# Patient Record
Sex: Male | Born: 1980 | Race: White | Hispanic: No | Marital: Married | Smoking: Never smoker
Health system: Southern US, Community
[De-identification: ages and names within clinical notes are randomized; demographics above are authoritative.]

## PROBLEM LIST (undated history)

## (undated) DIAGNOSIS — Z973 Presence of spectacles and contact lenses: Secondary | ICD-10-CM

## (undated) DIAGNOSIS — N200 Calculus of kidney: Secondary | ICD-10-CM

## (undated) DIAGNOSIS — N201 Calculus of ureter: Secondary | ICD-10-CM

## (undated) DIAGNOSIS — K219 Gastro-esophageal reflux disease without esophagitis: Secondary | ICD-10-CM

## (undated) DIAGNOSIS — Z87442 Personal history of urinary calculi: Secondary | ICD-10-CM

## (undated) DIAGNOSIS — E7201 Cystinuria: Secondary | ICD-10-CM

## (undated) HISTORY — PX: PERCUTANEOUS NEPHROSTOLITHOTOMY: SHX2207

## (undated) HISTORY — PX: OTHER SURGICAL HISTORY: SHX169

---

## 2001-06-16 ENCOUNTER — Ambulatory Visit (HOSPITAL_COMMUNITY): Admission: RE | Admit: 2001-06-16 | Discharge: 2001-06-16 | Payer: Self-pay | Admitting: Urology

## 2001-07-14 ENCOUNTER — Encounter: Payer: Self-pay | Admitting: Urology

## 2001-07-14 ENCOUNTER — Ambulatory Visit (HOSPITAL_COMMUNITY): Admission: RE | Admit: 2001-07-14 | Discharge: 2001-07-14 | Payer: Self-pay | Admitting: Urology

## 2001-12-01 ENCOUNTER — Encounter: Payer: Self-pay | Admitting: Urology

## 2001-12-01 ENCOUNTER — Ambulatory Visit (HOSPITAL_COMMUNITY): Admission: RE | Admit: 2001-12-01 | Discharge: 2001-12-01 | Payer: Self-pay | Admitting: Urology

## 2004-08-07 ENCOUNTER — Ambulatory Visit (HOSPITAL_COMMUNITY): Admission: RE | Admit: 2004-08-07 | Discharge: 2004-08-07 | Payer: Self-pay | Admitting: Urology

## 2016-01-25 ENCOUNTER — Ambulatory Visit: Payer: Self-pay | Admitting: Family Medicine

## 2017-06-10 ENCOUNTER — Emergency Department (HOSPITAL_COMMUNITY): Payer: 59

## 2017-06-10 ENCOUNTER — Other Ambulatory Visit: Payer: Self-pay | Admitting: Urology

## 2017-06-10 ENCOUNTER — Other Ambulatory Visit: Payer: Self-pay

## 2017-06-10 ENCOUNTER — Encounter (HOSPITAL_COMMUNITY): Payer: Self-pay | Admitting: Emergency Medicine

## 2017-06-10 ENCOUNTER — Emergency Department (HOSPITAL_COMMUNITY)
Admission: EM | Admit: 2017-06-10 | Discharge: 2017-06-10 | Disposition: A | Discharge status: Home / Self Care | Payer: 59 | Attending: Physician Assistant | Admitting: Physician Assistant

## 2017-06-10 DIAGNOSIS — R109 Unspecified abdominal pain: Secondary | ICD-10-CM | POA: Diagnosis present

## 2017-06-10 DIAGNOSIS — Z79899 Other long term (current) drug therapy: Secondary | ICD-10-CM | POA: Diagnosis not present

## 2017-06-10 DIAGNOSIS — N200 Calculus of kidney: Secondary | ICD-10-CM | POA: Diagnosis not present

## 2017-06-10 DIAGNOSIS — F17200 Nicotine dependence, unspecified, uncomplicated: Secondary | ICD-10-CM | POA: Diagnosis not present

## 2017-06-10 DIAGNOSIS — N23 Unspecified renal colic: Secondary | ICD-10-CM

## 2017-06-10 LAB — URINALYSIS, ROUTINE W REFLEX MICROSCOPIC
BILIRUBIN URINE: NEGATIVE
GLUCOSE, UA: NEGATIVE mg/dL
HGB URINE DIPSTICK: NEGATIVE
KETONES UR: NEGATIVE mg/dL
Leukocytes, UA: NEGATIVE
NITRITE: NEGATIVE
PH: 6 (ref 5.0–8.0)
Protein, ur: NEGATIVE mg/dL
Specific Gravity, Urine: 1.021 (ref 1.005–1.030)

## 2017-06-10 LAB — CBC WITH DIFFERENTIAL/PLATELET
Basophils Absolute: 0 10*3/uL (ref 0.0–0.1)
Basophils Relative: 0 %
Eosinophils Absolute: 0.1 10*3/uL (ref 0.0–0.7)
Eosinophils Relative: 2 %
HCT: 42.7 % (ref 39.0–52.0)
Hemoglobin: 14.9 g/dL (ref 13.0–17.0)
Lymphocytes Relative: 25 %
Lymphs Abs: 1.2 10*3/uL (ref 0.7–4.0)
MCH: 33.2 pg (ref 26.0–34.0)
MCHC: 34.9 g/dL (ref 30.0–36.0)
MCV: 95.1 fL (ref 78.0–100.0)
Monocytes Absolute: 0.5 10*3/uL (ref 0.1–1.0)
Monocytes Relative: 10 %
Neutro Abs: 3.1 10*3/uL (ref 1.7–7.7)
Neutrophils Relative %: 63 %
Platelets: 177 10*3/uL (ref 150–400)
RBC: 4.49 MIL/uL (ref 4.22–5.81)
RDW: 11.6 % (ref 11.5–15.5)
WBC: 4.9 10*3/uL (ref 4.0–10.5)

## 2017-06-10 LAB — BASIC METABOLIC PANEL
Anion gap: 8 (ref 5–15)
BUN: 16 mg/dL (ref 6–20)
CO2: 26 mmol/L (ref 22–32)
Calcium: 9 mg/dL (ref 8.9–10.3)
Chloride: 106 mmol/L (ref 101–111)
Creatinine, Ser: 0.9 mg/dL (ref 0.61–1.24)
GFR calc Af Amer: >60 mL/min (ref >60)
GFR calc non Af Amer: >60 mL/min (ref >60)
Glucose, Bld: 98 mg/dL (ref 65–99)
Potassium: 4.4 mmol/L (ref 3.5–5.1)
Sodium: 140 mmol/L (ref 135–145)

## 2017-06-10 MED ORDER — HYDROMORPHONE HCL 1 MG/ML IJ SOLN
0.5000 mg | Freq: Once | INTRAMUSCULAR | Status: AC
  Administered 2017-06-10: 0.5 mg via INTRAVENOUS
  Filled 2017-06-10: qty 1

## 2017-06-10 MED ORDER — OXYCODONE-ACETAMINOPHEN 5-325 MG PO TABS: 1.0000 | ORAL_TABLET | ORAL | 0 refills | Status: DC | PRN

## 2017-06-10 MED ORDER — KETOROLAC TROMETHAMINE 30 MG/ML IJ SOLN
30.0000 mg | Freq: Once | INTRAMUSCULAR | Status: AC
  Administered 2017-06-10: 30 mg via INTRAVENOUS
  Filled 2017-06-10: qty 1

## 2017-06-10 MED ORDER — ONDANSETRON HCL 4 MG PO TABS: 4.0000 mg | ORAL_TABLET | Freq: Three times a day (TID) | ORAL | 0 refills | Status: DC | PRN

## 2017-06-10 MED ORDER — ONDANSETRON 8 MG PO TBDP
8.0000 mg | ORAL_TABLET | Freq: Once | ORAL | Status: AC
  Administered 2017-06-10: 8 mg via ORAL
  Filled 2017-06-10: qty 1

## 2017-06-10 NOTE — Discharge Instructions (Signed)
You can take Tylenol or Ibuprofen as directed for pain. You can alternate Tylenol and Ibuprofen every 4 hours. If you take Tylenol at 1pm, then you can take Ibuprofen at 5pm. Then you can take Tylenol again at 9pm.   You can take the pain medication for severe ore breakthrough pain.  Urologist.  Call their office and arrange for an appointment.  Return the emergency department for any fevers, worsening pain, persistent vomiting, inability to eat or drink anything or any other worsening concerning symptoms.

## 2017-06-10 NOTE — ED Provider Notes (Signed)
[facility]-EMERGENCY DEPT Provider Note   CSN: [id number] Arrival date & time: 06/10/17  0605     History   Chief Complaint Chief Complaint  Patient presents with  . Flank Pain    HPI Devin Sanders is a 37 y.o. male [facility] who presents for evaluation of left lower quadrant and left flank pain that began last night.  Patient reports that he has a history of bilateral kidney stones and is followed by [facility] for evaluation.  He was seen 5 days ago for evaluation and is scheduled to have intervention in the next 2 weeks.  Patient reports that he was discharged home with hydrocodone which he states he has been taking as needed for pain.  Patient reports that hydrocodone has not been helping his pain.  He states that his pain worsened on the left side last night.  States that he is also feeling nauseous.  No vomiting.  Patient states that he was told he has a larger stone and has seen fragments of it breaking off.  Patient reports some uncomfortableness with urination but denies any hematuria.  Patient denies any fevers, chest pain, difficulty breathing.  The history is provided by the patient.    History reviewed. No pertinent past medical history.  There are no active problems to display for this patient.   Past Surgical History:  Procedure Laterality Date  . KIDNEY STONE SURGERY          Home Medications    Prior to Admission medications   Medication Sig Start Date End Date Taking? Authorizing Provider  potassium citrate (UROCIT-K) 10 MEQ (1080 MG) SR tablet Take 20 mEq by mouth 2 (two) times daily. 07/23/16 07/23/17 Yes [provider]  tiopronin (THIOLA) 100 MG tablet Take 5 tablets by mouth 2 (two) times daily. 07/23/16 07/23/17 Yes [provider]  ondansetron (ZOFRAN) 4 MG tablet Take 1 tablet (4 mg total) by mouth every 8 (eight) hours as needed for nausea or vomiting. 06/10/17   Maxwell Caul, PA-C    oxyCODONE-acetaminophen (PERCOCET/ROXICET) 5-325 MG tablet Take 1-2 tablets by mouth every 4 (four) hours as needed for severe pain. 06/10/17   Maxwell Caul, PA-C    Family History History reviewed. No pertinent family history.  Social History Social History   Tobacco Use  . Smoking status: Current Every Day Smoker  . Smokeless tobacco: Current User  Substance Use Topics  . Alcohol use: Never    Frequency: Never  . Drug use: Never     Allergies   Mangifera indica and Morphine   Review of Systems Review of Systems  Constitutional: Negative for fever.  Respiratory: Negative for shortness of breath.   Cardiovascular: Negative for chest pain.  Gastrointestinal: Positive for abdominal pain and nausea. Negative for vomiting.  Genitourinary: Positive for flank pain. Negative for dysuria and hematuria.  All other systems reviewed and are negative.    Physical Exam Updated Vital Signs BP (!) 125/96   Pulse 67   Temp 98.9 F (37.2 C) (Oral)   Resp 12   SpO2 95%   Physical Exam  Constitutional: He is oriented to person, place, and time. He appears well-developed and well-nourished.  Sitting comfortably on examination table  HENT:  Head: Normocephalic and atraumatic.  Mouth/Throat: Oropharynx is clear and moist and mucous membranes are normal.  Eyes: Pupils are equal, round, and reactive to light. Conjunctivae, EOM and lids are normal.  Neck: Full passive range of motion  without pain.  Cardiovascular: Normal rate, regular rhythm, normal heart sounds and normal pulses. Exam reveals no gallop and no friction rub.  No murmur heard. Pulmonary/Chest: Effort normal and breath sounds normal.  Lungs clear to auscultation bilaterally.  Symmetric chest rise.  No wheezing, rales, rhonchi.  Abdominal: Soft. Normal appearance. There is tenderness in the left lower quadrant. There is CVA tenderness (Left). There is no rigidity and no guarding.  Abdomen soft, nondistended.   Tenderness noted to the left lower quadrant.  Left-sided CVA tenderness.  Musculoskeletal: Normal range of motion.  Neurological: He is alert and oriented to person, place, and time.  Skin: Skin is warm and dry. Capillary refill takes less than 2 seconds.  Psychiatric: He has a normal mood and affect. His speech is normal.  Nursing note and vitals reviewed.    ED Treatments / Results  Labs (all labs ordered are listed, but only abnormal results are displayed) Labs Reviewed  URINALYSIS, ROUTINE W REFLEX MICROSCOPIC - Abnormal; Notable for the following components:      Result Value   APPearance HAZY (*)    All other components within normal limits  BASIC METABOLIC PANEL  CBC WITH DIFFERENTIAL/PLATELET    EKG None  Radiology Ct Renal Stone Study  Result Date: 06/10/2017 CLINICAL DATA:  Left flank region pain. Patient reports passing recent calculus fragments in urine. EXAM: CT ABDOMEN AND PELVIS WITHOUT CONTRAST TECHNIQUE: Multidetector CT imaging of the abdomen and pelvis was performed following the standard protocol without oral or IV contrast. COMPARISON:  April 07, 2017 FINDINGS: Lower chest: There is bibasilar atelectatic change. No consolidation in the lung bases. Hepatobiliary: No focal liver lesions are evident on this noncontrast enhanced study. Gallbladder wall is not appreciably thickened. There is no biliary duct dilatation. Pancreas: There is no pancreatic mass or inflammatory focus. Spleen: No splenic lesions are evident. Adrenals/Urinary Tract: Adrenals bilaterally appear normal. There is no appreciable mass or hydronephrosis on either side. There is mild scarring in the lower pole of the left kidney. On the right, there are several 1-2 mm calculi in the upper pole region. There is a calculus in the lower pole region on the right measuring 7 x 5 mm with an adjacent 4 x 3 mm calculus in the lower pole right kidney. On the left, there is a calculus in the upper pole region  measuring 11 x 4 mm with adjacent 4 x 4 mm calculus. There are several 1-2 mm calculi in the mid left kidney. In the lower pole left kidney, there is a calculus measuring 1.0 x 0.9 cm. There are also occasional tiny calculi elsewhere in the lower pole of the left kidney. There are no evident ureteral calculi on either side. Urinary bladder is midline with wall thickness within normal limits. Stomach/Bowel: There is no appreciable bowel wall or mesenteric thickening. No evident bowel obstruction. No free air or portal venous air. Vascular/Lymphatic: There is no abdominal aortic aneurysm. No vascular lesions are. No adenopathy is appreciable in the abdomen or pelvis. Reproductive: Prostate and seminal vesicles are normal in size and contour. There is no evident pelvic mass. Other: Appendix appears normal. No abscess or ascites is evident in the abdomen or pelvis. There is a small ventral hernia containing only fat. There is fat in each inguinal ring. Musculoskeletal: There are no blastic or lytic bone lesions. There is no intramuscular lesion evident. IMPRESSION: 1. Multiple intrarenal calculi bilaterally. No ureteral calculus or hydronephrosis on either side. 2. No bowel obstruction.  No evident abscess in the abdomen pelvis. Appendix appears normal. 3. Small ventral hernia containing only fat. Fat noted in each inguinal ring. Electronically Signed   By: Bretta Bang III M.D.   On: 06/10/2017 09:52    Procedures Procedures (including critical care time)  Medications Ordered in ED Medications  ketorolac (TORADOL) 30 MG/ML injection 30 mg (30 mg Intravenous Given 06/10/17 0744)  ondansetron (ZOFRAN-ODT) disintegrating tablet 8 mg (8 mg Oral Given 06/10/17 0842)  HYDROmorphone (DILAUDID) injection 0.5 mg (0.5 mg Intravenous Given 06/10/17 0844)  HYDROmorphone (DILAUDID) injection 0.5 mg (0.5 mg Intravenous Given 06/10/17 1101)     Initial Impression / Assessment and Plan / ED Course  I have reviewed the  triage vital signs and the nursing notes.  Pertinent labs & imaging results that were available during my care of the patient were reviewed by me and considered in my medical decision making (see chart for details).     37 y.o. M with PMH/o kidney stones who presents for evaluation of left lower quadrant pain and flank pain.  History of kidney stones and has been in pain but states worse last night.  Seen by urology 5 days ago and given hydrocodone which he states he has been taking has not been improving his pain.  No fevers.  Reports nausea but no vomiting. Patient is afebrile, non-toxic appearing, sitting comfortably on examination table. Vital signs reviewed and stable.  On exam, patient is tender in the left lower quadrant and left CVA.  Plan to check urine.  Will check basic labs to evaluate kidney function.  Analgesics provided in the department.  BMP unremarkable.  UA shows no infectious etiology.  CBC without any significant leukocytosis or anemia.  CT scan shows multiple stones within the kidney themselves.  No evidence of obstructing kidney stone.  No evidence of hydronephrosis.  CT study otherwise unremarkable.  I discussed results with patient.  On reevaluation, he states his pain is now on 8/10.  He has had one round of Toradol and 1 round of Dilaudid.  Patient still slightly tender on the left side.  We will plan an additional small dose of pain medication.  Patient reviewed on PMP.  Has multiple narcotic prescriptions.  Review of records show that most of this is related to his kidney stones.  Most of the prescriptions are from urology.  Question if there is some drug-seeking behavior associated with his symptoms though he does have extensive renal disease.  I discussed with him the expectation of controlling his pain.  Given his extensive kidney stones, will be able to slightly improve his pain but will not be able to come controlled altogether.  Patient is understandable of  this.  Reevaluation.  Patient reports some improvement in pain.  Repeat abdominal symptoms improvement in tenderness.  We will plan to p.o. challenge patient in the department.  Tolerated p.o. without any difficulties.  No vomiting here in the department.  While patient does have a significant amount of narcotic prescriptions, he does have significant stone disease.  We will plan to give just a small amount of Percocet since he feels like the hydrocodone is not working to help with pain.  Instructed him that I can only give him a small dose to help with pain and that until he is seen by urology, most likely be a temporary solution to his pain.  Patient understands.  Encourage urology follow-up in the next 2 to 4 days for further evaluation. Patient had  ample opportunity for questions and discussion. All patient's questions were answered with full understanding. Strict return precautions discussed. Patient expresses understanding and agreement to plan.    Final Clinical Impressions(s) / ED Diagnoses   Final diagnoses:  Renal colic    ED Discharge Orders        Ordered    ondansetron (ZOFRAN) 4 MG tablet  Every 8 hours PRN     06/10/17 1137    oxyCODONE-acetaminophen (PERCOCET/ROXICET) 5-325 MG tablet  Every 4 hours PRN     06/10/17 1137       Maxwell Caul, PA-C 06/10/17 1622    Mackuen, Cindee Salt, MD 06/11/17 [phone]

## 2017-06-10 NOTE — ED Triage Notes (Signed)
Pt reports left flank pain onset x1 week Hx kidney stones and states has been fragments since last friday

## 2017-06-22 ENCOUNTER — Encounter (HOSPITAL_BASED_OUTPATIENT_CLINIC_OR_DEPARTMENT_OTHER): Payer: Self-pay | Admitting: *Deleted

## 2017-06-22 ENCOUNTER — Other Ambulatory Visit: Payer: Self-pay

## 2017-06-22 NOTE — Progress Notes (Addendum)
Spoke w/ pt via phone for pre-op interview.  Npo after mn.  Arrive at 0530.  Needs istat 8.  Will take am meds dos w/ sips of water and if needed takes oxycodone/ zofran.

## 2017-06-26 ENCOUNTER — Ambulatory Visit (HOSPITAL_BASED_OUTPATIENT_CLINIC_OR_DEPARTMENT_OTHER): Payer: 59 | Admitting: Anesthesiology

## 2017-06-26 ENCOUNTER — Encounter (HOSPITAL_BASED_OUTPATIENT_CLINIC_OR_DEPARTMENT_OTHER)
Admission: RE | Disposition: A | Discharge status: Home / Self Care | Payer: Self-pay | Source: Ambulatory Visit | Attending: Urology

## 2017-06-26 ENCOUNTER — Other Ambulatory Visit: Payer: Self-pay

## 2017-06-26 ENCOUNTER — Encounter (HOSPITAL_BASED_OUTPATIENT_CLINIC_OR_DEPARTMENT_OTHER): Payer: Self-pay

## 2017-06-26 ENCOUNTER — Ambulatory Visit (HOSPITAL_BASED_OUTPATIENT_CLINIC_OR_DEPARTMENT_OTHER)
Admission: RE | Admit: 2017-06-26 | Discharge: 2017-06-26 | Disposition: A | Discharge status: Home / Self Care | Payer: 59 | Source: Ambulatory Visit | Attending: Urology | Admitting: Urology

## 2017-06-26 DIAGNOSIS — Z79899 Other long term (current) drug therapy: Secondary | ICD-10-CM | POA: Insufficient documentation

## 2017-06-26 DIAGNOSIS — N200 Calculus of kidney: Secondary | ICD-10-CM | POA: Diagnosis not present

## 2017-06-26 DIAGNOSIS — K219 Gastro-esophageal reflux disease without esophagitis: Secondary | ICD-10-CM | POA: Insufficient documentation

## 2017-06-26 HISTORY — DX: Gastro-esophageal reflux disease without esophagitis: K21.9

## 2017-06-26 HISTORY — PX: CYSTOSCOPY WITH RETROGRADE PYELOGRAM, URETEROSCOPY AND STENT PLACEMENT: SHX5789

## 2017-06-26 HISTORY — PX: HOLMIUM LASER APPLICATION: SHX5852

## 2017-06-26 HISTORY — DX: Personal history of urinary calculi: Z87.442

## 2017-06-26 HISTORY — DX: Calculus of kidney: N20.0

## 2017-06-26 HISTORY — DX: Presence of spectacles and contact lenses: Z97.3

## 2017-06-26 LAB — POCT I-STAT, CHEM 8
BUN: 21 mg/dL — ABNORMAL HIGH (ref 6–20)
Calcium, Ion: 1.32 mmol/L (ref 1.15–1.40)
Chloride: 104 mmol/L (ref 101–111)
Creatinine, Ser: 0.8 mg/dL (ref 0.61–1.24)
Glucose, Bld: 109 mg/dL — ABNORMAL HIGH (ref 65–99)
HEMATOCRIT: 39 % (ref 39.0–52.0)
HEMOGLOBIN: 13.3 g/dL (ref 13.0–17.0)
POTASSIUM: 3.8 mmol/L (ref 3.5–5.1)
Sodium: 141 mmol/L (ref 135–145)
TCO2: 23 mmol/L (ref 22–32)

## 2017-06-26 SURGERY — CYSTOURETEROSCOPY, WITH RETROGRADE PYELOGRAM AND STENT INSERTION
Anesthesia: General | Site: Renal | Laterality: Bilateral

## 2017-06-26 MED ORDER — PROPOFOL 10 MG/ML IV BOLUS
INTRAVENOUS | Status: DC | PRN
  Administered 2017-06-26: 200 mg via INTRAVENOUS

## 2017-06-26 MED ORDER — SODIUM CHLORIDE 0.9 % IR SOLN
Status: DC | PRN
  Administered 2017-06-26: 3000 mL

## 2017-06-26 MED ORDER — SUGAMMADEX SODIUM 200 MG/2ML IV SOLN
INTRAVENOUS | Status: AC
  Filled 2017-06-26: qty 2

## 2017-06-26 MED ORDER — DIPHENHYDRAMINE HCL 50 MG/ML IJ SOLN
INTRAMUSCULAR | Status: AC
  Filled 2017-06-26: qty 1

## 2017-06-26 MED ORDER — KETOROLAC TROMETHAMINE 10 MG PO TABS: 10.0000 mg | ORAL_TABLET | Freq: Three times a day (TID) | ORAL | 0 refills | Status: DC | PRN

## 2017-06-26 MED ORDER — HYDROMORPHONE HCL 1 MG/ML IJ SOLN
INTRAMUSCULAR | Status: AC
  Filled 2017-06-26: qty 1

## 2017-06-26 MED ORDER — ONDANSETRON HCL 4 MG/2ML IJ SOLN
INTRAMUSCULAR | Status: AC
  Filled 2017-06-26: qty 2

## 2017-06-26 MED ORDER — OXYCODONE-ACETAMINOPHEN 5-325 MG PO TABS
ORAL_TABLET | ORAL | Status: AC
  Filled 2017-06-26: qty 1

## 2017-06-26 MED ORDER — MIDAZOLAM HCL 2 MG/2ML IJ SOLN
0.5000 mg | Freq: Once | INTRAMUSCULAR | Status: DC | PRN
  Filled 2017-06-26: qty 2

## 2017-06-26 MED ORDER — SUCCINYLCHOLINE CHLORIDE 200 MG/10ML IV SOSY
PREFILLED_SYRINGE | INTRAVENOUS | Status: DC | PRN
  Administered 2017-06-26: 120 mg via INTRAVENOUS

## 2017-06-26 MED ORDER — ROCURONIUM BROMIDE 10 MG/ML (PF) SYRINGE
PREFILLED_SYRINGE | INTRAVENOUS | Status: DC | PRN
  Administered 2017-06-26: 50 mg via INTRAVENOUS
  Administered 2017-06-26: 20 mg via INTRAVENOUS

## 2017-06-26 MED ORDER — DEXAMETHASONE SODIUM PHOSPHATE 10 MG/ML IJ SOLN
INTRAMUSCULAR | Status: DC | PRN
  Administered 2017-06-26: 10 mg via INTRAVENOUS

## 2017-06-26 MED ORDER — FENTANYL CITRATE (PF) 100 MCG/2ML IJ SOLN
INTRAMUSCULAR | Status: AC
  Filled 2017-06-26: qty 2

## 2017-06-26 MED ORDER — DEXAMETHASONE SODIUM PHOSPHATE 10 MG/ML IJ SOLN
INTRAMUSCULAR | Status: AC
  Filled 2017-06-26: qty 1

## 2017-06-26 MED ORDER — KETOROLAC TROMETHAMINE 30 MG/ML IJ SOLN
INTRAMUSCULAR | Status: DC | PRN
  Administered 2017-06-26: 30 mg via INTRAVENOUS

## 2017-06-26 MED ORDER — PROMETHAZINE HCL 25 MG/ML IJ SOLN
6.2500 mg | INTRAMUSCULAR | Status: DC | PRN
  Filled 2017-06-26: qty 1

## 2017-06-26 MED ORDER — KETOROLAC TROMETHAMINE 30 MG/ML IJ SOLN
INTRAMUSCULAR | Status: AC
  Filled 2017-06-26: qty 1

## 2017-06-26 MED ORDER — MIDAZOLAM HCL 2 MG/2ML IJ SOLN
INTRAMUSCULAR | Status: AC
  Filled 2017-06-26: qty 2

## 2017-06-26 MED ORDER — OXYCODONE-ACETAMINOPHEN 5-325 MG PO TABS
1.0000 | ORAL_TABLET | Freq: Four times a day (QID) | ORAL | Status: DC | PRN
  Administered 2017-06-26: 1 via ORAL
  Filled 2017-06-26: qty 2

## 2017-06-26 MED ORDER — DIPHENHYDRAMINE HCL 50 MG/ML IJ SOLN
12.5000 mg | Freq: Once | INTRAMUSCULAR | Status: AC
  Administered 2017-06-26: 12.5 mg via INTRAVENOUS
  Filled 2017-06-26: qty 0.25

## 2017-06-26 MED ORDER — SUGAMMADEX SODIUM 200 MG/2ML IV SOLN
INTRAVENOUS | Status: DC | PRN
  Administered 2017-06-26: 200 mg via INTRAVENOUS

## 2017-06-26 MED ORDER — GENTAMICIN SULFATE 40 MG/ML IJ SOLN
430.0000 mg | INTRAMUSCULAR | Status: AC
  Administered 2017-06-26: 430 mg via INTRAVENOUS
  Filled 2017-06-26: qty 10.75

## 2017-06-26 MED ORDER — SUCCINYLCHOLINE CHLORIDE 200 MG/10ML IV SOSY
PREFILLED_SYRINGE | INTRAVENOUS | Status: AC
  Filled 2017-06-26: qty 10

## 2017-06-26 MED ORDER — OXYCODONE-ACETAMINOPHEN 5-325 MG PO TABS: 1.0000 | ORAL_TABLET | Freq: Four times a day (QID) | ORAL | 0 refills | Status: DC | PRN

## 2017-06-26 MED ORDER — FENTANYL CITRATE (PF) 100 MCG/2ML IJ SOLN
INTRAMUSCULAR | Status: DC | PRN
  Administered 2017-06-26 (×4): 50 ug via INTRAVENOUS

## 2017-06-26 MED ORDER — ROCURONIUM BROMIDE 100 MG/10ML IV SOLN
INTRAVENOUS | Status: AC
  Filled 2017-06-26: qty 1

## 2017-06-26 MED ORDER — LACTATED RINGERS IV SOLN
INTRAVENOUS | Status: DC
  Administered 2017-06-26 (×2): via INTRAVENOUS
  Filled 2017-06-26: qty 1000

## 2017-06-26 MED ORDER — ONDANSETRON HCL 4 MG/2ML IJ SOLN
INTRAMUSCULAR | Status: DC | PRN
  Administered 2017-06-26: 4 mg via INTRAVENOUS

## 2017-06-26 MED ORDER — CEPHALEXIN 500 MG PO CAPS: 500.0000 mg | ORAL_CAPSULE | Freq: Two times a day (BID) | ORAL | 0 refills | Status: DC

## 2017-06-26 MED ORDER — HYDROMORPHONE HCL 1 MG/ML IJ SOLN
0.2500 mg | INTRAMUSCULAR | Status: DC | PRN
  Administered 2017-06-26 (×2): 0.25 mg via INTRAVENOUS
  Filled 2017-06-26: qty 0.5

## 2017-06-26 MED ORDER — LIDOCAINE 2% (20 MG/ML) 5 ML SYRINGE
INTRAMUSCULAR | Status: DC | PRN
  Administered 2017-06-26: 100 mg via INTRAVENOUS

## 2017-06-26 MED ORDER — PROPOFOL 10 MG/ML IV BOLUS
INTRAVENOUS | Status: AC
  Filled 2017-06-26: qty 40

## 2017-06-26 MED ORDER — MEPERIDINE HCL 25 MG/ML IJ SOLN
6.2500 mg | INTRAMUSCULAR | Status: DC | PRN
Start: 2017-06-26 — End: 2017-06-26
  Filled 2017-06-26: qty 1

## 2017-06-26 MED ORDER — MIDAZOLAM HCL 2 MG/2ML IJ SOLN
INTRAMUSCULAR | Status: DC | PRN
  Administered 2017-06-26: 2 mg via INTRAVENOUS

## 2017-06-26 MED ORDER — IOHEXOL 300 MG/ML  SOLN
INTRAMUSCULAR | Status: DC | PRN
  Administered 2017-06-26: 30 mL

## 2017-06-26 MED ORDER — LIDOCAINE 2% (20 MG/ML) 5 ML SYRINGE
INTRAMUSCULAR | Status: AC
  Filled 2017-06-26: qty 5

## 2017-06-26 SURGICAL SUPPLY — 29 items
BAG DRAIN URO-CYSTO SKYTR STRL (DRAIN) ×2 IMPLANT
BAG DRN UROCATH (DRAIN) ×1
BASKET LASER NITINOL 1.9FR (BASKET) ×1 IMPLANT
BSKT STON RTRVL 120 1.9FR (BASKET) ×1
CATH INTERMIT  6FR 70CM (CATHETERS) ×1 IMPLANT
CLOTH BEACON ORANGE TIMEOUT ST (SAFETY) ×2 IMPLANT
FIBER LASER FLEXIVA 365 (UROLOGICAL SUPPLIES) IMPLANT
FIBER LASER TRAC TIP (UROLOGICAL SUPPLIES) ×1 IMPLANT
GLOVE BIO SURGEON STRL SZ7.5 (GLOVE) ×2 IMPLANT
GLOVE BIOGEL PI IND STRL 7.0 (GLOVE) IMPLANT
GLOVE BIOGEL PI INDICATOR 7.0 (GLOVE) ×1
GLOVE INDICATOR 7.0 STRL GRN (GLOVE) ×1 IMPLANT
GOWN STRL REUS W/TWL LRG LVL3 (GOWN DISPOSABLE) ×3 IMPLANT
GUIDEWIRE ANG ZIPWIRE 038X150 (WIRE) ×2 IMPLANT
GUIDEWIRE STR DUAL SENSOR (WIRE) ×3 IMPLANT
INFUSOR MANOMETER BAG 3000ML (MISCELLANEOUS) ×1 IMPLANT
IV NS 1000ML (IV SOLUTION)
IV NS 1000ML BAXH (IV SOLUTION) ×1 IMPLANT
IV NS IRRIG 3000ML ARTHROMATIC (IV SOLUTION) ×2 IMPLANT
KIT TURNOVER CYSTO (KITS) ×2 IMPLANT
MANIFOLD NEPTUNE II (INSTRUMENTS) ×2 IMPLANT
NS IRRIG 500ML POUR BTL (IV SOLUTION) ×4 IMPLANT
PACK CYSTO (CUSTOM PROCEDURE TRAY) ×2 IMPLANT
SHEATH URET ACCESS 12FR/35CM (UROLOGICAL SUPPLIES) ×1 IMPLANT
STENT POLARIS 5FRX26 (STENTS) ×2 IMPLANT
SYR 10ML LL (SYRINGE) ×2 IMPLANT
TUBE CONNECTING 12X1/4 (SUCTIONS) IMPLANT
TUBE FEEDING 8FR 16IN STR KANG (MISCELLANEOUS) IMPLANT
TUBING UROLOGY SET (TUBING) ×1 IMPLANT

## 2017-06-26 NOTE — Anesthesia Postprocedure Evaluation (Signed)
Anesthesia Post Note  Patient: Devin Sanders  Procedure(s) Performed: CYSTOSCOPY WITH RETROGRADE PYELOGRAM, URETEROSCOPY, STONE BASKETRY  AND STENT PLACEMENT (Bilateral Renal) HOLMIUM LASER APPLICATION (Bilateral Renal)     Patient location during evaluation: PACU Anesthesia Type: General Level of consciousness: awake and alert, oriented and patient cooperative Pain management: pain level controlled Vital Signs Assessment: post-procedure vital signs reviewed and stable Respiratory status: spontaneous breathing, nonlabored ventilation and respiratory function stable Cardiovascular status: blood pressure returned to baseline and stable Postop Assessment: no apparent nausea or vomiting Anesthetic complications: no    Last Vitals:  Vitals:   06/26/17 0945 06/26/17 1110  BP: 119/86 (!) 126/92  Pulse: 75 97  Resp: 14 16  Temp:  36.6 C  SpO2: 93% 98%    Last Pain:  Vitals:   06/26/17 1130  TempSrc:   PainSc: 4                  Bernice Mullin,E. Liylah Najarro

## 2017-06-26 NOTE — Op Note (Signed)
NAME: Devin, Sanders MEDICAL RECORD [id number] ACCOUNT [id number] DATE OF BIRTH:February 05, 1980 FACILITY: [facility] LOCATION: [facility] PHYSICIAN:Devin Georgia Tresa Moore, MD  OPERATIVE REPORT  DATE OF PROCEDURE:  06/26/2017  PREOPERATIVE DIAGNOSIS:  Bilateral renal stones cystinuria.  PROCEDURES PERFORMED:   1.  Bilateral cystoscopy, bilateral pyelograms, interpretation. 2.  Bilateral ureteroscopy with laser lithotripsy. 3.  Insertion of bilateral ureteral stents 5 x 26 Polaris with tether.  ESTIMATED BLOOD LOSS:  Nil.  COMPLICATIONS:  No complications.  DRAINS:  None.  SPECIMENS:  Bilateral renal stones for discarded.  FINDINGS: 1.  Bilateral papillary tip calcifications, total volume approximately 1 cm2 each side. 2.  Partial left mid calix infundibular stenosis.  This was a laser endopyelotomy and stone behind this and this was cleared. 3.  Distal placement bilateral ureteral stents proximally renal pelvis, distally in the bladder.  INDICATIONS:  The patient is a very pleasant and very compliant 37 year old gentleman with a history of severe cystinuria.  He is on maximal medical therapy.  He has had innumerable surgeries in the past related to this including percutaneous stone  surgery.  He is now on a protocol of periodic surveillance and preemptive ureteroscopy for ipsilateral stone volume greater than 1 cm.  He has met this criteria.  He has a vacation planned in several months and wished to be stone free prior and we agreed  that bilateral ureteroscopy was clearly warranted with the goal of stone free.  Informed consent was obtained and placed in medical record.  DESCRIPTION OF PROCEDURE:  The patient being Devin Sanders verified, procedure being bilateral ureteral stent with stimulation was confirmed.  Procedure timeout was performed and intravenous antibiotics administered.  General endotracheal anesthesia  was induced.  The patient was placed into a low lithotomy position.  A  sterile field was created by prepping and draping the patient's penis, perineum and proximal thighs using iodine.  Cystourethroscopy was performed with a 22-French rigid cystoscope  with offset lens.  Inspection of the anterior and posterior urethra were unremarkable.  Inspection of bladder revealed no diverticula, calcifications, lesions.  There was some crystal area noted consistent with known cystinuria.  The left ureteral  orifice was cannulated with a 6-French ureteral catheter and left retrograde pyelogram was obtained.  Left retrograde pyelogram demonstrated a single left ureter with single system left kidney.  No filling defects or narrowing noted.  A 0.038 ZIPwire was advanced to the lower pole, set aside as a safety wire.  Similarly, right retrograde pyelogram was  obtained.  Right retrograde pyelogram demonstrated a single right ureter, single system right kidney.  No filling defects or narrowing noted.  A 0.038 ZIPwire was advanced to lower pole set aside as a safety wire.  An 8-French feeding tube placed in the urinary  bladder with pressure released and a semirigid ureteroscopy was performed of the distal 4/5 of the right ureter alongside a separate sensor working wire and no mucosal abnormalities were found.  Then similar radiographs, were performed of the distal left  ureter in the distal 4/5 of it.  No mucosal abnormalities were found.  The semirigid scope was then exchanged for a 12 x 14, 38 cm ureteral access sheath to the level of the proximal left ureter using continuous fluoroscopic guidance and flexible  digital ureteroscopy performed the proximal left ureter and systematic inspection of the left kidney, including all calices x3.  There were multifocal papillary tip calcifications, mostly in the upper pole.  These were amenable to basket extraction using  an escape  basket and they were removed, set aside for discard.  There was a midpole, very narrowed scarified infundibulum and  laser endopyelotomy was performed of this using settings of 1 joule and 10 Hz with a noncontact of bleeding technique and this  infundibular scarring was opened up and there was quite a dominant calcification within this calix.  This was then ablated using settings of 0.3 joules and 30 Hz using a dusting technique and the stone was completely ablated into fragments less than 130  mm.  Following this maneuver, there was excellent hemostasis, complete resolution of all fragments larger than 130 mm.  We have achieved the goals of the left-sided surgery.  The access sheath was removed under continuous vision and no mucosal abnormalities were found  The  access sheath was then placed over the right sensor working wire to the level of the right proximal ureter using the continuous fluoroscopic guidance.  A flexible digital ureteroscopy was performed of the proximal right ureter.  A systematic inspection  of the right kidney removing all calices x3.  There was a small upper pole papillary tip calcification and 2 free floating lower pole calcifications.  Lower pole calcifications were larger. They were grasped in an escape basket and repositioned into the  upper pole calix and ablated using a dusting technique as per the left side.  Several smaller dominant fragments were generated.  These were removed using escape basket.  Following these maneuver, excellent hemostasis and complete resolution of all stone  fragments larger than 130 mm.  Access sheath was removed under continuous vision.  Given the bilateral nature of the procedure today, it was felt that brief interval stenting would be warranted with tethered stents.  As such, a new 5 x 26 Polaris type  stent was placed over the safety wire on the right side using fluoroscopic guidance.  Good proximal and distal point were noted.  Similarly, a separate 5 x 26 stent was placed over the left-sided safety wire using fluoroscopic guidance.  Good proximal  and distal  point were noted.  Tether was left in place x2, fashioned to the dorsum of the penis.  Procedure terminated.    The patient tolerated the procedure well and no immediate complications.  The patient was taken to [facility] in stable condition.  AN/NUANCE  D:06/26/2017 T:06/26/2017 JOB:[id number]

## 2017-06-26 NOTE — Transfer of Care (Signed)
  Last Vitals:  Vitals Value Taken Time  BP 125/85 06/26/2017  9:00 AM  Temp 36.6 C 06/26/2017  8:58 AM  Pulse 84 06/26/2017  9:05 AM  Resp 13 06/26/2017  9:05 AM  SpO2 96 % 06/26/2017  9:05 AM  Vitals shown include unvalidated device data.  Last Pain:  Vitals:   06/26/17 0858  TempSrc:   PainSc: 7       Patients Stated Pain Goal: 4 (06/26/17 [id number]) Immediate Anesthesia Transfer of Care Note  Patient: Devin Sanders  Procedure(s) Performed: Procedure(s) (LRB): CYSTOSCOPY WITH RETROGRADE PYELOGRAM, URETEROSCOPY, STONE BASKETRY  AND STENT PLACEMENT (Bilateral) HOLMIUM LASER APPLICATION (Bilateral)  Patient Location: PACU  Anesthesia Type: General  Level of Consciousness: awake, alert  and oriented  Airway & Oxygen Therapy: Patient Spontanous Breathing and Patient connected to nasal cannula oxygen  Post-op Assessment: Report given to PACU RN and Post -op Vital signs reviewed and stable  Post vital signs: Reviewed and stable  Complications: No apparent anesthesia complications

## 2017-06-26 NOTE — Anesthesia Procedure Notes (Signed)
Procedure Name: Intubation Date/Time: 06/26/2017 7:38 AM Performed by: Suan Halter, CRNA Pre-anesthesia Checklist: Patient identified, Emergency Drugs available, Suction available and Patient being monitored Patient Re-evaluated:Patient Re-evaluated prior to induction Oxygen Delivery Method: Circle system utilized Preoxygenation: Pre-oxygenation with 100% oxygen Induction Type: IV induction Ventilation: Mask ventilation without difficulty Laryngoscope Size: Mac and 3 Grade View: Grade I Tube type: Oral Tube size: 7.5 mm Number of attempts: 1 Airway Equipment and Method: Stylet and Oral airway Placement Confirmation: ETT inserted through vocal cords under direct vision,  positive ETCO2 and breath sounds checked- equal and bilateral Secured at: 23 cm Tube secured with: Tape Dental Injury: Teeth and Oropharynx as per pre-operative assessment

## 2017-06-26 NOTE — Discharge Instructions (Signed)
1 - You may have urinary urgency (bladder spasms) and bloody urine on / off with stent in place. This is normal. ° °2 - Remove tethered stent on Monday morning at home by pulling on string, then blue-white plastic tubing, and discarding. Office is open Monday if any problems arise.  ° °3 - Call MD or go to ER for fever >102, severe pain / nausea / vomiting not relieved by medications, or acute change in medical status ° °[facility] °[phone] °Post Ureteroscopy With or Without Stent Instructions ° °Definitions: ° °Ureter: The duct that transports urine from the kidney to the bladder. °Stent:   A plastic hollow tube that is placed into the ureter, from the kidney to the                 bladder to prevent the ureter from swelling shut. ° °GENERAL INSTRUCTIONS: ° °Despite the fact that no skin incisions were used, the area around the ureter and bladder is raw and irritated. The stent is a foreign body which will further irritate the bladder wall. This irritation is manifested by increased frequency of urination, both day and night, and by an increase in the urge to urinate. In some, the urge to urinate is present almost always. Sometimes the urge is strong enough that you may not be able to stop yourself from urinating. The only real cure is to remove the stent and then give time for the bladder wall to heal which can't be done until the danger of the ureter swelling shut has passed, which varies. ° °You may see some blood in your urine while the stent is in place and a few days afterwards. Do not be alarmed, even if the urine was clear for a while. Get off your feet and drink lots of fluids until clearing occurs. If you start to pass clots or don't improve, call us. ° °DIET: °You may return to your normal diet immediately. Because of the raw surface of your bladder, alcohol, spicy foods, acid type foods and drinks with caffeine may cause irritation or frequency and should be used in moderation. To  keep your urine flowing freely and to avoid constipation, drink plenty of fluids during the day ( 8-10 glasses ). °Tip: Avoid cranberry juice because it is very acidic. ° °ACTIVITY: °Your physical activity doesn't need to be restricted. However, if you are very active, you may see some blood in your urine. We suggest that you reduce your activity under these circumstances until the bleeding has stopped. ° °BOWELS: °It is important to keep your bowels regular during the postoperative period. Straining with bowel movements can cause bleeding. A bowel movement every other day is reasonable. Use a mild laxative if needed, such as Milk of Magnesia 2-3 tablespoons, or 2 Dulcolax tablets. Call if you continue to have problems. If you have been taking narcotics for pain, before, during or after your surgery, you may be constipated. Take a laxative if necessary. ° ° °MEDICATION: °You should resume your pre-surgery medications unless told not to. In addition you will often be given an antibiotic to prevent infection. These should be taken as prescribed until the bottles are finished unless you are having an unusual reaction to one of the drugs. ° °PROBLEMS YOU SHOULD REPORT TO US: °· Fevers over 100.5 Fahrenheit. °· Heavy bleeding, or clots ( See above notes about blood in urine ). °· Inability to urinate. °· Drug reactions ( hives, rash, nausea, vomiting, diarrhea ). °·   Severe burning or pain with urination that is not improving. ° °FOLLOW-UP: °You will need a follow-up appointment to monitor your progress. Call for this appointment at the number listed above. Usually the first appointment will be about three to fourteen days after your surgery. ° ° °Post Anesthesia Home Care Instructions ° °Activity: °Get plenty of rest for the remainder of the day. A responsible individual must stay with you for 24 hours following the procedure.  °For the next 24 hours, DO NOT: °-Drive a car °-Operate machinery °-Drink alcoholic  beverages °-Take any medication unless instructed by your physician °-Make any legal decisions or sign important papers. ° °Meals: °Start with liquid foods such as gelatin or soup. Progress to regular foods as tolerated. Avoid greasy, spicy, heavy foods. If nausea and/or vomiting occur, drink only clear liquids until the nausea and/or vomiting subsides. Call your physician if vomiting continues. ° °Special Instructions/Symptoms: °Your throat may feel dry or sore from the anesthesia or the breathing tube placed in your throat during surgery. If this causes discomfort, gargle with warm salt water. The discomfort should disappear within 24 hours. ° °If you had a scopolamine patch placed behind your ear for the management of post- operative nausea and/or vomiting: ° °1. The medication in the patch is effective for 72 hours, after which it should be removed.  Wrap patch in a tissue and discard in the trash. Wash hands thoroughly with soap and water. °2. You may remove the patch earlier than 72 hours if you experience unpleasant side effects which may include dry mouth, dizziness or visual disturbances. °3. Avoid touching the patch. Wash your hands with soap and water after contact with the patch. ° °

## 2017-06-26 NOTE — H&P (Signed)
Devin Sanders is an 37 y.o. male.    Chief Complaint: Pre-op BILATERAL Ureteroscopic Stone Manipulaiton  HPI:   1 - Recurrent Nephrolithiasis -  Pre 2019 - PCNL several each side, [facility].  02/2017 - left URS for 1.5cm stone to stone reduction (not stone free) [facility] per OR notes  06/2017 - Lt > Rt scattere non-obstructing renal sotnes, abotu 1cm vol each side.   2 - Cysteinuria - ON thiola 500mg  BID / day PLUS K-Cit 2000MEQ BID meals for cysteinuria. CMP, CBC 02/2017 normal, Cr 0.9.   PMH sig for mild obesity. 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 His PCP is Vernon Preyon Moore MD.    Today "Devin Sanders" is seen to proceed with BILATERAL ureteroscopic stone manipulation with goal of stone free prior to planned vacations next month. Most recent UCX negaive.    Past Medical History:  Diagnosis Date  . GERD (gastroesophageal reflux disease)   . History of kidney stones   . Renal calculi    bilateral   . Wears glasses     Past Surgical History:  Procedure Laterality Date  . PERCUTANEOUS NEPHROSTOLITHOTOMY  2003;  2009;  06[facility]22-[facility]  . URETEROSCOPIC STONE MANIPULATION UNILATERAL  multiple since 1997--  last one 02-19-2017  [facility] by [email or web address]    History reviewed. No pertinent family history. Social History:  reports that he has never smoked. He has never used smokeless tobacco. He reports that he does not drink alcohol or use drugs.  Allergies:  Allergies  Allergen Reactions  . Morphine Nausea And Vomiting  . Other Hives and Swelling    MANGO'S    Medications Prior to Admission  Medication Sig Dispense Refill  . famotidine (PEPCID AC) 10 MG chewable tablet Chew 10 mg by mouth 2 (two) times daily as needed for heartburn.    Marland Kitchen. HYDROCODONE BITARTRATE PO Take 325 mg by mouth.    . ondansetron (ZOFRAN) 4 MG tablet Take 1 tablet (4 mg total) by mouth every 8 (eight) hours as needed for nausea or vomiting. 4 tablet  0  . oxyCODONE-acetaminophen (PERCOCET/ROXICET) 5-325 MG tablet Take 1-2 tablets by mouth every 4 (four) hours as needed for severe pain. 4 tablet 0  . potassium citrate (UROCIT-K) 10 MEQ (1080 MG) SR tablet Take 20 mEq by mouth 2 (two) times daily.    Marland Kitchen. tiopronin (THIOLA) 100 MG tablet Take 5 tablets by mouth 2 (two) times daily.      Results for orders placed or performed during the hospital encounter of 06/26/17 (from the past 48 hour(s))  I-STAT, chem 8     Status: Abnormal   Collection Time: 06/26/17  6:17 AM  Result Value Ref Range   Sodium 141 135 - 145 mmol/L   Potassium 3.8 3.5 - 5.1 mmol/L   Chloride 104 101 - 111 mmol/L   BUN 21 (H) 6 - 20 mg/dL   Creatinine, Ser 1.610.80 0.61 - 1.24 mg/dL   Glucose, Bld 096109 (H) 65 - 99 mg/dL   Calcium, Ion 0.451.32 4.091.15 - 1.40 mmol/L   TCO2 23 22 - 32 mmol/L   Hemoglobin 13.3 13.0 - 17.0 g/dL   HCT 81.139.0 91.439.0 - 78.252.0 %   No results found.  Review of Systems  Constitutional: Negative.  Negative for chills and fever.  HENT: Negative.   Eyes: Negative.   Respiratory: Negative.   Cardiovascular: Negative.   Gastrointestinal: Negative.  Genitourinary: Negative.   Musculoskeletal: Negative.   Skin: Negative.   Neurological: Negative.   Endo/Heme/Allergies: Negative.     Blood pressure (!) 114/98, pulse 75, temperature 97.7 F (36.5 C), temperature source Oral, resp. rate 18, height 5\' 10"  (1.778 m), weight 104.1 kg (229 lb 6.4 oz), SpO2 99 %. Physical Exam  HENT:  Head: Normocephalic.  Eyes: Pupils are equal, round, and reactive to light.  Neck: Normal range of motion.  Cardiovascular: Normal rate.  Respiratory: Effort normal.  GI: Soft.  Genitourinary:  Genitourinary Comments: Prior flank scars.   Musculoskeletal: Normal range of motion.  Neurological: He is alert.  Skin: Skin is warm.  Psychiatric: He has a normal mood and affect.     Assessment/Plan  Proceed as planned with BILATERAL ureteroscopic stone manipulation. Risks,  benefits, alternatives, expected peri-op course discussed preiovulsy and reiterated today.   [name], MD 06/26/2017, 6:54 AM

## 2017-06-26 NOTE — Anesthesia Preprocedure Evaluation (Addendum)
Anesthesia Evaluation  Patient identified by MRN, date of birth, ID band Patient awake    Reviewed: Allergy & Precautions, NPO status , Patient's Chart, lab work & pertinent test results  History of Anesthesia Complications Negative for: history of anesthetic complications  Airway Mallampati: II  TM Distance: >3 FB Neck ROM: Full    Dental  (+) Dental Advisory Given   Pulmonary neg pulmonary ROS,    breath sounds clear to auscultation       Cardiovascular negative cardio ROS   Rhythm:Regular Rate:Normal     Neuro/Psych negative neurological ROS     GI/Hepatic Neg liver ROS, GERD  Medicated and Controlled,Nauseated today, last emesis 4-5d ago with stones   Endo/Other  Morbid obesity  Renal/GU stones     Musculoskeletal   Abdominal (+) + obese,   Peds  Hematology negative hematology ROS (+)   Anesthesia Other Findings   Reproductive/Obstetrics                            Anesthesia Physical Anesthesia Plan  ASA: II  Anesthesia Plan: General   Post-op Pain Management:    Induction:   PONV Risk Score and Plan: 2 and Ondansetron and Dexamethasone  Airway Management Planned: Oral ETT  Additional Equipment:   Intra-op Plan:   Post-operative Plan: Extubation in OR  Informed Consent: I have reviewed the patients History and Physical, chart, labs and discussed the procedure including the risks, benefits and alternatives for the proposed anesthesia with the patient or authorized representative who has indicated his/her understanding and acceptance.   Dental advisory given  Plan Discussed with: CRNA and Surgeon  Anesthesia Plan Comments: (Plan routine monitors, GETA)        Anesthesia Quick Evaluation

## 2017-06-26 NOTE — Brief Op Note (Signed)
06/26/2017  8:47 AM  PATIENT:  Devin Sanders  37 y.o. male  PRE-OPERATIVE DIAGNOSIS:  BILATERAL RENAL STONES  POST-OPERATIVE DIAGNOSIS:  BILATERAL RENAL STONES  PROCEDURE:  Procedure(s): CYSTOSCOPY WITH RETROGRADE PYELOGRAM, URETEROSCOPY, STONE BASKETRY  AND STENT PLACEMENT (Bilateral) HOLMIUM LASER APPLICATION (Bilateral)  SURGEON:  Surgeon(s) and Role:    [name]* Alison Kubicki, MD - Primary  PHYSICIAN ASSISTANT:   ASSISTANTS: none   ANESTHESIA:   general  EBL:  minimal   BLOOD ADMINISTERED:none  DRAINS: none   LOCAL MEDICATIONS USED:  NONE  SPECIMEN:  Source of Specimen:  bilateral renal stone fragments  DISPOSITION OF SPECIMEN:  discard  COUNTS:  YES  TOURNIQUET:  * No tourniquets in log *  DICTATION: .Other Dictation: Dictation Number [id number]  PLAN OF CARE: Discharge to home after PACU  PATIENT DISPOSITION:  PACU - hemodynamically stable.   Delay start of Pharmacological VTE agent (>24hrs) due to surgical blood loss or risk of bleeding: yes

## 2017-06-29 ENCOUNTER — Other Ambulatory Visit: Payer: Self-pay

## 2017-06-29 ENCOUNTER — Emergency Department (HOSPITAL_COMMUNITY)
Admission: EM | Admit: 2017-06-29 | Discharge: 2017-06-29 | Disposition: A | Discharge status: Home / Self Care | Payer: 59 | Attending: Emergency Medicine | Admitting: Emergency Medicine

## 2017-06-29 ENCOUNTER — Encounter (HOSPITAL_COMMUNITY): Payer: Self-pay | Admitting: Emergency Medicine

## 2017-06-29 DIAGNOSIS — R109 Unspecified abdominal pain: Secondary | ICD-10-CM | POA: Diagnosis not present

## 2017-06-29 DIAGNOSIS — G8918 Other acute postprocedural pain: Secondary | ICD-10-CM | POA: Diagnosis present

## 2017-06-29 DIAGNOSIS — Z9889 Other specified postprocedural states: Secondary | ICD-10-CM | POA: Diagnosis not present

## 2017-06-29 DIAGNOSIS — Z79899 Other long term (current) drug therapy: Secondary | ICD-10-CM | POA: Insufficient documentation

## 2017-06-29 LAB — CBC
HEMATOCRIT: 39.9 % (ref 39.0–52.0)
HEMOGLOBIN: 13.8 g/dL (ref 13.0–17.0)
MCH: 32.9 pg (ref 26.0–34.0)
MCHC: 34.6 g/dL (ref 30.0–36.0)
MCV: 95.2 fL (ref 78.0–100.0)
Platelets: 224 10*3/uL (ref 150–400)
RBC: 4.19 MIL/uL — ABNORMAL LOW (ref 4.22–5.81)
RDW: 11.6 % (ref 11.5–15.5)
WBC: 7.3 10*3/uL (ref 4.0–10.5)

## 2017-06-29 LAB — URINALYSIS, ROUTINE W REFLEX MICROSCOPIC
BACTERIA UA: NONE SEEN
Bilirubin Urine: NEGATIVE
Glucose, UA: NEGATIVE mg/dL
KETONES UR: NEGATIVE mg/dL
Leukocytes, UA: NEGATIVE
Nitrite: NEGATIVE
Protein, ur: 100 mg/dL — AB
RBC / HPF: >50 RBC/hpf — ABNORMAL HIGH (ref 0–5)
SPECIFIC GRAVITY, URINE: 1.015 (ref 1.005–1.030)
pH: 6 (ref 5.0–8.0)

## 2017-06-29 LAB — BASIC METABOLIC PANEL
ANION GAP: 8 (ref 5–15)
BUN: 19 mg/dL (ref 6–20)
CHLORIDE: 107 mmol/L (ref 101–111)
CO2: 25 mmol/L (ref 22–32)
Calcium: 8.8 mg/dL — ABNORMAL LOW (ref 8.9–10.3)
Creatinine, Ser: 0.97 mg/dL (ref 0.61–1.24)
GFR calc non Af Amer: >60 mL/min (ref >60)
GLUCOSE: 102 mg/dL — AB (ref 65–99)
POTASSIUM: 3.8 mmol/L (ref 3.5–5.1)
Sodium: 140 mmol/L (ref 135–145)

## 2017-06-29 MED ORDER — ONDANSETRON HCL 4 MG/2ML IJ SOLN
4.0000 mg | Freq: Once | INTRAMUSCULAR | Status: AC
  Administered 2017-06-29: 4 mg via INTRAVENOUS
  Filled 2017-06-29: qty 2

## 2017-06-29 MED ORDER — OXYCODONE-ACETAMINOPHEN 5-325 MG PO TABS: 1.0000 | ORAL_TABLET | Freq: Four times a day (QID) | ORAL | 0 refills | Status: DC | PRN

## 2017-06-29 MED ORDER — HYDROMORPHONE HCL 1 MG/ML IJ SOLN
0.5000 mg | Freq: Once | INTRAMUSCULAR | Status: AC
  Administered 2017-06-29: 0.5 mg via INTRAVENOUS
  Filled 2017-06-29: qty 1

## 2017-06-29 MED ORDER — KETOROLAC TROMETHAMINE 15 MG/ML IJ SOLN
15.0000 mg | Freq: Once | INTRAMUSCULAR | Status: AC
Start: 2017-06-29 — End: 2017-06-29
  Administered 2017-06-29: 15 mg via INTRAVENOUS
  Filled 2017-06-29: qty 1

## 2017-06-29 MED ORDER — ONDANSETRON HCL 4 MG PO TABS: 4.0000 mg | ORAL_TABLET | Freq: Three times a day (TID) | ORAL | 0 refills | Status: DC | PRN

## 2017-06-29 NOTE — ED Provider Notes (Signed)
[facility]-EMERGENCY DEPT Provider Note   CSN: [id number] Arrival date & time: 06/29/17  [id number]     History   Chief Complaint Chief Complaint  Patient presents with  . Post-op Problem    HPI Devin Sanders is a 37 y.o. male.  HPI   Patient is a 37 year old male with a history of renal calculi and GERD presenting for postoperative pain and bleeding after removal of his ureteral stents.  Patient reports that he had bilateral ureteral stents placed on 06-26-2017, he was otherwise recovering well over the weekend, however he took them out per scheduled around 11:30 PM last night, and subsequently developed worsening colicky left flank pain and lower quadrant pain, as well as passage of large clots of blood per the urethra.  Patient reports he is still urinating, however he feels that his stream is lessening.  Patient denies any fever or chills.  Patient does report he is been unable to keep oral analgesia down due to nausea vomiting, and estimates approximately 10 times emesis in the past 12 hours.  Past Medical History:  Diagnosis Date  . GERD (gastroesophageal reflux disease)   . History of kidney stones   . Renal calculi    bilateral   . Wears glasses     There are no active problems to display for this patient.   Past Surgical History:  Procedure Laterality Date  . CYSTOSCOPY WITH RETROGRADE PYELOGRAM, URETEROSCOPY AND STENT PLACEMENT Bilateral 06/26/2017   Procedure: CYSTOSCOPY WITH RETROGRADE PYELOGRAM, URETEROSCOPY, STONE BASKETRY  AND STENT PLACEMENT;  Surgeon: Sebastian AcheManny, Theodore, MD;  [facility] [place];  Service: Urology;  Laterality: Bilateral;  . HOLMIUM LASER APPLICATION Bilateral 06/26/2017   Procedure: HOLMIUM LASER APPLICATION;  Surgeon: Sebastian AcheManny, Theodore, MD;  [facility] [place];  Service: Urology;  Laterality: Bilateral;  . PERCUTANEOUS NEPHROSTOLITHOTOMY  2003;  2009;  06-22-2014  [facility]  . URETEROSCOPIC STONE  MANIPULATION UNILATERAL  multiple since 1997--  last one 02-19-2017  @ [facility] by dr [email or web address]        Home Medications    Prior to Admission medications   Medication Sig Start Date End Date Taking? Authorizing Provider  cephALEXin (KEFLEX) 500 MG capsule Take 1 capsule (500 mg total) by mouth 2 (two) times daily. X 3 days to prevent infection with tethered stent 06/26/17   Sebastian AcheManny, Theodore, MD  famotidine (PEPCID AC) 10 MG chewable tablet Chew 10 mg by mouth 2 (two) times daily as needed for heartburn.    [provider]  ketorolac (TORADOL) 10 MG tablet Take 1 tablet (10 mg total) by mouth every 8 (eight) hours as needed for moderate pain. Or stent discomfort post-operatively. Pt has tolerated prior. 06/26/17   Sebastian AcheManny, Theodore, MD  ondansetron (ZOFRAN) 4 MG tablet Take 1 tablet (4 mg total) by mouth every 8 (eight) hours as needed for nausea or vomiting. 06/10/17   Maxwell CaulLayden, Lindsey A, PA-C  oxyCODONE-acetaminophen (PERCOCET/ROXICET) 5-325 MG tablet Take 1-2 tablets by mouth every 6 (six) hours as needed for severe pain. Post-operatively 06/26/17   Sebastian AcheManny, Theodore, MD  potassium citrate (UROCIT-K) 10 MEQ (1080 MG) SR tablet Take 20 mEq by mouth 2 (two) times daily. 07/23/16 07/23/17  [provider]  tiopronin (THIOLA) 100 MG tablet Take 5 tablets by mouth 2 (two) times daily. 07/23/16 07/23/17  [provider]    Family History History reviewed. No pertinent family history.  Social History Social History   Tobacco Use  . Smoking status:  Never Smoker  . Smokeless tobacco: Never Used  Substance Use Topics  . Alcohol use: Never    Frequency: Never  . Drug use: Never     Allergies   Morphine and Other   Review of Systems Review of Systems  Constitutional: Negative for chills and fever.  Gastrointestinal: Positive for abdominal pain, nausea and vomiting.  Genitourinary: Positive for decreased urine volume, dysuria, flank pain and hematuria. Negative for  frequency and urgency.  All other systems reviewed and are negative.    Physical Exam Updated Vital Signs BP (!) 151/109   Pulse 93   Temp 98 F (36.7 C) (Oral)   Resp 14   Ht 5\' 10"  (1.778 m)   Wt 103.9 kg (229 lb)   SpO2 94%   BMI 32.86 kg/m   Physical Exam  Constitutional: He appears well-developed and well-nourished. No distress.  HENT:  Head: Normocephalic and atraumatic.  Mouth/Throat: Oropharynx is clear and moist.  Eyes: Pupils are equal, round, and reactive to light. Conjunctivae and EOM are normal.  Neck: Normal range of motion. Neck supple.  Cardiovascular: Normal rate, regular rhythm, S1 normal and S2 normal.  No murmur heard. Pulmonary/Chest: Effort normal and breath sounds normal. He has no wheezes. He has no rales.  Abdominal: Soft. He exhibits no distension. There is tenderness. There is no guarding.  +CVA TTP; + LLQ tenderness without guarding or rebound.  Musculoskeletal: Normal range of motion. He exhibits no edema or deformity.  Lymphadenopathy:    He has no cervical adenopathy.  Neurological: He is alert.  Cranial nerves grossly intact. Patient moves extremities symmetrically and with good coordination.  Skin: Skin is warm and dry. No rash noted. No erythema.  Psychiatric: He has a normal mood and affect. His behavior is normal. Judgment and thought content normal.  Nursing note and vitals reviewed.    ED Treatments / Results  Labs (all labs ordered are listed, but only abnormal results are displayed) Labs Reviewed  BASIC METABOLIC PANEL - Abnormal; Notable for the following components:      Result Value   Glucose, Bld 102 (*)    Calcium 8.8 (*)    All other components within normal limits  CBC - Abnormal; Notable for the following components:   RBC 4.19 (*)    All other components within normal limits  URINALYSIS, ROUTINE W REFLEX MICROSCOPIC - Abnormal; Notable for the following components:   Color, Urine RED (*)    APPearance HAZY (*)      Hgb urine dipstick LARGE (*)    Protein, ur 100 (*)    RBC / HPF >50 (*)    All other components within normal limits    EKG None  Radiology No results found.  Procedures Procedures (including critical care time)  Medications Ordered in ED Medications  HYDROmorphone (DILAUDID) injection 0.5 mg (0.5 mg Intravenous Given 06/29/17 0659)  ondansetron (ZOFRAN) injection 4 mg (4 mg Intravenous Given 06/29/17 0659)     Initial Impression / Assessment and Plan / ED Course  I have reviewed the triage vital signs and the nursing notes.  Pertinent labs & imaging results that were available during my care of the patient were reviewed by me and considered in my medical decision making (see chart for details).  Clinical Course as of Jun 29 1008  Mon Jun 29, 2017  0750 Normal renal function. No leukocytosis.    [AM]  [id number] Per discussion with Dr. Vernie Ammons of urology, patient is having expected pain  of removal of stents, and could be passing parts of the stones that were surgically manipulated.  Patient should be treated with typical kidney stone therapy with antiemetics, analgesia, and Flomax.  Did not feel that further imaging would be prudent at this time. Patient to follow-up per previously scheduled appointment with urology.   [AM]  1003 I have reviewed the patient's information in the [place] Controlled Substance Database for the past 12 months and found them to have last Rx on 6/21 for 2 days. Additional 1 day Rx given for change in acute postoperative pain.  Opiates were prescribed for an acute, painful condition. The patient was given information on side effects and encouraged to use other, non-opiate pain medication primary, only using opiate medicine sparingly for severe pain.   [AM]    Clinical Course User Index [AM] Elisha Ponder, PA-C    Patient is nontoxic-appearing, afebrile, and in no acute distress at rest.  Patient with recurrent flank and lower abdominal pain  that he describes as identical to prior nephrolithiasis pain in the setting of removal of stents.  Pending normal renal function and hemoglobin, will consult urology.  Patient with improved pain reassessments.  Patient was informed of the plan of care as suggested by urology.  Patient provided return precautions for any intractable nausea or vomiting, worsening pain, or obstructive urination.  Patient is in understanding and agrees with the plan of care.  Final Clinical Impressions(s) / ED Diagnoses   Final diagnoses:  Left flank pain  History of removal of ureteral stent    ED Discharge Orders        Ordered    ondansetron (ZOFRAN) 4 MG tablet  Every 8 hours PRN     06/29/17 1006    oxyCODONE-acetaminophen (PERCOCET/ROXICET) 5-325 MG tablet  Every 6 hours PRN     06/29/17 1006       Elisha Ponder, PA-C 06/29/17 1011    Little, Ambrose Finland, MD 06/30/17 [phone]

## 2017-06-29 NOTE — ED Triage Notes (Signed)
Patient is complaining of blood clots and pain after pulling stents last night.

## 2017-06-29 NOTE — Discharge Instructions (Signed)
Please read and follow all provided instructions.  Your diagnoses today include:  1. Left flank pain   2. History of removal of ureteral stent     Tests performed today include: Urine test that showed blood in your urine and no infection Blood test that showed normal kidney function Vital signs. See below for your results today.   Medications prescribed:   Take any prescribed medications only as directed.  You have been prescribed Percocet for pain. This is an opioid pain medication. You may take this medication every 4-6 hours as needed for pain. Only take this medication if you need it for breakthrough pain. You may combine this medicine with ibuprofen, a non-steroidal anti-inflammatory drug (NSAID) every 6 hours, so you are getting something for pain relief every 3 hours.  Do not combine this medication with Tylenol, as it may increase the risk of liver problems.  Do not combine this medication with alcohol.  Please be advised to avoid driving or operating heavy machinery while taking this medication, as it may make you drowsy or impair judgment.   Please take your Flomax daily.  Please take Zofran oral disintegrating tablet every 6-8 hours as needed for nausea and vomiting.  Home care instructions:  Follow any educational materials contained in this packet.  BE VERY CAREFUL not to take multiple medicines containing Tylenol (also called acetaminophen). Doing so can lead to an overdose which can damage your liver and cause liver failure and possibly death.   Follow-up instructions: Please follow-up with your urologist or the urologist referral (provided on front page) in the next 1 week for further evaluation of your symptoms.  If you need to return to the Emergency Department, [facility] to [facility] and not [facility]. The [facility]rologists are [facility] at [facility] and can better care for you at this location.  Return instructions:  If you need to return to the  Emergency Department, go to [facility] and [facility]. The [facility]rologists are located at [facility] and can better care for you at this location.  Please return to the Emergency Department if you experience worsening symptoms.  Please return if you develop fever or uncontrolled pain or vomiting. Please return for any  Please return if you have any other emergent concerns.  Additional Information:  Your vital signs today were: BP (!) 157/108    Pulse 86    Temp 98 F (36.7 C) (Oral)    Resp 14    Ht 5\' 10"  (1.778 m)    Wt 103.9 kg (229 lb)    SpO2 95%    BMI 32.86 kg/m  If your blood pressure (BP) was elevated above 135/85 this visit, please have this repeated by your doctor within one month. --------------

## 2017-09-17 ENCOUNTER — Emergency Department (HOSPITAL_COMMUNITY)
Admission: EM | Admit: 2017-09-17 | Discharge: 2017-09-17 | Disposition: A | Discharge status: Home / Self Care | Payer: 59 | Source: Home / Self Care | Attending: Emergency Medicine | Admitting: Emergency Medicine

## 2017-09-17 ENCOUNTER — Emergency Department (HOSPITAL_COMMUNITY): Payer: 59

## 2017-09-17 DIAGNOSIS — N201 Calculus of ureter: Secondary | ICD-10-CM

## 2017-09-17 DIAGNOSIS — Z6833 Body mass index (BMI) 33.0-33.9, adult: Secondary | ICD-10-CM | POA: Diagnosis not present

## 2017-09-17 DIAGNOSIS — N202 Calculus of kidney with calculus of ureter: Secondary | ICD-10-CM | POA: Diagnosis present

## 2017-09-17 LAB — COMPREHENSIVE METABOLIC PANEL
ALBUMIN: 4.6 g/dL (ref 3.5–5.0)
ALK PHOS: 64 U/L (ref 38–126)
ALT: 21 U/L (ref 0–44)
ALT: 22 U/L (ref 0–44)
ANION GAP: 11 (ref 5–15)
AST: 24 U/L (ref 15–41)
AST: 23 U/L (ref 15–41)
Albumin: 4.5 g/dL (ref 3.5–5.0)
Alkaline Phosphatase: 60 U/L (ref 38–126)
Anion gap: 10 (ref 5–15)
BUN: 21 mg/dL — ABNORMAL HIGH (ref 6–20)
BUN: 22 mg/dL — AB (ref 6–20)
CALCIUM: 9.3 mg/dL (ref 8.9–10.3)
CALCIUM: 9.5 mg/dL (ref 8.9–10.3)
CO2: 25 mmol/L (ref 22–32)
CO2: 25 mmol/L (ref 22–32)
CREATININE: 1.57 mg/dL — AB (ref 0.61–1.24)
CREATININE: 1.62 mg/dL — AB (ref 0.61–1.24)
Chloride: 105 mmol/L (ref 98–111)
Chloride: 107 mmol/L (ref 98–111)
GFR calc Af Amer: >60 mL/min (ref >60)
GFR calc non Af Amer: 55 mL/min — ABNORMAL LOW (ref >60)
GFR calc non Af Amer: 53 mL/min — ABNORMAL LOW (ref >60)
GLUCOSE: 126 mg/dL — AB (ref 70–99)
Glucose, Bld: 132 mg/dL — ABNORMAL HIGH (ref 70–99)
Potassium: 4.2 mmol/L (ref 3.5–5.1)
Potassium: 4.3 mmol/L (ref 3.5–5.1)
SODIUM: 141 mmol/L (ref 135–145)
SODIUM: 142 mmol/L (ref 135–145)
Total Bilirubin: 0.6 mg/dL (ref 0.3–1.2)
Total Bilirubin: 0.9 mg/dL (ref 0.3–1.2)
Total Protein: 7.5 g/dL (ref 6.5–8.1)
Total Protein: 7.7 g/dL (ref 6.5–8.1)

## 2017-09-17 LAB — CBC WITH DIFFERENTIAL/PLATELET
BASOS PCT: 0 %
Basophils Absolute: 0 10*3/uL (ref 0.0–0.1)
EOS PCT: 1 %
Eosinophils Absolute: 0.1 10*3/uL (ref 0.0–0.7)
HEMATOCRIT: 41.9 % (ref 39.0–52.0)
Hemoglobin: 14.6 g/dL (ref 13.0–17.0)
Lymphocytes Relative: 8 %
Lymphs Abs: 1 10*3/uL (ref 0.7–4.0)
MCH: 33 pg (ref 26.0–34.0)
MCHC: 34.8 g/dL (ref 30.0–36.0)
MCV: 94.8 fL (ref 78.0–100.0)
MONO ABS: 0.7 10*3/uL (ref 0.1–1.0)
MONOS PCT: 6 %
NEUTROS ABS: 10.8 10*3/uL — AB (ref 1.7–7.7)
Neutrophils Relative %: 85 %
Platelets: 267 10*3/uL (ref 150–400)
RBC: 4.42 MIL/uL (ref 4.22–5.81)
RDW: 11.7 % (ref 11.5–15.5)
WBC: 12.6 10*3/uL — ABNORMAL HIGH (ref 4.0–10.5)

## 2017-09-17 LAB — URINALYSIS, ROUTINE W REFLEX MICROSCOPIC
BACTERIA UA: NONE SEEN
Bilirubin Urine: NEGATIVE
Glucose, UA: NEGATIVE mg/dL
KETONES UR: NEGATIVE mg/dL
Leukocytes, UA: NEGATIVE
NITRITE: NEGATIVE
PH: 5 (ref 5.0–8.0)
Protein, ur: 30 mg/dL — AB
RBC / HPF: >50 RBC/hpf — ABNORMAL HIGH (ref 0–5)
Specific Gravity, Urine: 1.028 (ref 1.005–1.030)

## 2017-09-17 LAB — CBC
HCT: 42.8 % (ref 39.0–52.0)
HEMOGLOBIN: 14.5 g/dL (ref 13.0–17.0)
MCH: 32.2 pg (ref 26.0–34.0)
MCHC: 33.9 g/dL (ref 30.0–36.0)
MCV: 94.9 fL (ref 78.0–100.0)
Platelets: 278 10*3/uL (ref 150–400)
RBC: 4.51 MIL/uL (ref 4.22–5.81)
RDW: 11.7 % (ref 11.5–15.5)
WBC: 12.9 10*3/uL — ABNORMAL HIGH (ref 4.0–10.5)

## 2017-09-17 LAB — LIPASE, BLOOD: Lipase: 38 U/L (ref 11–51)

## 2017-09-17 MED ORDER — KETOROLAC TROMETHAMINE 15 MG/ML IJ SOLN
15.0000 mg | Freq: Once | INTRAMUSCULAR | Status: AC
  Administered 2017-09-17: 15 mg via INTRAVENOUS
  Filled 2017-09-17: qty 1

## 2017-09-17 MED ORDER — KETOROLAC TROMETHAMINE 15 MG/ML IJ SOLN: 30.0000 mg | Freq: Once | INTRAMUSCULAR | Status: DC

## 2017-09-17 MED ORDER — OXYCODONE-ACETAMINOPHEN 5-325 MG PO TABS
2.0000 | ORAL_TABLET | Freq: Once | ORAL | Status: AC
  Administered 2017-09-17: 2 via ORAL
  Filled 2017-09-17: qty 2

## 2017-09-17 MED ORDER — HYDROMORPHONE HCL 1 MG/ML IJ SOLN
INTRAMUSCULAR | Status: AC
  Administered 2017-09-17: 1 mg
  Filled 2017-09-17: qty 1

## 2017-09-17 MED ORDER — ONDANSETRON HCL 4 MG PO TABS: 4.0000 mg | ORAL_TABLET | Freq: Three times a day (TID) | ORAL | 0 refills | Status: DC | PRN

## 2017-09-17 MED ORDER — OXYCODONE-ACETAMINOPHEN 5-325 MG PO TABS: 1.0000 | ORAL_TABLET | Freq: Four times a day (QID) | ORAL | 0 refills | Status: DC | PRN

## 2017-09-17 MED ORDER — ONDANSETRON HCL 4 MG/2ML IJ SOLN
INTRAMUSCULAR | Status: AC
  Administered 2017-09-17: 4 mg
  Filled 2017-09-17: qty 2

## 2017-09-17 MED ORDER — TAMSULOSIN HCL 0.4 MG PO CAPS: 0.4000 mg | ORAL_CAPSULE | Freq: Every day | ORAL | 0 refills | Status: DC

## 2017-09-17 NOTE — ED Provider Notes (Signed)
  Physical Exam  BP (!) 134/102   Pulse 91   Temp 98.2 F (36.8 C) (Oral)   Resp 18   Ht 5\' 10"  (1.778 m)   Wt 99.8 kg   SpO2 97%   BMI 31.57 kg/m   Physical Exam   GEN: Appears in no distress.  Resting comfortably in the bed. Abd: soft with mild TTP of RLQ  ED Course/Procedures      MDM   Patient signed out to me by A Law, PA-C.  Please see previous notes for further history.  In brief, patient presenting for evaluation of acute onset right sided/right lower quadrant abdominal pain.  Patient states this feels similar to previous kidney stones, has a history of frequent kidney stones.  Labs show mild elevation in creatinine, otherwise reassuring.  Urine without infection.  Renal ultrasound shows right hydronephrosis without obvious stone.  A Law discussed with Dr. Retta Dionesahlstedt from urology, who recommended KUB and further pain control.  If pain is improved and KUB shows distal stone, patient can be discharged.  On reassessment, patient reports pain is improved.pain is in lower abd/near penis, pt thinks it is passing/recently passed. Pt appears nontoxic. Dicussed findings with patient.  Patient states he feels like this is his kidney stone passing, will not obtain another CT today.  Will treat for presumed passing/recently passed kidney stone with Percocet, NSAIDs, Zofran, and Flomax.  Discussed importance of hydration.  Patient strict return precautions, including worsening/uncontrolled pain, fever, or inability to urinate.  At this time, patient appears safe for discharge.  Return precautions given.  Patient states he understands agrees plan.     Alveria ApleyCaccavale, Devin Markowicz, PA-C 09/17/17 [id number]

## 2017-09-17 NOTE — ED Provider Notes (Signed)
[facility]-EMERGENCY DEPT Provider Note   CSN: [id number] Arrival date & time: 09/17/17  0046     History   Chief Complaint No chief complaint on file.   HPI Devin Sanders is a 37 y.o. male with history of kidney stones who presents with right lower quadrant pain since yesterday.  Patient reports pain feels exactly like his normal kidney stone pain.  He reports the numbness in his general area, which she normally gets with his kidney stones.  He denies any back pain, fevers.  He has had associated nausea and vomiting.  He took ibuprofen at home, but could not keep it down.  HPI  Past Medical History:  Diagnosis Date  . GERD (gastroesophageal reflux disease)   . History of kidney stones   . Renal calculi    bilateral   . Wears glasses     There are no active problems to display for this patient.   Past Surgical History:  Procedure Laterality Date  . CYSTOSCOPY WITH RETROGRADE PYELOGRAM, URETEROSCOPY AND STENT PLACEMENT Bilateral 06/26/2017   Procedure: CYSTOSCOPY WITH RETROGRADE PYELOGRAM, URETEROSCOPY, STONE BASKETRY  AND STENT PLACEMENT;  Surgeon: [name], MD;  Location: [facility];  Service: Urology;  Laterality: Bilateral;  . HOLMIUM LASER APPLICATION Bilateral 06/26/2017   Procedure: HOLMIUM LASER APPLICATION;  Surgeon: [name], MD;  Location: [facility];  Service: Urology;  Laterality: Bilateral;  . PERCUTANEOUS NEPHROSTOLITHOTOMY  2003;  2009;  06-22-2014  @ [facility]  . URETEROSCOPIC STONE MANIPULATION UNILATERAL  multiple since 1997--  last one 02-19-2017  @ [facility] by dr [email or web address]        Home Medications    Prior to Admission medications   Medication Sig Start Date End Date Taking? Authorizing Provider  Potassium Citrate 15 MEQ (1620 MG) TBCR Take 2 tablets by mouth 2 (two) times daily. 07/21/17  Yes [provider]  tiopronin (THIOLA) 100 MG tablet Take 200 mg by mouth 3 (three)  times daily.  07/23/16  Yes [provider]  cephALEXin (KEFLEX) 500 MG capsule Take 1 capsule (500 mg total) by mouth 2 (two) times daily. X 3 days to prevent infection with tethered stent Patient not taking: Reported on 09/17/2017 06/26/17   [name], MD  ketorolac (TORADOL) 10 MG tablet Take 1 tablet (10 mg total) by mouth every 8 (eight) hours as needed for moderate pain. Or stent discomfort post-operatively. Pt has tolerated prior. Patient not taking: Reported on 09/17/2017 06/26/17   [name], MD  ondansetron (ZOFRAN) 4 MG tablet Take 1 tablet (4 mg total) by mouth every 8 (eight) hours as needed for nausea or vomiting. Patient not taking: Reported on 09/17/2017 06/29/17   Elisha Ponder, PA-C  oxyCODONE-acetaminophen (PERCOCET/ROXICET) 5-325 MG tablet Take 1 tablet by mouth every 6 (six) hours as needed for severe pain. Patient not taking: Reported on 09/17/2017 06/29/17   Elisha Ponder, PA-C    Family History No family history on file.  Social History Social History   Tobacco Use  . Smoking status: Never Smoker  . Smokeless tobacco: Never Used  Substance Use Topics  . Alcohol use: Never    Frequency: Never  . Drug use: Never     Allergies   Morphine and Other   Review of Systems Review of Systems  Constitutional: Negative for chills and fever.  HENT: Negative for facial swelling and sore throat.   Respiratory: Negative for shortness of breath.   Cardiovascular: Negative for  chest pain.  Gastrointestinal: Positive for abdominal pain, nausea and vomiting.  Genitourinary: Positive for dysuria.  Musculoskeletal: Negative for back pain.  Skin: Negative for rash and wound.  Neurological: Negative for headaches.  Psychiatric/Behavioral: The patient is not nervous/anxious.      Physical Exam Updated Vital Signs BP (!) 148/91   Pulse 90   Temp 98.2 F (36.8 C) (Oral)   Resp 18   Ht 5\' 10"  (1.778 m)   Wt 99.8 kg   SpO2 95%   BMI 31.57 kg/m    Physical Exam  Constitutional: He appears well-developed and well-nourished. No distress.  HENT:  Head: Normocephalic and atraumatic.  Mouth/Throat: Oropharynx is clear and moist. No oropharyngeal exudate.  Eyes: Pupils are equal, round, and reactive to light. Conjunctivae are normal. Right eye exhibits no discharge. Left eye exhibits no discharge. No scleral icterus.  Neck: Normal range of motion. Neck supple. No thyromegaly present.  Cardiovascular: Normal rate, regular rhythm, normal heart sounds and intact distal pulses. Exam reveals no gallop and no friction rub.  No murmur heard. Pulmonary/Chest: Effort normal and breath sounds normal. No stridor. No respiratory distress. He has no wheezes. He has no rales.  Abdominal: Soft. Bowel sounds are normal. He exhibits no distension. There is tenderness in the right lower quadrant. There is no rebound, no guarding and no CVA tenderness.  Musculoskeletal: He exhibits no edema.  Lymphadenopathy:    He has no cervical adenopathy.  Neurological: He is alert. Coordination normal.  Skin: Skin is warm and dry. No rash noted. He is not diaphoretic. No pallor.  Psychiatric: He has a normal mood and affect.  Nursing note and vitals reviewed.    ED Treatments / Results  Labs (all labs ordered are listed, but only abnormal results are displayed) Labs Reviewed  CBC WITH DIFFERENTIAL/PLATELET - Abnormal; Notable for the following components:      Result Value   WBC 12.6 (*)    Neutro Abs 10.8 (*)    All other components within normal limits  COMPREHENSIVE METABOLIC PANEL - Abnormal; Notable for the following components:   Glucose, Bld 126 (*)    BUN 21 (*)    Creatinine, Ser 1.57 (*)    GFR calc non Af Amer 55 (*)    All other components within normal limits  URINALYSIS, ROUTINE W REFLEX MICROSCOPIC - Abnormal; Notable for the following components:   Hgb urine dipstick LARGE (*)    Protein, ur 30 (*)    RBC / HPF >50 (*)    All other  components within normal limits  CBC - Abnormal; Notable for the following components:   WBC 12.9 (*)    All other components within normal limits  COMPREHENSIVE METABOLIC PANEL - Abnormal; Notable for the following components:   Glucose, Bld 132 (*)    BUN 22 (*)    Creatinine, Ser 1.62 (*)    GFR calc non Af Amer 53 (*)    All other components within normal limits  LIPASE, BLOOD    EKG None  Radiology No results found.  Procedures Procedures (including critical care time)  Medications Ordered in ED Medications  HYDROmorphone (DILAUDID) 1 MG/ML injection (1 mg  Given 09/17/17 0601)  ondansetron (ZOFRAN) 4 MG/2ML injection (4 mg  Given 09/17/17 0601)  HYDROmorphone (DILAUDID) 1 MG/ML injection (1 mg  Given 09/17/17 0601)  oxyCODONE-acetaminophen (PERCOCET/ROXICET) 5-325 MG per tablet 2 tablet (2 tablets Oral Given 09/17/17 [id number])     Initial Impression / Assessment and  Plan / ED Course  I have reviewed the triage vital signs and the nursing notes.  Pertinent labs & imaging results that were available during my care of the patient were reviewed by me and considered in my medical decision making (see chart for details).     Patient with suspected kidney stone in the right ureter.  There is moderate hydronephrosis on the right on renal ultrasound.  Patient has large hematuria as well as a bump in his creatinine to 1.61.  I spoke with Dr. Retta Diones, urologist, who advised came.  If patient stone is distal and pain can be controlled in the ED, plan to discharge home with analgesia and follow-up outpatient.  Patient's pain initially not fully controlled with Dilaudid.  Will try oral medication, Percocet, at this time. At shift change, patient care transferred to [facility], PA-C for continued evaluation, follow up of KUB, pain control and determination of disposition.  Final Clinical Impressions(s) / ED Diagnoses   Final diagnoses:  Ureterolithiasis    ED Discharge Orders     None       Emi Holes, PA-C 09/17/17 0708    Molpus, Jonny Ruiz, MD 09/17/17 [phone]

## 2017-09-17 NOTE — ED Notes (Signed)
Two doses 0.5  1 dose of 1 mg  4 zofran  1500 ns in

## 2017-09-17 NOTE — ED Notes (Signed)
Pt in xray

## 2017-09-17 NOTE — Discharge Instructions (Signed)
Take ibuprofen 3 times a day with meals.  Do not take other anti-inflammatories at the same time (Advil, Motrin, naproxen, Aleve).  Use percocet as needed for severe or breakthrough pain. Do not drive or operate heavy machinery while taking this medicine.  Use zofran as needed for nausea or vomiting.  Take flomax daily until symptoms have resolved.  Return to the ER if you develop fevers, inability to urinate, severe pain not resolved with medication, or any new or concerning symptoms.

## 2017-09-17 NOTE — ED Notes (Signed)
Discharge instructions reviewed with patient. Patient verbalizes understanding. VSS. Patient's wife to drive patient home. Patient ambulatory at triage

## 2017-09-17 NOTE — ED Notes (Signed)
[place] called wanted the down time order placed in

## 2017-09-18 ENCOUNTER — Ambulatory Visit (HOSPITAL_COMMUNITY): Payer: 59

## 2017-09-18 ENCOUNTER — Encounter (HOSPITAL_COMMUNITY): Payer: Self-pay | Admitting: *Deleted

## 2017-09-18 ENCOUNTER — Other Ambulatory Visit: Payer: Self-pay

## 2017-09-18 ENCOUNTER — Other Ambulatory Visit: Payer: Self-pay | Admitting: Urology

## 2017-09-18 ENCOUNTER — Encounter (HOSPITAL_COMMUNITY)
Admission: AD | Disposition: A | Discharge status: Home / Self Care | Payer: Self-pay | Source: Ambulatory Visit | Attending: Urology

## 2017-09-18 ENCOUNTER — Inpatient Hospital Stay (HOSPITAL_COMMUNITY): Payer: 59 | Admitting: Certified Registered Nurse Anesthetist

## 2017-09-18 ENCOUNTER — Ambulatory Visit (HOSPITAL_COMMUNITY)
Admission: AD | Admit: 2017-09-18 | Discharge: 2017-09-18 | Disposition: A | Discharge status: Home / Self Care | Payer: 59 | Source: Ambulatory Visit | Attending: Urology | Admitting: Urology

## 2017-09-18 DIAGNOSIS — Z6833 Body mass index (BMI) 33.0-33.9, adult: Secondary | ICD-10-CM | POA: Insufficient documentation

## 2017-09-18 DIAGNOSIS — N202 Calculus of kidney with calculus of ureter: Secondary | ICD-10-CM | POA: Insufficient documentation

## 2017-09-18 HISTORY — PX: HOLMIUM LASER APPLICATION: SHX5852

## 2017-09-18 HISTORY — PX: CYSTOSCOPY WITH RETROGRADE PYELOGRAM, URETEROSCOPY AND STENT PLACEMENT: SHX5789

## 2017-09-18 SURGERY — CYSTOURETEROSCOPY, WITH RETROGRADE PYELOGRAM AND STENT INSERTION
Anesthesia: General | Site: Ureter | Laterality: Right

## 2017-09-18 MED ORDER — PROPOFOL 10 MG/ML IV BOLUS
INTRAVENOUS | Status: DC | PRN
  Administered 2017-09-18: 300 mg via INTRAVENOUS

## 2017-09-18 MED ORDER — MIDAZOLAM HCL 2 MG/2ML IJ SOLN
INTRAMUSCULAR | Status: AC
  Filled 2017-09-18: qty 2

## 2017-09-18 MED ORDER — SODIUM CHLORIDE 0.9 % IR SOLN
Status: DC | PRN
  Administered 2017-09-18: 3000 mL

## 2017-09-18 MED ORDER — HYDROMORPHONE HCL 1 MG/ML IJ SOLN
0.2500 mg | INTRAMUSCULAR | Status: DC | PRN
  Administered 2017-09-18 (×2): 0.5 mg via INTRAVENOUS

## 2017-09-18 MED ORDER — FENTANYL CITRATE (PF) 100 MCG/2ML IJ SOLN
50.0000 ug | Freq: Once | INTRAMUSCULAR | Status: AC
  Administered 2017-09-18: 50 ug via INTRAVENOUS
  Filled 2017-09-18: qty 2

## 2017-09-18 MED ORDER — FENTANYL CITRATE (PF) 100 MCG/2ML IJ SOLN
INTRAMUSCULAR | Status: DC | PRN
  Administered 2017-09-18: 50 ug via INTRAVENOUS
  Administered 2017-09-18 (×4): 25 ug via INTRAVENOUS

## 2017-09-18 MED ORDER — MIDAZOLAM HCL 5 MG/5ML IJ SOLN
INTRAMUSCULAR | Status: DC | PRN
  Administered 2017-09-18: 2 mg via INTRAVENOUS

## 2017-09-18 MED ORDER — ONDANSETRON HCL 4 MG/2ML IJ SOLN
INTRAMUSCULAR | Status: DC | PRN
  Administered 2017-09-18: 4 mg via INTRAVENOUS

## 2017-09-18 MED ORDER — PHENAZOPYRIDINE HCL 100 MG PO TABS: 100.0000 mg | ORAL_TABLET | Freq: Three times a day (TID) | ORAL | 0 refills | Status: AC | PRN

## 2017-09-18 MED ORDER — ONDANSETRON HCL 4 MG/2ML IJ SOLN
INTRAMUSCULAR | Status: AC
  Filled 2017-09-18: qty 2

## 2017-09-18 MED ORDER — LIDOCAINE HCL (CARDIAC) PF 50 MG/5ML IV SOSY
PREFILLED_SYRINGE | INTRAVENOUS | Status: DC | PRN
  Administered 2017-09-18: 100 mg via INTRAVENOUS

## 2017-09-18 MED ORDER — PROPOFOL 10 MG/ML IV BOLUS
INTRAVENOUS | Status: AC
  Filled 2017-09-18: qty 40

## 2017-09-18 MED ORDER — GENTAMICIN SULFATE 40 MG/ML IJ SOLN
5.0000 mg/kg | INTRAVENOUS | Status: AC
  Administered 2017-09-18: 500 mg via INTRAVENOUS
  Filled 2017-09-18: qty 12.5

## 2017-09-18 MED ORDER — SODIUM CHLORIDE 0.9 % IR SOLN
Status: DC | PRN
  Administered 2017-09-18: 1000 mL

## 2017-09-18 MED ORDER — DEXAMETHASONE SODIUM PHOSPHATE 10 MG/ML IJ SOLN
INTRAMUSCULAR | Status: AC
  Filled 2017-09-18: qty 1

## 2017-09-18 MED ORDER — DEXAMETHASONE SODIUM PHOSPHATE 10 MG/ML IJ SOLN
INTRAMUSCULAR | Status: DC | PRN
  Administered 2017-09-18: 10 mg via INTRAVENOUS

## 2017-09-18 MED ORDER — BELLADONNA ALKALOIDS-OPIUM 16.2-30 MG RE SUPP
RECTAL | Status: AC
  Filled 2017-09-18: qty 1

## 2017-09-18 MED ORDER — HYDROMORPHONE HCL 1 MG/ML IJ SOLN
INTRAMUSCULAR | Status: AC
  Filled 2017-09-18: qty 1

## 2017-09-18 MED ORDER — MEPERIDINE HCL 50 MG/ML IJ SOLN: 6.2500 mg | INTRAMUSCULAR | Status: DC | PRN

## 2017-09-18 MED ORDER — LIDOCAINE HCL URETHRAL/MUCOSAL 2 % EX GEL
CUTANEOUS | Status: AC
  Filled 2017-09-18: qty 5

## 2017-09-18 MED ORDER — PROMETHAZINE HCL 25 MG/ML IJ SOLN: 6.2500 mg | INTRAMUSCULAR | Status: DC | PRN

## 2017-09-18 MED ORDER — SODIUM CHLORIDE 0.9 % IV SOLN
INTRAVENOUS | Status: DC | PRN
  Administered 2017-09-18: 50 mL

## 2017-09-18 MED ORDER — CIPROFLOXACIN HCL 500 MG PO TABS: 500.0000 mg | ORAL_TABLET | Freq: Once | ORAL | 0 refills | Status: AC

## 2017-09-18 MED ORDER — FENTANYL CITRATE (PF) 250 MCG/5ML IJ SOLN
INTRAMUSCULAR | Status: AC
  Filled 2017-09-18: qty 5

## 2017-09-18 MED ORDER — MIDAZOLAM HCL 2 MG/2ML IJ SOLN: 0.5000 mg | Freq: Once | INTRAMUSCULAR | Status: DC | PRN

## 2017-09-18 MED ORDER — TRAMADOL HCL 50 MG PO TABS: 50.0000 mg | ORAL_TABLET | Freq: Four times a day (QID) | ORAL | 0 refills | Status: DC | PRN

## 2017-09-18 MED ORDER — LACTATED RINGERS IV SOLN
INTRAVENOUS | Status: DC
  Administered 2017-09-18 (×2): via INTRAVENOUS

## 2017-09-18 SURGICAL SUPPLY — 24 items
APL SKNCLS STERI-STRIP NONHPOA (GAUZE/BANDAGES/DRESSINGS) ×1
BAG URO CATCHER STRL LF (MISCELLANEOUS) ×2 IMPLANT
BENZOIN TINCTURE PRP APPL 2/3 (GAUZE/BANDAGES/DRESSINGS) ×1 IMPLANT
CATH URET 5FR 28IN OPEN ENDED (CATHETERS) ×2 IMPLANT
CLOTH BEACON ORANGE TIMEOUT ST (SAFETY) ×2 IMPLANT
COVER SURGICAL LIGHT HANDLE (MISCELLANEOUS) ×2 IMPLANT
DRSG TEGADERM 2-3/8X2-3/4 SM (GAUZE/BANDAGES/DRESSINGS) ×1 IMPLANT
EXTRACTOR STONE 1.7FRX115CM (UROLOGICAL SUPPLIES) ×1 IMPLANT
FIBER LASER FLEXIVA 1000 (UROLOGICAL SUPPLIES) IMPLANT
FIBER LASER FLEXIVA 365 (UROLOGICAL SUPPLIES) ×1 IMPLANT
FIBER LASER FLEXIVA 550 (UROLOGICAL SUPPLIES) IMPLANT
FIBER LASER TRAC TIP (UROLOGICAL SUPPLIES) IMPLANT
GLOVE BIOGEL M STRL SZ7.5 (GLOVE) ×2 IMPLANT
GOWN STRL REUS W/TWL XL LVL3 (GOWN DISPOSABLE) ×2 IMPLANT
GUIDEWIRE ANG ZIPWIRE 038X150 (WIRE) IMPLANT
GUIDEWIRE STR DUAL SENSOR (WIRE) ×2 IMPLANT
MANIFOLD NEPTUNE II (INSTRUMENTS) ×2 IMPLANT
PACK CYSTO (CUSTOM PROCEDURE TRAY) ×2 IMPLANT
SHEATH URETERAL 12FRX28CM (UROLOGICAL SUPPLIES) IMPLANT
SHEATH URETERAL 12FRX35CM (MISCELLANEOUS) IMPLANT
STENT URET 6FRX26 CONTOUR (STENTS) ×1 IMPLANT
TUBING CONNECTING 10 (TUBING) ×2 IMPLANT
TUBING UROLOGY SET (TUBING) ×2 IMPLANT
WIRE COONS/BENSON .038X145CM (WIRE) IMPLANT

## 2017-09-18 NOTE — Anesthesia Procedure Notes (Signed)
Procedure Name: LMA Insertion Date/Time: 09/18/2017 6:17 PM Performed by: Vanessa Durhamochran, Kadyn Chovan Glenn, CRNA Pre-anesthesia Checklist: Patient identified, Emergency Drugs available, Suction available and Patient being monitored Patient Re-evaluated:Patient Re-evaluated prior to induction Oxygen Delivery Method: Circle system utilized Preoxygenation: Pre-oxygenation with 100% oxygen Induction Type: IV induction Ventilation: Mask ventilation without difficulty LMA: LMA with gastric port inserted LMA Size: 4.0 Tube type: Oral Number of attempts: 1 Placement Confirmation: positive ETCO2 Tube secured with: Tape Dental Injury: Teeth and Oropharynx as per pre-operative assessment

## 2017-09-18 NOTE — H&P (Signed)
1 - Recurrent Nephrolithiasis -  Pre 2019 - PCNL several each side, URS x innumerable each side at [facility].  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 CMP, CBC 02/2017 normal, Cr 0.9.   PMH sig for mild obesity. 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 His PCP is [name] MD.   July 2019: " Gilad " is seen in f/u above after elective outpatient ureteroscopy late last month. he pulled tethered stents in interval and is back to baseline.    09/18/17: Seen yesterday in the emergency department for right-sided pain and discomfort described as his typical kidney stone symptoms. An ultrasound procured did show some right-sided hydronephrosis with an inconclusive KUB. Creatinine was 1.57 which is elevated from baseline, he had mild leukocytosis. Urinalysis with quite a bit of hematuria but no pyuria/bacteriuria. Ed notes do indicate pain management was difficult to achieve initially. He was prescribed tamsulosin, Zofran, and Percocet at time of discharge.    Today he presents with continued RLQ pain described as constant. He has been taking Percocet so he states pain is tolerable. Also reports some increased paresthesia along the penis and right-sided scrotum which he states develops typically right before a stone passes. He started tamsulosin last night. Denies fevers, bothersome LUTS. He denies interval stone material passage. Currently tolerating oral intake without nausea.     ALLERGIES: Mango - Swelling Morphine - Vomiting, Nausea    MEDICATIONS: Tamsulosin Hcl 0.4 mg capsule  Oxycodone-Acetaminophen  Potassium Citrate Er 15 meq (1,620 mg) tablet, extended release 2 tablet PO BID  Thiola 100 mg tablet 5 tablet PO BID  Zofran     GU PSH: Cysto Uretero Lithotripsy Cystoscopy Insert  Stent ESWL PCNL Ureteroscopic laser litho, Bilateral - 06/26/2017    NON-GU PSH: None   GU PMH: Renal calculus (Chronic), Bilateral, Bilateral nonobstructing stones. No bilateral ureteral calculus noted. He will continue with medical therapy for stone prevention and f/u as discussed in March w/Dr. Berneice Heinrich and PRN. - 04/07/2017 Renal and ureteral calculus (Stable, Chronic), Bilateral - 04/02/2017      PMH Notes: 1 - Recurrent Nephrolithiasis -  Pre 2019 - PCNL several each side, URS x innumerable each side at [facility].  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   Tobacco Use Assessment Completed: Used Tobacco in last 30 days? Does not use smokeless tobacco. Has never drank.  Does not use drugs. Drinks 1 caffeinated drink per day. Has not had a blood transfusion. 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    REVIEW OF SYSTEMS:    GU Review Male:   Patient denies frequent urination, hard to postpone urination, burning/ pain with urination, get up at night to urinate, leakage of urine, stream starts and stops, trouble starting your stream, have to  strain to urinate , erection problems, and penile pain.  Gastrointestinal (Upper):   Patient denies nausea, vomiting, and indigestion/ heartburn.  Gastrointestinal (Lower):   Patient denies diarrhea and constipation.  Constitutional:   Patient denies fever, night sweats, weight loss, and fatigue.  Skin:   Patient denies skin rash/ lesion and itching.  Eyes:   Patient denies  blurred vision and double vision.  Ears/ Nose/ Throat:   Patient denies sore throat and sinus problems.  Hematologic/Lymphatic:   Patient denies swollen glands and easy bruising.  Cardiovascular:   Patient denies leg swelling and chest pains.  Respiratory:   Patient denies cough and shortness of breath.  Endocrine:   Patient denies excessive thirst.  Musculoskeletal:   Patient denies back pain and joint pain.  Neurological:   Patient denies headaches and dizziness.  Psychologic:   Patient denies depression and anxiety.   Notes: right side abd pain    VITAL SIGNS:      09/18/2017 01:22 PM  BP 125/82 mmHg  Pulse 93 /min  Temperature 98.3 F / 36.8 C   MULTI-SYSTEM PHYSICAL EXAMINATION:    Constitutional: Well-nourished. No physical deformities. Normally developed. Good grooming.  Neck: Neck symmetrical, not swollen. Normal tracheal position.  Respiratory: No labored breathing, no use of accessory muscles.   Cardiovascular: Normal temperature, normal extremity pulses, no swelling, no varicosities.  Skin: No paleness, no jaundice, no cyanosis. No lesion, no ulcer, no rash.  Neurologic / Psychiatric: Oriented to time, oriented to place, oriented to person. No depression, no anxiety, no agitation.  Gastrointestinal: Mildly obese abdomen. No mass, no tenderness, no rigidity. No CVAT, no flank or suprapubic tenderness.  Musculoskeletal: Normal gait and station of head and neck.     PAST DATA REVIEWED:  Source Of History:  Patient  Records Review:   Previous Hospital Records, Previous Patient Records  Urine Test Review:   Urinalysis  X-Ray Review: Renal Ultrasound: Reviewed Films. Reviewed Report.  C.T. Stone Protocol: Reviewed Films. Discussed With Patient.     09/18/17  Urinalysis  Urine Appearance Clear   Urine Color Yellow   Urine Glucose Neg mg/dL  Urine Bilirubin Neg mg/dL  Urine Ketones Neg mg/dL  Urine Specific Gravity 1.020   Urine Blood Trace ery/uL  Urine pH 5.5    Urine Protein Trace mg/dL  Urine Urobilinogen 0.2 mg/dL  Urine Nitrites Neg   Urine Leukocyte Esterase Neg leu/uL  Urine WBC/hpf NS (Not Seen)   Urine RBC/hpf 3 - 10/hpf   Urine Epithelial Cells NS (Not Seen)   Urine Bacteria NS (Not Seen)   Urine Mucous Not Present   Urine Yeast NS (Not Seen)   Urine Trichomonas Not Present   Urine Cystals NS (Not Seen)   Urine Casts NS (Not Seen)   Urine Sperm Not Present    PROCEDURES:         C.T. Urogram - [id number]             Urinalysis w/Scope Dipstick Dipstick Cont'd Micro  Color: Yellow Bilirubin: Neg mg/dL WBC/hpf: NS (Not Seen)  Appearance: Clear Ketones: Neg mg/dL RBC/hpf: 3 - 16/XWR  Specific Gravity: 1.020 Blood: Trace ery/uL Bacteria: NS (Not Seen)  pH: 5.5 Protein: Trace mg/dL Cystals: NS (Not Seen)  Glucose: Neg mg/dL Urobilinogen: 0.2 mg/dL Casts: NS (Not Seen)    Nitrites: Neg Trichomonas: Not Present    Leukocyte Esterase: Neg leu/uL Mucous: Not Present      Epithelial Cells: NS (Not Seen)      Yeast:  NS (Not Seen)      Sperm: Not Present    ASSESSMENT:      ICD-10 Details  1 GU:   Renal and ureteral calculus - N20.2   2   RLQ pain - R10.31    Plan:  Plan to proceed with ureteroscopy on the right ureteral stones.  He has two stones currently and we will attempt to get both.  IF we are unable to get the stones, I'll leave a stent and have him follow-up in a few weeks with Dr. Berneice HeinrichManny to remove the stones.  I discussed the risk with the patient and he's ready to proceed.

## 2017-09-18 NOTE — Transfer of Care (Signed)
Immediate Anesthesia Transfer of Care Note  Patient: Devin Sanders  Procedure(s) Performed: CYSTOSCOPY WITH RETROGRADE PYELOGRAM, URETEROSCOPY AND STENT PLACEMENT (Right Ureter) HOLMIUM LASER APPLICATION (Right Ureter)  Patient Location: PACU  Anesthesia Type:General  Level of Consciousness: drowsy and patient cooperative  Airway & Oxygen Therapy: Patient Spontanous Breathing and Patient connected to face mask  Post-op Assessment: Report given to RN and Post -op Vital signs reviewed and stable  Post vital signs: Reviewed and stable  Last Vitals:  Vitals Value Taken Time  BP 125/89 09/18/2017  7:11 PM  Temp    Pulse 86 09/18/2017  7:13 PM  Resp 17 09/18/2017  7:13 PM  SpO2 99 % 09/18/2017  7:13 PM  Vitals shown include unvalidated device data.  Last Pain:  Vitals:   09/18/17 1800  TempSrc:   PainSc: 9          Complications: No apparent anesthesia complications

## 2017-09-18 NOTE — Anesthesia Preprocedure Evaluation (Signed)
Anesthesia Evaluation  Patient identified by MRN, date of birth, ID band Patient awake    Reviewed: Allergy & Precautions, NPO status , Patient's Chart, lab work & pertinent test results  History of Anesthesia Complications Negative for: history of anesthetic complications  Airway Mallampati: II  TM Distance: >3 FB Neck ROM: Full    Dental  (+) Dental Advisory Given   Pulmonary neg pulmonary ROS,    breath sounds clear to auscultation       Cardiovascular negative cardio ROS   Rhythm:Regular Rate:Normal     Neuro/Psych negative neurological ROS     GI/Hepatic negative GI ROS, Neg liver ROS,   Endo/Other  Morbid obesity  Renal/GU Renal InsufficiencyRenal disease (creat 1.62)stones     Musculoskeletal   Abdominal (+) + obese,   Peds  Hematology negative hematology ROS (+)   Anesthesia Other Findings   Reproductive/Obstetrics                             Anesthesia Physical Anesthesia Plan  ASA: II  Anesthesia Plan: General   Post-op Pain Management:    Induction: Intravenous  PONV Risk Score and Plan: 2 and Ondansetron and Dexamethasone  Airway Management Planned: LMA  Additional Equipment:   Intra-op Plan:   Post-operative Plan:   Informed Consent: I have reviewed the patients History and Physical, chart, labs and discussed the procedure including the risks, benefits and alternatives for the proposed anesthesia with the patient or authorized representative who has indicated his/her understanding and acceptance.   Dental advisory given  Plan Discussed with: CRNA and Surgeon  Anesthesia Plan Comments: (Plan routine monitors, GA- LMA OK)        Anesthesia Quick Evaluation

## 2017-09-18 NOTE — Anesthesia Postprocedure Evaluation (Signed)
Anesthesia Post Note  Patient: Devin Sanders  Procedure(s) Performed: CYSTOSCOPY WITH RETROGRADE PYELOGRAM, URETEROSCOPY AND STENT PLACEMENT (Right Ureter) HOLMIUM LASER APPLICATION (Right Ureter)     Patient location during evaluation: PACU Anesthesia Type: General Level of consciousness: awake and alert, oriented and patient cooperative Pain management: pain level controlled Vital Signs Assessment: post-procedure vital signs reviewed and stable Respiratory status: spontaneous breathing, nonlabored ventilation and respiratory function stable Cardiovascular status: blood pressure returned to baseline and stable Postop Assessment: no apparent nausea or vomiting Anesthetic complications: no    Last Vitals:  Vitals:   09/18/17 1939 09/18/17 1945  BP:  135/88  Pulse: 99 91  Resp: 16 18  Temp:    SpO2: 100% 96%    Last Pain:  Vitals:   09/18/17 1945  TempSrc:   PainSc: 4                  Glyn Gerads,E. Kandee Escalante

## 2017-09-18 NOTE — Op Note (Addendum)
Preoperative Diagnosis: Right nephrolithiasis  Postoperative Diagnosis:  Same  Procedure(s) Performed:   - Cystourethroscopy - Right ureteroscopic stone extraction with laser lithotripsy - Right retrograde pyelogram - Right ureteral stent removal  - Right ureteral stent placement - Intraoperative fluoroscopy with interpretation <1hr.   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 Currently with multiple obstructing Right ureteral stones. Risks & benefits of the procedure discussed with the patient, who wishes to proceed.  Findings:   1. Successful semi-ureteroscopy of distal right ureteral stone. All fragments basket extracted 2. Proximal ureteral stone was translocated to right lower pole 3. Successful right ureteral stent placement with string  Radiologic Interpretation of Retrograde Pyelogram: Initial Right retrograde pyelogram demonstrated proximal and distal ureteral opacities consistent with known stone locations. Post treatment RPG did not show contrast extravation. It did show translocated right lower pole stone.   Description:  The patient was correctly identified in the preop holding area where written informed consent as well potential risk and complication reviewed. The patient agreed. They were brought back to the operative suite where a preinduction timeout was performed. Once correct information was verified, general anesthesia was induced. They were then gently placed into dorsal lithotomy position with SCDs in place for VTE prophylaxis. They were prepped and draped in the usual sterile fashion and given appropriate preoperative  antibiotics. A second timeout was then performed.   We inserted a 39F rigid cystoscope per urethra with copious lubrication and normal saline irrigation running. This demonstrated findings as described above.    We cannulated the right ureteral orifice with the combination of a sensor wire and 5Fr open ended catheter. First RPG was shot but contrast immediately backflowed out of orifice suggestive of obstruction. We transitioned to semi-rigid ureteroscope where UO cannulation revealed an obstructed stone.   Wethen obtained a 200 micron holmium laser fiber and performed laser lithotripsy until all of the stone burden was fragmented into several small fragments which were basket extracted using a wire basket without complication. Stone fragments were sent for chemical analysis. We then re-explored the location of stone treatment which demonstrated no remaining stone burden. We continued semi rigid ureteroscopy to the proximal ureter where we encountered the second stone. The stone was translocated to the right lower pole with a wire basket. Pyeloscopy was performed which did not show other stones in renal pelvis.    We then advanced a 6Fr x 26cm JJ ureteral stent with dangler with the assistance of a stent pusher under direct fluoroscopic & visual guidance without difficultly. Sensor wire removal demonstrated satisfactory stent curl proximally in the renal pelvis and distally in the bladder. The bladder was emptied and all instrumentation was removed. All stone fragments were collected. The stent string was cut to an appropriate length and taped to the penile shaft. B&O suppository administered. The patient was woken up from anesthesia and taken to the recovery unit for routine postoperative care.   Post Op Plan:   1. Remove stent at home on Tuesay. Take ciprofloxacin 1 dose day of removal

## 2017-09-19 ENCOUNTER — Encounter (HOSPITAL_COMMUNITY): Payer: Self-pay | Admitting: Urology

## 2017-09-25 LAB — STONE ANALYSIS
Cystine: 100 %
STONE WEIGHT KSTONE: 31 mg

## 2017-09-28 ENCOUNTER — Encounter (HOSPITAL_COMMUNITY)
Admission: EM | Disposition: A | Discharge status: Home / Self Care | Payer: Self-pay | Source: Home / Self Care | Attending: Emergency Medicine

## 2017-09-28 ENCOUNTER — Observation Stay (HOSPITAL_COMMUNITY): Payer: 59 | Admitting: Anesthesiology

## 2017-09-28 ENCOUNTER — Encounter (HOSPITAL_COMMUNITY): Payer: Self-pay | Admitting: Emergency Medicine

## 2017-09-28 ENCOUNTER — Emergency Department (HOSPITAL_COMMUNITY): Payer: 59

## 2017-09-28 ENCOUNTER — Observation Stay (HOSPITAL_COMMUNITY)
Admission: EM | Admit: 2017-09-28 | Discharge: 2017-09-28 | Disposition: A | Discharge status: Home / Self Care | Payer: 59 | Attending: Urology | Admitting: Urology

## 2017-09-28 ENCOUNTER — Observation Stay (HOSPITAL_COMMUNITY): Payer: 59

## 2017-09-28 ENCOUNTER — Other Ambulatory Visit: Payer: Self-pay

## 2017-09-28 DIAGNOSIS — Z885 Allergy status to narcotic agent status: Secondary | ICD-10-CM | POA: Diagnosis not present

## 2017-09-28 DIAGNOSIS — Z91018 Allergy to other foods: Secondary | ICD-10-CM | POA: Insufficient documentation

## 2017-09-28 DIAGNOSIS — Z79899 Other long term (current) drug therapy: Secondary | ICD-10-CM | POA: Insufficient documentation

## 2017-09-28 DIAGNOSIS — Z87442 Personal history of urinary calculi: Secondary | ICD-10-CM | POA: Insufficient documentation

## 2017-09-28 DIAGNOSIS — Z9889 Other specified postprocedural states: Secondary | ICD-10-CM | POA: Insufficient documentation

## 2017-09-28 DIAGNOSIS — N132 Hydronephrosis with renal and ureteral calculous obstruction: Secondary | ICD-10-CM | POA: Diagnosis not present

## 2017-09-28 DIAGNOSIS — R109 Unspecified abdominal pain: Secondary | ICD-10-CM | POA: Diagnosis present

## 2017-09-28 DIAGNOSIS — E7201 Cystinuria: Secondary | ICD-10-CM | POA: Diagnosis not present

## 2017-09-28 DIAGNOSIS — N1330 Unspecified hydronephrosis: Secondary | ICD-10-CM | POA: Diagnosis present

## 2017-09-28 HISTORY — PX: CYSTOSCOPY/URETEROSCOPY/HOLMIUM LASER/STENT PLACEMENT: SHX6546

## 2017-09-28 LAB — CBC WITH DIFFERENTIAL/PLATELET
Basophils Absolute: 0 10*3/uL (ref 0.0–0.1)
Basophils Relative: 0 %
EOS PCT: 2 %
Eosinophils Absolute: 0.2 10*3/uL (ref 0.0–0.7)
HCT: 41.3 % (ref 39.0–52.0)
Hemoglobin: 14.3 g/dL (ref 13.0–17.0)
LYMPHS ABS: 1.5 10*3/uL (ref 0.7–4.0)
LYMPHS PCT: 15 %
MCH: 32.6 pg (ref 26.0–34.0)
MCHC: 34.6 g/dL (ref 30.0–36.0)
MCV: 94.3 fL (ref 78.0–100.0)
MONO ABS: 1.1 10*3/uL — AB (ref 0.1–1.0)
Monocytes Relative: 11 %
Neutro Abs: 7.3 10*3/uL (ref 1.7–7.7)
Neutrophils Relative %: 72 %
PLATELETS: 339 10*3/uL (ref 150–400)
RBC: 4.38 MIL/uL (ref 4.22–5.81)
RDW: 11.4 % — AB (ref 11.5–15.5)
WBC: 10.1 10*3/uL (ref 4.0–10.5)

## 2017-09-28 LAB — URINALYSIS, ROUTINE W REFLEX MICROSCOPIC
Bacteria, UA: NONE SEEN
Bilirubin Urine: NEGATIVE
GLUCOSE, UA: NEGATIVE mg/dL
Ketones, ur: NEGATIVE mg/dL
Leukocytes, UA: NEGATIVE
Nitrite: NEGATIVE
PROTEIN: NEGATIVE mg/dL
Specific Gravity, Urine: 1.021 (ref 1.005–1.030)
pH: 6 (ref 5.0–8.0)

## 2017-09-28 LAB — BASIC METABOLIC PANEL
Anion gap: 10 (ref 5–15)
BUN: 23 mg/dL — ABNORMAL HIGH (ref 6–20)
CO2: 24 mmol/L (ref 22–32)
CREATININE: 1.71 mg/dL — AB (ref 0.61–1.24)
Calcium: 9.6 mg/dL (ref 8.9–10.3)
Chloride: 106 mmol/L (ref 98–111)
GFR calc Af Amer: 57 mL/min — ABNORMAL LOW (ref >60)
GFR, EST NON AFRICAN AMERICAN: 49 mL/min — AB (ref >60)
GLUCOSE: 107 mg/dL — AB (ref 70–99)
Potassium: 3.8 mmol/L (ref 3.5–5.1)
SODIUM: 140 mmol/L (ref 135–145)

## 2017-09-28 SURGERY — CYSTOSCOPY/URETEROSCOPY/HOLMIUM LASER/STENT PLACEMENT
Anesthesia: General | Laterality: Right

## 2017-09-28 MED ORDER — DIPHENHYDRAMINE HCL 50 MG/ML IJ SOLN
12.5000 mg | Freq: Once | INTRAMUSCULAR | Status: AC
  Administered 2017-09-28: 12.5 mg via INTRAVENOUS

## 2017-09-28 MED ORDER — PHENYLEPHRINE HCL 10 MG/ML IJ SOLN
INTRAMUSCULAR | Status: AC
  Filled 2017-09-28: qty 1

## 2017-09-28 MED ORDER — SUGAMMADEX SODIUM 200 MG/2ML IV SOLN
INTRAVENOUS | Status: AC
  Filled 2017-09-28: qty 2

## 2017-09-28 MED ORDER — CEFAZOLIN SODIUM-DEXTROSE 2-4 GM/100ML-% IV SOLN
2.0000 g | Freq: Three times a day (TID) | INTRAVENOUS | Status: DC
  Administered 2017-09-28: 2 g via INTRAVENOUS
  Filled 2017-09-28 (×2): qty 100

## 2017-09-28 MED ORDER — SUCCINYLCHOLINE CHLORIDE 200 MG/10ML IV SOSY
PREFILLED_SYRINGE | INTRAVENOUS | Status: DC | PRN
  Administered 2017-09-28: 120 mg via INTRAVENOUS

## 2017-09-28 MED ORDER — DEXAMETHASONE SODIUM PHOSPHATE 10 MG/ML IJ SOLN
INTRAMUSCULAR | Status: DC | PRN
  Administered 2017-09-28: 10 mg via INTRAVENOUS

## 2017-09-28 MED ORDER — HYDROMORPHONE HCL 1 MG/ML IJ SOLN
0.2500 mg | INTRAMUSCULAR | Status: DC | PRN
  Administered 2017-09-28 (×3): 0.5 mg via INTRAVENOUS

## 2017-09-28 MED ORDER — HYDROMORPHONE HCL 1 MG/ML IJ SOLN
INTRAMUSCULAR | Status: AC
  Filled 2017-09-28: qty 1

## 2017-09-28 MED ORDER — OXYCODONE-ACETAMINOPHEN 5-325 MG PO TABS
1.0000 | ORAL_TABLET | ORAL | Status: DC | PRN
  Administered 2017-09-28: 1 via ORAL
  Filled 2017-09-28: qty 1

## 2017-09-28 MED ORDER — HYDROMORPHONE HCL 1 MG/ML IJ SOLN
1.0000 mg | Freq: Once | INTRAMUSCULAR | Status: AC
  Administered 2017-09-28: 1 mg via INTRAVENOUS
  Filled 2017-09-28: qty 1

## 2017-09-28 MED ORDER — PROPOFOL 10 MG/ML IV BOLUS
INTRAVENOUS | Status: AC
  Filled 2017-09-28: qty 20

## 2017-09-28 MED ORDER — DEXAMETHASONE SODIUM PHOSPHATE 10 MG/ML IJ SOLN
INTRAMUSCULAR | Status: AC
  Filled 2017-09-28: qty 1

## 2017-09-28 MED ORDER — DIPHENHYDRAMINE HCL 50 MG/ML IJ SOLN
INTRAMUSCULAR | Status: AC
  Filled 2017-09-28: qty 1

## 2017-09-28 MED ORDER — SODIUM CHLORIDE 0.9 % IR SOLN
Status: DC | PRN
  Administered 2017-09-28: 6000 mL

## 2017-09-28 MED ORDER — SUCCINYLCHOLINE CHLORIDE 200 MG/10ML IV SOSY
PREFILLED_SYRINGE | INTRAVENOUS | Status: AC
  Filled 2017-09-28: qty 10

## 2017-09-28 MED ORDER — ONDANSETRON HCL 4 MG/2ML IJ SOLN
INTRAMUSCULAR | Status: AC
  Filled 2017-09-28: qty 2

## 2017-09-28 MED ORDER — MIDAZOLAM HCL 5 MG/5ML IJ SOLN
INTRAMUSCULAR | Status: DC | PRN
  Administered 2017-09-28: 2 mg via INTRAVENOUS

## 2017-09-28 MED ORDER — PROPOFOL 10 MG/ML IV BOLUS
INTRAVENOUS | Status: DC | PRN
  Administered 2017-09-28: 200 mg via INTRAVENOUS

## 2017-09-28 MED ORDER — ONDANSETRON 8 MG PO TBDP
8.0000 mg | ORAL_TABLET | Freq: Once | ORAL | Status: AC
  Administered 2017-09-28: 8 mg via ORAL
  Filled 2017-09-28: qty 1

## 2017-09-28 MED ORDER — OXYBUTYNIN CHLORIDE 5 MG PO TABS: 5.0000 mg | ORAL_TABLET | Freq: Three times a day (TID) | ORAL | 1 refills | Status: DC | PRN

## 2017-09-28 MED ORDER — SODIUM CHLORIDE 0.9 % IV SOLN
INTRAVENOUS | Status: DC
  Administered 2017-09-28: 10:00:00 via INTRAVENOUS

## 2017-09-28 MED ORDER — LACTATED RINGERS IV SOLN
INTRAVENOUS | Status: DC
  Administered 2017-09-28 (×2): via INTRAVENOUS

## 2017-09-28 MED ORDER — FENTANYL CITRATE (PF) 250 MCG/5ML IJ SOLN
INTRAMUSCULAR | Status: DC | PRN
  Administered 2017-09-28 (×4): 50 ug via INTRAVENOUS

## 2017-09-28 MED ORDER — FENTANYL CITRATE (PF) 100 MCG/2ML IJ SOLN
INTRAMUSCULAR | Status: AC
  Filled 2017-09-28: qty 2

## 2017-09-28 MED ORDER — OXYCODONE-ACETAMINOPHEN 5-325 MG PO TABS: 1.0000 | ORAL_TABLET | Freq: Four times a day (QID) | ORAL | 0 refills | Status: DC | PRN

## 2017-09-28 MED ORDER — ONDANSETRON HCL 4 MG/2ML IJ SOLN
INTRAMUSCULAR | Status: DC | PRN
  Administered 2017-09-28: 4 mg via INTRAVENOUS

## 2017-09-28 MED ORDER — HYDROMORPHONE HCL 1 MG/ML IJ SOLN
0.5000 mg | INTRAMUSCULAR | Status: DC | PRN
  Administered 2017-09-28 (×2): 1 mg via INTRAVENOUS
  Filled 2017-09-28 (×2): qty 1

## 2017-09-28 MED ORDER — ROCURONIUM BROMIDE 10 MG/ML (PF) SYRINGE
PREFILLED_SYRINGE | INTRAVENOUS | Status: DC | PRN
  Administered 2017-09-28: 40 mg via INTRAVENOUS

## 2017-09-28 MED ORDER — PROMETHAZINE HCL 25 MG/ML IJ SOLN: 6.2500 mg | INTRAMUSCULAR | Status: DC | PRN

## 2017-09-28 MED ORDER — KETOROLAC TROMETHAMINE 15 MG/ML IJ SOLN
15.0000 mg | Freq: Once | INTRAMUSCULAR | Status: AC
  Administered 2017-09-28: 15 mg via INTRAVENOUS
  Filled 2017-09-28: qty 1

## 2017-09-28 MED ORDER — ONDANSETRON HCL 4 MG PO TABS: 4.0000 mg | ORAL_TABLET | Freq: Three times a day (TID) | ORAL | 0 refills | Status: DC | PRN

## 2017-09-28 MED ORDER — LIDOCAINE 2% (20 MG/ML) 5 ML SYRINGE
INTRAMUSCULAR | Status: AC
  Filled 2017-09-28: qty 5

## 2017-09-28 MED ORDER — IOHEXOL 300 MG/ML  SOLN
INTRAMUSCULAR | Status: DC | PRN
  Administered 2017-09-28: 8 mL

## 2017-09-28 MED ORDER — SUGAMMADEX SODIUM 200 MG/2ML IV SOLN
INTRAVENOUS | Status: DC | PRN
  Administered 2017-09-28: 200 mg via INTRAVENOUS

## 2017-09-28 MED ORDER — MIDAZOLAM HCL 2 MG/2ML IJ SOLN
INTRAMUSCULAR | Status: AC
  Filled 2017-09-28: qty 2

## 2017-09-28 MED ORDER — CEPHALEXIN 500 MG PO CAPS: 500.0000 mg | ORAL_CAPSULE | Freq: Two times a day (BID) | ORAL | 0 refills | Status: DC

## 2017-09-28 MED ORDER — LIDOCAINE 2% (20 MG/ML) 5 ML SYRINGE
INTRAMUSCULAR | Status: DC | PRN
  Administered 2017-09-28: 80 mg via INTRAVENOUS

## 2017-09-28 MED ORDER — ONDANSETRON HCL 4 MG/2ML IJ SOLN: 4.0000 mg | INTRAMUSCULAR | Status: DC | PRN

## 2017-09-28 MED ORDER — ROCURONIUM BROMIDE 10 MG/ML (PF) SYRINGE
PREFILLED_SYRINGE | INTRAVENOUS | Status: AC
  Filled 2017-09-28: qty 10

## 2017-09-28 MED ORDER — CHLORHEXIDINE GLUCONATE CLOTH 2 % EX PADS
6.0000 | MEDICATED_PAD | Freq: Every day | CUTANEOUS | Status: DC
  Administered 2017-09-28: 6 via TOPICAL

## 2017-09-28 SURGICAL SUPPLY — 27 items
BAG URINE DRAINAGE (UROLOGICAL SUPPLIES) IMPLANT
BAG URO CATCHER STRL LF (MISCELLANEOUS) ×2 IMPLANT
BASKET ZERO TIP NITINOL 2.4FR (BASKET) ×1 IMPLANT
BSKT STON RTRVL ZERO TP 2.4FR (BASKET) ×1
CABLE HIGH FREQUENCY MONO STRZ (ELECTRODE) ×1 IMPLANT
CATH FOLEY 2WAY SLVR  5CC 20FR (CATHETERS)
CATH FOLEY 2WAY SLVR 5CC 20FR (CATHETERS) IMPLANT
CATH URET 5FR 28IN OPEN ENDED (CATHETERS) ×1 IMPLANT
CLOTH BEACON ORANGE TIMEOUT ST (SAFETY) ×2 IMPLANT
ELECT COAG BALL END 3FR (ELECTROSURGICAL) ×2
ELECT REM PT RETURN 9FT ADLT (ELECTROSURGICAL) ×2
ELECTRODE COAG BALL END 3FR (ELECTROSURGICAL) IMPLANT
ELECTRODE REM PT RTRN 9FT ADLT (ELECTROSURGICAL) IMPLANT
EXTRACTOR STONE NITINOL NGAGE (UROLOGICAL SUPPLIES) IMPLANT
FIBER LASER FLEXIVA 365 (UROLOGICAL SUPPLIES) IMPLANT
FIBER LASER TRAC TIP (UROLOGICAL SUPPLIES) ×1 IMPLANT
GLOVE BIOGEL M STRL SZ7.5 (GLOVE) ×2 IMPLANT
GOWN STRL REUS W/TWL XL LVL3 (GOWN DISPOSABLE) ×2 IMPLANT
GUIDEWIRE STR DUAL SENSOR (WIRE) ×1 IMPLANT
GUIDEWIRE ZIPWRE .038 STRAIGHT (WIRE) ×3 IMPLANT
MANIFOLD NEPTUNE II (INSTRUMENTS) ×2 IMPLANT
PACK CYSTO (CUSTOM PROCEDURE TRAY) ×2 IMPLANT
SHEATH URETERAL 12FRX35CM (MISCELLANEOUS) IMPLANT
STENT CONTOUR 6FRX26X.038 (STENTS) ×1 IMPLANT
SYRINGE IRR TOOMEY STRL 70CC (SYRINGE) ×1 IMPLANT
TUBING CONNECTING 10 (TUBING) ×2 IMPLANT
TUBING UROLOGY SET (TUBING) ×2 IMPLANT

## 2017-09-28 NOTE — ED Notes (Signed)
ED TO INPATIENT HANDOFF REPORT  Name/Age/Gender Devin Sanders 37 y.o. male  Code Status   Home/SNF/Other Home  Chief Complaint Flank Pain  Level of Care/Admitting Diagnosis ED Disposition    ED Disposition Condition [facility] Area: [place] [[id number]]  Level of Care: Med-Surg [16]  Diagnosis: Hydronephrosis of right kidney [814481]  Admitting Physician: Festus Aloe [phone]  Attending Physician: Alexis Frock [phone]  PT Class (Do Not Modify): Observation [104]  PT Acc Code (Do Not Modify): Observation [10022]       Medical History Past Medical History:  Diagnosis Date  . GERD (gastroesophageal reflux disease)   . History of kidney stones   . Renal calculi    bilateral   . Wears glasses     Allergies Allergies  Allergen Reactions  . Morphine Nausea And Vomiting  . Other Hives and Swelling    MANGO'S    IV Location/Drains/Wounds Patient Lines/Drains/Airways Status   Active Line/Drains/Airways    Name:   Placement date:   Placement time:   Site:   Days:   Peripheral IV 09/17/17 Right Hand   09/17/17    -    Hand   11   Peripheral IV 09/28/17 Left;Posterior Forearm   09/28/17    0100    Forearm   less than 1   Ureteral Drain/Stent Right ureter 5 Fr.   06/26/17    0843    Right ureter   94   Ureteral Drain/Stent Left ureter 5 Fr.   06/26/17    0843    Left ureter   94   Ureteral Drain/Stent Right ureter 6 Fr.   09/18/17    1851    Right ureter   10   Incision (Closed) 06/26/17 Penis   06/26/17    0752     94          Labs/Imaging Results for orders placed or performed during the hospital encounter of 09/28/17 (from the past 48 hour(s))  CBC with Differential     Status: Abnormal   Collection Time: 09/28/17 12:56 AM  Result Value Ref Range   WBC 10.1 4.0 - 10.5 K/uL   RBC 4.38 4.22 - 5.81 MIL/uL   Hemoglobin 14.3 13.0 - 17.0 g/dL   HCT 41.3 39.0 - 52.0 %   MCV 94.3 78.0 - 100.0 fL   MCH 32.6 26.0 - 34.0  pg   MCHC 34.6 30.0 - 36.0 g/dL   RDW 11.4 (L) 11.5 - 15.5 %   Platelets 339 150 - 400 K/uL   Neutrophils Relative % 72 %   Neutro Abs 7.3 1.7 - 7.7 K/uL   Lymphocytes Relative 15 %   Lymphs Abs 1.5 0.7 - 4.0 K/uL   Monocytes Relative 11 %   Monocytes Absolute 1.1 (H) 0.1 - 1.0 K/uL   Eosinophils Relative 2 %   Eosinophils Absolute 0.2 0.0 - 0.7 K/uL   Basophils Relative 0 %   Basophils Absolute 0.0 0.0 - 0.1 K/uL    Comment: Performed at [facility], [place] [address], [place], [place] [address]  Basic metabolic panel     Status: Abnormal   Collection Time: 09/28/17 12:56 AM  Result Value Ref Range   Sodium 140 135 - 145 mmol/L   Potassium 3.8 3.5 - 5.1 mmol/L   Chloride 106 98 - 111 mmol/L   CO2 24 22 - 32 mmol/L   Glucose, Bld 107 (H) 70 - 99 mg/dL  BUN 23 (H) 6 - 20 mg/dL   Creatinine, Ser 1.71 (H) 0.61 - 1.24 mg/dL   Calcium 9.6 8.9 - 10.3 mg/dL   GFR calc non Af Amer 49 (L) >60 mL/min   GFR calc Af Amer 57 (L) >60 mL/min    Comment: (NOTE) The eGFR has been calculated using the CKD EPI equation. This calculation has not been validated in all clinical situations. eGFR's persistently <60 mL/min signify possible Chronic Kidney Disease.    Anion gap 10 5 - 15    Comment: Performed at [facility], [place] [address] [place], [place] [address]  Urinalysis, Routine w reflex microscopic     Status: Abnormal   Collection Time: 09/28/17 12:56 AM  Result Value Ref Range   Color, Urine YELLOW YELLOW   APPearance CLEAR CLEAR   Specific Gravity, Urine 1.021 1.005 - 1.030   pH 6.0 5.0 - 8.0   Glucose, UA NEGATIVE NEGATIVE mg/dL   Hgb urine dipstick MODERATE (A) NEGATIVE   Bilirubin Urine NEGATIVE NEGATIVE   Ketones, ur NEGATIVE NEGATIVE mg/dL   Protein, ur NEGATIVE NEGATIVE mg/dL   Nitrite NEGATIVE NEGATIVE   Leukocytes, UA NEGATIVE NEGATIVE   RBC / HPF 21-50 0 - 5 RBC/hpf   WBC, UA 0-5 0 - 5 WBC/hpf   Bacteria, UA NONE SEEN NONE SEEN    Mucus PRESENT     Comment: Performed at [facility], [place] [address], [place],  [address]   Dg Abd 1 View  Result Date: 09/28/2017 CLINICAL DATA:  37 y/o  M; right-sided flank pain. EXAM: ABDOMEN - 1 VIEW COMPARISON:  09/18/2017 CT abdomen and pelvis. FINDINGS: Normal bowel gas pattern. Multiple left kidney stones as seen on prior CT. No appreciable right kidney stone or calcification along the course of the ureters. Kidneys are partially obscured by bowel gas. Left-sided pelvic phleboliths. Bones are unremarkable. IMPRESSION: No right kidney stone or stone along the course of the ureters identified. Multiple left kidney stones. Normal bowel gas pattern. Electronically Signed   By: Kristine Garbe M.D.   On: 09/28/2017 06:23    Pending Labs Unresulted Labs (From admission, onward)    Start     Ordered   Signed and Held  HIV antibody (Routine Testing)  Once,   R     Signed and Held          Vitals/Pain Today's Vitals   09/28/17 0555 09/28/17 0600 09/28/17 0645 09/28/17 0750  BP:    (!) 149/95  Pulse:  87 85 84  Resp:    16  Temp:      TempSrc:      SpO2:  95% 96% 96%  PainSc: 7        Isolation Precautions No active isolations  Medications Medications  ondansetron (ZOFRAN-ODT) disintegrating tablet 8 mg (8 mg Oral Given 09/28/17 0110)  ketorolac (TORADOL) 15 MG/ML injection 15 mg (15 mg Intravenous Given 09/28/17 0219)  HYDROmorphone (DILAUDID) injection 1 mg (1 mg Intravenous Given 09/28/17 0346)  HYDROmorphone (DILAUDID) injection 1 mg (1 mg Intravenous Given 09/28/17 0523)  HYDROmorphone (DILAUDID) injection 1 mg (1 mg Intravenous Given 09/28/17 0749)    Mobility walks

## 2017-09-28 NOTE — Progress Notes (Signed)
Pt given discharge instructions with understanding. Pt has no questions at this time. Wife called to come get pt.

## 2017-09-28 NOTE — ED Provider Notes (Signed)
[facility]-EMERGENCY DEPT Provider Note  CSN: [id number] Arrival date & time: 09/28/17 0014  Chief Complaint(s) Flank Pain  HPI Devin Sanders is a 37 y.o. male with significant renal stone history who recently had lithotripsy, ureteroscopy stone retrieval, and stenting presents to the emergency department with right flank pain ongoing for several hours and gradually worsening.  Similar to prior renal colic.  Has tried taking Motrin for pain but unsuccessful.  Denies any associated nausea or emesis.  No fevers or chills.  Endorses hematuria.  No dysuria.  No diarrhea.  No other physical complaints.  HPI  Past Medical History Past Medical History:  Diagnosis Date  . GERD (gastroesophageal reflux disease)   . History of kidney stones   . Renal calculi    bilateral   . Wears glasses    There are no active problems to display for this patient.  Home Medication(s) Prior to Admission medications   Medication Sig Start Date End Date Taking? Authorizing Provider  ibuprofen (ADVIL,MOTRIN) 200 MG tablet Take 600 mg by mouth every 6 (six) hours as needed for moderate pain.   Yes [provider]  Potassium Citrate 15 MEQ (1620 MG) TBCR Take 2 tablets by mouth 2 (two) times daily. 07/21/17  Yes [provider]  tamsulosin (FLOMAX) 0.4 MG CAPS capsule Take 1 capsule (0.4 mg total) by mouth daily. Patient taking differently: Take 0.4 mg by mouth daily as needed (urinary).  09/17/17  Yes Caccavale, Sophia, PA-C  tiopronin (THIOLA) 100 MG tablet Take 200 mg by mouth 3 (three) times daily.  07/23/16  Yes [provider]  cephALEXin (KEFLEX) 500 MG capsule Take 1 capsule (500 mg total) by mouth 2 (two) times daily. X 3 days to prevent infection with tethered stent Patient not taking: Reported on 09/17/2017 06/26/17   [name], MD  ketorolac (TORADOL) 10 MG tablet Take 1 tablet (10 mg total) by mouth every 8 (eight) hours as needed for moderate pain.  Or stent discomfort post-operatively. Pt has tolerated prior. Patient not taking: Reported on 09/17/2017 06/26/17   [name], MD  ondansetron (ZOFRAN) 4 MG tablet Take 1 tablet (4 mg total) by mouth every 8 (eight) hours as needed. Patient not taking: Reported on 09/28/2017 09/17/17   Caccavale, Sophia, PA-C  oxyCODONE-acetaminophen (PERCOCET/ROXICET) 5-325 MG tablet Take 1 tablet by mouth every 6 (six) hours as needed for severe pain. Patient not taking: Reported on 09/28/2017 09/17/17   Caccavale, Sophia, PA-C  traMADol (ULTRAM) 50 MG tablet Take 1 tablet (50 mg total) by mouth every 6 (six) hours as needed. Patient not taking: Reported on 09/28/2017 09/18/17 09/18/18  Lenetta Quaker, MD                                                                                                                                    Past Surgical History Past Surgical History:  Procedure Laterality Date  .  CYSTOSCOPY WITH RETROGRADE PYELOGRAM, URETEROSCOPY AND STENT PLACEMENT Bilateral 06/26/2017   Procedure: CYSTOSCOPY WITH RETROGRADE PYELOGRAM, URETEROSCOPY, STONE BASKETRY  AND STENT PLACEMENT;  Surgeon: Sebastian AcheManny, Theodore, MD;  [facility];  Service: Urology;  Laterality: Bilateral;  . CYSTOSCOPY WITH RETROGRADE PYELOGRAM, URETEROSCOPY AND STENT PLACEMENT Right 09/18/2017   Procedure: CYSTOSCOPY WITH RETROGRADE PYELOGRAM, URETEROSCOPY AND STENT PLACEMENT;  Surgeon: Crist FatHerrick, Benjamin W, MD;  Location: [facility];  Service: Urology;  Laterality: Right;  . HOLMIUM LASER APPLICATION Bilateral 06/26/2017   Procedure: HOLMIUM LASER APPLICATION;  Surgeon: Sebastian AcheManny, Theodore, MD;  [facility] [place];  Service: Urology;  Laterality: Bilateral;  . HOLMIUM LASER APPLICATION Right 09/18/2017   Procedure: HOLMIUM LASER APPLICATION;  Surgeon: Crist FatHerrick, Benjamin W, MD;  Location: [facility];  Service: Urology;  Laterality: Right;  . PERCUTANEOUS NEPHROSTOLITHOTOMY  2003;  2009;  06[facility]   . URETEROSCOPIC STONE MANIPULATION UNILATERAL  multiple since 1997--  last one 02-19-2017  [facility] by [email or web address]   Family History No family history on file.  Social History Social History   Tobacco Use  . Smoking status: Never Smoker  . Smokeless tobacco: Never Used  Substance Use Topics  . Alcohol use: Never    Frequency: Never  . Drug use: Never   Allergies Morphine and Other  Review of Systems Review of Systems All other systems are reviewed and are negative for acute change except as noted in the HPI  Physical Exam Vital Signs  I have reviewed the triage vital signs BP (!) 150/93 (BP Location: Left Arm)   Pulse 85   Temp 98.7 F (37.1 C) (Oral)   Resp 17   SpO2 96%   Physical Exam  Constitutional: He is oriented to person, place, and time. He appears well-developed and well-nourished. No distress.  HENT:  Head: Normocephalic and atraumatic.  Right Ear: External ear normal.  Left Ear: External ear normal.  Nose: Nose normal.  Mouth/Throat: Mucous membranes are normal. No trismus in the jaw.  Eyes: Conjunctivae and EOM are normal. No scleral icterus.  Neck: Normal range of motion and phonation normal.  Cardiovascular: Normal rate and regular rhythm.  Pulmonary/Chest: Effort normal. No stridor. No respiratory distress.  Abdominal: He exhibits no distension. There is no tenderness. There is CVA tenderness (Mild right discomfort).  Musculoskeletal: Normal range of motion. He exhibits no edema.  Neurological: He is alert and oriented to person, place, and time.  Skin: He is not diaphoretic.  Psychiatric: He has a normal mood and affect. His behavior is normal.  Vitals reviewed.   ED Results and Treatments Labs (all labs ordered are listed, but only abnormal results are displayed) Labs Reviewed  CBC WITH DIFFERENTIAL/PLATELET - Abnormal; Notable for the following components:      Result Value   RDW 11.4 (*)    Monocytes Absolute 1.1 (*)    All  other components within normal limits  BASIC METABOLIC PANEL - Abnormal; Notable for the following components:   Glucose, Bld 107 (*)    BUN 23 (*)    Creatinine, Ser 1.71 (*)    GFR calc non Af Amer 49 (*)    GFR calc Af Amer 57 (*)    All other components within normal limits  URINALYSIS, ROUTINE W REFLEX MICROSCOPIC - Abnormal; Notable for the following components:   Hgb urine dipstick MODERATE (*)    All other components within normal limits  EKG  EKG Interpretation  Date/Time:    Ventricular Rate:    PR Interval:    QRS Duration:   QT Interval:    QTC Calculation:   R Axis:     Text Interpretation:        Radiology Dg Abd 1 View  Result Date: 09/28/2017 CLINICAL DATA:  37 y/o  M; right-sided flank pain. EXAM: ABDOMEN - 1 VIEW COMPARISON:  09/18/2017 CT abdomen and pelvis. FINDINGS: Normal bowel gas pattern. Multiple left kidney stones as seen on prior CT. No appreciable right kidney stone or calcification along the course of the ureters. Kidneys are partially obscured by bowel gas. Left-sided pelvic phleboliths. Bones are unremarkable. IMPRESSION: No right kidney stone or stone along the course of the ureters identified. Multiple left kidney stones. Normal bowel gas pattern. Electronically Signed   By: Mitzi Hansen M.D.   On: 09/28/2017 06:23   Pertinent labs & imaging results that were available during my care of the patient were reviewed by me and considered in my medical decision making (see chart for details).  Medications Ordered in ED Medications  HYDROmorphone (DILAUDID) injection 1 mg (has no administration in time range)  ondansetron (ZOFRAN-ODT) disintegrating tablet 8 mg (8 mg Oral Given 09/28/17 0110)  ketorolac (TORADOL) 15 MG/ML injection 15 mg (15 mg Intravenous Given 09/28/17 0219)  HYDROmorphone (DILAUDID) injection 1 mg (1 mg  Intravenous Given 09/28/17 0346)  HYDROmorphone (DILAUDID) injection 1 mg (1 mg Intravenous Given 09/28/17 0523)                                                                                                                                    Procedures Procedures EMERGENCY DEPARTMENT US RENAL EXAM  "Study: Limited Retroperitoneal Ultrasound of Kidneys"  INDICATIONS: Flank pain Long and short axis of both kidneys were obtained.   PERFORMED BY: Myself IMAGES ARCHIVED?: Yes LIMITATIONS: Body habitus VIEWS USED: Long axis and Short axis  INTERPRETATION: Right Hydronephrosis mild to moderate    (including critical care time)  Medical Decision Making / ED Course I have reviewed the nursing notes for this encounter and the patient's prior records (if available in EHR or on provided paperwork).    Work-up most suspicious for renal colic.  UA notable for hematuria without evidence of urinary tract infection.  Mildly worsening renal insufficiency on chemistry panel.  No significant electrolyte derangements.  No leukocytosis.  Patient treated with several doses of IV pain medication and unable to achieve pain control.  Case discussed with Dr. Mena Goes from urology who requested a KUB.  He evaluated the patient in the emergency department and will plan to take the patient to ureteroscopy.  Final Clinical Impression(s) / ED Diagnoses Final diagnoses:  Flank pain      This chart was dictated using voice recognition software.  Despite best efforts to proofread,  errors can occur which can change the documentation meaning.   Evianna Chandran,  Amadeo Garnet, MD 09/28/17 [phone]

## 2017-09-28 NOTE — ED Triage Notes (Signed)
Pt arriving POV with right sided flank pain. Has hx of kidney stones. Had surgery for same recently. Stent was removed on Tuesday.

## 2017-09-28 NOTE — Anesthesia Preprocedure Evaluation (Addendum)
Anesthesia Evaluation  Patient identified by MRN, date of birth, ID band Patient awake    Reviewed: Allergy & Precautions, NPO status , Patient's Chart, lab work & pertinent test results  History of Anesthesia Complications Negative for: history of anesthetic complications  Airway Mallampati: II  TM Distance: >3 FB Neck ROM: Full    Dental  (+) Dental Advisory Given   Pulmonary neg pulmonary ROS,    breath sounds clear to auscultation       Cardiovascular negative cardio ROS   Rhythm:Regular Rate:Normal     Neuro/Psych negative neurological ROS  negative psych ROS   GI/Hepatic negative GI ROS, Neg liver ROS,   Endo/Other  Morbid obesity  Renal/GU Renal InsufficiencyRenal disease (creat 1.62)     Musculoskeletal   Abdominal (+) + obese,   Peds  Hematology negative hematology ROS (+)   Anesthesia Other Findings   Reproductive/Obstetrics                             Anesthesia Physical  Anesthesia Plan  ASA: II  Anesthesia Plan: General   Post-op Pain Management:    Induction: Intravenous, Rapid sequence and Cricoid pressure planned  PONV Risk Score and Plan: 3 and Ondansetron, Dexamethasone and Diphenhydramine  Airway Management Planned: Oral ETT  Additional Equipment:   Intra-op Plan:   Post-operative Plan: Extubation in OR  Informed Consent: I have reviewed the patients History and Physical, chart, labs and discussed the procedure including the risks, benefits and alternatives for the proposed anesthesia with the patient or authorized representative who has indicated his/her understanding and acceptance.   Dental advisory given  Plan Discussed with: CRNA and Surgeon  Anesthesia Plan Comments:        Anesthesia Quick Evaluation

## 2017-09-28 NOTE — Op Note (Signed)
Operative Note  Preoperative diagnosis:  1.  8 mm right midureteral stone 2.  History of cystine stones  Postoperative diagnosis: 1.  8 mm right midureteral stone 2.  History of cystine stones  Procedure(s): 1.  Cystoscopy with right ureteroscopy, holmium laser lithotripsy and right JJ stent placement 2.  Right retrograde pyelogram with intraoperative interpretation of fluoroscopic imaging  Surgeon: Rhoderick Moodyhristopher Winter, MD  Assistants:  None  Anesthesia:  General  Complications: Bleeding from a right upper pole papilla required fulguration  EBL: 100 mL  Specimens: 1.  None  Drains/Catheters: 1.  Right 6 French by 26 cm JJ stent without tether  Intraoperative findings:   1. Obstructing and impacted 8 mm right mid ureteral stone 2. Pulsatile bleeding was seen from a right upper pole papilla adjacent to the sensor wire that required fulguration and was hemostatic at the conclusion of the case  Indication:  Devin Sanders is a 37 y.o. male with a history of cystinuria and kidney stones [facility] on 09/28/2017 with right-sided flank pain.  A renal ultrasound that revealed a hydronephrotic right kidney.  He had a CT from June of this year that demonstrated bilateral renal stones.  He has been consented for the above procedures, voices understanding and wishes to proceed.  Description of procedure:  After informed consent was obtained, the patient was brought to the operating room and general LMA anesthesia was administered. The patient was then placed in the dorsolithotomy position and prepped and draped in usual sterile fashion. A timeout was performed. A 23 French rigid cystoscope was then inserted into the urethral meatus and advanced into the bladder under direct vision. A complete bladder survey revealed no intravesical pathology.  A 5 French open-ended catheter was then inserted into the right ureteral orifice and a right  retrograde pyelogram was obtained, with a filling defect in the midportion of the right ureter.  The contrast would not extend into the proximal aspects of the right ureter and there was concern for an obstructing stone at the level of the pelvic brim.  A Glidewire was then advanced through the lumen of the ureteral catheter up to the right renal pelvis, under fluoroscopic guidance.  A semirigid ureteroscope was advanced alongside the wire, but could not be advanced up to the level of the obstructing stone.  A flexible ureteroscope was advanced into the right ureter and was advanced until his obstructing stone was identified.  The stone quickly migrated into the right renal pelvis.  A 200 m holmium laser was then used to dust the stone.  Upon retracting the ureteroscope, there was pulsatile bleeding some seen coming from the right upper pole papilla.  A Bugbee was then used to fulgurate the area until hemostasis was achieved.  The flexible ureteroscope was then removed under direct vision, revealing no large residual stone burden.  A 6 French by 26 cm JJ stent was then placed over the wire and into good position within the right collecting system, confirming placement via fluoroscopy.  The patient's bladder was drained.  He tolerated the procedure well and was transferred to the postanesthesia in stable condition.  Plan: Follow-up with Dr. Berneice HeinrichManny in 1 week for office cystoscopy and stent removal

## 2017-09-28 NOTE — H&P (Signed)
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 He has undergone multiple ureteroscopy and PCNL since age 33 due to cystine stones.  Last procedure was with Dr. Marlou Porch September 18, 2017 on a 9 mm right distal and a right proximal 10 mm stone. The right proximal stone was relocated to the right lower pole and not treated. The patient had a stent with a string which he removed 6 days ago. In the ED he has Toradol and multiple rounds of dilaudid and continued to have uncontrolled pain.  Ultrasound in the emergency department revealed moderate right hydronephrosis and a stone was not seen in the kidney - I reviewed the images and sweep with Dr. Eudelia Bunch (a picture was uploaded to his chart).  KUB equivocal.  He's had no fever, chills or dysuria.     Past Medical History:  Diagnosis Date  . GERD (gastroesophageal reflux disease)   . History of kidney stones   . Renal calculi    bilateral   . Wears glasses    Past Surgical History:  Procedure Laterality Date  . CYSTOSCOPY WITH RETROGRADE PYELOGRAM, URETEROSCOPY AND STENT PLACEMENT Bilateral 06/26/2017   Procedure: CYSTOSCOPY WITH RETROGRADE PYELOGRAM, URETEROSCOPY, STONE BASKETRY  AND STENT PLACEMENT;  Surgeon: [name], MD;  Location: [facility];  Service: Urology;  Laterality: Bilateral;  . CYSTOSCOPY WITH RETROGRADE PYELOGRAM, URETEROSCOPY AND STENT PLACEMENT Right 09/18/2017   Procedure: CYSTOSCOPY WITH RETROGRADE PYELOGRAM, URETEROSCOPY AND STENT PLACEMENT;  Surgeon: Crist Fat, MD;  Location: [facility];  Service: Urology;  Laterality: Right;  . HOLMIUM LASER APPLICATION Bilateral 06/26/2017   Procedure: HOLMIUM LASER APPLICATION;  Surgeon: [name], MD;  Location: [facility];  Service: Urology;  Laterality:  Bilateral;  . HOLMIUM LASER APPLICATION Right 09/18/2017   Procedure: HOLMIUM LASER APPLICATION;  Surgeon: Crist Fat, MD;  Location: [facility];  Service: Urology;  Laterality: Right;  . PERCUTANEOUS NEPHROSTOLITHOTOMY  2003;  2009;  06-22-2014  @ [facility]  . URETEROSCOPIC STONE MANIPULATION UNILATERAL  multiple since 1997--  last one 02-19-2017  @ [facility] by dr [email or web address]-[email or web address]    Home Medications:  Current Facility-Administered Medications  Medication Dose Route Frequency Provider Last Rate Last Dose  . HYDROmorphone (DILAUDID) injection 1 mg  1 mg Intravenous Once Cardama, Amadeo Garnet, MD       Current Outpatient Medications  Medication Sig Dispense Refill Last Dose  . ibuprofen (ADVIL,MOTRIN) 200 MG tablet Take 600 mg by mouth every 6 (six) hours as needed for moderate pain.   09/27/2017 at Unknown time  . Potassium Citrate 15 MEQ (1620 MG) TBCR Take 2 tablets by mouth 2 (two) times daily.  1 09/27/2017 at Unknown time  . tamsulosin (FLOMAX) 0.4 MG CAPS capsule Take 1 capsule (0.4 mg total) by mouth daily. (Patient taking differently: Take 0.4 mg by mouth daily as needed (urinary). ) 30 capsule 0 09/27/2017 at Unknown time  . tiopronin (THIOLA) 100 MG tablet Take 200 mg by mouth 3 (three) times daily.    09/27/2017 at Unknown time  . cephALEXin (KEFLEX) 500 MG capsule Take 1 capsule (500 mg total) by mouth 2 (two) times daily. X 3 days to prevent infection with tethered stent (Patient not taking: Reported on 09/17/2017) 6 capsule 0 Completed Course at Unknown time  . ketorolac (TORADOL) 10 MG tablet Take 1  tablet (10 mg total) by mouth every 8 (eight) hours as needed for moderate pain. Or stent discomfort post-operatively. Pt has tolerated prior. (Patient not taking: Reported on 09/17/2017) 20 tablet 0 Completed Course at Unknown time  . ondansetron (ZOFRAN) 4 MG tablet Take 1 tablet (4 mg total) by mouth every 8 (eight) hours as needed. (Patient not taking: Reported on 09/28/2017) 12 tablet 0  Completed Course  . oxyCODONE-acetaminophen (PERCOCET/ROXICET) 5-325 MG tablet Take 1 tablet by mouth every 6 (six) hours as needed for severe pain. (Patient not taking: Reported on 09/28/2017) 8 tablet 0 Completed Course at Unknown time  . traMADol (ULTRAM) 50 MG tablet Take 1 tablet (50 mg total) by mouth every 6 (six) hours as needed. (Patient not taking: Reported on 09/28/2017) 15 tablet 0 Completed Course at Unknown time   Allergies:  Allergies  Allergen Reactions  . Morphine Nausea And Vomiting  . Other Hives and Swelling    MANGO'S    No family history on file. Social History:  reports that he has never smoked. He has never used smokeless tobacco. He reports that he does not drink alcohol or use drugs.  Review of Systems  All other systems reviewed and are negative.   Physical Exam:  Vital signs in last 24 hours: Temp:  [98.7 F (37.1 C)] 98.7 F (37.1 C) (09/23 0050) Pulse Rate:  [78-88] 85 (09/23 0645) Resp:  [17-18] 17 (09/23 0522) BP: (147-156)/(90-93) 150/93 (09/23 0522) SpO2:  [94 %-100 %] 96 % (09/23 0645) Physical Exam  NAD but looks uncomfortable - rocking around in bed CV - RRR Resp - reg effort and depth Abd - soft, NT Ext - no CCE   Laboratory Data:  Results for orders placed or performed during the hospital encounter of 09/28/17 (from the past 24 hour(s))  CBC with Differential     Status: Abnormal   Collection Time: 09/28/17 12:56 AM  Result Value Ref Range   WBC 10.1 4.0 - 10.5 K/uL   RBC 4.38 4.22 - 5.81 MIL/uL   Hemoglobin 14.3 13.0 - 17.0 g/dL   HCT 40.9 81.1 - 91.4 %   MCV 94.3 78.0 - 100.0 fL   MCH 32.6 26.0 - 34.0 pg   MCHC 34.6 30.0 - 36.0 g/dL   RDW 78.2 (L) 95.6 - 21.3 %   Platelets 339 150 - 400 K/uL   Neutrophils Relative % 72 %   Neutro Abs 7.3 1.7 - 7.7 K/uL   Lymphocytes Relative 15 %   Lymphs Abs 1.5 0.7 - 4.0 K/uL   Monocytes Relative 11 %   Monocytes Absolute 1.1 (H) 0.1 - 1.0 K/uL   Eosinophils Relative 2 %   Eosinophils  Absolute 0.2 0.0 - 0.7 K/uL   Basophils Relative 0 %   Basophils Absolute 0.0 0.0 - 0.1 K/uL  Basic metabolic panel     Status: Abnormal   Collection Time: 09/28/17 12:56 AM  Result Value Ref Range   Sodium 140 135 - 145 mmol/L   Potassium 3.8 3.5 - 5.1 mmol/L   Chloride 106 98 - 111 mmol/L   CO2 24 22 - 32 mmol/L   Glucose, Bld 107 (H) 70 - 99 mg/dL   BUN 23 (H) 6 - 20 mg/dL   Creatinine, Ser 0.86 (H) 0.61 - 1.24 mg/dL   Calcium 9.6 8.9 - 57.8 mg/dL   GFR calc non Af Amer 49 (L) >60 mL/min   GFR calc Af Amer 57 (L) >60 mL/min   Anion gap  10 5 - 15  Urinalysis, Routine w reflex microscopic     Status: Abnormal   Collection Time: 09/28/17 12:56 AM  Result Value Ref Range   Color, Urine YELLOW YELLOW   APPearance CLEAR CLEAR   Specific Gravity, Urine 1.021 1.005 - 1.030   pH 6.0 5.0 - 8.0   Glucose, UA NEGATIVE NEGATIVE mg/dL   Hgb urine dipstick MODERATE (A) NEGATIVE   Bilirubin Urine NEGATIVE NEGATIVE   Ketones, ur NEGATIVE NEGATIVE mg/dL   Protein, ur NEGATIVE NEGATIVE mg/dL   Nitrite NEGATIVE NEGATIVE   Leukocytes, UA NEGATIVE NEGATIVE   RBC / HPF 21-50 0 - 5 RBC/hpf   WBC, UA 0-5 0 - 5 WBC/hpf   Bacteria, UA NONE SEEN NONE SEEN   Mucus PRESENT    Recent Results (from the past 240 hour(s))  Stone analysis     Status: None   Collection Time: 09/18/17  6:57 PM  Result Value Ref Range Status   Color Tan  Final   Size Comment mm Final    Comment: Specimens received as a mixture of whole stones and fragments.   Stone Weight KSTONE 31.0 mg Corrected   Nidus Comment  Corrected    Comment: Nidus composed of Cystine.   Cystine 100 % Corrected   Composition Comment  Corrected    Comment: Percentage (Represents the % composition)   STONE COMMENT Note:  Corrected    Comment: (NOTE) Please do not submit specimens on Q-Tips, in tape, on filters, or in liquids such as blood, urine or formalin.  This may cause unnecessary biohazards, erroneous results and/or delay in  the processing of the specimen.    Photo Comment  Corrected    Comment: Photograph will follow under separate cover.   Comment: Comment  Corrected    Comment: (NOTE) Physician questions regarding Calculi Analysis contact [facility] at: [phone].    PLEASE NOTE: Comment  Corrected    Comment: (NOTE) Calculi report with photograph will follow via computer, mail or courier delivery.    Disclaimer - Kidney Stone Analysis: Comment  Corrected    Comment: (NOTE) This test was developed and its performance characteristics determined by [facility]. It has not been cleared or approved by the Food and Drug Administration. Performed At: [facility] [address] [place] [id number] Jolene SchimkeNagendra Sanjai MD YQ:[id number]:[phone]    Creatinine: Recent Labs    09/28/17 0056  CREATININE 1.71*   I reviewed the ultrasound images with Dr. Eudelia Bunchardama - stone was not seen in the kidney, mild to moderate right hydro. KUB reviewed and compared to last CT.  Impression/Assessment/plan:  Right flank pain, RLQ pain, right hydronephrosis - I discussed with the patient the nature, potential benefits, risks and alternatives to cystoscopy, right RGP, right ureteral stent possible right URS/HLL, including side effects of the proposed treatment, the likelihood of the patient achieving the goals of the procedure, and any potential problems that might occur during the procedure or recuperation. Discussed alternatives such as CT scan but pt has had many CT's. All questions answered. Patient elects to proceed. Discussed he may have passed the stone and may need a staged procedure.    Jerilee FieldMatthew Brannon Decaire 09/28/2017, 7:33 AM

## 2017-09-28 NOTE — Transfer of Care (Signed)
Immediate Anesthesia Transfer of Care Note  Patient: Devin BrothersMichael D Dobos  Procedure(s) Performed: CYSTOSCOPY/RIGHT RETROGRADE URETEROSCOPY/HOLMIUM LASER/ RIGHT STENT PLACEMENT (Right )  Patient Location: PACU  Anesthesia Type:General  Level of Consciousness: awake, drowsy and patient cooperative  Airway & Oxygen Therapy: Patient Spontanous Breathing and Patient connected to face mask oxygen  Post-op Assessment: Report given to RN, Post -op Vital signs reviewed and stable and Patient moving all extremities  Post vital signs: Reviewed and stable  Last Vitals:  Vitals Value Taken Time  BP 156/94 09/28/2017  1:55 PM  Temp 37.3 C 09/28/2017  1:55 PM  Pulse 90 09/28/2017  1:58 PM  Resp 10 09/28/2017  1:58 PM  SpO2 100 % 09/28/2017  1:58 PM  Vitals shown include unvalidated device data.  Last Pain:  Vitals:   09/28/17 1055  TempSrc: Oral  PainSc: 8       Patients Stated Pain Goal: 4 (09/28/17 1055)  Complications: No apparent anesthesia complications

## 2017-09-28 NOTE — Anesthesia Procedure Notes (Signed)
Procedure Name: Intubation Date/Time: 09/28/2017 12:22 PM Performed by: Mitzie Na, CRNA Pre-anesthesia Checklist: Patient identified, Emergency Drugs available, Suction available, Patient being monitored and Timeout performed Patient Re-evaluated:Patient Re-evaluated prior to induction Oxygen Delivery Method: Circle system utilized Preoxygenation: Pre-oxygenation with 100% oxygen Induction Type: Rapid sequence, IV induction and Cricoid Pressure applied Laryngoscope Size: Mac and 4 Grade View: Grade II Tube type: Oral Tube size: 7.5 mm Number of attempts: 1 Airway Equipment and Method: Stylet Placement Confirmation: ETT inserted through vocal cords under direct vision,  positive ETCO2 and breath sounds checked- equal and bilateral Secured at: 21 cm Tube secured with: Tape Dental Injury: Teeth and Oropharynx as per pre-operative assessment

## 2017-09-28 NOTE — Anesthesia Postprocedure Evaluation (Signed)
Anesthesia Post Note  Patient: Devin Sanders  Procedure(s) Performed: CYSTOSCOPY/RIGHT RETROGRADE URETEROSCOPY/HOLMIUM LASER/ RIGHT STENT PLACEMENT (Right )     Patient location during evaluation: PACU Anesthesia Type: General Level of consciousness: sedated Pain management: pain level controlled Vital Signs Assessment: post-procedure vital signs reviewed and stable Respiratory status: spontaneous breathing and respiratory function stable Cardiovascular status: stable Postop Assessment: no apparent nausea or vomiting Anesthetic complications: no    Last Vitals:  Vitals:   09/28/17 1500 09/28/17 1549  BP: (!) 146/99   Pulse: 84 98  Resp: 12 16  Temp: 37.1 C   SpO2: 99% 95%    Last Pain:  Vitals:   09/28/17 1500  TempSrc:   PainSc: Asleep                 Truly Stankiewicz DANIEL

## 2017-09-29 ENCOUNTER — Encounter (HOSPITAL_COMMUNITY): Payer: Self-pay | Admitting: Urology

## 2017-09-29 LAB — HIV ANTIBODY (ROUTINE TESTING W REFLEX): HIV Screen 4th Generation wRfx: NONREACTIVE

## 2017-10-05 ENCOUNTER — Ambulatory Visit (HOSPITAL_COMMUNITY): Payer: 59 | Admitting: Anesthesiology

## 2017-10-05 ENCOUNTER — Ambulatory Visit (HOSPITAL_COMMUNITY): Payer: 59

## 2017-10-05 ENCOUNTER — Other Ambulatory Visit: Payer: Self-pay | Admitting: Urology

## 2017-10-05 ENCOUNTER — Other Ambulatory Visit: Payer: Self-pay

## 2017-10-05 ENCOUNTER — Encounter (HOSPITAL_COMMUNITY): Payer: Self-pay | Admitting: *Deleted

## 2017-10-05 ENCOUNTER — Ambulatory Visit (HOSPITAL_COMMUNITY)
Admission: EM | Admit: 2017-10-05 | Discharge: 2017-10-05 | Disposition: A | Discharge status: Home / Self Care | Payer: 59 | Source: Ambulatory Visit | Attending: Urology | Admitting: Urology

## 2017-10-05 ENCOUNTER — Encounter (HOSPITAL_COMMUNITY)
Admission: EM | Disposition: A | Discharge status: Home / Self Care | Payer: Self-pay | Source: Ambulatory Visit | Attending: Urology

## 2017-10-05 DIAGNOSIS — E7201 Cystinuria: Secondary | ICD-10-CM | POA: Insufficient documentation

## 2017-10-05 DIAGNOSIS — N132 Hydronephrosis with renal and ureteral calculous obstruction: Secondary | ICD-10-CM | POA: Diagnosis present

## 2017-10-05 DIAGNOSIS — N210 Calculus in bladder: Secondary | ICD-10-CM | POA: Insufficient documentation

## 2017-10-05 DIAGNOSIS — N1330 Unspecified hydronephrosis: Secondary | ICD-10-CM | POA: Insufficient documentation

## 2017-10-05 DIAGNOSIS — Z91018 Allergy to other foods: Secondary | ICD-10-CM | POA: Diagnosis not present

## 2017-10-05 DIAGNOSIS — Z79899 Other long term (current) drug therapy: Secondary | ICD-10-CM | POA: Insufficient documentation

## 2017-10-05 DIAGNOSIS — Z79891 Long term (current) use of opiate analgesic: Secondary | ICD-10-CM | POA: Insufficient documentation

## 2017-10-05 DIAGNOSIS — Z885 Allergy status to narcotic agent status: Secondary | ICD-10-CM | POA: Diagnosis not present

## 2017-10-05 DIAGNOSIS — Z6831 Body mass index (BMI) 31.0-31.9, adult: Secondary | ICD-10-CM | POA: Insufficient documentation

## 2017-10-05 DIAGNOSIS — Z87442 Personal history of urinary calculi: Secondary | ICD-10-CM | POA: Diagnosis not present

## 2017-10-05 DIAGNOSIS — Z9889 Other specified postprocedural states: Secondary | ICD-10-CM | POA: Diagnosis not present

## 2017-10-05 HISTORY — PX: CYSTOSCOPY/RETROGRADE/URETEROSCOPY/STONE EXTRACTION WITH BASKET: SHX5317

## 2017-10-05 SURGERY — CYSTOSCOPY, WITH CALCULUS REMOVAL USING BASKET
Anesthesia: General | Site: Ureter | Laterality: Left

## 2017-10-05 MED ORDER — ONDANSETRON HCL 4 MG/2ML IJ SOLN
INTRAMUSCULAR | Status: AC
  Filled 2017-10-05: qty 2

## 2017-10-05 MED ORDER — LACTATED RINGERS IV SOLN
Freq: Once | INTRAVENOUS | Status: AC
  Administered 2017-10-05: 18:00:00 via INTRAVENOUS

## 2017-10-05 MED ORDER — CALCIUM CHLORIDE 10 % IV SOLN
INTRAVENOUS | Status: AC
  Filled 2017-10-05: qty 10

## 2017-10-05 MED ORDER — FENTANYL CITRATE (PF) 100 MCG/2ML IJ SOLN
INTRAMUSCULAR | Status: AC
  Filled 2017-10-05: qty 2

## 2017-10-05 MED ORDER — SUCCINYLCHOLINE CHLORIDE 200 MG/10ML IV SOSY
PREFILLED_SYRINGE | INTRAVENOUS | Status: DC | PRN
  Administered 2017-10-05: 120 mg via INTRAVENOUS

## 2017-10-05 MED ORDER — PROPOFOL 10 MG/ML IV BOLUS
INTRAVENOUS | Status: AC
  Filled 2017-10-05: qty 60

## 2017-10-05 MED ORDER — SODIUM CHLORIDE 0.9% FLUSH: 3.0000 mL | INTRAVENOUS | Status: DC | PRN

## 2017-10-05 MED ORDER — ONDANSETRON HCL 4 MG/2ML IJ SOLN
4.0000 mg | Freq: Once | INTRAMUSCULAR | Status: AC
  Administered 2017-10-05: 4 mg via INTRAVENOUS

## 2017-10-05 MED ORDER — PROPOFOL 10 MG/ML IV BOLUS
INTRAVENOUS | Status: DC | PRN
  Administered 2017-10-05: 200 mg via INTRAVENOUS

## 2017-10-05 MED ORDER — ACETAMINOPHEN 650 MG RE SUPP
650.0000 mg | RECTAL | Status: DC | PRN
  Filled 2017-10-05: qty 1

## 2017-10-05 MED ORDER — PROPOFOL 10 MG/ML IV BOLUS
INTRAVENOUS | Status: AC
  Filled 2017-10-05: qty 20

## 2017-10-05 MED ORDER — FENTANYL CITRATE (PF) 100 MCG/2ML IJ SOLN
INTRAMUSCULAR | Status: DC | PRN
  Administered 2017-10-05 (×2): 50 ug via INTRAVENOUS

## 2017-10-05 MED ORDER — CEFAZOLIN SODIUM-DEXTROSE 2-4 GM/100ML-% IV SOLN
2.0000 g | INTRAVENOUS | Status: AC
  Administered 2017-10-05: 2 g via INTRAVENOUS

## 2017-10-05 MED ORDER — SODIUM CHLORIDE 0.9 % IV SOLN: 250.0000 mL | INTRAVENOUS | Status: DC | PRN

## 2017-10-05 MED ORDER — FENTANYL CITRATE (PF) 100 MCG/2ML IJ SOLN
25.0000 ug | INTRAMUSCULAR | Status: DC | PRN
  Administered 2017-10-05 (×2): 50 ug via INTRAVENOUS

## 2017-10-05 MED ORDER — SUGAMMADEX SODIUM 500 MG/5ML IV SOLN
INTRAVENOUS | Status: DC | PRN
  Administered 2017-10-05: 225 mg via INTRAVENOUS

## 2017-10-05 MED ORDER — FENTANYL CITRATE (PF) 100 MCG/2ML IJ SOLN: 25.0000 ug | INTRAMUSCULAR | Status: DC | PRN

## 2017-10-05 MED ORDER — SODIUM CHLORIDE 0.9 % IR SOLN
Status: DC | PRN
  Administered 2017-10-05: 3000 mL

## 2017-10-05 MED ORDER — FENTANYL CITRATE (PF) 100 MCG/2ML IJ SOLN
INTRAMUSCULAR | Status: AC
  Administered 2017-10-05: 25 ug via INTRAVENOUS
  Filled 2017-10-05: qty 2

## 2017-10-05 MED ORDER — OXYCODONE HCL 5 MG PO TABS: 5.0000 mg | ORAL_TABLET | ORAL | Status: DC | PRN

## 2017-10-05 MED ORDER — FENTANYL CITRATE (PF) 100 MCG/2ML IJ SOLN
25.0000 ug | INTRAMUSCULAR | Status: DC | PRN
  Administered 2017-10-05 (×2): 25 ug via INTRAVENOUS

## 2017-10-05 MED ORDER — SODIUM CHLORIDE 0.9% FLUSH: 3.0000 mL | Freq: Two times a day (BID) | INTRAVENOUS | Status: DC

## 2017-10-05 MED ORDER — LIDOCAINE 2% (20 MG/ML) 5 ML SYRINGE
INTRAMUSCULAR | Status: DC | PRN
  Administered 2017-10-05: 60 mg via INTRAVENOUS

## 2017-10-05 MED ORDER — SUGAMMADEX SODIUM 500 MG/5ML IV SOLN
INTRAVENOUS | Status: AC
  Filled 2017-10-05: qty 5

## 2017-10-05 MED ORDER — CEFAZOLIN SODIUM-DEXTROSE 2-4 GM/100ML-% IV SOLN
INTRAVENOUS | Status: AC
  Filled 2017-10-05: qty 100

## 2017-10-05 MED ORDER — ONDANSETRON HCL 4 MG/2ML IJ SOLN
INTRAMUSCULAR | Status: DC | PRN
  Administered 2017-10-05: 4 mg via INTRAVENOUS

## 2017-10-05 MED ORDER — ONDANSETRON HCL 4 MG/2ML IJ SOLN: 4.0000 mg | Freq: Once | INTRAMUSCULAR | Status: DC | PRN

## 2017-10-05 MED ORDER — ACETAMINOPHEN 325 MG PO TABS: 650.0000 mg | ORAL_TABLET | ORAL | Status: DC | PRN

## 2017-10-05 MED ORDER — MIDAZOLAM HCL 2 MG/2ML IJ SOLN
INTRAMUSCULAR | Status: AC
  Filled 2017-10-05: qty 2

## 2017-10-05 MED ORDER — HYDROMORPHONE HCL 1 MG/ML IJ SOLN
0.2500 mg | INTRAMUSCULAR | Status: DC | PRN
  Administered 2017-10-05: 0.5 mg via INTRAVENOUS

## 2017-10-05 MED ORDER — HYDROMORPHONE HCL 1 MG/ML IJ SOLN
INTRAMUSCULAR | Status: AC
  Filled 2017-10-05: qty 1

## 2017-10-05 MED ORDER — ROCURONIUM BROMIDE 50 MG/5ML IV SOSY
PREFILLED_SYRINGE | INTRAVENOUS | Status: DC | PRN
  Administered 2017-10-05: 40 mg via INTRAVENOUS

## 2017-10-05 MED ORDER — DEXAMETHASONE SODIUM PHOSPHATE 10 MG/ML IJ SOLN
INTRAMUSCULAR | Status: DC | PRN
  Administered 2017-10-05: 10 mg via INTRAVENOUS

## 2017-10-05 MED ORDER — MIDAZOLAM HCL 5 MG/5ML IJ SOLN
INTRAMUSCULAR | Status: DC | PRN
  Administered 2017-10-05: 2 mg via INTRAVENOUS

## 2017-10-05 MED ORDER — LACTATED RINGERS IV SOLN
INTRAVENOUS | Status: DC | PRN
  Administered 2017-10-05 (×2): via INTRAVENOUS

## 2017-10-05 SURGICAL SUPPLY — 22 items
BAG URO CATCHER STRL LF (MISCELLANEOUS) ×3 IMPLANT
BASKET STONE NCOMPASS (UROLOGICAL SUPPLIES) IMPLANT
CATH URET 5FR 28IN OPEN ENDED (CATHETERS) IMPLANT
CATH URET DUAL LUMEN 6-10FR 50 (CATHETERS) ×3 IMPLANT
CLOTH BEACON ORANGE TIMEOUT ST (SAFETY) ×3 IMPLANT
COVER SURGICAL LIGHT HANDLE (MISCELLANEOUS) ×3 IMPLANT
EXTRACTOR STONE NITINOL NGAGE (UROLOGICAL SUPPLIES) ×3 IMPLANT
FIBER LASER FLEXIVA 1000 (UROLOGICAL SUPPLIES) IMPLANT
FIBER LASER FLEXIVA 365 (UROLOGICAL SUPPLIES) IMPLANT
FIBER LASER FLEXIVA 550 (UROLOGICAL SUPPLIES) IMPLANT
FIBER LASER TRAC TIP (UROLOGICAL SUPPLIES) IMPLANT
GLOVE SURG SS PI 8.0 STRL IVOR (GLOVE) IMPLANT
GOWN STRL REUS W/TWL XL LVL3 (GOWN DISPOSABLE) ×3 IMPLANT
GUIDEWIRE STR DUAL SENSOR (WIRE) ×3 IMPLANT
IV NS 1000ML (IV SOLUTION) ×3
IV NS 1000ML BAXH (IV SOLUTION) ×2 IMPLANT
IV NS IRRIG 3000ML ARTHROMATIC (IV SOLUTION) ×3 IMPLANT
MANIFOLD NEPTUNE II (INSTRUMENTS) ×3 IMPLANT
PACK CYSTO (CUSTOM PROCEDURE TRAY) ×3 IMPLANT
SHEATH URETERAL 12FRX35CM (MISCELLANEOUS) ×3 IMPLANT
TUBING CONNECTING 10 (TUBING) ×3 IMPLANT
TUBING UROLOGY SET (TUBING) ×3 IMPLANT

## 2017-10-05 NOTE — Anesthesia Procedure Notes (Signed)
Procedure Name: Intubation Date/Time: 10/05/2017 8:55 PM Performed by: West Pugh, CRNA Pre-anesthesia Checklist: Patient identified, Emergency Drugs available, Suction available, Patient being monitored and Timeout performed Patient Re-evaluated:Patient Re-evaluated prior to induction Oxygen Delivery Method: Circle system utilized Preoxygenation: Pre-oxygenation with 100% oxygen Induction Type: IV induction and Cricoid Pressure applied Ventilation: Mask ventilation without difficulty Laryngoscope Size: Mac and 4 Grade View: Grade II Tube type: Oral Tube size: 7.5 mm Number of attempts: 1 Airway Equipment and Method: Stylet Placement Confirmation: ETT inserted through vocal cords under direct vision,  positive ETCO2,  CO2 detector and breath sounds checked- equal and bilateral Secured at: 22 cm Tube secured with: Tape Dental Injury: Teeth and Oropharynx as per pre-operative assessment

## 2017-10-05 NOTE — Anesthesia Preprocedure Evaluation (Signed)
Anesthesia Evaluation  Patient identified by MRN, date of birth, ID band Patient awake    Reviewed: Allergy & Precautions, NPO status , Patient's Chart, lab work & pertinent test results  History of Anesthesia Complications Negative for: history of anesthetic complications  Airway Mallampati: II  TM Distance: >3 FB Neck ROM: Full    Dental  (+) Dental Advisory Given   Pulmonary neg pulmonary ROS,    breath sounds clear to auscultation       Cardiovascular negative cardio ROS   Rhythm:Regular Rate:Normal     Neuro/Psych negative neurological ROS  negative psych ROS   GI/Hepatic negative GI ROS, Neg liver ROS,   Endo/Other  Morbid obesity  Renal/GU Renal InsufficiencyRenal disease (creat 1.62)     Musculoskeletal   Abdominal (+) + obese,   Peds  Hematology negative hematology ROS (+)   Anesthesia Other Findings   Reproductive/Obstetrics                             Anesthesia Physical  Anesthesia Plan  ASA: II  Anesthesia Plan: General   Post-op Pain Management:    Induction: Intravenous  PONV Risk Score and Plan: 3 and Ondansetron, Dexamethasone and Treatment may vary due to age or medical condition  Airway Management Planned: LMA and Oral ETT  Additional Equipment:   Intra-op Plan:   Post-operative Plan: Extubation in OR  Informed Consent: I have reviewed the patients History and Physical, chart, labs and discussed the procedure including the risks, benefits and alternatives for the proposed anesthesia with the patient or authorized representative who has indicated his/her understanding and acceptance.   Dental advisory given  Plan Discussed with: CRNA and Surgeon  Anesthesia Plan Comments:         Anesthesia Quick Evaluation

## 2017-10-05 NOTE — H&P (Signed)
CC/HPI: At last office visit Devin Sanders was found to have 2 obstructing ureteral calculi on the right. He initially underwent stenting and treatment of the distal most calculi with pushing of the proximal stone into the renal pelvis. He then underwent follow-up ureteroscopy with Dr. Liliane Shi approximately 1 week ago. He presents today one day before scheduled ureteral stent removal here in clinic complaining of 24 hour history of acute left-sided renal colic with nausea and vomiting. He has passed several small stone fragments in the interval including 1 at time of providing a urine sample today here in clinic. Denies fevers but patient has had nausea and vomiting unable to keep any liquids or solid food on the stomach. Voiding at his baseline with only occasional urgency and burning urination. Urinalysis not overly suspicious for infection.     ALLERGIES: Mango - Swelling Morphine - Vomiting, Nausea    MEDICATIONS: Tamsulosin Hcl 0.4 mg capsule  Oxycodone-Acetaminophen  Potassium Citrate Er 15 meq (1,620 mg) tablet, extended release 2 tablet PO BID  Thiola 100 mg tablet 5 tablet PO BID  Zofran     GU PSH: Cysto Uretero Lithotripsy Cystoscopy Insert Stent ESWL PCNL Ureteroscopic laser litho, Right - 09/28/2017, Right - 09/18/2017, Bilateral - 06/26/2017    NON-GU PSH: None   GU PMH: RLQ pain - 09/18/2017 Renal calculus (Chronic), Bilateral, Bilateral nonobstructing stones. No bilateral ureteral calculus noted. He will continue with medical therapy for stone prevention and f/u as discussed in March w/Dr. Berneice Heinrich and PRN. - 04/07/2017 Renal and ureteral calculus (Stable, Chronic), Bilateral - 04/02/2017      PMH Notes: 1 - Recurrent Nephrolithiasis -  Pre 2019 - PCNL several each side, URS x innumerable each side at [facility].  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   Tobacco Use Assessment Completed: Used Tobacco in last 30 days? Does not use smokeless tobacco. Has never drank.  Does not use drugs. Drinks 1 caffeinated drink per day. Has not had a blood transfusion. 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    REVIEW OF SYSTEMS:    GU Review Male:   Patient denies frequent urination, hard to postpone urination, burning/ pain with urination, get up at night to urinate, leakage of urine, stream starts and stops, trouble starting your stream, have to strain to urinate , erection problems, and penile pain.  Gastrointestinal (Upper):   Patient reports nausea and vomiting. Patient denies indigestion/ heartburn.  Gastrointestinal (Lower):   Patient denies diarrhea and constipation.  Constitutional:   Patient reports fatigue. Patient denies fever, night sweats, and weight loss.  Skin:   Patient denies skin rash/ lesion and itching.  Eyes:   Patient denies blurred vision and double vision.  Ears/ Nose/ Throat:   Patient denies sore throat and sinus problems.  Hematologic/Lymphatic:   Patient denies swollen glands and easy bruising.  Cardiovascular:   Patient denies leg swelling and chest pains.  Respiratory:   Patient denies cough and shortness of breath.  Endocrine:   Patient denies excessive thirst.  Musculoskeletal:   Patient reports back pain. Patient denies joint pain.  Neurological:   Patient denies headaches and dizziness.  Psychologic:   Patient denies depression and anxiety.   VITAL SIGNS:      10/05/2017 04:38 PM  BP 135/97 mmHg  Pulse 87 /min  Temperature 98.0 F / 36.6 C   MULTI-SYSTEM PHYSICAL  EXAMINATION:    Constitutional: Well-nourished. No physical deformities. Normally developed. Good grooming.  Neck: Neck symmetrical, not swollen. Normal tracheal position.  Respiratory: No labored breathing, no use of accessory muscles.   Cardiovascular: Normal temperature, normal extremity pulses, no swelling, no varicosities.  Skin: No paleness, no jaundice, no cyanosis. No lesion, no ulcer, no rash.  Neurologic / Psychiatric: Oriented to time, oriented to place, oriented to person. No depression, no anxiety, no agitation.  Gastrointestinal: Mildly obese abdomen. No mass, no tenderness, no rigidity. Left CVAT.  Musculoskeletal: Normal gait and station of head and neck.     PAST DATA REVIEWED:  Source Of History:  Patient  Records Review:   Previous Doctor Records, Previous Hospital Records, Previous Patient Records  Urine Test Review:   Urinalysis  X-Ray Review: KUB: Reviewed Films. Discussed With Patient.     10/05/17  Urinalysis  Urine Appearance Cloudy   Urine Color Yellow   Urine Glucose Neg mg/dL  Urine Bilirubin Neg mg/dL  Urine Ketones Neg mg/dL  Urine Specific Gravity 1.025   Urine Blood 3+ ery/uL  Urine pH 6.0   Urine Protein 3+ mg/dL  Urine Urobilinogen 0.2 mg/dL  Urine Nitrites Neg   Urine Leukocyte Esterase 2+ leu/uL  Urine WBC/hpf 0 - 5/hpf   Urine RBC/hpf >60/hpf   Urine Epithelial Cells NS (Not Seen)   Urine Bacteria Few (10-25/hpf)   Urine Mucous Not Present   Urine Yeast NS (Not Seen)   Urine Trichomonas Not Present   Urine Cystals Cystine   Urine Casts NS (Not Seen)   Urine Sperm Not Present    PROCEDURES:         KUB - 28413  A single view of the abdomen is obtained.               Urinalysis w/Scope Dipstick Dipstick Cont'd Micro  Color: Yellow Bilirubin: Neg mg/dL WBC/hpf: 0 - 5/hpf  Appearance: Cloudy Ketones: Neg mg/dL RBC/hpf: >24/MWN  Specific Gravity: 1.025 Blood: 3+ ery/uL Bacteria: Few (10-25/hpf)  pH: 6.0 Protein: 3+ mg/dL Cystals:  Cystine  Glucose: Neg mg/dL Urobilinogen: 0.2 mg/dL Casts: NS (Not Seen)    Nitrites: Neg Trichomonas: Not Present    Leukocyte Esterase: 2+ leu/uL Mucous: Not Present      Epithelial Cells: NS (Not Seen)      Yeast: NS (Not Seen)      Sperm: Not Present    ASSESSMENT:      ICD-10 Details  1 GU:   Renal and ureteral calculus - N20.2 Bilateral  2   Renal colic - N23 Left   PLAN:           Orders Labs Urine Culture  X-Rays: KUB          Schedule         Document Letter(s):  Created for Patient: Clinical Summary         Notes:   KUB today inconclusive, can't exclude the possibility of a distal ureteral stone. Unfortunately I do not have CT imaging today here in clinic to confirm the presence of an obstruction on the left side. Patient with renal colic and  inability to tolerate oral intake. I discussed his case further with the on-call urologist who plans to take the patient tonight for cystoscopy, left retrograde pyelogram, possible ureteroscopy with basket stone extraction/holmium laser lithotripsy, stent placement, and possible removal of the right ureteral stent. Risks and benefits of the procedure discussed in detail. All questions answered to the best of my ability, the patient elects to proceed. A urine culture was sent. He will need follow-up with urologist within the next 1-2 weeks.        Next Appointment:      Next Appointment: 10/06/2017 10:00 AM    Appointment Type: Postoperative Appointment    Location: [facility] [phone]    Provider: Anne Fu    Reason for Visit: Rt RTG URS LL stent/ stent removal     CC/HPI: At last office visit Devin Sanders was found to have 2 obstructing ureteral calculi on the right. He initially underwent stenting and treatment of the distal most calculi with pushing of the proximal stone into the renal pelvis. He then underwent follow-up ureteroscopy with Dr. Liliane Shi approximately 1 week ago. He presents today one day  before scheduled ureteral stent removal here in clinic complaining of 24 hour history of acute left-sided renal colic with nausea and vomiting. He has passed several small stone fragments in the interval including 1 at time of providing a urine sample today here in clinic. Denies fevers but patient has had nausea and vomiting unable to keep any liquids or solid food on the stomach. Voiding at his baseline with only occasional urgency and burning urination. Urinalysis not overly suspicious for infection.     ALLERGIES: Mango - Swelling Morphine - Vomiting, Nausea    MEDICATIONS: Tamsulosin Hcl 0.4 mg capsule  Oxycodone-Acetaminophen  Potassium Citrate Er 15 meq (1,620 mg) tablet, extended release 2 tablet PO BID  Thiola 100 mg tablet 5 tablet PO BID  Zofran     GU PSH: Cysto Uretero Lithotripsy Cystoscopy Insert Stent ESWL PCNL Ureteroscopic laser litho, Right - 09/28/2017, Right - 09/18/2017, Bilateral - 06/26/2017    NON-GU PSH: None   GU PMH: RLQ pain - 09/18/2017 Renal calculus (Chronic), Bilateral, Bilateral nonobstructing stones. No bilateral ureteral calculus noted. He will continue with medical therapy for stone prevention and f/u as discussed in March w/Dr. Berneice Heinrich and PRN. - 04/07/2017 Renal and ureteral calculus (Stable, Chronic), Bilateral - 04/02/2017      PMH Notes: 1 - Recurrent Nephrolithiasis -  Pre 2019 - PCNL several each side, URS x innumerable each side at [facility].  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   Tobacco Use Assessment Completed: Used Tobacco in last 30 days? Does not use smokeless tobacco. Has never drank.  Does not use drugs. Drinks 1 caffeinated drink per day. Has not had a blood transfusion. 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    REVIEW OF SYSTEMS:    GU Review Male:   Patient denies frequent urination, hard to postpone urination, burning/ pain with urination,  get up at night to urinate, leakage of urine, stream starts and stops, trouble starting your stream, have to strain to urinate , erection problems, and penile pain.  Gastrointestinal (Upper):   Patient reports nausea and vomiting. Patient denies indigestion/ heartburn.  Gastrointestinal (Lower):   Patient denies diarrhea and constipation.  Constitutional:   Patient reports fatigue. Patient denies fever, night sweats, and weight loss.  Skin:   Patient denies skin rash/ lesion and itching.  Eyes:   Patient denies blurred vision and double vision.  Ears/ Nose/ Throat:   Patient denies sore throat and sinus problems.  Hematologic/Lymphatic:   Patient denies swollen glands and easy bruising.  Cardiovascular:   Patient denies leg swelling and chest pains.  Respiratory:   Patient denies cough and shortness of breath.  Endocrine:   Patient denies excessive thirst.  Musculoskeletal:   Patient reports back pain. Patient denies joint pain.  Neurological:   Patient denies headaches and dizziness.  Psychologic:   Patient denies depression and anxiety.   VITAL SIGNS:      10/05/2017 04:38 PM  BP 135/97 mmHg  Pulse 87 /min  Temperature 98.0 F / 36.6 C   MULTI-SYSTEM PHYSICAL EXAMINATION:    Constitutional: Well-nourished. No physical deformities. Normally developed. Good grooming.  Neck: Neck symmetrical, not swollen. Normal tracheal position.  Respiratory: No labored breathing, no use of accessory muscles.   Cardiovascular: Normal temperature, normal extremity pulses, no swelling, no varicosities.  Skin:  No paleness, no jaundice, no cyanosis. No lesion, no ulcer, no rash.  Neurologic / Psychiatric: Oriented to time, oriented to place, oriented to person. No depression, no anxiety, no agitation.  Gastrointestinal: Mildly obese abdomen. No mass, no tenderness, no rigidity. Left CVAT.  Musculoskeletal: Normal gait and station of head and neck.     PAST DATA REVIEWED:  Source Of History:  Patient  Records Review:   Previous Doctor Records, Previous Hospital Records, Previous Patient Records  Urine Test Review:   Urinalysis  X-Ray Review: KUB: Reviewed Films. Discussed With Patient.     10/05/17  Urinalysis  Urine Appearance Cloudy   Urine Color Yellow   Urine Glucose Neg mg/dL  Urine Bilirubin Neg mg/dL  Urine Ketones Neg mg/dL  Urine Specific Gravity 1.025   Urine Blood 3+ ery/uL  Urine pH 6.0   Urine Protein 3+ mg/dL  Urine Urobilinogen 0.2 mg/dL  Urine Nitrites Neg   Urine Leukocyte Esterase 2+ leu/uL  Urine WBC/hpf 0 - 5/hpf   Urine RBC/hpf >60/hpf   Urine Epithelial Cells NS (Not Seen)   Urine Bacteria Few (10-25/hpf)   Urine Mucous Not Present   Urine Yeast NS (Not Seen)   Urine Trichomonas Not Present   Urine Cystals Cystine   Urine Casts NS (Not Seen)   Urine Sperm Not Present    PROCEDURES:         KUB - 16109  A single view of the abdomen is obtained.               Urinalysis w/Scope Dipstick Dipstick Cont'd Micro  Color: Yellow Bilirubin: Neg mg/dL WBC/hpf: 0 - 5/hpf  Appearance: Cloudy Ketones: Neg mg/dL RBC/hpf: >60/AVW  Specific Gravity: 1.025 Blood: 3+ ery/uL Bacteria: Few (10-25/hpf)  pH: 6.0 Protein: 3+ mg/dL Cystals: Cystine  Glucose: Neg mg/dL Urobilinogen: 0.2 mg/dL Casts: NS (Not Seen)    Nitrites: Neg Trichomonas: Not Present    Leukocyte Esterase: 2+ leu/uL Mucous: Not Present      Epithelial Cells: NS (Not Seen)  Yeast: NS (Not Seen)      Sperm: Not Present    ASSESSMENT:      ICD-10 Details  1 GU:   Renal and ureteral calculus -  N20.2 Bilateral  2   Renal colic - N23 Left   PLAN:           Orders Labs Urine Culture  X-Rays: KUB          Schedule         Document Letter(s):  Created for Patient: Clinical Summary         Notes:   KUB today inconclusive, can't exclude the possibility of a distal ureteral stone. Unfortunately I do not have CT imaging today here in clinic to confirm the presence of an obstruction on the left side. Patient with renal colic and inability to tolerate oral intake. I discussed his case further with the on-call urologist who plans to take the patient tonight for cystoscopy, left retrograde pyelogram, possible ureteroscopy with basket stone extraction/holmium laser lithotripsy, stent placement, and possible removal of the right ureteral stent. Risks and benefits of the procedure discussed in detail. All questions answered to the best of my ability, the patient elects to proceed. A urine culture was sent. He will need follow-up with urologist within the next 1-2 weeks.        Next Appointment:      Next Appointment: 10/06/2017 10:00 AM    Appointment Type: Postoperative Appointment    Location: [facility] [phone]    Provider: Anne Fu    Reason for Visit: Rt RTG URS LL stent/ stent removal

## 2017-10-05 NOTE — Discharge Instructions (Signed)

## 2017-10-05 NOTE — Transfer of Care (Signed)
Immediate Anesthesia Transfer of Care Note  Patient: Devin Sanders  Procedure(s) Performed: CYSTOSCOPY/RETROGRADE/STONE REMOVAL FROM BLADDER  (Left Ureter)  Patient Location: PACU  Anesthesia Type:General  Level of Consciousness: awake, alert  and oriented  Airway & Oxygen Therapy: Patient Spontanous Breathing and Patient connected to face mask oxygen  Post-op Assessment: Report given to RN and Post -op Vital signs reviewed and stable  Post vital signs: Reviewed and stable  Last Vitals:  Vitals Value Taken Time  BP    Temp    Pulse    Resp    SpO2      Last Pain:  Vitals:   10/05/17 1942  TempSrc:   PainSc: 9       Patients Stated Pain Goal: 6 (10/05/17 1733)  Complications: No apparent anesthesia complications

## 2017-10-05 NOTE — Anesthesia Postprocedure Evaluation (Signed)
Anesthesia Post Note  Patient: Devin Sanders  Procedure(s) Performed: CYSTOSCOPY/RETROGRADE/STONE REMOVAL FROM BLADDER  (Left Ureter)     Patient location during evaluation: PACU Anesthesia Type: General Level of consciousness: awake and alert Pain management: pain level controlled Vital Signs Assessment: post-procedure vital signs reviewed and stable Respiratory status: spontaneous breathing, nonlabored ventilation, respiratory function stable and patient connected to nasal cannula oxygen Cardiovascular status: blood pressure returned to baseline and stable Postop Assessment: no apparent nausea or vomiting Anesthetic complications: no    Last Vitals:  Vitals:   10/05/17 2205 10/05/17 2230  BP: (!) 153/107 (!) 148/90  Pulse: (!) 106 86  Resp: 18 16  Temp:  36.9 C  SpO2: 92% 96%    Last Pain:  Vitals:   10/05/17 2230  TempSrc:   PainSc: 4                  Kennieth Rad

## 2017-10-05 NOTE — Op Note (Signed)
Procedure: 1.  Cystoscopy with removal of right double-J stent. 2.  Left retrograde pyelogram with interpretation. 3.  Cystoscopy with removal of small bladder stones.  Preop diagnosis: 1.  Left flank pain with probable left ureteral stones. 2.  Retained right ureteral stent.  Postop diagnosis: 1.  Interval passage of left ureteral stones in the bladder. 2.  Retained right ureteral stent.  Surgeon: Dr. Bjorn Pippin.  Anesthesia: General.  Specimen: Stones.  Drains: None.  EBL: None.  Complications: None.  Indications: Devin Sanders is a 37 year old white male with a history of cystinuria with recurrent stones.  He had undergone a right ureteroscopic stone extraction last week and has a stent indwelling.  He presented today with acute onset of left flank pain and on office ultrasound had some degree of hydronephrosis.  After reviewing the options he elected to undergo cystoscopy with left retrograde pyelography and possible ureteroscopy as well as removal of the right double-J stent.  Procedure: He was taken operating room was given 2 g of Ancef.  A general anesthetic was induced.  He was placed in lithotomy position and fitted with PAS hose.  His perineum and genitalia were prepped with Betadine solution he was draped in usual sterile fashion.  Cystoscopy was performed using a 23 [place] scope and 30 degree lens.  Examination revealed a normal urethra.  The external sphincter was intact.  The prostatic urethra short without significant obstruction.  Examination of bladder revealed a smooth wall without tumors.  The stent was located at the  right ureteral orifice.  Stent was grasped with a grasping forceps and pulled the urethral meatus and removed.  The cystoscope was reinserted and the left ureteral orifice was inspected again.  There was E flux from the orifice that was clear.  A 5 French opening catheter was inserted and contrast was instilled.  Examination revealed a normal ureter and  intrarenal collecting system drained promptly.  Further inspection of the bladder revealed 3 small stones in the base the bladder that is likely passed from the left ureter.  The stones were aspirated from the bladder and collected for the patient.  His bladder was drained and the cystoscope was removed.  He was taken down from lithotomy position, his anesthetic was reversed and he was moved to recovery room in stable condition.  There were no complications.

## 2017-10-06 ENCOUNTER — Encounter (HOSPITAL_COMMUNITY): Payer: Self-pay | Admitting: Urology

## 2017-10-06 NOTE — Discharge Summary (Signed)
Date of admission: 09/28/2017  Date of discharge: 09/28/2017  Admission diagnosis: Right renal colic with right hydronephrosis on RUS  Discharge diagnosis: Obstructing right ureteral stone  Secondary diagnoses: Cystinuria  Procedures: Cystoscopy with right ureteroscopy, laser lithotripsy and right JJ stent placement    History and Physical: For full details, please see admission history and physical. Briefly, Devin Sanders is a 37 y.o. year old patient with With a history of cystinuria who presented to the emergency department on 09/27/2017 with worsening right-sided flank pain.  He had a renal ultrasound that revealed right-sided hydronephrosis.  The patient recently had right ureteroscopy performed on an obstructing right distal ureteral stone on 09/18/2017.  He had a proximal ureteral stone at that time that was replaced back into the right renal pelvis and left untreated.  Hospital Course: The patient underwent right ureteroscopy, holmium laser lithotripsy and registered a stent placement.  Following the procedure, the patient was discharged home in stable condition.  He is scheduled follow-up with Dr. Berneice Heinrich for stent removal in one week.   Physical Exam:  General: Alert and oriented CV: RRR, palpable distal pulses Lungs: CTAB, equal chest rise Abdomen: Soft, NTND, no rebound or guarding Ext: NT, No erythema  Laboratory values: No results for input(s): HGB, HCT in the last 72 hours. No results for input(s): CREATININE in the last 72 hours.  Disposition: Home  Discharge instruction: The patient was instructed to be ambulatory but told to refrain from heavy lifting, strenuous activity, or driving.  Discharge medications:  Allergies as of 09/28/2017      Reactions   Morphine Nausea And Vomiting   Other Hives, Swelling   MANGO'S      Medication List    STOP taking these medications   cephALEXin 500 MG capsule Commonly known as:  KEFLEX     TAKE these medications    ibuprofen 200 MG tablet Commonly known as:  ADVIL,MOTRIN Take 600 mg by mouth every 6 (six) hours as needed for moderate pain.   ondansetron 4 MG tablet Commonly known as:  ZOFRAN Take 1 tablet (4 mg total) by mouth every 8 (eight) hours as needed.   oxybutynin 5 MG tablet Commonly known as:  DITROPAN Take 1 tablet (5 mg total) by mouth every 8 (eight) hours as needed for bladder spasms.   oxyCODONE-acetaminophen 5-325 MG tablet Commonly known as:  PERCOCET/ROXICET Take 1 tablet by mouth every 6 (six) hours as needed for severe pain.   Potassium Citrate 15 MEQ (1620 MG) Tbcr Take 2 tablets by mouth 2 (two) times daily.   tamsulosin 0.4 MG Caps capsule Commonly known as:  FLOMAX Take 1 capsule (0.4 mg total) by mouth daily. What changed:    when to take this  reasons to take this   THIOLA 100 MG tablet Generic drug:  tiopronin Take 200 mg by mouth 3 (three) times daily.       Followup:  Follow-up Information    [name], MD In 1 week.   Specialty:  Urology Why:  Stent removal Contact information: [address] [place] [address] [phone]

## 2017-10-09 ENCOUNTER — Other Ambulatory Visit: Payer: Self-pay | Admitting: Urology

## 2017-10-12 ENCOUNTER — Encounter (HOSPITAL_COMMUNITY): Payer: Self-pay | Admitting: *Deleted

## 2017-10-12 ENCOUNTER — Other Ambulatory Visit: Payer: Self-pay

## 2017-10-12 MED ORDER — GENTAMICIN SULFATE 40 MG/ML IJ SOLN
5.0000 mg/kg | INTRAVENOUS | Status: AC
  Administered 2017-10-13: 420 mg via INTRAVENOUS
  Filled 2017-10-12: qty 10.5

## 2017-10-13 ENCOUNTER — Ambulatory Visit (HOSPITAL_COMMUNITY): Payer: 59 | Admitting: Certified Registered Nurse Anesthetist

## 2017-10-13 ENCOUNTER — Other Ambulatory Visit: Payer: Self-pay

## 2017-10-13 ENCOUNTER — Ambulatory Visit (HOSPITAL_COMMUNITY): Payer: 59

## 2017-10-13 ENCOUNTER — Encounter (HOSPITAL_COMMUNITY): Payer: Self-pay | Admitting: *Deleted

## 2017-10-13 ENCOUNTER — Encounter (HOSPITAL_COMMUNITY)
Admission: RE | Disposition: A | Discharge status: Home / Self Care | Payer: Self-pay | Source: Ambulatory Visit | Attending: Urology

## 2017-10-13 ENCOUNTER — Ambulatory Visit (HOSPITAL_COMMUNITY)
Admission: RE | Admit: 2017-10-13 | Discharge: 2017-10-13 | Disposition: A | Discharge status: Home / Self Care | Payer: 59 | Source: Ambulatory Visit | Attending: Urology | Admitting: Urology

## 2017-10-13 DIAGNOSIS — E669 Obesity, unspecified: Secondary | ICD-10-CM | POA: Insufficient documentation

## 2017-10-13 DIAGNOSIS — N202 Calculus of kidney with calculus of ureter: Secondary | ICD-10-CM | POA: Insufficient documentation

## 2017-10-13 DIAGNOSIS — Z79899 Other long term (current) drug therapy: Secondary | ICD-10-CM | POA: Insufficient documentation

## 2017-10-13 DIAGNOSIS — E7201 Cystinuria: Secondary | ICD-10-CM | POA: Insufficient documentation

## 2017-10-13 DIAGNOSIS — Z6831 Body mass index (BMI) 31.0-31.9, adult: Secondary | ICD-10-CM | POA: Insufficient documentation

## 2017-10-13 DIAGNOSIS — Z87442 Personal history of urinary calculi: Secondary | ICD-10-CM | POA: Diagnosis not present

## 2017-10-13 HISTORY — PX: CYSTOSCOPY WITH RETROGRADE PYELOGRAM, URETEROSCOPY AND STENT PLACEMENT: SHX5789

## 2017-10-13 HISTORY — PX: HOLMIUM LASER APPLICATION: SHX5852

## 2017-10-13 LAB — BASIC METABOLIC PANEL
ANION GAP: 12 (ref 5–15)
BUN: 20 mg/dL (ref 6–20)
CALCIUM: 10.1 mg/dL (ref 8.9–10.3)
CHLORIDE: 105 mmol/L (ref 98–111)
CO2: 25 mmol/L (ref 22–32)
Creatinine, Ser: 1.24 mg/dL (ref 0.61–1.24)
GFR calc non Af Amer: >60 mL/min (ref >60)
GLUCOSE: 80 mg/dL (ref 70–99)
POTASSIUM: 4.4 mmol/L (ref 3.5–5.1)
Sodium: 142 mmol/L (ref 135–145)

## 2017-10-13 LAB — CBC
HCT: 39.7 % (ref 39.0–52.0)
HEMOGLOBIN: 13.5 g/dL (ref 13.0–17.0)
MCH: 32 pg (ref 26.0–34.0)
MCHC: 34 g/dL (ref 30.0–36.0)
MCV: 94.1 fL (ref 80.0–100.0)
Platelets: 319 10*3/uL (ref 150–400)
RBC: 4.22 MIL/uL (ref 4.22–5.81)
RDW: 11.2 % — AB (ref 11.5–15.5)
WBC: 6.5 10*3/uL (ref 4.0–10.5)
nRBC: 0 % (ref 0.0–0.2)

## 2017-10-13 SURGERY — CYSTOURETEROSCOPY, WITH RETROGRADE PYELOGRAM AND STENT INSERTION
Anesthesia: General | Laterality: Bilateral

## 2017-10-13 MED ORDER — OXYCODONE-ACETAMINOPHEN 5-325 MG PO TABS: 1.0000 | ORAL_TABLET | Freq: Four times a day (QID) | ORAL | 0 refills | Status: DC | PRN

## 2017-10-13 MED ORDER — FENTANYL CITRATE (PF) 100 MCG/2ML IJ SOLN
INTRAMUSCULAR | Status: AC
  Administered 2017-10-13: 50 ug via INTRAVENOUS
  Filled 2017-10-13: qty 2

## 2017-10-13 MED ORDER — ONDANSETRON HCL 4 MG/2ML IJ SOLN
INTRAMUSCULAR | Status: AC
  Filled 2017-10-13: qty 2

## 2017-10-13 MED ORDER — FENTANYL CITRATE (PF) 100 MCG/2ML IJ SOLN
INTRAMUSCULAR | Status: AC
  Filled 2017-10-13: qty 2

## 2017-10-13 MED ORDER — ONDANSETRON HCL 4 MG/2ML IJ SOLN
INTRAMUSCULAR | Status: DC | PRN
  Administered 2017-10-13: 4 mg via INTRAVENOUS

## 2017-10-13 MED ORDER — CEPHALEXIN 500 MG PO CAPS: 500.0000 mg | ORAL_CAPSULE | Freq: Two times a day (BID) | ORAL | 0 refills | Status: DC

## 2017-10-13 MED ORDER — OXYCODONE HCL 5 MG/5ML PO SOLN
5.0000 mg | Freq: Once | ORAL | Status: AC | PRN
  Filled 2017-10-13: qty 5

## 2017-10-13 MED ORDER — ONDANSETRON HCL 4 MG/2ML IJ SOLN: 4.0000 mg | Freq: Once | INTRAMUSCULAR | Status: DC | PRN

## 2017-10-13 MED ORDER — PROPOFOL 10 MG/ML IV BOLUS
INTRAVENOUS | Status: AC
  Filled 2017-10-13: qty 20

## 2017-10-13 MED ORDER — MIDAZOLAM HCL 2 MG/2ML IJ SOLN
INTRAMUSCULAR | Status: AC
  Filled 2017-10-13: qty 2

## 2017-10-13 MED ORDER — DEXAMETHASONE SODIUM PHOSPHATE 10 MG/ML IJ SOLN
INTRAMUSCULAR | Status: AC
  Filled 2017-10-13: qty 1

## 2017-10-13 MED ORDER — FENTANYL CITRATE (PF) 100 MCG/2ML IJ SOLN
INTRAMUSCULAR | Status: DC | PRN
  Administered 2017-10-13 (×4): 25 ug via INTRAVENOUS

## 2017-10-13 MED ORDER — LIDOCAINE HCL (CARDIAC) PF 100 MG/5ML IV SOSY
PREFILLED_SYRINGE | INTRAVENOUS | Status: AC
  Filled 2017-10-13: qty 5

## 2017-10-13 MED ORDER — FENTANYL CITRATE (PF) 100 MCG/2ML IJ SOLN
25.0000 ug | INTRAMUSCULAR | Status: DC | PRN
  Administered 2017-10-13 (×2): 50 ug via INTRAVENOUS

## 2017-10-13 MED ORDER — 0.9 % SODIUM CHLORIDE (POUR BTL) OPTIME
TOPICAL | Status: DC | PRN
  Administered 2017-10-13: 1000 mL

## 2017-10-13 MED ORDER — OXYCODONE HCL 5 MG PO TABS
5.0000 mg | ORAL_TABLET | Freq: Once | ORAL | Status: AC | PRN
  Administered 2017-10-13: 5 mg via ORAL

## 2017-10-13 MED ORDER — DEXAMETHASONE SODIUM PHOSPHATE 10 MG/ML IJ SOLN
INTRAMUSCULAR | Status: DC | PRN
  Administered 2017-10-13: 10 mg via INTRAVENOUS

## 2017-10-13 MED ORDER — KETOROLAC TROMETHAMINE 10 MG PO TABS: 10.0000 mg | ORAL_TABLET | Freq: Three times a day (TID) | ORAL | 0 refills | Status: DC | PRN

## 2017-10-13 MED ORDER — LACTATED RINGERS IV SOLN
INTRAVENOUS | Status: DC
  Administered 2017-10-13: 15:00:00 via INTRAVENOUS

## 2017-10-13 MED ORDER — SODIUM CHLORIDE 0.9 % IV SOLN
INTRAVENOUS | Status: DC | PRN
  Administered 2017-10-13: 35 mL

## 2017-10-13 MED ORDER — MIDAZOLAM HCL 2 MG/2ML IJ SOLN
INTRAMUSCULAR | Status: DC | PRN
  Administered 2017-10-13: 2 mg via INTRAVENOUS

## 2017-10-13 MED ORDER — PROPOFOL 10 MG/ML IV BOLUS
INTRAVENOUS | Status: DC | PRN
  Administered 2017-10-13: 200 mg via INTRAVENOUS

## 2017-10-13 MED ORDER — SODIUM CHLORIDE 0.9 % IR SOLN
Status: DC | PRN
  Administered 2017-10-13: 6000 mL

## 2017-10-13 MED ORDER — OXYCODONE HCL 5 MG PO TABS
ORAL_TABLET | ORAL | Status: AC
  Administered 2017-10-13: 5 mg via ORAL
  Filled 2017-10-13: qty 1

## 2017-10-13 SURGICAL SUPPLY — 29 items
BAG URO CATCHER STRL LF (MISCELLANEOUS) ×2 IMPLANT
BASKET LASER NITINOL 1.9FR (BASKET) IMPLANT
BASKET STONE NCOMPASS (UROLOGICAL SUPPLIES) ×1 IMPLANT
BSKT STON RTRVL 120 1.9FR (BASKET)
CATH INTERMIT  6FR 70CM (CATHETERS) ×2 IMPLANT
CLOTH BEACON ORANGE TIMEOUT ST (SAFETY) ×2 IMPLANT
COVER SURGICAL LIGHT HANDLE (MISCELLANEOUS) ×2 IMPLANT
EXTRACTOR STONE 1.7FRX115CM (UROLOGICAL SUPPLIES) IMPLANT
FIBER LASER FLEXIVA 1000 (UROLOGICAL SUPPLIES) IMPLANT
FIBER LASER FLEXIVA 365 (UROLOGICAL SUPPLIES) IMPLANT
FIBER LASER FLEXIVA 550 (UROLOGICAL SUPPLIES) IMPLANT
FIBER LASER TRAC TIP (UROLOGICAL SUPPLIES) ×1 IMPLANT
GLOVE BIOGEL M STRL SZ7.5 (GLOVE) ×2 IMPLANT
GLOVE BIOGEL PI IND STRL 7.5 (GLOVE) IMPLANT
GLOVE BIOGEL PI INDICATOR 7.5 (GLOVE) ×3
GOWN STRL REUS W/TWL LRG LVL3 (GOWN DISPOSABLE) ×4 IMPLANT
GUIDEWIRE ANG ZIPWIRE 038X150 (WIRE) ×3 IMPLANT
GUIDEWIRE STR DUAL SENSOR (WIRE) ×3 IMPLANT
IV NS 1000ML (IV SOLUTION) ×2
IV NS 1000ML BAXH (IV SOLUTION) ×1 IMPLANT
MANIFOLD NEPTUNE II (INSTRUMENTS) ×2 IMPLANT
PACK CYSTO (CUSTOM PROCEDURE TRAY) ×2 IMPLANT
SHEATH URETERAL 12FRX28CM (UROLOGICAL SUPPLIES) IMPLANT
SHEATH URETERAL 12FRX35CM (MISCELLANEOUS) ×1 IMPLANT
STENT POLARIS 5FRX24 (STENTS) ×2 IMPLANT
SYR CONTROL 10ML LL (SYRINGE) ×2 IMPLANT
TUBE FEEDING 8FR 16IN STR KANG (MISCELLANEOUS) ×2 IMPLANT
TUBING CONNECTING 10 (TUBING) ×2 IMPLANT
TUBING UROLOGY SET (TUBING) ×1 IMPLANT

## 2017-10-13 NOTE — Transfer of Care (Signed)
Immediate Anesthesia Transfer of Care Note  Patient: Devin Sanders  Procedure(s) Performed: Procedure(s) with comments: CYSTOSCOPY WITH RETROGRADE PYELOGRAM, BILATERAL URETEROSCOPY AND BILATERAL STENT PLACEMENT, LASER (Bilateral) - 90 MINS HOLMIUM LASER APPLICATION (Bilateral)  Patient Location: PACU  Anesthesia Type:General  Level of Consciousness:  sedated, patient cooperative and responds to stimulation  Airway & Oxygen Therapy:Patient Spontanous Breathing and Patient connected to face mask oxgen  Post-op Assessment:  Report given to PACU RN and Post -op Vital signs reviewed and stable  Post vital signs:  Reviewed and stable  Last Vitals:  Vitals:   10/13/17 1445 10/13/17 1815  BP: (!) 141/111 (P) 129/89  Pulse: 85   Resp: 16   Temp: 36.7 C   SpO2: 98%     Complications: No apparent anesthesia complications

## 2017-10-13 NOTE — Progress Notes (Signed)
Contacted Dr Berneice Heinrich in regards to pts request for pain medication. No orders given at this time.

## 2017-10-13 NOTE — Brief Op Note (Signed)
10/13/2017  6:02 PM  PATIENT:  Devin Sanders  37 y.o. male  PRE-OPERATIVE DIAGNOSIS:  BILATERAL RENAL STONES , CYSTEINURIA  POST-OPERATIVE DIAGNOSIS:  BILATERAL RENAL STONES , CYSTEINURIA  PROCEDURE:  Procedure(s) with comments: CYSTOSCOPY WITH RETROGRADE PYELOGRAM, BILATERAL URETEROSCOPY AND BILATERAL STENT PLACEMENT, LASER (Bilateral) - 90 MINS HOLMIUM LASER APPLICATION (Bilateral)  SURGEON:  Surgeon(s) and Role:    [name], MD - Primary  PHYSICIAN ASSISTANT:   ASSISTANTS: none   ANESTHESIA:   general  EBL:  minimal   BLOOD ADMINISTERED:none  DRAINS: none   LOCAL MEDICATIONS USED:  NONE  SPECIMEN:  Source of Specimen:  bilateral renal / left ureteral stone fragments  DISPOSITION OF SPECIMEN:  discard  COUNTS:  YES  TOURNIQUET:  * No tourniquets in log *  DICTATION: .Other Dictation: Dictation Number [id number]  PLAN OF CARE: Discharge to home after PACU  PATIENT DISPOSITION:  PACU - hemodynamically stable.   Delay start of Pharmacological VTE agent (>24hrs) due to surgical blood loss or risk of bleeding: yes

## 2017-10-13 NOTE — Anesthesia Postprocedure Evaluation (Signed)
Anesthesia Post Note  Patient: CALLAWAY HAILES  Procedure(s) Performed: CYSTOSCOPY WITH RETROGRADE PYELOGRAM, BILATERAL URETEROSCOPY AND BILATERAL STENT PLACEMENT, LASER (Bilateral ) HOLMIUM LASER APPLICATION (Bilateral )     Patient location during evaluation: PACU Anesthesia Type: General Level of consciousness: awake and alert Pain management: pain level controlled Vital Signs Assessment: post-procedure vital signs reviewed and stable Respiratory status: spontaneous breathing, nonlabored ventilation, respiratory function stable and patient connected to nasal cannula oxygen Cardiovascular status: blood pressure returned to baseline and stable Postop Assessment: no apparent nausea or vomiting Anesthetic complications: no    Last Vitals:  Vitals:   10/13/17 1445 10/13/17 1815  BP: (!) 141/111 129/89  Pulse: 85   Resp: 16   Temp: 36.7 C 37 C  SpO2: 98%     Last Pain:  Vitals:   10/13/17 1825  TempSrc:   PainSc: (P) 8                  Lucretia Kern

## 2017-10-13 NOTE — Anesthesia Preprocedure Evaluation (Addendum)
Anesthesia Evaluation  Patient identified by MRN, date of birth, ID band Patient awake    Reviewed: Allergy & Precautions, NPO status , Patient's Chart, lab work & pertinent test results  History of Anesthesia Complications Negative for: history of anesthetic complications  Airway Mallampati: II  TM Distance: >3 FB Neck ROM: Full    Dental no notable dental hx.    Pulmonary neg pulmonary ROS,    Pulmonary exam normal        Cardiovascular negative cardio ROS Normal cardiovascular exam     Neuro/Psych negative neurological ROS  negative psych ROS   GI/Hepatic Neg liver ROS, GERD  ,  Endo/Other  negative endocrine ROS  Renal/GU Renal disease (obstructive uropathy 2/2 kidney stones)  negative genitourinary   Musculoskeletal negative musculoskeletal ROS (+)   Abdominal   Peds  Hematology negative hematology ROS (+)   Anesthesia Other Findings   Reproductive/Obstetrics                            Anesthesia Physical Anesthesia Plan  ASA: II  Anesthesia Plan: General   Post-op Pain Management:    Induction: Intravenous  PONV Risk Score and Plan: 3 and Ondansetron, Dexamethasone and Treatment may vary due to age or medical condition  Airway Management Planned: LMA  Additional Equipment: None  Intra-op Plan:   Post-operative Plan: Extubation in OR  Informed Consent: I have reviewed the patients History and Physical, chart, labs and discussed the procedure including the risks, benefits and alternatives for the proposed anesthesia with the patient or authorized representative who has indicated his/her understanding and acceptance.     Plan Discussed with:   Anesthesia Plan Comments:         Anesthesia Quick Evaluation

## 2017-10-13 NOTE — Anesthesia Procedure Notes (Signed)
Procedure Name: LMA Insertion Date/Time: 10/13/2017 4:45 PM Performed by: Paris Lore, CRNA Pre-anesthesia Checklist: Patient identified, Emergency Drugs available, Suction available, Patient being monitored and Timeout performed Patient Re-evaluated:Patient Re-evaluated prior to induction Oxygen Delivery Method: Circle system utilized Preoxygenation: Pre-oxygenation with 100% oxygen Induction Type: IV induction Ventilation: Mask ventilation without difficulty LMA: LMA inserted LMA Size: 4.0 Number of attempts: 1 Placement Confirmation: positive ETCO2 and breath sounds checked- equal and bilateral Tube secured with: Tape

## 2017-10-13 NOTE — Discharge Instructions (Addendum)
1 - You may have urinary urgency (bladder spasms) and bloody urine on / off with stent in place. This is normal.  2 - Removed tethered stents on Thursday morning at home by pulling on string, then blue-white plastic tubing, and discarding. Office is open Thursday if any problems arise.   3 -  Call MD or go to ER for fever >102, severe pain / nausea / vomiting not relieved by medications, or acute change in medical status  General Anesthesia, Adult, Care After These instructions provide you with information about caring for yourself after your procedure. Your health care provider may also give you more specific instructions. Your treatment has been planned according to current medical practices, but problems sometimes occur. Call your health care provider if you have any problems or questions after your procedure. What can I expect after the procedure? After the procedure, it is common to have:  Vomiting.  A sore throat.  Mental slowness.  It is common to feel:  Nauseous.  Cold or shivery.  Sleepy.  Tired.  Sore or achy, even in parts of your body where you did not have surgery.  Follow these instructions at home: For at least 24 hours after the procedure:  Do not: ? Participate in activities where you could fall or become injured. ? Drive. ? Use heavy machinery. ? Drink alcohol. ? Take sleeping pills or medicines that cause drowsiness. ? Make important decisions or sign legal documents. ? Take care of children on your own.  Rest. Eating and drinking  If you vomit, drink water, juice, or soup when you can drink without vomiting.  Drink enough fluid to keep your urine clear or pale yellow.  Make sure you have little or no nausea before eating solid foods.  Follow the diet recommended by your health care provider. General instructions  Have a responsible adult stay with you until you are awake and alert.  Return to your normal activities as told by your health care  provider. Ask your health care provider what activities are safe for you.  Take over-the-counter and prescription medicines only as told by your health care provider.  If you smoke, do not smoke without supervision.  Keep all follow-up visits as told by your health care provider. This is important. Contact a health care provider if:  You continue to have nausea or vomiting at home, and medicines are not helpful.  You cannot drink fluids or start eating again.  You cannot urinate after 8-12 hours.  You develop a skin rash.  You have fever.  You have increasing redness at the site of your procedure. Get help right away if:  You have difficulty breathing.  You have chest pain.  You have unexpected bleeding.  You feel that you are having a life-threatening or urgent problem. This information is not intended to replace advice given to you by your health care provider. Make sure you discuss any questions you have with your health care provider. Document Released: 03/31/2000 Document Revised: 05/28/2015 Document Reviewed: 12/07/2014 Elsevier Interactive Patient Education  [employer].

## 2017-10-13 NOTE — H&P (Signed)
Devin Sanders is an 37 y.o. male.    Chief Complaint: Pre-Op BILATERAL Ureteroscopic Stone Manipulation  HPI:   1 - Recurrent Nephrolithiasis -  Pre 2019 - PCNL several each side, URS x innumerable each side at [facility].  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 CMP, CBC 02/2017 normal, Cr 0.9.   PMH sig for mild obesity. 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 His PCP is [name] MD.   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 Continues to have on / off left flank pain . NO interval fevers. Most recent UA without infectious parameters.    Past Medical History:  Diagnosis Date  . GERD (gastroesophageal reflux disease)   . History of kidney stones   . Renal calculi    bilateral   . Wears glasses     Past Surgical History:  Procedure Laterality Date  . CYSTOSCOPY WITH RETROGRADE PYELOGRAM, URETEROSCOPY AND STENT PLACEMENT Bilateral 06/26/2017   Procedure: CYSTOSCOPY WITH RETROGRADE PYELOGRAM, URETEROSCOPY, STONE BASKETRY  AND STENT PLACEMENT;  Surgeon: [name], MD;  Location: [facility];  Service: Urology;  Laterality: Bilateral;  . CYSTOSCOPY WITH RETROGRADE PYELOGRAM, URETEROSCOPY AND STENT PLACEMENT Right 09/18/2017   Procedure: CYSTOSCOPY WITH RETROGRADE PYELOGRAM, URETEROSCOPY AND STENT PLACEMENT;  Surgeon: Crist Fat, MD;  Location: [facility];  Service: Urology;  Laterality: Right;  . CYSTOSCOPY/RETROGRADE/URETEROSCOPY/STONE EXTRACTION WITH BASKET Left 10/05/2017   Procedure: CYSTOSCOPY/RETROGRADE/STONE REMOVAL FROM BLADDER ;  Surgeon: Bjorn Pippin, MD;  Location:  [facility];  Service: Urology;  Laterality: Left;  . CYSTOSCOPY/URETEROSCOPY/HOLMIUM LASER/STENT PLACEMENT Right 09/28/2017   Procedure: CYSTOSCOPY/RIGHT RETROGRADE URETEROSCOPY/HOLMIUM LASER/ RIGHT STENT PLACEMENT;  Surgeon: Rene Paci, MD;  Location: [facility];  Service: Urology;  Laterality: Right;  . HOLMIUM LASER APPLICATION Bilateral 06/26/2017   Procedure: HOLMIUM LASER APPLICATION;  Surgeon: [name], MD;  Location: [facility];  Service: Urology;  Laterality: Bilateral;  . HOLMIUM LASER APPLICATION Right 09/18/2017   Procedure: HOLMIUM LASER APPLICATION;  Surgeon: Crist Fat, MD;  Location: [facility];  Service: Urology;  Laterality: Right;  . PERCUTANEOUS NEPHROSTOLITHOTOMY  2003;  2009;  06-22-2014  @ [facility]  . URETEROSCOPIC STONE MANIPULATION UNILATERAL  multiple since 1997--  last one 02-19-2017  @ [facility] by dr [email or web address]    History reviewed. No pertinent family history. Social History:  reports that he has never smoked. He has never used smokeless tobacco. He reports that he does not drink alcohol or use drugs.  Allergies:  Allergies  Allergen Reactions  . Morphine Nausea And Vomiting  . Other Hives and Swelling    MANGO'S    No medications prior to admission.    No results found for this or any previous visit (from the past 48 hour(s)). No results found.  Review of Systems  Eyes: Negative.   Respiratory: Negative.   Cardiovascular: Negative.   Gastrointestinal: Negative.   Genitourinary: Positive for flank pain.  Neurological: Negative.   Endo/Heme/Allergies: Negative.   Psychiatric/Behavioral: Negative.     There were no vitals taken for this visit.  Physical Exam  Constitutional: He appears well-developed.  HENT:  Head: Normocephalic.  Eyes: Pupils are equal, round, and reactive to light.  Cardiovascular: Normal rate.  GI: Soft.  Stable mild obesity.   Genitourinary:  Genitourinary Comments: Minimal left CVAT at  presetn.   Musculoskeletal: Normal range of motion.  Neurological: He is alert.  Skin: Skin is warm.  Psychiatric: He has a normal mood and affect.     Assessment/Plan  Proceed as planned with BILATERAL ureteroscopic stone disease. Risks, benefits, alternatives, expected peri-op course dicussed previously and reiterated today.   [name], MD 10/13/2017, 8:08 AM

## 2017-10-14 ENCOUNTER — Encounter (HOSPITAL_COMMUNITY): Payer: Self-pay | Admitting: Urology

## 2017-10-14 NOTE — Op Note (Signed)
NAME: Devin Sanders, TWILLEY MEDICAL RECORD ZO:[id number] ACCOUNT [id number] DATE OF BIRTH:1980/12/18 FACILITY: [facility] LOCATION: [facility] PHYSICIAN:Isaura Schiller, MD  OPERATIVE REPORT  DATE OF PROCEDURE:  10/13/2017  PREOPERATIVE DIAGNOSIS:  Cystinuria, bilateral recurrent renal and left ureteral stone.  PROCEDURE: 1.  Cystoscopy, bilateral pyelograms, interpretation. 2.  Bilateral ureteroscopy with laser lithotripsy. 3.  Insertion of bilateral ureteral stents 5 x 24 Polaris with tether.  ESTIMATED BLOOD LOSS:  Nil.  COMPLICATIONS:  None.  SPECIMENS:  Bilateral renal and left ureteral stone fragments for discard.  FINDINGS: 1.  Approximately 6 mm, volume right renal stone, papillary tip upper mid pole. 2.  Small left UPJ stone estimated 4 mm. 3.  Large left upper pole conglomerate of stone estimated 1.2 cm sq. 4.  Complete resolution of all accessible stone fragments larger than one-third mm following laser lithotripsy and basket extraction bilaterally. 5.  Successful placement of bilateral ureteral stents proximally renal pelvis, distally in the bladder.  INDICATIONS:  The patient is a very pleasant but unfortunate 37 year old gentleman with history of severe cystinuria.  He is very compliant with medical therapy, but unfortunately forms stones still at a very rapid rate.  He has had ureteroscopy  innumerable times throughout his lifetime.  He had a bilateral ureteroscopic cleanout in June of this year and began recurring with clinical stones in late September.  He has had 2 ureteroscopies recently in the acute setting.  Given his recurrence  pattern, it was felt that most appropriate means of management would be bilateral ureteroscopic cleanout in the elective setting and he wished to proceed.  Informed consent was obtained and placed in medical record.  DESCRIPTION OF PROCEDURE:  The patient was identified. The procedure being bilateral ureteroscopic stone ablation was confirmed.   Procedure timeout was performed.  Intravenous access administered, general LMA anesthesia induced.  The patient was placed  into a low lithotomy position, sterile field was created prepped and draped his penis, perineum and proximal thighs using iodine.  Cystourethroscopy was performed with a 22-French rigid cystoscope vessel lens.  Inspection of the anterior and posterior  urethra were unremarkable.  Inspection of bladder revealed no diverticula, calcifications lesions.  There was iridescent stone material in the urine consistent with known cystinuria.  The right ureteral orifice was cannulated with a 6-French end-hole  catheter and right retrograde pyelogram was obtained.  Right retrograde pyelogram demonstrated a single right ureter single system right kidney.  No filling defects or narrowing noted.  A 0.038 ZIPwire was advanced to the upper pole and set aside as a safety wire.  Similarly, left retrograde pyelogram was  obtained.  Left ventriculogram demonstrated a single left ureter single system left kidney.  There also appeared to be mobile filling defect in the proximal ureter consistent with likely ureteral stone.  There was mild hydroureteronephrosis above this point.  A  0.038 ZIPwire was advanced to lower pole and set aside as a safety wire.  An 8-French feeding tube was placed in the urinary bladder for pressure release, and semirigid ureteroscopy was performed alongside a separate sensor working wire of the distal  fourth distal right ureter.  No mucosal abnormalities were found.  Similarly semirigid ureteroscopy was performed of the distal fistula left ureter over a sensor working wire.  No mucosal abnormalities were found.  A 12/14, 38 cm ureteral access sheath  was then carefully placed over the sensor working wire on the right side to the level of the proximal ureter using continuous fluoroscopic guidance and flexible digital  ureteroscopy performed the proximal ureter and systematic  inspection of the right  kidney, including all calices x3.  There was a dominant upper mid pole papillary tip calcification and estimated approximately 6 mm and this appeared to be too large for simple basketing.  As such, the holmium laser energy applied 70 setting of 0.2  joules and 30 Hz and approximately 80% of the volume was ablated in a dusting type fashion.  The remaining 20% volume was amenable to basketing with an NCompass type basket.  This completely cleared the right kidney of all accessible stone fragments  larger than one-third millimeter.  The access sheath was removed under continuous vision, no mucosal abnormalities were found.  Similarly, the access sheath was then placed over the left sensor working wire to the proximal left ureter using continuous  fluoroscopic guidance.  Proximal ureteroscopy was performed of the left ureter.  On systematic inspection of the left kidney, there was indeed a proximal ureteral stone.  It was retrograde positioned into the renal pelvis.  There was multifocal left  upper pole renal recurrence, fairly large estimated to be approximately 1.2 cm or so.  This was much too large for simple basketing.  As the goal today was to achieve stone free status, holmium laser energy applied to the stones using settings of 0.2  joules and 30 Hz and approximately 70-80% of stone volume was ablated using a dusting type fashion.  The remaining 20% degenerated into small fragments and a NCompass basket was then used to retrieve all accessible stone fragments, which were then set  aside for discard.  Following this, excellent hemostasis.  No evidence of renal perforation.  There was complete resolution of all accessible stone fragments larger than one-third millimeter bilaterally.  The left ureteral access sheath was then removed  under continuous vision, no mucosal abnormalities were found.  Given the bilateral nature of the procedure today, especially using being bilateral  access sheath, it was felt that brief interval stenting with tethered stent would be warranted.  As such, a  new 5 x 24 Polaris-type stent was placed bilaterally over the remaining safety wire using fluoroscopic guidance.  Good proximal and distal points were noted.  Tethers were left in place and fashioned to the dorsum of the penis and the procedure was  terminated.  The patient tolerated the procedure well.  No immediate peri-procedural complications.  The patient was taken to the postanesthesia care in stable condition.  Plan for discharge home tonight.  TN/NUANCE  D:10/13/2017 T:10/14/2017 JOB:[id number]

## 2018-01-11 ENCOUNTER — Other Ambulatory Visit: Payer: Self-pay | Admitting: Urology

## 2018-01-12 ENCOUNTER — Encounter (HOSPITAL_BASED_OUTPATIENT_CLINIC_OR_DEPARTMENT_OTHER): Payer: Self-pay | Admitting: *Deleted

## 2018-01-12 ENCOUNTER — Other Ambulatory Visit: Payer: Self-pay

## 2018-01-12 NOTE — Progress Notes (Signed)
Spoke with Xzander Npo after midnight food, clear liquids from midnight until 800am then npo. 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

## 2018-01-27 ENCOUNTER — Encounter (HOSPITAL_BASED_OUTPATIENT_CLINIC_OR_DEPARTMENT_OTHER)
Admission: RE | Disposition: A | Discharge status: Home / Self Care | Payer: Self-pay | Source: Home / Self Care | Attending: Urology

## 2018-01-27 ENCOUNTER — Ambulatory Visit (HOSPITAL_BASED_OUTPATIENT_CLINIC_OR_DEPARTMENT_OTHER)
Admission: RE | Admit: 2018-01-27 | Discharge: 2018-01-27 | Disposition: A | Discharge status: Home / Self Care | Payer: 59 | Attending: Urology | Admitting: Urology

## 2018-01-27 ENCOUNTER — Encounter (HOSPITAL_BASED_OUTPATIENT_CLINIC_OR_DEPARTMENT_OTHER): Payer: Self-pay | Admitting: *Deleted

## 2018-01-27 ENCOUNTER — Ambulatory Visit (HOSPITAL_BASED_OUTPATIENT_CLINIC_OR_DEPARTMENT_OTHER): Payer: 59 | Admitting: Anesthesiology

## 2018-01-27 DIAGNOSIS — E7201 Cystinuria: Secondary | ICD-10-CM | POA: Insufficient documentation

## 2018-01-27 DIAGNOSIS — K219 Gastro-esophageal reflux disease without esophagitis: Secondary | ICD-10-CM | POA: Insufficient documentation

## 2018-01-27 DIAGNOSIS — Z87442 Personal history of urinary calculi: Secondary | ICD-10-CM | POA: Insufficient documentation

## 2018-01-27 DIAGNOSIS — Z79899 Other long term (current) drug therapy: Secondary | ICD-10-CM | POA: Insufficient documentation

## 2018-01-27 DIAGNOSIS — N200 Calculus of kidney: Secondary | ICD-10-CM | POA: Insufficient documentation

## 2018-01-27 HISTORY — PX: CYSTOSCOPY/URETEROSCOPY/HOLMIUM LASER/STENT PLACEMENT: SHX6546

## 2018-01-27 HISTORY — PX: HOLMIUM LASER APPLICATION: SHX5852

## 2018-01-27 LAB — POCT I-STAT 4, (NA,K, GLUC, HGB,HCT)
Glucose, Bld: 105 mg/dL — ABNORMAL HIGH (ref 70–99)
HCT: 41 % (ref 39.0–52.0)
Hemoglobin: 13.9 g/dL (ref 13.0–17.0)
Potassium: 3.8 mmol/L (ref 3.5–5.1)
Sodium: 141 mmol/L (ref 135–145)

## 2018-01-27 LAB — POCT I-STAT CREATININE: Creatinine, Ser: 0.9 mg/dL (ref 0.61–1.24)

## 2018-01-27 SURGERY — CYSTOSCOPY/URETEROSCOPY/HOLMIUM LASER/STENT PLACEMENT
Anesthesia: General | Site: Renal | Laterality: Bilateral

## 2018-01-27 MED ORDER — CEFAZOLIN SODIUM-DEXTROSE 2-4 GM/100ML-% IV SOLN
INTRAVENOUS | Status: AC
  Filled 2018-01-27: qty 100

## 2018-01-27 MED ORDER — PROPOFOL 10 MG/ML IV BOLUS
INTRAVENOUS | Status: AC
  Filled 2018-01-27: qty 20

## 2018-01-27 MED ORDER — OXYCODONE-ACETAMINOPHEN 5-325 MG PO TABS
ORAL_TABLET | ORAL | Status: AC
  Filled 2018-01-27: qty 1

## 2018-01-27 MED ORDER — GENTAMICIN SULFATE 40 MG/ML IJ SOLN
5.0000 mg/kg | INTRAVENOUS | Status: AC
  Administered 2018-01-27: 430 mg via INTRAVENOUS
  Filled 2018-01-27 (×2): qty 10.75

## 2018-01-27 MED ORDER — ONDANSETRON HCL 4 MG/2ML IJ SOLN
INTRAMUSCULAR | Status: DC | PRN
  Administered 2018-01-27: 4 mg via INTRAVENOUS

## 2018-01-27 MED ORDER — OXYCODONE-ACETAMINOPHEN 5-325 MG PO TABS
1.0000 | ORAL_TABLET | Freq: Once | ORAL | Status: AC
  Administered 2018-01-27: 1 via ORAL
  Filled 2018-01-27: qty 1

## 2018-01-27 MED ORDER — OXYCODONE-ACETAMINOPHEN 5-325 MG PO TABS: 1.0000 | ORAL_TABLET | Freq: Four times a day (QID) | ORAL | 0 refills | Status: DC | PRN

## 2018-01-27 MED ORDER — PROMETHAZINE HCL 25 MG/ML IJ SOLN
6.2500 mg | INTRAMUSCULAR | Status: DC | PRN
  Filled 2018-01-27: qty 1

## 2018-01-27 MED ORDER — DEXAMETHASONE SODIUM PHOSPHATE 10 MG/ML IJ SOLN
INTRAMUSCULAR | Status: DC | PRN
  Administered 2018-01-27: 5 mg via INTRAVENOUS

## 2018-01-27 MED ORDER — MIDAZOLAM HCL 2 MG/2ML IJ SOLN
INTRAMUSCULAR | Status: DC | PRN
  Administered 2018-01-27: 2 mg via INTRAVENOUS

## 2018-01-27 MED ORDER — GENTAMICIN SULFATE 40 MG/ML IJ SOLN
5.0000 mg/kg | INTRAVENOUS | Status: DC
  Filled 2018-01-27: qty 12.5

## 2018-01-27 MED ORDER — LACTATED RINGERS IV SOLN
INTRAVENOUS | Status: DC
  Administered 2018-01-27: 13:00:00 via INTRAVENOUS
  Filled 2018-01-27: qty 1000

## 2018-01-27 MED ORDER — FENTANYL CITRATE (PF) 100 MCG/2ML IJ SOLN
INTRAMUSCULAR | Status: DC | PRN
  Administered 2018-01-27 (×2): 25 ug via INTRAVENOUS
  Administered 2018-01-27: 50 ug via INTRAVENOUS

## 2018-01-27 MED ORDER — PROPOFOL 10 MG/ML IV BOLUS
INTRAVENOUS | Status: DC | PRN
  Administered 2018-01-27: 200 mg via INTRAVENOUS

## 2018-01-27 MED ORDER — DEXAMETHASONE SODIUM PHOSPHATE 10 MG/ML IJ SOLN
INTRAMUSCULAR | Status: AC
  Filled 2018-01-27: qty 1

## 2018-01-27 MED ORDER — FENTANYL CITRATE (PF) 100 MCG/2ML IJ SOLN
INTRAMUSCULAR | Status: AC
  Filled 2018-01-27: qty 2

## 2018-01-27 MED ORDER — CEPHALEXIN 500 MG PO CAPS: 500.0000 mg | ORAL_CAPSULE | Freq: Two times a day (BID) | ORAL | 0 refills | Status: DC

## 2018-01-27 MED ORDER — FENTANYL CITRATE (PF) 100 MCG/2ML IJ SOLN
25.0000 ug | INTRAMUSCULAR | Status: DC | PRN
  Administered 2018-01-27 (×2): 50 ug via INTRAVENOUS
  Filled 2018-01-27: qty 1

## 2018-01-27 MED ORDER — LIDOCAINE 2% (20 MG/ML) 5 ML SYRINGE
INTRAMUSCULAR | Status: DC | PRN
  Administered 2018-01-27: 60 mg via INTRAVENOUS

## 2018-01-27 MED ORDER — MIDAZOLAM HCL 2 MG/2ML IJ SOLN
INTRAMUSCULAR | Status: AC
  Filled 2018-01-27: qty 2

## 2018-01-27 MED ORDER — IOHEXOL 300 MG/ML  SOLN
INTRAMUSCULAR | Status: DC | PRN
  Administered 2018-01-27: 30 mL

## 2018-01-27 MED ORDER — KETOROLAC TROMETHAMINE 30 MG/ML IJ SOLN
30.0000 mg | Freq: Once | INTRAMUSCULAR | Status: DC | PRN
  Filled 2018-01-27: qty 1

## 2018-01-27 MED ORDER — CEFAZOLIN SODIUM-DEXTROSE 2-3 GM-%(50ML) IV SOLR
INTRAVENOUS | Status: DC | PRN
  Administered 2018-01-27: 2 g via INTRAVENOUS

## 2018-01-27 MED ORDER — KETOROLAC TROMETHAMINE 30 MG/ML IJ SOLN
INTRAMUSCULAR | Status: DC | PRN
  Administered 2018-01-27: 30 mg via INTRAVENOUS

## 2018-01-27 MED ORDER — SODIUM CHLORIDE 0.9 % IR SOLN
Status: DC | PRN
  Administered 2018-01-27: 3000 mL

## 2018-01-27 SURGICAL SUPPLY — 31 items
BAG DRAIN URO-CYSTO SKYTR STRL (DRAIN) ×2 IMPLANT
BAG DRN UROCATH (DRAIN) ×1
BASKET LASER NITINOL 1.9FR (BASKET) ×1 IMPLANT
BSKT STON RTRVL 120 1.9FR (BASKET) ×1
CATH INTERMIT  6FR 70CM (CATHETERS) ×1 IMPLANT
CLOTH BEACON ORANGE TIMEOUT ST (SAFETY) ×2 IMPLANT
DRSG TEGADERM 4X4.75 (GAUZE/BANDAGES/DRESSINGS) ×1 IMPLANT
FIBER LASER FLEXIVA 365 (UROLOGICAL SUPPLIES) IMPLANT
FIBER LASER TRAC TIP (UROLOGICAL SUPPLIES) ×1 IMPLANT
GLOVE BIO SURGEON STRL SZ7 (GLOVE) ×1 IMPLANT
GLOVE BIO SURGEON STRL SZ7.5 (GLOVE) ×2 IMPLANT
GLOVE BIOGEL PI IND STRL 7.0 (GLOVE) IMPLANT
GLOVE BIOGEL PI INDICATOR 7.0 (GLOVE) ×2
GOWN STRL REUS W/TWL LRG LVL3 (GOWN DISPOSABLE) ×2 IMPLANT
GOWN STRL REUS W/TWL XL LVL3 (GOWN DISPOSABLE) ×1 IMPLANT
GUIDEWIRE ANG ZIPWIRE 038X150 (WIRE) ×3 IMPLANT
GUIDEWIRE STR DUAL SENSOR (WIRE) ×3 IMPLANT
INFUSOR MANOMETER BAG 3000ML (MISCELLANEOUS) ×1 IMPLANT
IV NS 1000ML (IV SOLUTION)
IV NS 1000ML BAXH (IV SOLUTION) ×1 IMPLANT
IV NS IRRIG 3000ML ARTHROMATIC (IV SOLUTION) ×2 IMPLANT
KIT TURNOVER CYSTO (KITS) ×2 IMPLANT
MANIFOLD NEPTUNE II (INSTRUMENTS) ×2 IMPLANT
NS IRRIG 500ML POUR BTL (IV SOLUTION) ×3 IMPLANT
PACK CYSTO (CUSTOM PROCEDURE TRAY) ×2 IMPLANT
SHEATH URET ACCESS 12FR/35CM (UROLOGICAL SUPPLIES) ×1 IMPLANT
STENT POLARIS 5FRX26 (STENTS) ×2 IMPLANT
SYR 10ML LL (SYRINGE) ×2 IMPLANT
TUBE CONNECTING 12X1/4 (SUCTIONS) IMPLANT
TUBE FEEDING 8FR 16IN STR KANG (MISCELLANEOUS) ×1 IMPLANT
TUBING UROLOGY SET (TUBING) ×1 IMPLANT

## 2018-01-27 NOTE — Op Note (Signed)
NAME: Devin Sanders, Devin Sanders MEDICAL RECORD IF:[id number] ACCOUNT [id number] DATE OF BIRTH:1980-02-12 FACILITY: [facility] LOCATION: [facility] PHYSICIAN:Tremain Rucinski Berneice Heinrich, MD  OPERATIVE REPORT  DATE OF PROCEDURE:  01/27/2018  PREOPERATIVE DIAGNOSIS:  Cystinuria with recurrent urolithiasis high risk.   POSTOPERATIVE DIAGNOSIS:  Cystinuria with recurrent urolithiasis high risk.  PROCEDURE:   1.  Cystoscopy, bilateral pyelograms, interpretation. 2.  Bilateral ureteroscopy with laser lithotripsy. 3.  Insertion of bilateral ureteral stents 5 x 26 Polaris with tether.  ESTIMATED BLOOD LOSS:  Nil.  COMPLICATIONS:  None.  SPECIMENS:  Bilateral renal stone fragments given to patient.   FINDINGS:  1.  Left greater than right intrarenal stones, nonobstructing, left volume of 8 mm2, right volume 6 mm2. 2.  Complete resolution of all accessible stone fragments larger than one-third mm following laser lithotripsy and basket extraction. 3.  Successful placement of bilateral ureteral stents proximal in the renal pelvis, distal end in urinary bladder.  INDICATIONS:  The patient is a very pleasant but unfortunate 38 year old gentleman with history of cystinuria.  His disease is quite severe.  He is on maximal medical therapy.  He continues to have recurrent urolithiasis often going between crisis to  crisis every several months with an obstructing stone despite maximal medical therapy.  We are trying a new protocol with the patient with preemptive bilateral ureteroscopy approximately every 4 months in an effort to keep him stone free and out of acute  obstruction and overall lessen his need for repeat axial imaging and radiation exposure.  He presents for his second such surgery today.  His first was 4 months ago.  Informed consent was then placed in medical record.  DESCRIPTION OF PROCEDURE:  The patient was identified.  Procedure being bilateral ureteroscopic stimulation was confirmed.  Procedure timeout was  performed.  Intravenous antibiotics administered.  General anesthesia induced.  The patient was placed into  a low lithotomy position, sterile field was created by prepping and draping penis, perineum and proximal thighs using iodine.  Cystourethroscopy was performed using 22-French rigid cystoscope with offset lens.  Inspection of anterior and posterior  urethra was unremarkable.  Inspection of bladder revealed no diverticula or papillary lesions.  There was some small iridescent crystal area consistent with his known cystine disease.  The left ureteral orifice was cannulated with a [place] renal catheter  and left retrograde pyelogram was obtained.  Left retrograde pyelogram demonstrated a single left ureter single system left kidney.  No obvious filling defects or narrowing noted.  A 0.03 ZIPwire was advanced to lower pole and set aside as a safety wire.  Similarly, a right retrograde pyelogram was  obtained.  Right retrograde pyelogram demonstrated a single right ureter with single system right kidney.  No filling defects or narrowing noted.  A separate 0.038 ZIPwire was advanced to lower pole and set aside as a safety wire.  An 8-French feeding tube was  placed in the urinary bladder for pressure release.  Semirigid ureteroscopy was performed of the distal 4/5 of the right ureter alongside a separate sensor working wire.  No mucosal abnormalities were found.  Similarly semirigid ureteroscopy was  performed of the distal 4/5 of the left ureter alongside a separate sensor working wire.  No mucosal abnormalities were found.  Next, a semirigid scope was exchanged for a 12/14, 35 cm ureteral access sheath to the level of the proximal ureter on the  left side.  Using continuous fluoroscopic guidance and flexible digital ureteroscopy performed the left proximal ureter and systematic inspection of the  left kidney, including all calices x3.  There was a single dominant calcification papillary tip   approximately 8 mm in diameter in the upper mid calix.  This was much too large for simple basketing.  Holmium laser energy applied at a 70 setting of 0.2 joules and 20 Hz, and approximately 60% of stone volume was completely ablated using a dusting  technique.  The remaining stone volume was fragmented.  Next, an escape basket was used to remove fragments.  These were set aside to be given to the patient.  Following this, there was complete resolution of all accessible stone fragments larger than  one-third millimeter.  Excellent hemostasis.  No evidence of renal perforation.  The access sheath was removed under continuous vision, no mucosal abnormalities were found.  Next, the access sheath was placed over the right sensor wire to the level of  the proximal ureter using continuous fluoroscopic guidance and flexible digital ureteroscopy performed of the right proximal ureter and kidney, including all calices x3.  There were 2 dominant areas of calcification, one upper mid pole and another lower  mid pole.  Total stone volume approximately 6 mm2.  The lower aspect was not amenable to simple basketing.  Holmium laser energy was then applied at a 70 setting of 0.2 joules and 20 Hz, fragmented into pieces approximately 1-2 mm that then were amenable  to simple basketing.  An escape basket was used to remove all accessible stone fragments larger than one-third millimeter.  Following this, excellent hemostasis.  No evidence of renal perforation.  Complete resolution of all stone fragments on the right  side.  Access sheath was removed under continuous vision, no mucosal abnormalities were found.  Given the bilateral nature of the procedure, it was felt that brief interval stenting would be warranted with tethered stents and a new 5 x 26 Polaris-type  stent was placed over each remaining safety wire using fluoroscopic guidance.  Good proximal and distal plane were noted.  Tether was left in place and fashioned to  the dorsum of the penis.  The procedure terminated.  The patient tolerated the procedure  well.  No immediate perioperative complications.  The patient was taken to postanesthesia care in stable condition with plan for discharge home.  TN/NUANCE  D:01/27/2018 T:01/27/2018 JOB:[id number]

## 2018-01-27 NOTE — H&P (Signed)
Devin Sanders is an 38 y.o. male.    Chief Complaint: Pre-OP BILATERAL Ureteroscopic Stone Manipulation  HPI:    1 - Recurrent Nephrolithiasis -  Pre 2019 - PCNL several each side, [facility].  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 CMP, CBC 02/2017 normal, Cr 0.9.   PMH sig for mild obesity. 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 His PCP is Devin Preyon Moore MD.   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  NO interval fevers. Most recent UA without infectious parameters.    Past Medical History:  Diagnosis Date  . GERD (gastroesophageal reflux disease)   . History of kidney stones   . Renal calculi    bilateral   . Wears glasses     Past Surgical History:  Procedure Laterality Date  . CYSTOSCOPY WITH RETROGRADE PYELOGRAM, URETEROSCOPY AND STENT PLACEMENT Bilateral 06/26/2017   Procedure: CYSTOSCOPY WITH RETROGRADE PYELOGRAM, URETEROSCOPY, STONE BASKETRY  AND STENT PLACEMENT;  Surgeon: Devin Sanders, Devin Attwood, MD;  [facility] [place];  Service: Urology;  Laterality: Bilateral;  . CYSTOSCOPY WITH RETROGRADE PYELOGRAM, URETEROSCOPY AND STENT PLACEMENT Right 09/18/2017   Procedure: CYSTOSCOPY WITH RETROGRADE PYELOGRAM, URETEROSCOPY AND STENT PLACEMENT;  Surgeon: Devin Sanders, Benjamin W, MD;  Location: [facility];  Service: Urology;  Laterality: Right;  . CYSTOSCOPY WITH RETROGRADE PYELOGRAM, URETEROSCOPY AND STENT PLACEMENT Bilateral 10/13/2017   Procedure: CYSTOSCOPY WITH RETROGRADE PYELOGRAM, BILATERAL URETEROSCOPY AND BILATERAL  STENT PLACEMENT, LASER;  Surgeon: Devin Sanders, Navina Wohlers, MD;  Location: [facility];  Service: Urology;  Laterality: Bilateral;  90 MINS  . CYSTOSCOPY/RETROGRADE/URETEROSCOPY/STONE EXTRACTION WITH BASKET Left 10/05/2017   Procedure: CYSTOSCOPY/RETROGRADE/STONE REMOVAL FROM BLADDER ;  Surgeon: Bjorn PippinWrenn, John, MD;  Location: [facility];  Service: Urology;  Laterality: Left;  . CYSTOSCOPY/URETEROSCOPY/HOLMIUM LASER/STENT PLACEMENT Right 09/28/2017   Procedure: CYSTOSCOPY/RIGHT RETROGRADE URETEROSCOPY/HOLMIUM LASER/ RIGHT STENT PLACEMENT;  Surgeon: Devin Sanders, Christopher Aaron, MD;  Location: [facility];  Service: Urology;  Laterality: Right;  . HOLMIUM LASER APPLICATION Bilateral 06/26/2017   Procedure: HOLMIUM LASER APPLICATION;  Surgeon: Devin Sanders, Devin Swingle, MD;  [facility] [place];  Service: Urology;  Laterality: Bilateral;  . HOLMIUM LASER APPLICATION Right 09/18/2017   Procedure: HOLMIUM LASER APPLICATION;  Surgeon: Devin Sanders, Benjamin W, MD;  Location: [facility];  Service: Urology;  Laterality: Right;  . HOLMIUM LASER APPLICATION Bilateral 10/13/2017   Procedure: HOLMIUM LASER APPLICATION;  Surgeon: Devin Sanders, Devin Nobbe, MD;  Location: [facility];  Service: Urology;  Laterality: Bilateral;  . PERCUTANEOUS NEPHROSTOLITHOTOMY  2003;  2009;  [facility]  . URETEROSCOPIC STONE MANIPULATION UNILATERAL  multiple since 1997--  last one 02-19-2017  [facility] by dr Devin Sanders    History reviewed. No pertinent family history. Social History:  reports that he has never smoked. He has never used smokeless tobacco. He reports that he does not drink alcohol or use drugs.  Allergies:  Allergies  Allergen Reactions  . Morphine Nausea And Vomiting  . Other Hives and Swelling    MANGO'S    Medications Prior to Admission  Medication Sig Dispense Refill  . Potassium Citrate 15 MEQ (1620 MG) TBCR Take 2 tablets by mouth 2 (two) times daily.  1  . tiopronin (THIOLA) 100 MG tablet Take 200 mg by mouth 3 (three) times daily.      . cephALEXin (KEFLEX) 500 MG capsule Take 1 capsule (500 mg total) by mouth 2 (two) times daily. X 2 days to prevent infection with tethered stents 4 capsule 0    No results found for this or any previous visit (from the past 48 hour(s)). No results found.  Review of Systems  Constitutional: Negative.  Negative for chills and fever.  HENT: Negative.   Eyes: Negative.   Respiratory: Negative.   Cardiovascular: Negative.   Gastrointestinal: Negative.   Genitourinary: Negative.   Musculoskeletal: Negative.   Skin: Negative.   Neurological: Negative.   Endo/Heme/Allergies: Negative.   Psychiatric/Behavioral: Negative.     Blood pressure (!) 157/92, pulse 93, temperature 97.9 F (36.6 C), temperature source Oral, resp. rate 18, height 5\' 11"  (1.803 m), weight 102.4 kg, SpO2 98 %. Physical Exam  HENT:  Head: Normocephalic.  Eyes: Pupils are equal, round, and reactive to light.  Neck: Normal range of motion.  Cardiovascular: Normal rate.  Respiratory: Effort normal.  GI: Soft.  Genitourinary:    Genitourinary Comments: No CVAT at present   Musculoskeletal: Normal range of motion.  Neurological: He is alert.  Skin: Skin is warm.  Psychiatric: He has a normal mood and affect.     Assessment/Plan  Proceed as planned with BILATERAL ureteroscopic stone disease. Risks, benefits, alternatives, expected peri-op course dicussed previously and reiterated today.   Devin Ache, MD 01/27/2018, 12:39 PM

## 2018-01-27 NOTE — Discharge Instructions (Signed)
1 - You may have urinary urgency (bladder spasms) and bloody urine on / off with stent in place. This is normal.  2 - Remove tethered stents on Friday morning at home by pulling on string, then blue-white plastic tubing and discarding. Office is open Friday if any problems occur.   3- Call MD or go to ER for fever >102, severe pain / nausea / vomiting not relieved by medications, or acute change in medical status  Aleve,Motrin after 9pm   Post Anesthesia Home Care Instructions  Activity: Get plenty of rest for the remainder of the day. A responsible individual must stay with you for 24 hours following the procedure.  For the next 24 hours, DO NOT: -Drive a car -Advertising copywriterperate machinery -Drink alcoholic beverages -Take any medication unless instructed by your physician -Make any legal decisions or sign important papers.  Meals: Start with liquid foods such as gelatin or soup. Progress to regular foods as tolerated. Avoid greasy, spicy, heavy foods. If nausea and/or vomiting occur, drink only clear liquids until the nausea and/or vomiting subsides. Call your physician if vomiting continues.  Special Instructions/Symptoms: Your throat may feel dry or sore from the anesthesia or the breathing tube placed in your throat during surgery. If this causes discomfort, gargle with warm salt water. The discomfort should disappear within 24 hours.  If you had a scopolamine patch placed behind your ear for the management of post- operative nausea and/or vomiting:  1. The medication in the patch is effective for 72 hours, after which it should be removed.  Wrap patch in a tissue and discard in the trash. Wash hands thoroughly with soap and water. 2. You may remove the patch earlier than 72 hours if you experience unpleasant side effects which may include dry mouth, dizziness or visual disturbances. 3. Avoid touching the patch. Wash your hands with soap and water after contact with the patch.

## 2018-01-27 NOTE — Brief Op Note (Signed)
01/27/2018  3:00 PM  PATIENT:  [name]  38 y.o. male  PRE-OPERATIVE DIAGNOSIS:  NEPHROLITHIASIS, CYSTINURIA  POST-OPERATIVE DIAGNOSIS:  NEPHROLITHIASIS, CYSTINURIA  PROCEDURE:  Procedure(s): CYSTOSCOPY/URETEROSCOPY/HOLMIUM LASER/STENT PLACEMENT (Bilateral) HOLMIUM LASER APPLICATION (Bilateral)  SURGEON:  Surgeon(s) and Role:    [name], MD - Primary  PHYSICIAN ASSISTANT:   ASSISTANTS: none   ANESTHESIA:   general  EBL:  minimal   BLOOD ADMINISTERED:none  DRAINS: none   LOCAL MEDICATIONS USED:  NONE  SPECIMEN:  Source of Specimen:  bilateral renal stone fragments  DISPOSITION OF SPECIMEN:  given to patient  COUNTS:  YES  TOURNIQUET:  * No tourniquets in log *  DICTATION: .Other Dictation: Dictation Number  [id number]  PLAN OF CARE: Discharge to home after PACU  PATIENT DISPOSITION:  PACU - hemodynamically stable.   Delay start of Pharmacological VTE agent (>24hrs) due to surgical blood loss or risk of bleeding: yes

## 2018-01-27 NOTE — Transfer of Care (Signed)
Immediate Anesthesia Transfer of Care Note  Patient: Devin Sanders  Procedure(s) Performed: CYSTOSCOPY/URETEROSCOPY/HOLMIUM LASER/STENT PLACEMENT (Bilateral Renal) HOLMIUM LASER APPLICATION (Bilateral Renal)  Patient Location: PACU  Anesthesia Type:General  Level of Consciousness: awake, alert , oriented and patient cooperative  Airway & Oxygen Therapy: Patient Spontanous Breathing and Patient connected to nasal cannula oxygen  Post-op Assessment: Report given to RN and Post -op Vital signs reviewed and stable  Post vital signs: Reviewed and stable  Last Vitals:  Vitals Value Taken Time  BP 146/105 01/27/2018  3:10 PM  Temp    Pulse 91 01/27/2018  3:11 PM  Resp 19 01/27/2018  3:11 PM  SpO2 99 % 01/27/2018  3:11 PM  Vitals shown include unvalidated device data.  Last Pain:  Vitals:   01/27/18 1208  TempSrc: Oral         Complications: No apparent anesthesia complications

## 2018-01-27 NOTE — Anesthesia Procedure Notes (Signed)
Procedure Name: LMA Insertion Date/Time: 01/27/2018 2:11 PM Performed by: Tyrone Nine, CRNA Pre-anesthesia Checklist: Patient identified, Emergency Drugs available, Suction available and Patient being monitored Patient Re-evaluated:Patient Re-evaluated prior to induction Oxygen Delivery Method: Circle system utilized Preoxygenation: Pre-oxygenation with 100% oxygen Induction Type: IV induction Ventilation: Mask ventilation without difficulty LMA: LMA flexible inserted LMA Size: 5.0 Number of attempts: 1 Placement Confirmation: breath sounds checked- equal and bilateral,  CO2 detector and positive ETCO2 Tube secured with: Tape Dental Injury: Teeth and Oropharynx as per pre-operative assessment

## 2018-01-27 NOTE — Anesthesia Postprocedure Evaluation (Signed)
Anesthesia Post Note  Patient: Devin Sanders  Procedure(s) Performed: CYSTOSCOPY/URETEROSCOPY/HOLMIUM LASER/STENT PLACEMENT (Bilateral Renal) HOLMIUM LASER APPLICATION (Bilateral Renal)     Patient location during evaluation: PACU Anesthesia Type: General Level of consciousness: sedated Pain management: pain level controlled Vital Signs Assessment: post-procedure vital signs reviewed and stable Respiratory status: spontaneous breathing and respiratory function stable Cardiovascular status: stable Postop Assessment: no apparent nausea or vomiting Anesthetic complications: no    Last Vitals:  Vitals:   01/27/18 1600 01/27/18 1700  BP: (!) 126/95 (!) 138/97  Pulse: 80 86  Resp: 10 14  Temp:  36.4 C  SpO2: 95% 96%    Last Pain:  Vitals:   01/27/18 1645  TempSrc:   PainSc: 4                  Bhargav Barbaro DANIEL

## 2018-01-27 NOTE — Anesthesia Preprocedure Evaluation (Signed)
Anesthesia Evaluation  Patient identified by MRN, date of birth, ID band Patient awake    Reviewed: Allergy & Precautions, NPO status , Patient's Chart, lab work & pertinent test results  Airway Mallampati: II  TM Distance: >3 FB Neck ROM: Full    Dental no notable dental hx.    Pulmonary neg pulmonary ROS,    Pulmonary exam normal breath sounds clear to auscultation       Cardiovascular negative cardio ROS Normal cardiovascular exam Rhythm:Regular Rate:Normal     Neuro/Psych negative neurological ROS  negative psych ROS   GI/Hepatic Neg liver ROS, GERD  ,  Endo/Other  negative endocrine ROS  Renal/GU negative Renal ROS  negative genitourinary   Musculoskeletal negative musculoskeletal ROS (+)   Abdominal   Peds negative pediatric ROS (+)  Hematology negative hematology ROS (+)   Anesthesia Other Findings   Reproductive/Obstetrics negative OB ROS                             Anesthesia Physical Anesthesia Plan  ASA: II  Anesthesia Plan: General   Post-op Pain Management:    Induction: Intravenous  PONV Risk Score and Plan: 2 and Ondansetron, Dexamethasone and Treatment may vary due to age or medical condition  Airway Management Planned: LMA  Additional Equipment:   Intra-op Plan:   Post-operative Plan: Extubation in OR  Informed Consent: I have reviewed the patients History and Physical, chart, labs and discussed the procedure including the risks, benefits and alternatives for the proposed anesthesia with the patient or authorized representative who has indicated his/her understanding and acceptance.     Dental advisory given  Plan Discussed with: CRNA and Surgeon  Anesthesia Plan Comments:         Anesthesia Quick Evaluation  

## 2018-01-28 ENCOUNTER — Encounter (HOSPITAL_BASED_OUTPATIENT_CLINIC_OR_DEPARTMENT_OTHER): Payer: Self-pay | Admitting: Urology

## 2018-01-29 ENCOUNTER — Encounter (HOSPITAL_COMMUNITY): Payer: Self-pay | Admitting: Emergency Medicine

## 2018-01-29 ENCOUNTER — Other Ambulatory Visit: Payer: Self-pay

## 2018-01-29 ENCOUNTER — Emergency Department (HOSPITAL_COMMUNITY)
Admission: EM | Admit: 2018-01-29 | Discharge: 2018-01-29 | Disposition: A | Discharge status: Home / Self Care | Payer: 59 | Attending: Emergency Medicine | Admitting: Emergency Medicine

## 2018-01-29 DIAGNOSIS — Z79899 Other long term (current) drug therapy: Secondary | ICD-10-CM | POA: Insufficient documentation

## 2018-01-29 DIAGNOSIS — R109 Unspecified abdominal pain: Secondary | ICD-10-CM

## 2018-01-29 DIAGNOSIS — R079 Chest pain, unspecified: Secondary | ICD-10-CM | POA: Diagnosis present

## 2018-01-29 DIAGNOSIS — R1030 Lower abdominal pain, unspecified: Secondary | ICD-10-CM | POA: Diagnosis not present

## 2018-01-29 DIAGNOSIS — R310 Gross hematuria: Secondary | ICD-10-CM | POA: Insufficient documentation

## 2018-01-29 LAB — CBC
HCT: 44 % (ref 39.0–52.0)
Hemoglobin: 14.5 g/dL (ref 13.0–17.0)
MCH: 32.2 pg (ref 26.0–34.0)
MCHC: 33 g/dL (ref 30.0–36.0)
MCV: 97.8 fL (ref 80.0–100.0)
Platelets: 302 10*3/uL (ref 150–400)
RBC: 4.5 MIL/uL (ref 4.22–5.81)
RDW: 11.7 % (ref 11.5–15.5)
WBC: 12.8 10*3/uL — ABNORMAL HIGH (ref 4.0–10.5)
nRBC: 0 % (ref 0.0–0.2)

## 2018-01-29 LAB — BASIC METABOLIC PANEL
Anion gap: 9 (ref 5–15)
BUN: 23 mg/dL — AB (ref 6–20)
CO2: 27 mmol/L (ref 22–32)
Calcium: 9.4 mg/dL (ref 8.9–10.3)
Chloride: 102 mmol/L (ref 98–111)
Creatinine, Ser: 1.21 mg/dL (ref 0.61–1.24)
GFR calc Af Amer: >60 mL/min (ref >60)
GFR calc non Af Amer: >60 mL/min (ref >60)
Glucose, Bld: 135 mg/dL — ABNORMAL HIGH (ref 70–99)
Potassium: 3.8 mmol/L (ref 3.5–5.1)
Sodium: 138 mmol/L (ref 135–145)

## 2018-01-29 LAB — URINALYSIS, ROUTINE W REFLEX MICROSCOPIC
Bilirubin Urine: NEGATIVE
Glucose, UA: 50 mg/dL — AB
Ketones, ur: NEGATIVE mg/dL
Leukocytes, UA: NEGATIVE
Nitrite: NEGATIVE
Protein, ur: >=300 mg/dL — AB
RBC / HPF: >50 RBC/hpf — ABNORMAL HIGH (ref 0–5)
Specific Gravity, Urine: 1.019 (ref 1.005–1.030)
pH: 6 (ref 5.0–8.0)

## 2018-01-29 MED ORDER — ONDANSETRON HCL 4 MG/2ML IJ SOLN
4.0000 mg | Freq: Once | INTRAMUSCULAR | Status: AC
  Administered 2018-01-29: 4 mg via INTRAVENOUS
  Filled 2018-01-29: qty 2

## 2018-01-29 MED ORDER — KETOROLAC TROMETHAMINE 15 MG/ML IJ SOLN
15.0000 mg | Freq: Once | INTRAMUSCULAR | Status: AC
  Administered 2018-01-29: 15 mg via INTRAVENOUS
  Filled 2018-01-29: qty 1

## 2018-01-29 MED ORDER — HYDROMORPHONE HCL 1 MG/ML IJ SOLN
0.5000 mg | Freq: Once | INTRAMUSCULAR | Status: AC
  Administered 2018-01-29: 0.5 mg via INTRAVENOUS
  Filled 2018-01-29: qty 1

## 2018-01-29 NOTE — ED Notes (Signed)
EDPA Provider at bedside. Devin Sanders

## 2018-01-29 NOTE — Discharge Instructions (Signed)
Please read and follow all provided instructions.  Your diagnoses today include:  1. Flank pain   2. Gross hematuria     Tests performed today include:  Blood counts and electrolytes  Urine test - blood in urine  Vital signs. See below for your results today.   Medications prescribed:   None  Take any prescribed medications only as directed.  Home care instructions:  Follow any educational materials contained in this packet.  BE VERY CAREFUL not to take multiple medicines containing Tylenol (also called acetaminophen). Doing so can lead to an overdose which can damage your liver and cause liver failure and possibly death.   Follow-up instructions: Please call your urologist and let them know how you are doing.  Follow-up per their recommendations.  Return instructions:   Please return to the Emergency Department if you experience worsening symptoms.   Please return if you have any other emergent concerns.  Additional Information:  Your vital signs today were: BP (!) 143/101 (BP Location: Left Arm)    Pulse 89    Temp 98 F (36.7 C) (Oral)    Resp 16    Ht 5\' 11"  (1.803 m)    Wt 102.4 kg    SpO2 94%    BMI 31.49 kg/m  If your blood pressure (BP) was elevated above 135/85 this visit, please have this repeated by your doctor within one month. --------------

## 2018-01-29 NOTE — ED Triage Notes (Signed)
Pt is from home report sudden onset of abd pain followed by Nausea and vomiting. Pt reports recent recent cystoscopy for stone removed Wednesday past. N/V begun after pt removed stents from penis as instructed by provider.

## 2018-01-29 NOTE — ED Notes (Signed)
Pt aware that urine sample is needed.  

## 2018-01-29 NOTE — ED Notes (Signed)
Pt. Had 62ml in the bladder while being scan. Nurse aware of pt.

## 2018-01-29 NOTE — ED Provider Notes (Signed)
[facility]-EMERGENCY DEPT Provider Note   CSN: [id number] Arrival date & time: 01/29/18  0539     History   Chief Complaint Chief Complaint  Patient presents with  . Abdominal Pain  . Nausea    HPI YOSUF FOLWELL is a 38 y.o. male.  Patient with history of cystinuria, frequent kidney stones, GERD, no abdominal surgeries --presents to the emergency department today with acute onset of left-sided chest pain with associated nausea and vomiting.  He states he is also having trouble urinating.  Patient states that the pain is the same as when he has kidney stones.  Pain began abruptly after removing ureteral stent this morning.  Patient was instructed to remove the stents.  These were placed after a cystoscopy, lithotripsy and retrieval performed on 1/22 by Dr. Berneice Heinrich.  This is done preemptively to prevent ureteral stones because he gets them so frequently.  Patient denies any fevers, chest pain, shortness of breath.  No treatments prior to arrival. The onset of this condition was acute. The course is constant. Aggravating factors: none. Alleviating factors: none.       Past Medical History:  Diagnosis Date  . GERD (gastroesophageal reflux disease)   . History of kidney stones   . Renal calculi    bilateral   . Wears glasses     Patient Active Problem List   Diagnosis Date Noted  . Hydronephrosis of right kidney 09/28/2017    Past Surgical History:  Procedure Laterality Date  . CYSTOSCOPY WITH RETROGRADE PYELOGRAM, URETEROSCOPY AND STENT PLACEMENT Bilateral 06/26/2017   Procedure: CYSTOSCOPY WITH RETROGRADE PYELOGRAM, URETEROSCOPY, STONE BASKETRY  AND STENT PLACEMENT;  Surgeon: [name], MD;  Location: [facility];  Service: Urology;  Laterality: Bilateral;  . CYSTOSCOPY WITH RETROGRADE PYELOGRAM, URETEROSCOPY AND STENT PLACEMENT Right 09/18/2017   Procedure: CYSTOSCOPY WITH RETROGRADE PYELOGRAM, URETEROSCOPY AND STENT PLACEMENT;   Surgeon: Crist Fat, MD;  Location: [facility];  Service: Urology;  Laterality: Right;  . CYSTOSCOPY WITH RETROGRADE PYELOGRAM, URETEROSCOPY AND STENT PLACEMENT Bilateral 10/13/2017   Procedure: CYSTOSCOPY WITH RETROGRADE PYELOGRAM, BILATERAL URETEROSCOPY AND BILATERAL STENT PLACEMENT, LASER;  Surgeon: [name], MD;  Location: [facility];  Service: Urology;  Laterality: Bilateral;  90 MINS  . CYSTOSCOPY/RETROGRADE/URETEROSCOPY/STONE EXTRACTION WITH BASKET Left 10/05/2017   Procedure: CYSTOSCOPY/RETROGRADE/STONE REMOVAL FROM BLADDER ;  Surgeon: Bjorn Pippin, MD;  Location: [facility];  Service: Urology;  Laterality: Left;  . CYSTOSCOPY/URETEROSCOPY/HOLMIUM LASER/STENT PLACEMENT Right 09/28/2017   Procedure: CYSTOSCOPY/RIGHT RETROGRADE URETEROSCOPY/HOLMIUM LASER/ RIGHT STENT PLACEMENT;  Surgeon: Rene Paci, MD;  Location: [facility];  Service: Urology;  Laterality: Right;  . CYSTOSCOPY/URETEROSCOPY/HOLMIUM LASER/STENT PLACEMENT Bilateral 01/27/2018   Procedure: CYSTOSCOPY/URETEROSCOPY/HOLMIUM LASER/STENT PLACEMENT;  Surgeon: [name], MD;  Location: [facility];  Service: Urology;  Laterality: Bilateral;  . HOLMIUM LASER APPLICATION Bilateral 06/26/2017   Procedure: HOLMIUM LASER APPLICATION;  Surgeon: [name], MD;  Location: [facility];  Service: Urology;  Laterality: Bilateral;  . HOLMIUM LASER APPLICATION Right 09/18/2017   Procedure: HOLMIUM LASER APPLICATION;  Surgeon: Crist Fat, MD;  Location: [facility];  Service: Urology;  Laterality: Right;  . HOLMIUM LASER APPLICATION Bilateral 10/13/2017   Procedure: HOLMIUM LASER APPLICATION;  Surgeon: [name], MD;  Location: [facility];  Service: Urology;  Laterality: Bilateral;  . HOLMIUM LASER APPLICATION Bilateral 01/27/2018   Procedure: HOLMIUM LASER APPLICATION;  Surgeon: [name], MD;  Location: [facility];  Service: Urology;  Laterality: Bilateral;  . PERCUTANEOUS  NEPHROSTOLITHOTOMY  2003;  2009;  [facility]  . URETEROSCOPIC STONE MANIPULATION UNILATERAL  multiple since 1997--  last one 02-19-2017  [facility] [email or web address]        Home Medications    Prior to Admission medications   Medication Sig Start Date End Date Taking? Authorizing Provider  cephALEXin (KEFLEX) 500 MG capsule Take 1 capsule (500 mg total) by mouth 2 (two) times daily. X 2 days to prevent infection with tethered stents 01/27/18   Sebastian AcheManny, Theodore, MD  oxyCODONE-acetaminophen (PERCOCET) 5-325 MG tablet Take 1-2 tablets by mouth every 6 (six) hours as needed for severe pain. Post-operatively 01/27/18 01/27/19  Sebastian AcheManny, Theodore, MD  Potassium Citrate 15 MEQ (1620 MG) TBCR Take 2 tablets by mouth 2 (two) times daily. 07/21/17   [provider]  tiopronin (THIOLA) 100 MG tablet Take 200 mg by mouth 3 (three) times daily.  07/23/16   [provider]    Family History History reviewed. No pertinent family history.  Social History Social History   Tobacco Use  . Smoking status: Never Smoker  . Smokeless tobacco: Never Used  Substance Use Topics  . Alcohol use: Never    Frequency: Never  . Drug use: Never     Allergies   Morphine and Other   Review of Systems Review of Systems  Constitutional: Negative for fever.  HENT: Negative for rhinorrhea and sore throat.   Eyes: Negative for redness.  Respiratory: Negative for cough and shortness of breath.   Cardiovascular: Negative for chest pain.  Gastrointestinal: Positive for abdominal pain, nausea and vomiting. Negative for diarrhea.  Genitourinary: Positive for difficulty urinating. Negative for dysuria.  Musculoskeletal: Negative for myalgias.  Skin: Negative for rash.  Neurological: Negative for headaches.     Physical Exam Updated Vital Signs BP (!) 159/101 (BP Location: Left Arm)   Pulse 79   Temp 97.8 F (36.6 C) (Oral)   Resp 18   SpO2 98%   Physical Exam Vitals signs and  nursing note reviewed.  Constitutional:      Appearance: He is well-developed.  HENT:     Head: Normocephalic and atraumatic.  Eyes:     General:        Right eye: No discharge.        Left eye: No discharge.     Conjunctiva/sclera: Conjunctivae normal.  Neck:     Musculoskeletal: Normal range of motion and neck supple.  Cardiovascular:     Rate and Rhythm: Normal rate and regular rhythm.     Heart sounds: Normal heart sounds.  Pulmonary:     Effort: Pulmonary effort is normal.     Breath sounds: Normal breath sounds.  Abdominal:     Palpations: Abdomen is soft.     Tenderness: There is abdominal tenderness in the left lower quadrant. There is no guarding or rebound.  Skin:    General: Skin is warm and dry.  Neurological:     Mental Status: He is alert.      ED Treatments / Results  Labs (all labs ordered are listed, but only abnormal results are displayed) Labs Reviewed  CBC - Abnormal; Notable for the following components:      Result Value   WBC 12.8 (*)    All other components within normal limits  BASIC METABOLIC PANEL - Abnormal; Notable for the following components:   Glucose, Bld 135 (*)    BUN 23 (*)    All other components within normal  limits  URINALYSIS, ROUTINE W REFLEX MICROSCOPIC    EKG None  Radiology No results found.  Procedures Procedures (including critical care time)  Medications Ordered in ED Medications  HYDROmorphone (DILAUDID) injection 0.5 mg (has no administration in time range)  ondansetron (ZOFRAN) injection 4 mg (has no administration in time range)  ketorolac (TORADOL) 15 MG/ML injection 15 mg (has no administration in time range)     Initial Impression / Assessment and Plan / ED Course  I have reviewed the triage vital signs and the nursing notes.  Pertinent labs & imaging results that were available during my care of the patient were reviewed by me and considered in my medical decision making (see chart for details).      Patient seen and examined.  Reviewed recent urology records.  Work-up initiated. Medications ordered.   Vital signs reviewed and are as follows: BP (!) 159/101 (BP Location: Left Arm)   Pulse 79   Temp 97.8 F (36.6 C) (Oral)   Resp 18   SpO2 98%   7:59 AM Pt rechecked, appears comfortable. States pain is improved, not gone. Awaiting UA. Labs overall reassuring.   9:47 AM Patient rechecked. Urine is at bedside, appears dark. Additional pain medicine ordered.   10:49 AM patient reevaluated.  Updated on urine results.  Patient is feeling better and is ready for discharged home at this time.  I encouraged the patient to call his urologist office and let them know how he is doing.  Follow-up further recommendations.  Encouraged return with any worsening or changing symptoms.  Patient verbalizes understanding agrees with plan.  Final Clinical Impressions(s) / ED Diagnoses   Final diagnoses:  Flank pain  Gross hematuria   Patient with flank pain and vomiting after removal of urinary stent.  Lab work is reassuring.  Large amount of blood in urine.  Doubt infection.  Suspect pain was due to ureteral irritation after stent removal.  Cannot rule out stone entirely.  Patient has appropriate follow-up.  He appears well and is requesting discharge.  ED Discharge Orders    None       Renne Crigler, PA-C 01/29/18 1051    Virgina Norfolk, DO 01/29/18 1436

## 2018-02-06 DIAGNOSIS — U071 COVID-19: Secondary | ICD-10-CM

## 2018-02-06 HISTORY — DX: COVID-19: U07.1

## 2018-03-23 ENCOUNTER — Other Ambulatory Visit: Payer: Self-pay | Admitting: Urology

## 2018-03-23 ENCOUNTER — Other Ambulatory Visit: Payer: Self-pay

## 2018-03-23 ENCOUNTER — Encounter (HOSPITAL_COMMUNITY): Payer: Self-pay | Admitting: *Deleted

## 2018-03-24 ENCOUNTER — Ambulatory Visit (HOSPITAL_COMMUNITY): Payer: 59

## 2018-03-24 ENCOUNTER — Other Ambulatory Visit: Payer: Self-pay

## 2018-03-24 ENCOUNTER — Encounter (HOSPITAL_COMMUNITY)
Admission: RE | Disposition: A | Discharge status: Home / Self Care | Payer: Self-pay | Source: Home / Self Care | Attending: Urology

## 2018-03-24 ENCOUNTER — Ambulatory Visit (HOSPITAL_COMMUNITY)
Admission: RE | Admit: 2018-03-24 | Discharge: 2018-03-24 | Disposition: A | Discharge status: Home / Self Care | Payer: 59 | Attending: Urology | Admitting: Urology

## 2018-03-24 ENCOUNTER — Ambulatory Visit (HOSPITAL_COMMUNITY): Payer: 59 | Admitting: Certified Registered Nurse Anesthetist

## 2018-03-24 ENCOUNTER — Encounter (HOSPITAL_COMMUNITY): Payer: Self-pay | Admitting: Emergency Medicine

## 2018-03-24 DIAGNOSIS — Z79899 Other long term (current) drug therapy: Secondary | ICD-10-CM | POA: Insufficient documentation

## 2018-03-24 DIAGNOSIS — E669 Obesity, unspecified: Secondary | ICD-10-CM | POA: Diagnosis not present

## 2018-03-24 DIAGNOSIS — Z6831 Body mass index (BMI) 31.0-31.9, adult: Secondary | ICD-10-CM | POA: Insufficient documentation

## 2018-03-24 DIAGNOSIS — E7201 Cystinuria: Secondary | ICD-10-CM | POA: Insufficient documentation

## 2018-03-24 DIAGNOSIS — N200 Calculus of kidney: Secondary | ICD-10-CM | POA: Diagnosis not present

## 2018-03-24 DIAGNOSIS — Z87442 Personal history of urinary calculi: Secondary | ICD-10-CM | POA: Insufficient documentation

## 2018-03-24 DIAGNOSIS — N201 Calculus of ureter: Secondary | ICD-10-CM | POA: Diagnosis present

## 2018-03-24 HISTORY — PX: CYSTOSCOPY WITH RETROGRADE PYELOGRAM, URETEROSCOPY AND STENT PLACEMENT: SHX5789

## 2018-03-24 HISTORY — PX: HOLMIUM LASER APPLICATION: SHX5852

## 2018-03-24 LAB — BASIC METABOLIC PANEL
ANION GAP: 9 (ref 5–15)
BUN: 17 mg/dL (ref 6–20)
CO2: 22 mmol/L (ref 22–32)
Calcium: 9.3 mg/dL (ref 8.9–10.3)
Chloride: 107 mmol/L (ref 98–111)
Creatinine, Ser: 0.81 mg/dL (ref 0.61–1.24)
GFR calc Af Amer: >60 mL/min (ref >60)
GFR calc non Af Amer: >60 mL/min (ref >60)
Glucose, Bld: 86 mg/dL (ref 70–99)
Potassium: 3.9 mmol/L (ref 3.5–5.1)
Sodium: 138 mmol/L (ref 135–145)

## 2018-03-24 LAB — CBC
HCT: 41.3 % (ref 39.0–52.0)
Hemoglobin: 14 g/dL (ref 13.0–17.0)
MCH: 33.8 pg (ref 26.0–34.0)
MCHC: 33.9 g/dL (ref 30.0–36.0)
MCV: 99.8 fL (ref 80.0–100.0)
NRBC: 0 % (ref 0.0–0.2)
Platelets: 51 10*3/uL — ABNORMAL LOW (ref 150–400)
RBC: 4.14 MIL/uL — ABNORMAL LOW (ref 4.22–5.81)
RDW: 11.7 % (ref 11.5–15.5)
WBC: 4.6 10*3/uL (ref 4.0–10.5)

## 2018-03-24 SURGERY — CYSTOURETEROSCOPY, WITH RETROGRADE PYELOGRAM AND STENT INSERTION
Anesthesia: General | Laterality: Bilateral

## 2018-03-24 MED ORDER — FENTANYL CITRATE (PF) 100 MCG/2ML IJ SOLN
INTRAMUSCULAR | Status: DC | PRN
  Administered 2018-03-24 (×2): 50 ug via INTRAVENOUS

## 2018-03-24 MED ORDER — LACTATED RINGERS IV SOLN
INTRAVENOUS | Status: DC
  Administered 2018-03-24: 15:00:00 via INTRAVENOUS

## 2018-03-24 MED ORDER — KETOROLAC TROMETHAMINE 30 MG/ML IJ SOLN
INTRAMUSCULAR | Status: AC
  Administered 2018-03-24: 30 mg via INTRAVENOUS
  Filled 2018-03-24: qty 1

## 2018-03-24 MED ORDER — MIDAZOLAM HCL 5 MG/5ML IJ SOLN
INTRAMUSCULAR | Status: DC | PRN
  Administered 2018-03-24 (×2): 1 mg via INTRAVENOUS

## 2018-03-24 MED ORDER — LIDOCAINE HCL (CARDIAC) PF 100 MG/5ML IV SOSY
PREFILLED_SYRINGE | INTRAVENOUS | Status: DC | PRN
  Administered 2018-03-24: 100 mg via INTRAVENOUS

## 2018-03-24 MED ORDER — IOHEXOL 300 MG/ML  SOLN
INTRAMUSCULAR | Status: DC | PRN
  Administered 2018-03-24: 27 mL via URETHRAL

## 2018-03-24 MED ORDER — MIDAZOLAM HCL 2 MG/2ML IJ SOLN
INTRAMUSCULAR | Status: AC
  Filled 2018-03-24: qty 2

## 2018-03-24 MED ORDER — PROPOFOL 10 MG/ML IV BOLUS
INTRAVENOUS | Status: DC | PRN
  Administered 2018-03-24: 200 mg via INTRAVENOUS

## 2018-03-24 MED ORDER — PROPOFOL 10 MG/ML IV BOLUS
INTRAVENOUS | Status: AC
  Filled 2018-03-24: qty 20

## 2018-03-24 MED ORDER — PROMETHAZINE HCL 25 MG/ML IJ SOLN: 6.2500 mg | INTRAMUSCULAR | Status: DC | PRN

## 2018-03-24 MED ORDER — LACTATED RINGERS IV SOLN
INTRAVENOUS | Status: DC | PRN
  Administered 2018-03-24: 16:00:00 via INTRAVENOUS

## 2018-03-24 MED ORDER — GENTAMICIN SULFATE 40 MG/ML IJ SOLN
500.0000 mg | INTRAVENOUS | Status: AC
  Administered 2018-03-24: 500 mg via INTRAVENOUS
  Filled 2018-03-24: qty 12.5

## 2018-03-24 MED ORDER — OXYCODONE HCL 5 MG/5ML PO SOLN: 5.0000 mg | Freq: Once | ORAL | Status: DC | PRN

## 2018-03-24 MED ORDER — ONDANSETRON HCL 4 MG/2ML IJ SOLN
INTRAMUSCULAR | Status: DC | PRN
  Administered 2018-03-24: 4 mg via INTRAVENOUS

## 2018-03-24 MED ORDER — FENTANYL CITRATE (PF) 100 MCG/2ML IJ SOLN
INTRAMUSCULAR | Status: AC
  Filled 2018-03-24: qty 2

## 2018-03-24 MED ORDER — HYDROMORPHONE HCL 1 MG/ML IJ SOLN
INTRAMUSCULAR | Status: AC
  Filled 2018-03-24: qty 1

## 2018-03-24 MED ORDER — HYDROMORPHONE HCL 1 MG/ML IJ SOLN
0.2500 mg | INTRAMUSCULAR | Status: DC | PRN
  Administered 2018-03-24 (×3): 0.5 mg via INTRAVENOUS

## 2018-03-24 MED ORDER — DEXAMETHASONE SODIUM PHOSPHATE 10 MG/ML IJ SOLN
INTRAMUSCULAR | Status: DC | PRN
  Administered 2018-03-24: 10 mg via INTRAVENOUS

## 2018-03-24 MED ORDER — SODIUM CHLORIDE 0.9 % IR SOLN
Status: DC | PRN
  Administered 2018-03-24: 3000 mL via INTRAVESICAL

## 2018-03-24 MED ORDER — DIPHENHYDRAMINE HCL 50 MG/ML IJ SOLN
12.5000 mg | Freq: Once | INTRAMUSCULAR | Status: AC
  Administered 2018-03-24: 12.5 mg via INTRAVENOUS

## 2018-03-24 MED ORDER — DIPHENHYDRAMINE HCL 50 MG/ML IJ SOLN
INTRAMUSCULAR | Status: AC
  Filled 2018-03-24: qty 1

## 2018-03-24 MED ORDER — KETOROLAC TROMETHAMINE 30 MG/ML IJ SOLN
30.0000 mg | Freq: Once | INTRAMUSCULAR | Status: AC
  Administered 2018-03-24: 30 mg via INTRAVENOUS

## 2018-03-24 MED ORDER — HYDROMORPHONE HCL 1 MG/ML IJ SOLN
INTRAMUSCULAR | Status: AC
  Administered 2018-03-24: 0.5 mg via INTRAVENOUS
  Filled 2018-03-24: qty 1

## 2018-03-24 MED ORDER — OXYCODONE HCL 5 MG PO TABS: 5.0000 mg | ORAL_TABLET | Freq: Once | ORAL | Status: DC | PRN

## 2018-03-24 MED ORDER — CEPHALEXIN 500 MG PO CAPS: 500.0000 mg | ORAL_CAPSULE | Freq: Two times a day (BID) | ORAL | 0 refills | Status: DC

## 2018-03-24 MED ORDER — OXYCODONE-ACETAMINOPHEN 5-325 MG PO TABS: 1.0000 | ORAL_TABLET | Freq: Four times a day (QID) | ORAL | 0 refills | Status: DC | PRN

## 2018-03-24 SURGICAL SUPPLY — 27 items
BAG URO CATCHER STRL LF (MISCELLANEOUS) ×2 IMPLANT
BASKET LASER NITINOL 1.9FR (BASKET) ×1 IMPLANT
BSKT STON RTRVL 120 1.9FR (BASKET) ×1
CATH INTERMIT  6FR 70CM (CATHETERS) ×2 IMPLANT
CLOTH BEACON ORANGE TIMEOUT ST (SAFETY) ×2 IMPLANT
COVER SURGICAL LIGHT HANDLE (MISCELLANEOUS) ×1 IMPLANT
COVER WAND RF STERILE (DRAPES) IMPLANT
EXTRACTOR STONE 1.7FRX115CM (UROLOGICAL SUPPLIES) IMPLANT
FIBER LASER FLEXIVA 1000 (UROLOGICAL SUPPLIES) IMPLANT
FIBER LASER FLEXIVA 365 (UROLOGICAL SUPPLIES) IMPLANT
FIBER LASER FLEXIVA 550 (UROLOGICAL SUPPLIES) IMPLANT
FIBER LASER TRAC TIP (UROLOGICAL SUPPLIES) ×1 IMPLANT
GLOVE BIOGEL M STRL SZ7.5 (GLOVE) ×6 IMPLANT
GOWN STRL REUS W/TWL LRG LVL3 (GOWN DISPOSABLE) ×5 IMPLANT
GUIDEWIRE ANG ZIPWIRE 038X150 (WIRE) ×3 IMPLANT
GUIDEWIRE STR DUAL SENSOR (WIRE) ×3 IMPLANT
IV NS 1000ML (IV SOLUTION)
IV NS 1000ML BAXH (IV SOLUTION) ×1 IMPLANT
KIT TURNOVER KIT A (KITS) ×1 IMPLANT
MANIFOLD NEPTUNE II (INSTRUMENTS) ×2 IMPLANT
PACK CYSTO (CUSTOM PROCEDURE TRAY) ×2 IMPLANT
SHEATH URETERAL 12FRX28CM (UROLOGICAL SUPPLIES) IMPLANT
SHEATH URETERAL 12FRX35CM (MISCELLANEOUS) ×1 IMPLANT
STENT POLARIS 5FRX26 (STENTS) ×2 IMPLANT
SYR CONTROL 10ML LL (SYRINGE) ×2 IMPLANT
TUBE FEEDING 8FR 16IN STR KANG (MISCELLANEOUS) ×2 IMPLANT
TUBING CONNECTING 10 (TUBING) ×2 IMPLANT

## 2018-03-24 NOTE — Anesthesia Postprocedure Evaluation (Signed)
Anesthesia Post Note  Patient: Devin Sanders  Procedure(s) Performed: CYSTOSCOPY WITH RETROGRADE PYELOGRAM, URETEROSCOPY AND STENT PLACEMENT (Bilateral ) HOLMIUM LASER APPLICATION (Bilateral )     Patient location during evaluation: PACU Anesthesia Type: General Level of consciousness: awake and alert Pain management: pain level controlled Vital Signs Assessment: post-procedure vital signs reviewed and stable Respiratory status: spontaneous breathing, nonlabored ventilation and respiratory function stable Cardiovascular status: blood pressure returned to baseline and stable Postop Assessment: no apparent nausea or vomiting Anesthetic complications: no    Last Vitals:  Vitals:   03/24/18 1652 03/24/18 1715  BP: (!) 136/102 (!) 129/95  Pulse: 80 79  Resp: 12 12  Temp:    SpO2: 100% 100%    Last Pain:  Vitals:   03/24/18 1715  TempSrc:   PainSc: 6                  Lowella Curb

## 2018-03-24 NOTE — Transfer of Care (Signed)
Immediate Anesthesia Transfer of Care Note  Patient: Devin Sanders  Procedure(s) Performed: CYSTOSCOPY WITH RETROGRADE PYELOGRAM, URETEROSCOPY AND STENT PLACEMENT (Bilateral ) HOLMIUM LASER APPLICATION (Bilateral )  Patient Location: PACU  Anesthesia Type:General  Level of Consciousness: awake, oriented, drowsy and patient cooperative  Airway & Oxygen Therapy: Patient Spontanous Breathing and Patient connected to face mask oxygen  Post-op Assessment: Report given to RN, Post -op Vital signs reviewed and stable and Patient moving all extremities  Post vital signs: Reviewed and stable  Last Vitals:  Vitals Value Taken Time  BP 136/102 03/24/2018  4:52 PM  Temp    Pulse 73 03/24/2018  4:55 PM  Resp 14 03/24/2018  4:55 PM  SpO2 100 % 03/24/2018  4:55 PM  Vitals shown include unvalidated device data.  Last Pain:  Vitals:   03/24/18 1421  TempSrc:   PainSc: 8       Patients Stated Pain Goal: 5 (03/24/18 1421)  Complications: No apparent anesthesia complications

## 2018-03-24 NOTE — Brief Op Note (Signed)
03/24/2018  4:40 PM  PATIENT:  [name]  38 y.o. male  PRE-OPERATIVE DIAGNOSIS:  LEFT URETERAL AND BILATERAL RENAL STONES  POST-OPERATIVE DIAGNOSIS:  bilateral ureteral stones  PROCEDURE:  Procedure(s) with comments: CYSTOSCOPY WITH RETROGRADE PYELOGRAM, URETEROSCOPY AND STENT PLACEMENT (Bilateral) - 1 HR HOLMIUM LASER APPLICATION (Bilateral)  SURGEON:  Surgeon(s) and Role:    [name], MD - Primary  PHYSICIAN ASSISTANT:   ASSISTANTS: none   ANESTHESIA:   general  EBL:  minimal   BLOOD ADMINISTERED:none  DRAINS: none   LOCAL MEDICATIONS USED:  NONE  SPECIMEN:  Source of Specimen:  bilateral renal stone fragments  DISPOSITION OF SPECIMEN:  given to patient  COUNTS:  YES  TOURNIQUET:  * No tourniquets in log *  DICTATION: .Other Dictation: Dictation Number  [id number]  PLAN OF CARE: Discharge to home after PACU  PATIENT DISPOSITION:  PACU - hemodynamically stable.   Delay start of Pharmacological VTE agent (>24hrs) due to surgical blood loss or risk of bleeding: yes

## 2018-03-24 NOTE — Progress Notes (Signed)
Dr Berneice Heinrich at bedside. Reports pt does NOT need to void prior to discharge.

## 2018-03-24 NOTE — Anesthesia Preprocedure Evaluation (Signed)
Anesthesia Evaluation  Patient identified by MRN, date of birth, ID band Patient awake    Reviewed: Allergy & Precautions, NPO status , Patient's Chart, lab work & pertinent test results  Airway Mallampati: II  TM Distance: >3 FB Neck ROM: Full    Dental no notable dental hx.    Pulmonary neg pulmonary ROS,    Pulmonary exam normal breath sounds clear to auscultation       Cardiovascular negative cardio ROS Normal cardiovascular exam Rhythm:Regular Rate:Normal     Neuro/Psych negative neurological ROS  negative psych ROS   GI/Hepatic Neg liver ROS, GERD  ,  Endo/Other  negative endocrine ROS  Renal/GU negative Renal ROS  negative genitourinary   Musculoskeletal negative musculoskeletal ROS (+)   Abdominal   Peds negative pediatric ROS (+)  Hematology negative hematology ROS (+)   Anesthesia Other Findings   Reproductive/Obstetrics negative OB ROS                             Anesthesia Physical  Anesthesia Plan  ASA: II  Anesthesia Plan: General   Post-op Pain Management:    Induction: Intravenous  PONV Risk Score and Plan: 2 and Ondansetron, Treatment may vary due to age or medical condition and Midazolam  Airway Management Planned: LMA  Additional Equipment:   Intra-op Plan:   Post-operative Plan: Extubation in OR  Informed Consent: I have reviewed the patients History and Physical, chart, labs and discussed the procedure including the risks, benefits and alternatives for the proposed anesthesia with the patient or authorized representative who has indicated his/her understanding and acceptance.     Dental advisory given  Plan Discussed with: CRNA and Surgeon  Anesthesia Plan Comments:         Anesthesia Quick Evaluation  

## 2018-03-24 NOTE — Discharge Instructions (Signed)
1 - You may have urinary urgency (bladder spasms) and bloody urine on / off with stent in place. This is normal.  2 - Remove tethered stents on Friday morning at home.   3 - Call MD or go to ER for fever >102, severe pain / nausea / vomiting not relieved by medications, or acute change in medical status   General Anesthesia, Adult, Care After This sheet gives you information about how to care for yourself after your procedure. Your health care provider may also give you more specific instructions. If you have problems or questions, contact your health care provider. What can I expect after the procedure? After the procedure, the following side effects are common:  Pain or discomfort at the IV site.  Nausea.  Vomiting.  Sore throat.  Trouble concentrating.  Feeling cold or chills.  Weak or tired.  Sleepiness and fatigue.  Soreness and body aches. These side effects can affect parts of the body that were not involved in surgery. Follow these instructions at home:  For at least 24 hours after the procedure:  Have a responsible adult stay with you. It is important to have someone help care for you until you are awake and alert.  Rest as needed.  Do not: ? Participate in activities in which you could fall or become injured. ? Drive. ? Use heavy machinery. ? Drink alcohol. ? Take sleeping pills or medicines that cause drowsiness. ? Make important decisions or sign legal documents. ? Take care of children on your own. Eating and drinking  Follow any instructions from your health care provider about eating or drinking restrictions.  When you feel hungry, start by eating small amounts of foods that are soft and easy to digest (bland), such as toast. Gradually return to your regular diet.  Drink enough fluid to keep your urine pale yellow.  If you vomit, rehydrate by drinking water, juice, or clear broth. General instructions  If you have sleep apnea, surgery and certain  medicines can increase your risk for breathing problems. Follow instructions from your health care provider about wearing your sleep device: ? Anytime you are sleeping, including during daytime naps. ? While taking prescription pain medicines, sleeping medicines, or medicines that make you drowsy.  Return to your normal activities as told by your health care provider. Ask your health care provider what activities are safe for you.  Take over-the-counter and prescription medicines only as told by your health care provider.  If you smoke, do not smoke without supervision.  Keep all follow-up visits as told by your health care provider. This is important. Contact a health care provider if:  You have nausea or vomiting that does not get better with medicine.  You cannot eat or drink without vomiting.  You have pain that does not get better with medicine.  You are unable to pass urine.  You develop a skin rash.  You have a fever.  You have redness around your IV site that gets worse. Get help right away if:  You have difficulty breathing.  You have chest pain.  You have blood in your urine or stool, or you vomit blood. Summary  After the procedure, it is common to have a sore throat or nausea. It is also common to feel tired.  Have a responsible adult stay with you for the first 24 hours after general anesthesia. It is important to have someone help care for you until you are awake and alert.  When you  feel hungry, start by eating small amounts of foods that are soft and easy to digest (bland), such as toast. Gradually return to your regular diet.  Drink enough fluid to keep your urine pale yellow.  Return to your normal activities as told by your health care provider. Ask your health care provider what activities are safe for you. This information is not intended to replace advice given to you by your health care provider. Make sure you discuss any questions you have with your  health care provider. Document Released: 03/31/2000 Document Revised: 08/08/2016 Document Reviewed: 08/08/2016 Elsevier Interactive Patient Education  2019 [employer].

## 2018-03-24 NOTE — Anesthesia Procedure Notes (Signed)
Procedure Name: LMA Insertion Date/Time: 03/24/2018 3:57 PM Performed by: Lowella Curb, MD Pre-anesthesia Checklist: Patient identified, Emergency Drugs available, Suction available, Patient being monitored and Timeout performed Patient Re-evaluated:Patient Re-evaluated prior to induction Oxygen Delivery Method: Circle system utilized Preoxygenation: Pre-oxygenation with 100% oxygen Induction Type: IV induction LMA: LMA with gastric port inserted LMA Size: 4.0 Number of attempts: 1 Placement Confirmation: positive ETCO2 and breath sounds checked- equal and bilateral Tube secured with: Tape Dental Injury: Teeth and Oropharynx as per pre-operative assessment

## 2018-03-25 ENCOUNTER — Encounter (HOSPITAL_COMMUNITY): Payer: Self-pay | Admitting: Urology

## 2018-03-25 NOTE — Op Note (Signed)
NAME: ZISHA, MONTJOY MEDICAL RECORD [id number] ACCOUNT [id number] DATE OF BIRTH:11/21/1980 FACILITY: [facility] LOCATION: [facility] PH[facility]ICIAN:Amera Banos Berneice Heinrich, MD  OPERATIVE REPORT  DATE OF PROCEDURE:  03/24/2018  PREOPERATIVE DIAGNOSES:   1.  Left ureteral stone.   2.  History of cystinuria.  POSTOPERATIVE DIAGNOSIS: 1.   Right greater than left intrarenal stones.   2.  Cystinuria.  PROCEDURE: 1.  Cystoscopy, bilateral pyelograms, interpretation. 2.  Bilateral ureteroscopy with laser lithotripsy. 3.  Insertion of bilateral ureteral stents,  5 x 26 Polaris with tether.  ESTIMATED BLOOD LOSS:  Nil.  SPECIMENS:  Right greater than left, small volume intrarenal stones  given to patient.  FINDINGS: 1.  No evidence of ureteral stones bilaterally. 2.  Some mucosal edema at the left ureteral orifice in crystal area consistent with likely interval stone passage. 3.  Right greater than left intrarenal stones,  right approximately 9 mm2.  Left approximately 2 mm2. 4.  Complete resolution of all accessible stone fragments larger than one-third millimeter following laser lithotripsy and basket extraction. 5.  Successful placement of bilateral ureteral stents proximal in the renal pelvis, distal end urinary bladder.  INDICATIONS:  The patient is a very pleasant 38 year old man with history of severe cystinuria.  He has had innumerable episodes of renal colic in the past.  He is now on a protocol of preemptive bilateral ureteroscopy, approximately every 4 months in an  effort to avoid excessive axial imaging and emergency room visit and has done quite well with this.  He did, however, develop left ureteral colic.  Over the past several days, it has been worsening.  His symptoms have been very reliable with correlation  of stones.  Options were discussed including imaging versus medical therapy versus proceeding with bilateral ureteroscopy, and proceed with the latter with goal of stone free.   Informed consent was then placed in medical record.  DESCRIPTION OF PROCEDURE:  The patient being Devin Sanders verified, procedure being bilateral ureteroscopic stimulation was confirmed.  Procedure timeout was performed.  Antibiotics administered.  General LMA anesthesia induced.  The patient was  placed into a low lithotomy position, sterile field was created prepped and draped his penis, perineum and proximal thighs using iodine.  Cystourethroscopy was performed using a 22-French rigid cystoscope with offset lens.  Inspection of anterior and  posterior urethra were unremarkable.  Inspection of bladder revealed no diverticula, calcifications or papillary lesions.  The left ureteral orifice had a small amount of mucosal edema with it as well as some crystal area in the area of the left ureteral  orifice consistent with possible intraluminal stone versus recent stone passage.  The left ureteral orifice was then cannulated with a 6-French end-hole catheter and left retrograde pyelogram was obtained.  Left ventriculogram reveals a single left ureter and single system left kidney.  No obvious filling defects, narrowing or hydronephrosis noted.  A 0.038 ZIPwire was advanced into the lower pole and set aside as a safety wire.  Similarly, right retrograde  pyelogram was obtained.  Right retrograde pyelogram demonstrated a single right ureter, single system right kidney.  No filling defects or narrowing noted.  A separate 0.038 ZIPwire was advanced to lower pole and set aside as a safety wire.  An 8-French feeding tube was placed  in the urinary bladder with pressure released.  A semi-rigid ureteroscopy was performed of the distal orifice of the right ureter alongside a separate sensor working wire.  No mucosal abnormalities were found.  Similarly, a semirigid ureteroscopy was  performed of the distal orifice of the left ureter alongside a separate sensor working wire.  No mucosal abnormalities were found.   Exquisite care was taken with visualization of the distal left ureter, where I was somewhat suspicious that there would be  an intraluminal stone; however, there was not whatsoever.  This was felt to likely represent interval stone passage As the goal today was to verify bilateral stone free status, the semirigid scope was exchanged for a 12/14, 35 cm ureteral access sheath  to the level of the proximal ureter.  Using continuous fluoroscopic guidance, a flexible digital ureteroscopy performed the proximal left ureter and systematic inspection of the kidney and all calices x3.  There were several small papillary tip  calcifications but minimal intraluminal stone.  Total stone volume only approximately 2 mm.  Papillary tip calcifications were ablated using holmium laser energy setting of 0.3 joules and 30 Hz.  The access sheath was then removed under continuous  vision.  No other abnormalities were found.  The access sheath was then placed over the right sensor working wire to the level of the proximal ureter using continuous fluoroscopic guidance.  A flexible digital ureteroscopy was performed of the right  ureter and kidney including all calices x3.  On the right side, there were multifocal intrarenal stones, a dominant upper pole stone and an upper mid pole stone.  Total stone volume approximately 9 mm.  The upper midstone was repositioned to an escape  basket to the level of the extreme upper pole to allow for less angulation and holmium laser energy applied 70 setting of 0.3 joules and 30 Hz.  These were fragmented into pieces approximately 1-2 mm, which were then sequentially grasped with the escape  basket removed and set aside to be given to patient.  Finally, there was excellent hemostasis.  No evidence of renal perforation.  There was complete resolution of all accessible stone fragments larger than one-third mm.  The access sheath was removed under continuous vision, no mucosal abnormalities were   found.  Given bilateral stent usage, it was felt that brief interval stenting with tethered stent would be warranted.  As such, a new 5 x 26 Polaris-type stent was placed over the remaining safety wire bilaterally using cystoscopic and fluoroscopic  guidance.  Good proximal and distal plane were noted, and the procedure terminated.    The patient tolerated the procedure well and there no immediate complications.  The patient was taken to Post Anesthesia Care Unit in stable condition.  AN/NUANCE  D:03/24/2018 T:03/25/2018 JOB:[id number]

## 2018-04-05 NOTE — H&P (Signed)
Devin Sanders is an 38 y.o. male.    Chief Complaint: Pre-op BILATERAL Ureteroscopic Stone Manipulation  HPI:     1 - Recurrent Nephrolithiasis -  Pre 2019 - PCNL several each side, URS x innumerable each side at [facility].  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 CMP, CBC 02/2017 normal, Cr 0.9.   PMH sig for mild obesity. 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 His PCP is [name] MD.   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  NO interval fevers. Most recent UA without infectious parameters.    Past Medical History:  Diagnosis Date  . GERD (gastroesophageal reflux disease)   . History of kidney stones   . Renal calculi    bilateral   . Wears glasses     Past Surgical History:  Procedure Laterality Date  . CYSTOSCOPY WITH RETROGRADE PYELOGRAM, URETEROSCOPY AND STENT PLACEMENT Bilateral 06/26/2017   Procedure: CYSTOSCOPY WITH RETROGRADE PYELOGRAM, URETEROSCOPY, STONE BASKETRY  AND STENT PLACEMENT;  Surgeon: [name], MD;  Location: [facility];  Service: Urology;  Laterality: Bilateral;  . CYSTOSCOPY WITH RETROGRADE PYELOGRAM, URETEROSCOPY AND STENT PLACEMENT Right 09/18/2017   Procedure: CYSTOSCOPY WITH RETROGRADE PYELOGRAM, URETEROSCOPY AND STENT PLACEMENT;  Surgeon: Crist Fat, MD;  Location[facility];  Service: Urology;  Laterality: Right;  . CYSTOSCOPY WITH RETROGRADE PYELOGRAM, URETEROSCOPY AND STENT PLACEMENT Bilateral 10/13/2017   Procedure: CYSTOSCOPY WITH RETROGRADE PYELOGRAM, BILATERAL URETEROSCOPY AND BILATERAL  STENT PLACEMENT, LASER;  Surgeon: [name], MD;  Location: [facility];  Service: Urology;  Laterality: Bilateral;  90 MINS  . CYSTOSCOPY WITH RETROGRADE PYELOGRAM, URETEROSCOPY AND STENT PLACEMENT Bilateral 03/24/2018   Procedure: CYSTOSCOPY WITH RETROGRADE PYELOGRAM, URETEROSCOPY AND STENT PLACEMENT;  Surgeon: [name], MD;  Location[facility];  Service: Urology;  Laterality: Bilateral;  1 HR  . CYSTOSCOPY/RETROGRADE/URETEROSCOPY/STONE EXTRACTION WITH BASKET Left 10/05/2017   Procedure: CYSTOSCOPY/RETROGRADE/STONE REMOVAL FROM BLADDER ;  Surgeon: Bjorn Pippin, MD;  Location: [facility];  Service: Urology;  Laterality: Left;  . CYSTOSCOPY/URETEROSCOPY/HOLMIUM LASER/STENT PLACEMENT Right 09/28/2017   Procedure: CYSTOSCOPY/RIGHT RETROGRADE URETEROSCOPY/HOLMIUM LASER/ RIGHT STENT PLACEMENT;  Surgeon: Rene Paci, MD;  Location[facility];  Service: Urology;  Laterality: Right;  . CYSTOSCOPY/URETEROSCOPY/HOLMIUM LASER/STENT PLACEMENT Bilateral 01/27/2018   Procedure: CYSTOSCOPY/URETEROSCOPY/HOLMIUM LASER/STENT PLACEMENT;  Surgeon: [name], MD;  Location: [facility];  Service: Urology;  Laterality: Bilateral;  . HOLMIUM LASER APPLICATION Bilateral 06/26/2017   Procedure: HOLMIUM LASER APPLICATION;  Surgeon: [name], MD;  Location: [facility];  Service: Urology;  Laterality: Bilateral;  . HOLMIUM LASER APPLICATION Right 09/18/2017   Procedure: HOLMIUM LASER APPLICATION;  Surgeon: Crist Fat, MD;  [facility];  Service: Urology;  Laterality: Right;  . HOLMIUM LASER APPLICATION Bilateral 10/13/2017   Procedure: HOLMIUM LASER APPLICATION;  Surgeon: [name], MD;  [facility];  Service: Urology;  Laterality: Bilateral;  . HOLMIUM LASER APPLICATION Bilateral 01/27/2018   Procedure: HOLMIUM LASER APPLICATION;  Surgeon: [name], MD;  Location: [facility];  Service: Urology;  Laterality: Bilateral;  . HOLMIUM  LASER APPLICATION Bilateral 03/24/2018   Procedure: HOLMIUM LASER APPLICATION;  Surgeon: [name], MD;  Location: [facility];  Service: Urology;  Laterality: Bilateral;  . PERCUTANEOUS NEPHROSTOLITHOTOMY  2003;  2009;  06-22-2014  @ [facility]  . URETEROSCOPIC STONE MANIPULATION UNILATERAL  multiple since 1997--  last one 02-19-2017  @ [facility] by dr gutierrez-azceves    History reviewed. No pertinent family history. Social History:  reports that he has never smoked. He has never used smokeless tobacco. He reports that he does not drink alcohol or use drugs.  Allergies:  Allergies  Allergen Reactions  . Morphine Nausea And Vomiting  . Other Hives and Swelling    MANGO'S    No medications prior to admission.    No results found for this or any previous visit (from the past 48 hour(s)). No results found.  Review of Systems  Constitutional: Negative for chills and fever.  Genitourinary: Positive for flank pain.  All other systems reviewed and are negative.   Blood pressure (!) 135/92, pulse 68, temperature 97.9 F (36.6 C), resp. rate 16, height 5\' 10"  (1.778 m), weight 100.4 kg, SpO2 99 %. Physical Exam  Constitutional: He appears well-developed.  HENT:  Head: Normocephalic.  Cardiovascular: Normal rate.  Respiratory: Effort normal.  GI: Soft.  Genitourinary:    Penis normal.   Musculoskeletal: Normal range of motion.  Neurological: He is alert.  Skin: Skin is warm.  Psychiatric: He has a normal mood and affect.     Assessment/Plan  Pre-op BILATERAL Ureteroscopic Stone Manipulation - proceed as planned with BILATERAL ureteroscopic stone manipulation with goal of stone free. Risks, benefits, expected peri-op course discsussed previously and reiterated today.

## 2018-05-26 ENCOUNTER — Emergency Department (HOSPITAL_COMMUNITY)
Admission: EM | Admit: 2018-05-26 | Discharge: 2018-05-26 | Disposition: A | Discharge status: Home / Self Care | Payer: 59 | Attending: Emergency Medicine | Admitting: Emergency Medicine

## 2018-05-26 ENCOUNTER — Encounter (HOSPITAL_COMMUNITY): Payer: Self-pay | Admitting: Emergency Medicine

## 2018-05-26 ENCOUNTER — Emergency Department (HOSPITAL_COMMUNITY): Payer: 59

## 2018-05-26 ENCOUNTER — Other Ambulatory Visit: Payer: Self-pay

## 2018-05-26 DIAGNOSIS — R1030 Lower abdominal pain, unspecified: Secondary | ICD-10-CM | POA: Diagnosis present

## 2018-05-26 DIAGNOSIS — Z79899 Other long term (current) drug therapy: Secondary | ICD-10-CM | POA: Diagnosis not present

## 2018-05-26 DIAGNOSIS — N200 Calculus of kidney: Secondary | ICD-10-CM | POA: Insufficient documentation

## 2018-05-26 DIAGNOSIS — R109 Unspecified abdominal pain: Secondary | ICD-10-CM

## 2018-05-26 DIAGNOSIS — N201 Calculus of ureter: Secondary | ICD-10-CM

## 2018-05-26 LAB — URINALYSIS, ROUTINE W REFLEX MICROSCOPIC
Bacteria, UA: NONE SEEN
Bilirubin Urine: NEGATIVE
Glucose, UA: NEGATIVE mg/dL
Hgb urine dipstick: NEGATIVE
Ketones, ur: NEGATIVE mg/dL
Leukocytes,Ua: NEGATIVE
Nitrite: NEGATIVE
Protein, ur: NEGATIVE mg/dL
Specific Gravity, Urine: 1.024 (ref 1.005–1.030)
pH: 6 (ref 5.0–8.0)

## 2018-05-26 LAB — BASIC METABOLIC PANEL
Anion gap: 11 (ref 5–15)
BUN: 20 mg/dL (ref 6–20)
CO2: 23 mmol/L (ref 22–32)
Calcium: 9.3 mg/dL (ref 8.9–10.3)
Chloride: 106 mmol/L (ref 98–111)
Creatinine, Ser: 0.95 mg/dL (ref 0.61–1.24)
GFR calc Af Amer: >60 mL/min (ref >60)
GFR calc non Af Amer: >60 mL/min (ref >60)
Glucose, Bld: 100 mg/dL — ABNORMAL HIGH (ref 70–99)
Potassium: 3.3 mmol/L — ABNORMAL LOW (ref 3.5–5.1)
Sodium: 140 mmol/L (ref 135–145)

## 2018-05-26 LAB — CBC
HCT: 43.2 % (ref 39.0–52.0)
Hemoglobin: 14.4 g/dL (ref 13.0–17.0)
MCH: 32.7 pg (ref 26.0–34.0)
MCHC: 33.3 g/dL (ref 30.0–36.0)
MCV: 98 fL (ref 80.0–100.0)
Platelets: 266 10*3/uL (ref 150–400)
RBC: 4.41 MIL/uL (ref 4.22–5.81)
RDW: 11.2 % — ABNORMAL LOW (ref 11.5–15.5)
WBC: 5.7 10*3/uL (ref 4.0–10.5)
nRBC: 0 % (ref 0.0–0.2)

## 2018-05-26 MED ORDER — HYDROCODONE-ACETAMINOPHEN 5-325 MG PO TABS: 1.0000 | ORAL_TABLET | Freq: Four times a day (QID) | ORAL | 0 refills | Status: AC | PRN

## 2018-05-26 MED ORDER — HYDROMORPHONE HCL 1 MG/ML IJ SOLN
1.0000 mg | Freq: Once | INTRAMUSCULAR | Status: AC
  Administered 2018-05-26: 1 mg via INTRAVENOUS
  Filled 2018-05-26: qty 1

## 2018-05-26 MED ORDER — DIPHENHYDRAMINE HCL 50 MG/ML IJ SOLN
12.5000 mg | Freq: Once | INTRAMUSCULAR | Status: AC
  Administered 2018-05-26: 12.5 mg via INTRAVENOUS
  Filled 2018-05-26: qty 1

## 2018-05-26 MED ORDER — PROMETHAZINE HCL 25 MG/ML IJ SOLN
12.5000 mg | Freq: Once | INTRAMUSCULAR | Status: AC
  Administered 2018-05-26: 12.5 mg via INTRAVENOUS
  Filled 2018-05-26: qty 1

## 2018-05-26 MED ORDER — ONDANSETRON HCL 4 MG PO TABS: 4.0000 mg | ORAL_TABLET | Freq: Four times a day (QID) | ORAL | 0 refills | Status: DC

## 2018-05-26 MED ORDER — KETOROLAC TROMETHAMINE 30 MG/ML IJ SOLN
15.0000 mg | Freq: Once | INTRAMUSCULAR | Status: AC
  Administered 2018-05-26: 15 mg via INTRAVENOUS
  Filled 2018-05-26: qty 1

## 2018-05-26 MED ORDER — ONDANSETRON HCL 4 MG/2ML IJ SOLN
4.0000 mg | Freq: Once | INTRAMUSCULAR | Status: AC
  Administered 2018-05-26: 4 mg via INTRAVENOUS
  Filled 2018-05-26: qty 2

## 2018-05-26 MED ORDER — MORPHINE SULFATE (PF) 4 MG/ML IV SOLN
4.0000 mg | Freq: Once | INTRAVENOUS | Status: DC
  Filled 2018-05-26: qty 1

## 2018-05-26 NOTE — Discharge Instructions (Signed)
Your evaluated today for flank pain.  This is possibly from a recently passed stone.  He does not have any evidence of obstructing stones on your CT scan today.  Your lab work and urine was negative.  I would suggest following up with Dr. Berneice Heinrich as scheduled.  Return to the ED for any new or worsening symptoms.

## 2018-05-26 NOTE — ED Notes (Addendum)
Pt requesting pain medication.  

## 2018-05-26 NOTE — ED Triage Notes (Addendum)
Pt arriving POV with bilat flank pain. Pt has hx of kidney stones and has already passed one a few hours prior to arrival. Pt reports having numerous urological interventions in the past. Pt ambulatory. Nausea and vomiting. Reports he is unable to keep food/water down.

## 2018-05-26 NOTE — ED Notes (Signed)
Urine and culture sent to lab  

## 2018-05-26 NOTE — ED Provider Notes (Signed)
[facility]-EMERGENCY DEPT Provider Note   CSN: [id number] Arrival date & time: 05/26/18  0347    History   Chief Complaint Chief Complaint  Patient presents with  . Flank Pain    Bilateral    HPI Devin Sanders is a 38 y.o. male.     Patient with history of kidney stones presents with bilateral flank pain and LLQ abdominal pain, nausea, vomiting. No fever. He denies hematuria. He reports symptoms started around 5:00 pm yesterday (05/25/18) and since then he has passed a single large stone. He continues to have pain and nausea. He sees Dr. Berneice Heinrich and reports requiring regular care for recurrent stones, last procedure for stents was March of this year.   The history is provided by the patient. No language interpreter was used.  Flank Pain     Past Medical History:  Diagnosis Date  . GERD (gastroesophageal reflux disease)   . History of kidney stones   . Renal calculi    bilateral   . Wears glasses     Patient Active Problem List   Diagnosis Date Noted  . Hydronephrosis of right kidney 09/28/2017    Past Surgical History:  Procedure Laterality Date  . CYSTOSCOPY WITH RETROGRADE PYELOGRAM, URETEROSCOPY AND STENT PLACEMENT Bilateral 06/26/2017   Procedure: CYSTOSCOPY WITH RETROGRADE PYELOGRAM, URETEROSCOPY, STONE BASKETRY  AND STENT PLACEMENT;  Surgeon: [name], MD;  [facility];  Service: Urology;  Laterality: Bilateral;  . CYSTOSCOPY WITH RETROGRADE PYELOGRAM, URETEROSCOPY AND STENT PLACEMENT Right 09/18/2017   Procedure: CYSTOSCOPY WITH RETROGRADE PYELOGRAM, URETEROSCOPY AND STENT PLACEMENT;  Surgeon: Crist Fat, MD;  [facility];  Service: Urology;  Laterality: Right;  . CYSTOSCOPY WITH RETROGRADE PYELOGRAM, URETEROSCOPY AND STENT PLACEMENT Bilateral 10/13/2017   Procedure: CYSTOSCOPY WITH RETROGRADE PYELOGRAM, BILATERAL URETEROSCOPY AND BILATERAL STENT PLACEMENT, LASER;  Surgeon: [name], MD;   [facility];  Service: Urology;  Laterality: Bilateral;  90 MINS  . CYSTOSCOPY WITH RETROGRADE PYELOGRAM, URETEROSCOPY AND STENT PLACEMENT Bilateral 03/24/2018   Procedure: CYSTOSCOPY WITH RETROGRADE PYELOGRAM, URETEROSCOPY AND STENT PLACEMENT;  Surgeon: [name], MD;  Location[facility];  Service: Urology;  Laterality: Bilateral;  1 HR  . CYSTOSCOPY/RETROGRADE/URETEROSCOPY/STONE EXTRACTION WITH BASKET Left 10/05/2017   Procedure: CYSTOSCOPY/RETROGRADE/STONE REMOVAL FROM BLADDER ;  Surgeon: Bjorn Pippin, MD;  Location[facility];  Service: Urology;  Laterality: Left;  . CYSTOSCOPY/URETEROSCOPY/HOLMIUM LASER/STENT PLACEMENT Right 09/28/2017   Procedure: CYSTOSCOPY/RIGHT RETROGRADE URETEROSCOPY/HOLMIUM LASER/ RIGHT STENT PLACEMENT;  Surgeon: Rene Paci, MD;  Location: [facility];  Service: Urology;  Laterality: Right;  . CYSTOSCOPY/URETEROSCOPY/HOLMIUM LASER/STENT PLACEMENT Bilateral 01/27/2018   Procedure: CYSTOSCOPY/URETEROSCOPY/HOLMIUM LASER/STENT PLACEMENT;  Surgeon: [name], MD;  Location[facility];  Service: Urology;  Laterality: Bilateral;  . HOLMIUM LASER APPLICATION Bilateral 06/26/2017   Procedure: HOLMIUM LASER APPLICATION;  Surgeon: [name], MD;  Location: [facility];  Service: Urology;  Laterality: Bilateral;  . HOLMIUM LASER APPLICATION Right 09/18/2017   Procedure: HOLMIUM LASER APPLICATION;  Surgeon: Crist Fat, MD;  [facility];  Service: Urology;  Laterality: Right;  . HOLMIUM LASER APPLICATION Bilateral 10/13/2017   Procedure: HOLMIUM LASER APPLICATION;  Surgeon: [name], MD;  Location[facility];  Service: Urology;  Laterality: Bilateral;  . HOLMIUM LASER APPLICATION Bilateral 01/27/2018   Procedure: HOLMIUM LASER APPLICATION;  Surgeon: [name], MD;  Location: [facility];  Service: Urology;  Laterality: Bilateral;  . HOLMIUM LASER APPLICATION Bilateral 03/24/2018   Procedure:  HOLMIUM  LASER APPLICATION;  Surgeon: Sebastian AcheManny, Theodore, MD;  Location: [facility];  Service: Urology;  Laterality: Bilateral;  . PERCUTANEOUS NEPHROSTOLITHOTOMY  2003;  2009;  06[facility]22-2014  [email or web address]  . URETEROSCOPIC STONE MANIPULATION UNILATERAL  multiple since 1997--  last one 02-19-2017  [facility] [email or web address]-azceves        Home Medications    Prior to Admission medications   Medication Sig Start Date End Date Taking? Authorizing Provider  cephALEXin (KEFLEX) 500 MG capsule Take 1 capsule (500 mg total) by mouth 2 (two) times daily. X 2 days to prevent infection with tethered stents 03/24/18   Sebastian AcheManny, Theodore, MD  oxyCODONE-acetaminophen (PERCOCET) 5-325 MG tablet Take 1-2 tablets by mouth every 6 (six) hours as needed for severe pain. Post-operatively 03/24/18 03/24/19  Sebastian AcheManny, Theodore, MD  Potassium Citrate 15 MEQ (1620 MG) TBCR Take 2 tablets by mouth 2 (two) times daily. 07/21/17   [provider]  tamsulosin (FLOMAX) 0.4 MG CAPS capsule Take 0.4 mg by mouth daily as needed (kidney stones).    [provider]  tiopronin (THIOLA) 100 MG tablet Take 200 mg by mouth 3 (three) times daily.  07/23/16   [provider]    Family History History reviewed. No pertinent family history.  Social History Social History   Tobacco Use  . Smoking status: Never Smoker  . Smokeless tobacco: Never Used  Substance Use Topics  . Alcohol use: Never    Frequency: Never  . Drug use: Never     Allergies   Morphine and Other   Review of Systems Review of Systems  Constitutional: Negative for chills and fever.  Gastrointestinal: Positive for nausea and vomiting.  Genitourinary: Positive for flank pain. Negative for hematuria.  Neurological: Negative.      Physical Exam Updated Vital Signs BP (!) 149/100 (BP Location: Left Arm)   Pulse 88   Temp 97.9 F (36.6 C) (Oral)   Resp 16   Ht 5\' 10"  (1.778 m)   Wt 97.5 kg   SpO2 98%   BMI 30.85 kg/m   Physical Exam  Constitutional:      Appearance: He is well-developed.  Neck:     Musculoskeletal: Normal range of motion.  Pulmonary:     Effort: Pulmonary effort is normal.  Abdominal:     General: There is no distension.     Tenderness: There is no abdominal tenderness. There is no guarding.  Genitourinary:    Comments: Bilateral flank tenderness.  Musculoskeletal: Normal range of motion.  Skin:    General: Skin is warm and dry.  Neurological:     Mental Status: He is alert and oriented to person, place, and time.      ED Treatments / Results  Labs (all labs ordered are listed, but only abnormal results are displayed) Labs Reviewed  BASIC METABOLIC PANEL - Abnormal; Notable for the following components:      Result Value   Potassium 3.3 (*)    Glucose, Bld 100 (*)    All other components within normal limits  CBC - Abnormal; Notable for the following components:   RDW 11.2 (*)    All other components within normal limits  URINALYSIS, ROUTINE W REFLEX MICROSCOPIC   Results for orders placed or performed during the hospital encounter of 05/26/18  Urinalysis, Routine w reflex microscopic- may I&O cath if menses  Result Value Ref Range   Color, Urine YELLOW YELLOW   APPearance HAZY (A) CLEAR   Specific Gravity, Urine 1.024  1.005 - 1.030   pH 6.0 5.0 - 8.0   Glucose, UA NEGATIVE NEGATIVE mg/dL   Hgb urine dipstick NEGATIVE NEGATIVE   Bilirubin Urine NEGATIVE NEGATIVE   Ketones, ur NEGATIVE NEGATIVE mg/dL   Protein, ur NEGATIVE NEGATIVE mg/dL   Nitrite NEGATIVE NEGATIVE   Leukocytes,Ua NEGATIVE NEGATIVE   RBC / HPF 0-5 0 - 5 RBC/hpf   WBC, UA 0-5 0 - 5 WBC/hpf   Bacteria, UA NONE SEEN NONE SEEN   Squamous Epithelial / LPF 0-5 0 - 5   Mucus PRESENT    Amorphous Crystal PRESENT   Basic metabolic panel  Result Value Ref Range   Sodium 140 135 - 145 mmol/L   Potassium 3.3 (L) 3.5 - 5.1 mmol/L   Chloride 106 98 - 111 mmol/L   CO2 23 22 - 32 mmol/L   Glucose, Bld 100 (H) 70 - 99  mg/dL   BUN 20 6 - 20 mg/dL   Creatinine, Ser 8.87 0.61 - 1.24 mg/dL   Calcium 9.3 8.9 - 57.9 mg/dL   GFR calc non Af Amer >60 >60 mL/min   GFR calc Af Amer >60 >60 mL/min   Anion gap 11 5 - 15  CBC  Result Value Ref Range   WBC 5.7 4.0 - 10.5 K/uL   RBC 4.41 4.22 - 5.81 MIL/uL   Hemoglobin 14.4 13.0 - 17.0 g/dL   HCT 72.8 20.6 - 01.5 %   MCV 98.0 80.0 - 100.0 fL   MCH 32.7 26.0 - 34.0 pg   MCHC 33.3 30.0 - 36.0 g/dL   RDW 61.5 (L) 37.9 - 43.2 %   Platelets 266 150 - 400 K/uL   nRBC 0.0 0.0 - 0.2 %    EKG None  Radiology No results found.  Procedures Procedures (including critical care time)  Medications Ordered in ED Medications  HYDROmorphone (DILAUDID) injection 1 mg (1 mg Intravenous Given 05/26/18 0413)  ondansetron (ZOFRAN) injection 4 mg (4 mg Intravenous Given 05/26/18 0413)     Initial Impression / Assessment and Plan / ED Course  I have reviewed the triage vital signs and the nursing notes.  Pertinent labs & imaging results that were available during my care of the patient were reviewed by me and considered in my medical decision making (see chart for details).        Patient with a history of kidney stones, followed by Dr. Berneice Heinrich, presents with symptoms flank pain, nausea, vomiting, having produced a large urinary stone this evening, with continued pain. No fever.   Chart reviewed. His history of kidney stone is extensive. He has received Dilaudid and Zofran and is getting some degree of relief. Imaging is not felt needed with well documented h/o stones, and symptoms c/w ureterolithiasis. This was discussed with Dr. Read Drivers who agrees.  6:00 - Pain has become severe again, nausea returned without vomiting. VSS. Labs show normal renal function, UA without blood or infection. Additional pain medication ordered - Phenergan, Toradol, Dilaudid.   7:20 - He states his pain is "30%" improved. Nausea is better. Patient care signed out to [facility], PA-C, for  re-evaluation.  Plan: if pain is not more controlled, consider discussing the patient's condition and care plan with urology, possibly arranging close follow up outpatient. ? Need for admission for pain control?  Final Clinical Impressions(s) / ED Diagnoses   Final diagnoses:  None   1. Ureterolithiasis  ED Discharge Orders    None       Lafawn Lenoir,  Melvenia Beam, PA-C 05/26/18 0736    Molpus, Jonny Ruiz, MD 05/26/18 1316

## 2018-05-26 NOTE — ED Notes (Signed)
Patient states he is allergic to morphine.

## 2018-05-26 NOTE — ED Provider Notes (Signed)
[place]. See note for full HPI.  In summation, "Patient with history of kidney stones presents with bilateral flank pain and LLQ abdominal pain, nausea, vomiting. No fever. He denies hematuria. He reports symptoms started around 5:00 pm yesterday (05/25/18) and since then he has passed a single large stone. He continues to have pain and nausea. He sees Dr. Berneice HeinrichManny and reports requiring regular care for recurrent stones, last procedure for stents was March of this year."   Previous provider did not feel repeat imaging necessary at this time as pain is similar to previous stone disease. Urination without difficulty.  Lab reassuring. Denies diarrhea, patient, melena, hematochezia, hematemesis, focal left lower quadrant or right lower quadrant abdominal pain, testicular pain, penile discharge or penile lesions, midline abdominal pain that radiates into his back.  Nausea and pain with "30% improvement" on last recheck. Plan for reassessment. If pain not controlled can consult with Urology for disposition.   Physical Exam  BP 131/82   Pulse (!) 102   Temp 97.9 F (36.6 C) (Oral)   Resp 18   Ht 5\' 10"  (1.778 m)   Wt 97.5 kg   SpO2 97%   BMI 30.85 kg/m   Physical Exam Vitals signs and nursing note reviewed.  Constitutional:      General: He is not in acute distress.    Appearance: He is well-developed. He is not ill-appearing, toxic-appearing or diaphoretic.     Comments: Texting in bed on initial evaluation.  No acute distress noted.  HENT:     Head: Atraumatic.  Eyes:     Pupils: Pupils are equal, round, and reactive to light.  Neck:     Musculoskeletal: Normal range of motion and neck supple.  Cardiovascular:     Rate and Rhythm: Normal rate and regular rhythm.  Pulmonary:     Effort: Pulmonary effort is normal. No respiratory distress.  Abdominal:     General: Bowel sounds are normal. There is no distension.     Palpations: Abdomen is soft.     Tenderness: There is no abdominal tenderness.     Hernia: No hernia is present.     Comments: Soft, Nontender without rebound or guarding.  Negative CVA tap bilaterally.  No focal right lower quadrant left lower quadrant tenderness palpation. No obvious abd hernia.  Genitourinary:    Comments: Patient refuses GU exam.  Denies testicular or suprapubic pain. Musculoskeletal: Normal range of motion.     Comments: Moves all 4 extremities without difficulty.  Skin:    General: Skin is warm and dry.     Capillary Refill: Capillary refill takes less than 2 seconds.     Comments: No rashes or lesions.  Brisk capillary refill.  No jaundice.  Neurological:     Mental Status: He is alert.    ED Course/Procedures    Procedures Labs Reviewed  URINALYSIS, ROUTINE W REFLEX MICROSCOPIC - Abnormal; Notable for the following components:      Result Value   APPearance HAZY (*)    All other components within normal limits  BASIC METABOLIC PANEL - Abnormal; Notable for the following components:   Potassium 3.3 (*)    Glucose, Bld 100 (*)    All other components within normal limits  CBC - Abnormal; Notable for the following components:   RDW 11.2 (*)    All other components within normal limits   MDM  38 year old who appears otherwise well presents for evaluation  of flank pain. Hx of recurrent stone disease. Followed by Dr. Unknown Foley outpatient. Last seen in March with stent placement. Labs reassuring without admiralties. Urine without blood. Sx similar to previous stone. Low suspicion for dissection, diverticulitis, perforated viscus.   Plan for reassessment.   REEVAL-- Pain increasing to 9/10. Will consult with Urology for intractable pain.  0569Marland Kitchen Consulted with Urology Dr. Laverle Patter who request CT stone and call back to discussed results.  1045: Patient reassessed after additional dose of pain medicine.  Pain well controlled at this time.  CT scan does not show any evidence of ureteral stones or  obstruction.  On reevaluation abdomen soft, nontender without rebound or guarding.  He has a nonsurgical abdomen.  No focal right lower quadrant or left lower quadrant pain to suggest appendicitis or diverticulitis.  I have low suspicion for this.  His CT scan does not show any evidence of bowel inflammation or stranding.  He has no evidence of bowel perforation on his CT scan.  He is tolerating p.o. intake in ED without difficulty.  Unsure if patient's pain is residual pain from a recently passed stone.  He did recently pass additional stone in the ED.  He has no urinary symptoms.  Patient refused GU exam.  This risk versus benefit.  Patient voiced understanding risk versus benefit and continues to decline exam.  I have low suspicion for testicular torsion as cause of his pain.  He does have fat-containing hernias on CT scan.  I have discussed CT with Urology, Dr. Laverle Patter.  Patient to follow-up in office with Dr. Walden Field given his recurrent stones.  DC patient home with pain medicine.  Discussed return precautions with patient.  Patient is nontoxic, nonseptic appearing, in no apparent distress.  Patient's pain and other symptoms adequately managed in emergency department.  Fluid bolus given.  Labs, imaging and vitals reviewed.  Patient does not meet the SIRS or Sepsis criteria.  On repeat exam patient does not have a surgical abdomin and there are no peritoneal signs.  No indication of appendicitis, bowel obstruction, bowel perforation, cholecystitis, diverticulitis.    Patient is hemodynamically stable and in no acute distress.  Patient able to ambulate in department prior to ED.  Evaluation does not show acute pathology that would require ongoing or additional emergent interventions while in the emergency department or further inpatient treatment.  I have discussed the diagnosis with the patient and answered all questions.  Pain is been managed while in the emergency department and patient has no further  complaints prior to discharge.  Patient is comfortable with plan discussed in room and is stable for discharge at this time.  I have discussed strict return precautions for returning to the emergency department.  Patient was encouraged to follow-up with PCP/specialist refer to at discharge.  I have discussed patient with my attending physician, Dr. Rodena Medin who agrees with above treatment, plan and disposition.       Linwood Dibbles, PA-C 05/26/18 1058    Wynetta Fines, MD 05/26/18 1451

## 2018-06-06 ENCOUNTER — Other Ambulatory Visit: Payer: Self-pay

## 2018-06-06 ENCOUNTER — Encounter (HOSPITAL_COMMUNITY): Payer: Self-pay

## 2018-06-06 ENCOUNTER — Emergency Department (HOSPITAL_COMMUNITY)
Admission: EM | Admit: 2018-06-06 | Discharge: 2018-06-06 | Disposition: A | Discharge status: Home / Self Care | Payer: 59 | Attending: Emergency Medicine | Admitting: Emergency Medicine

## 2018-06-06 ENCOUNTER — Emergency Department (HOSPITAL_COMMUNITY): Payer: 59

## 2018-06-06 DIAGNOSIS — R109 Unspecified abdominal pain: Secondary | ICD-10-CM | POA: Diagnosis present

## 2018-06-06 DIAGNOSIS — Z79899 Other long term (current) drug therapy: Secondary | ICD-10-CM | POA: Insufficient documentation

## 2018-06-06 DIAGNOSIS — N23 Unspecified renal colic: Secondary | ICD-10-CM | POA: Diagnosis not present

## 2018-06-06 LAB — BASIC METABOLIC PANEL
Anion gap: 9 (ref 5–15)
BUN: 19 mg/dL (ref 6–20)
CO2: 23 mmol/L (ref 22–32)
Calcium: 9.4 mg/dL (ref 8.9–10.3)
Chloride: 108 mmol/L (ref 98–111)
Creatinine, Ser: 1.05 mg/dL (ref 0.61–1.24)
GFR calc Af Amer: >60 mL/min (ref >60)
GFR calc non Af Amer: >60 mL/min (ref >60)
Glucose, Bld: 93 mg/dL (ref 70–99)
Potassium: 4.3 mmol/L (ref 3.5–5.1)
Sodium: 140 mmol/L (ref 135–145)

## 2018-06-06 LAB — URINALYSIS, ROUTINE W REFLEX MICROSCOPIC
Bilirubin Urine: NEGATIVE
Glucose, UA: NEGATIVE mg/dL
Hgb urine dipstick: NEGATIVE
Ketones, ur: 5 mg/dL — AB
Leukocytes,Ua: NEGATIVE
Nitrite: NEGATIVE
Protein, ur: NEGATIVE mg/dL
Specific Gravity, Urine: 1.029 (ref 1.005–1.030)
pH: 6 (ref 5.0–8.0)

## 2018-06-06 LAB — CBC WITH DIFFERENTIAL/PLATELET
Abs Immature Granulocytes: 0.01 10*3/uL (ref 0.00–0.07)
Basophils Absolute: 0 10*3/uL (ref 0.0–0.1)
Basophils Relative: 0 %
Eosinophils Absolute: 0.1 10*3/uL (ref 0.0–0.5)
Eosinophils Relative: 1 %
HCT: 42.7 % (ref 39.0–52.0)
Hemoglobin: 14.8 g/dL (ref 13.0–17.0)
Immature Granulocytes: 0 %
Lymphocytes Relative: 23 %
Lymphs Abs: 1.6 10*3/uL (ref 0.7–4.0)
MCH: 33.7 pg (ref 26.0–34.0)
MCHC: 34.7 g/dL (ref 30.0–36.0)
MCV: 97.3 fL (ref 80.0–100.0)
Monocytes Absolute: 0.5 10*3/uL (ref 0.1–1.0)
Monocytes Relative: 7 %
Neutro Abs: 4.7 10*3/uL (ref 1.7–7.7)
Neutrophils Relative %: 69 %
Platelets: 256 10*3/uL (ref 150–400)
RBC: 4.39 MIL/uL (ref 4.22–5.81)
RDW: 11 % — ABNORMAL LOW (ref 11.5–15.5)
WBC: 6.9 10*3/uL (ref 4.0–10.5)
nRBC: 0 % (ref 0.0–0.2)

## 2018-06-06 MED ORDER — SODIUM CHLORIDE 0.9 % IV SOLN
1.5000 mg/kg | Freq: Once | INTRAVENOUS | Status: AC
  Administered 2018-06-06: 146 mg via INTRAVENOUS
  Filled 2018-06-06: qty 7.3

## 2018-06-06 MED ORDER — ONDANSETRON HCL 4 MG/2ML IJ SOLN
4.0000 mg | Freq: Once | INTRAMUSCULAR | Status: AC
  Administered 2018-06-06: 4 mg via INTRAVENOUS
  Filled 2018-06-06: qty 2

## 2018-06-06 MED ORDER — HYDROMORPHONE HCL 1 MG/ML IJ SOLN
1.0000 mg | Freq: Once | INTRAMUSCULAR | Status: AC
  Administered 2018-06-06: 1 mg via INTRAVENOUS
  Filled 2018-06-06: qty 1

## 2018-06-06 MED ORDER — SODIUM CHLORIDE 0.9 % IV BOLUS
1000.0000 mL | Freq: Once | INTRAVENOUS | Status: AC
  Administered 2018-06-06: 1000 mL via INTRAVENOUS

## 2018-06-06 MED ORDER — KETOROLAC TROMETHAMINE 15 MG/ML IJ SOLN
15.0000 mg | Freq: Once | INTRAMUSCULAR | Status: AC
  Administered 2018-06-06: 15 mg via INTRAVENOUS
  Filled 2018-06-06: qty 1

## 2018-06-06 NOTE — ED Triage Notes (Signed)
Patient reports that he has a right kidney stone and is c/o right lower abdominal pain and nausea since last night. Patient denies any issues with urinating.

## 2018-06-06 NOTE — ED Provider Notes (Signed)
[facility]-EMERGENCY DEPT Provider Note   CSN: [id number] Arrival date & time: 06/06/18  1709    History   Chief Complaint Chief Complaint  Patient presents with  . Abdominal Pain    HPI Devin Sanders is a 38 y.o. male who presents with R flank pain. PMH significant for recurrent kidney stones (cystine stones). His urologist is Dr[facility]. He has required multiple interventions in the past - last time was in March. He reports R flank pain with N/V that started yesterday. The pain is in the RLQ. It radiates to the groin and flank. Feels like prior kidney stones. He denies fever. No dysuria or hematuria. Pain is severe so he came to the ED.     HPI  Past Medical History:  Diagnosis Date  . GERD (gastroesophageal reflux disease)   . History of kidney stones   . Renal calculi    bilateral   . Wears glasses     Patient Active Problem List   Diagnosis Date Noted  . Hydronephrosis of right kidney 09/28/2017    Past Surgical History:  Procedure Laterality Date  . CYSTOSCOPY WITH RETROGRADE PYELOGRAM, URETEROSCOPY AND STENT PLACEMENT Bilateral 06/26/2017   Procedure: CYSTOSCOPY WITH RETROGRADE PYELOGRAM, URETEROSCOPY, STONE BASKETRY  AND STENT PLACEMENT;  Surgeon: Sebastian AcheManny, Theodore, MD;  [facility] [place];  Service: Urology;  Laterality: Bilateral;  . CYSTOSCOPY WITH RETROGRADE PYELOGRAM, URETEROSCOPY AND STENT PLACEMENT Right 09/18/2017   Procedure: CYSTOSCOPY WITH RETROGRADE PYELOGRAM, URETEROSCOPY AND STENT PLACEMENT;  Surgeon: Crist FatHerrick, Benjamin W, MD;  Location: [facility];  Service: Urology;  Laterality: Right;  . CYSTOSCOPY WITH RETROGRADE PYELOGRAM, URETEROSCOPY AND STENT PLACEMENT Bilateral 10/13/2017   Procedure: CYSTOSCOPY WITH RETROGRADE PYELOGRAM, BILATERAL URETEROSCOPY AND BILATERAL STENT PLACEMENT, LASER;  Surgeon: Sebastian AcheManny, Theodore, MD;  Location: [facility];  Service: Urology;  Laterality: Bilateral;  90 MINS  . CYSTOSCOPY  WITH RETROGRADE PYELOGRAM, URETEROSCOPY AND STENT PLACEMENT Bilateral 03/24/2018   Procedure: CYSTOSCOPY WITH RETROGRADE PYELOGRAM, URETEROSCOPY AND STENT PLACEMENT;  Surgeon: Sebastian AcheManny, Theodore, MD;  Location: [facility];  Service: Urology;  Laterality: Bilateral;  1 HR  . CYSTOSCOPY/RETROGRADE/URETEROSCOPY/STONE EXTRACTION WITH BASKET Left 10/05/2017   Procedure: CYSTOSCOPY/RETROGRADE/STONE REMOVAL FROM BLADDER ;  Surgeon: Bjorn PippinWrenn, John, MD;  Location: [facility];  Service: Urology;  Laterality: Left;  . CYSTOSCOPY/URETEROSCOPY/HOLMIUM LASER/STENT PLACEMENT Right 09/28/2017   Procedure: CYSTOSCOPY/RIGHT RETROGRADE URETEROSCOPY/HOLMIUM LASER/ RIGHT STENT PLACEMENT;  Surgeon: Rene PaciWinter, Christopher Aaron, MD;  Location: [facility];  Service: Urology;  Laterality: Right;  . CYSTOSCOPY/URETEROSCOPY/HOLMIUM LASER/STENT PLACEMENT Bilateral 01/27/2018   Procedure: CYSTOSCOPY/URETEROSCOPY/HOLMIUM LASER/STENT PLACEMENT;  Surgeon: Sebastian AcheManny, Theodore, MD;  [facility] [place];  Service: Urology;  Laterality: Bilateral;  . HOLMIUM LASER APPLICATION Bilateral 06/26/2017   Procedure: HOLMIUM LASER APPLICATION;  Surgeon: Sebastian AcheManny, Theodore, MD;  [facility] [place];  Service: Urology;  Laterality: Bilateral;  . HOLMIUM LASER APPLICATION Right 09/18/2017   Procedure: HOLMIUM LASER APPLICATION;  Surgeon: Crist FatHerrick, Benjamin W, MD;  Location: [facility];  Service: Urology;  Laterality: Right;  . HOLMIUM LASER APPLICATION Bilateral 10/13/2017   Procedure: HOLMIUM LASER APPLICATION;  Surgeon: Sebastian AcheManny, Theodore, MD;  Location: [facility];  Service: Urology;  Laterality: Bilateral;  . HOLMIUM LASER APPLICATION Bilateral 01/27/2018   Procedure: HOLMIUM LASER APPLICATION;  Surgeon: Sebastian AcheManny, Theodore, MD;  [facility] [place];  Service: Urology;  Laterality: Bilateral;  . HOLMIUM LASER APPLICATION Bilateral 03/24/2018   Procedure: HOLMIUM LASER APPLICATION;  Surgeon: Sebastian AcheManny, Theodore, MD;  Location: [facility];  Service:  Urology;  Laterality: Bilateral;  . PERCUTANEOUS NEPHROSTOLITHOTOMY  2003;  2009;  06-22-2014  @ [facility]  . URETEROSCOPIC STONE MANIPULATION UNILATERAL  multiple since 1997--  last one 02-19-2017  @ [facility] by dr [email or web address]        Home Medications    Prior to Admission medications   Medication Sig Start Date End Date Taking? Authorizing Provider  ondansetron (ZOFRAN) 4 MG tablet Take 1 tablet (4 mg total) by mouth every 6 (six) hours. Patient taking differently: Take 4 mg by mouth every 6 (six) hours as needed for nausea or vomiting.  05/26/18  Yes Henderly, Britni A, PA-C  potassium chloride SA (K-DUR) 20 MEQ tablet Take 20 mEq by mouth 2 (two) times daily.   Yes [provider]  tiopronin (THIOLA) 100 MG tablet Take 200 mg by mouth 3 (three) times daily.  07/23/16  Yes [provider]    Family History History reviewed. No pertinent family history.  Social History Social History   Tobacco Use  . Smoking status: Never Smoker  . Smokeless tobacco: Never Used  Substance Use Topics  . Alcohol use: Never    Frequency: Never  . Drug use: Never     Allergies   Morphine and Other   Review of Systems Review of Systems  Constitutional: Negative for fever.  Gastrointestinal: Positive for abdominal pain, nausea and vomiting.  Genitourinary: Positive for flank pain.  All other systems reviewed and are negative.    Physical Exam Updated Vital Signs BP (!) 145/105 (BP Location: Right Arm)   Pulse 100   Temp 98.3 F (36.8 C) (Oral)   Resp 16   Ht 5\' 10"  (1.778 m)   Wt 97.5 kg   SpO2 97%   BMI 30.85 kg/m   Physical Exam Vitals signs and nursing note reviewed.  Constitutional:      General: He is not in acute distress.    Appearance: He is well-developed. He is not ill-appearing.  HENT:     Head: Normocephalic and atraumatic.  Eyes:     General: No scleral icterus.       Right eye: No discharge.        Left eye: No discharge.      Conjunctiva/sclera: Conjunctivae normal.     Pupils: Pupils are equal, round, and reactive to light.  Neck:     Musculoskeletal: Normal range of motion.  Cardiovascular:     Rate and Rhythm: Normal rate and regular rhythm.  Pulmonary:     Effort: Pulmonary effort is normal. No respiratory distress.     Breath sounds: Normal breath sounds.  Abdominal:     General: There is no distension.     Palpations: Abdomen is soft.     Tenderness: There is abdominal tenderness (RUQ and RLQ tenderness). There is right CVA tenderness.  Skin:    General: Skin is warm and dry.  Neurological:     Mental Status: He is alert and oriented to person, place, and time.  Psychiatric:        Behavior: Behavior normal.      ED Treatments / Results  Labs (all labs ordered are listed, but only abnormal results are displayed) Labs Reviewed  URINALYSIS, ROUTINE W REFLEX MICROSCOPIC - Abnormal; Notable for the following components:      Result Value   Ketones, ur 5 (*)    All other components within normal limits  CBC WITH DIFFERENTIAL/PLATELET - Abnormal; Notable for the following components:   RDW 11.0 (*)  All other components within normal limits  BASIC METABOLIC PANEL    EKG None  Radiology US Renal  Result Date: 06/06/2018 CLINICAL DATA:  Pain.  Known renal stones. EXAM: RENAL / URINARY TRACT ULTRASOUND COMPLETE COMPARISON:  CT scan May 26, 2018 FINDINGS: Right Kidney: Renal measurements: 13.4 by 6.2 x 5.2 cm = volume: 194.2 mL. Contains a 10 mm stone in the upper pole and a 4.6 mm stone in the lower pole. No hydronephrosis. Left Kidney: Renal measurements: 13.0 x 5.7 x 6.0 cm = volume: 231.1 mL. Contains a 4.8 mm stone. No hydronephrosis. Bladder: The bladder was decompressed and not evaluated. IMPRESSION: Bilateral nonobstructive renal stones.  No hydronephrosis. Electronically Signed   By: Gerome Sam III M.D   On: 06/06/2018 19:29    Procedures Procedures (including critical care time)   Medications Ordered in ED Medications  lidocaine (XYLOCAINE) 146 mg in sodium chloride 0.9 % 100 mL IVPB (has no administration in time range)  HYDROmorphone (DILAUDID) injection 1 mg (1 mg Intravenous Given 06/06/18 1831)  ondansetron (ZOFRAN) injection 4 mg (4 mg Intravenous Given 06/06/18 1831)  ketorolac (TORADOL) 15 MG/ML injection 15 mg (15 mg Intravenous Given 06/06/18 1831)  sodium chloride 0.9 % bolus 1,000 mL (1,000 mLs Intravenous New Bag/Given 06/06/18 1830)  HYDROmorphone (DILAUDID) injection 1 mg (1 mg Intravenous Given 06/06/18 1939)     Initial Impression / Assessment and Plan / ED Course  I have reviewed the triage vital signs and the nursing notes.  Pertinent labs & imaging results that were available during my care of the patient were reviewed by me and considered in my medical decision making (see chart for details).  38 year old male presents with R flank and RLQ pain that started this morning with N/V. He has an extensive hx of kidney stones and some times requires surgery for this. He feels the stone is near the bladder. Vitals are normal. He is tender over the R flank and RLQ. PT feels that it is exactly the same as prior kidney stones and although does have an appendix I do not feel he needs another CT, especially since he had one just 10 days ago. Will obtain labs, UA, Renal US.  7:21 PM Informed by nursing pt passed a large stone. Pt showed me a sample where there was a small white stone. He is not feeling much better. Will give additional dose of meds and reassessed.  CBC is normal. BMP is normal. UA does not show infection. Renal US does not show obstructing stones or hydro. He is still having pain that is ~7/10. Will try IV lidocaine.  10:03 PM Pt is feeling better. Will d/c home. He is comfortable with plan.      Final Clinical Impressions(s) / ED Diagnoses   Final diagnoses:  Renal colic on right side    ED Discharge Orders    None       Bethel Born, PA-C 06/06/18 2203    Charlynne Pander, MD 06/06/18 [phone]

## 2018-06-06 NOTE — ED Notes (Signed)
"  I peed and passed a big one [place] stone]." Provider made aware.

## 2018-06-25 ENCOUNTER — Other Ambulatory Visit: Payer: Self-pay | Admitting: Urology

## 2018-07-03 ENCOUNTER — Other Ambulatory Visit (HOSPITAL_COMMUNITY)
Admission: RE | Admit: 2018-07-03 | Discharge: 2018-07-03 | Disposition: A | Discharge status: Home / Self Care | Payer: 59 | Source: Ambulatory Visit | Attending: Urology | Admitting: Urology

## 2018-07-03 DIAGNOSIS — Z1159 Encounter for screening for other viral diseases: Secondary | ICD-10-CM | POA: Diagnosis not present

## 2018-07-03 LAB — SARS CORONAVIRUS 2 (TAT 6-24 HRS): SARS Coronavirus 2: NEGATIVE

## 2018-07-05 NOTE — Patient Instructions (Addendum)
. ONCE YOUR COVID TEST IS COMPLETED, PLEASE BEGIN THE QUARANTINE INSTRUCTIONS AS OUTLINED IN YOUR HANDOUT.                Devin Sanders    Your procedure is scheduled on: 07-07-2018   [facility]  Entrance Report to admitting at 1:45 PM      Call this number if you have problems the morning of surgery [phone]     Remember: Do not eat food  :After Midnight.  CLEAR LIQUIDS FROM MIDNIGHT UNTIL 9:45 AM . NOTHING BY MOUTH AFTER 9:45 AM.    BRUSH YOUR TEETH MORNING OF SURGERY AND RINSE YOUR MOUTH OUT, NO CHEWING GUM CANDY OR MINTS.     Take these medicines the morning of surgery with A SIP OF WATER:                                You may not have any metal on your body including piercings              Do not wear jewelry, , lotions, powders or , deodorant                        Men may shave face and neck.   Do not bring valuables to the hospital. [facility] IS NOT             RESPONSIBLE   FOR VALUABLES.  Contacts, dentures or bridgework may not be worn into surgery.      Patients discharged the day of surgery will not be allowed to drive home.  IF YOU ARE HAVING SURGERY AND GOING HOME THE SAME DAY, YOU MUST HAVE AN ADULT TO DRIVE YOU HOME AND BE WITH YOU FOR 24 HOURS.  YOU MAY GO HOME BY TAXI OR UBER OR ORTHERWISE, BUT AN ADULT MUST ACCOMPANY YOU HOME AND STAY WITH YOU FOR 24 HOURS.  Name and phone number of your driver:  Special Instructions: N/A              Please read over the following fact sheets you were given: _____________________________________________________________________             [facility] - Preparing for Surgery  Before surgery, you can play an important role.   Because skin is not sterile, your skin needs to be as free of germs as possible .  You can reduce the number of germs on your skin by washing with CHG (chlorahexidine gluconate) soap before surgery.   CHG is an antiseptic cleaner which kills germs and bonds  with the skin to continue killing germs even after washing. Please DO NOT use if you have an allergy to CHG or antibacterial soaps.   If your skin becomes reddened/irritated stop using the CHG and inform your nurse when you arrive at Short Stay.   You may shave your face/neck. Please follow these instructions carefully:  1.  Shower with CHG Soap the night before surgery and the  morning of Surgery.  2.  If you choose to wash your hair, wash your hair first as usual with your  normal  shampoo.  3.  After you shampoo, rinse your hair and body thoroughly to remove the  shampoo.  4.  Use CHG as you would any other liquid soap.  You can apply chg directly  to the skin and wash                       Gently with a scrungie or clean washcloth.  5.  Apply the CHG Soap to your body ONLY FROM THE NECK DOWN.   Do not use on face/ open                           Wound or open sores. Avoid contact with eyes, ears mouth and genitals (private parts).                       Wash face,  Genitals (private parts) with your normal soap.             6.  Wash thoroughly, paying special attention to the area where your surgery  will be performed.  7.  Thoroughly rinse your body with warm water from the neck down.  8.  DO NOT shower/wash with your normal soap after using and rinsing off  the CHG Soap.             9.  Pat yourself dry with a clean towel.            10.  Wear clean pajamas.            11.  Place clean sheets on your bed the night of your first shower and do not  sleep with pets.  Day of Surgery : Do not apply any lotions/deodorants the morning of surgery.  Please wear clean clothes to the hospital/surgery center.   FAILURE TO FOLLOW THESE INSTRUCTIONS MAY RESULT IN THE CANCELLATION OF YOUR SURGERY PATIENT SIGNATURE_________________________________  NURSE  SIGNATURE__________________________________  ________________________________________________________________________

## 2018-07-06 ENCOUNTER — Encounter (HOSPITAL_COMMUNITY): Payer: Self-pay

## 2018-07-06 ENCOUNTER — Other Ambulatory Visit: Payer: Self-pay

## 2018-07-06 ENCOUNTER — Encounter (HOSPITAL_COMMUNITY)
Admission: RE | Admit: 2018-07-06 | Discharge: 2018-07-06 | Disposition: A | Discharge status: Home / Self Care | Payer: 59 | Source: Ambulatory Visit | Attending: Urology | Admitting: Urology

## 2018-07-06 DIAGNOSIS — Z888 Allergy status to other drugs, medicaments and biological substances status: Secondary | ICD-10-CM | POA: Diagnosis not present

## 2018-07-06 DIAGNOSIS — N200 Calculus of kidney: Secondary | ICD-10-CM | POA: Diagnosis not present

## 2018-07-06 DIAGNOSIS — Z6830 Body mass index (BMI) 30.0-30.9, adult: Secondary | ICD-10-CM | POA: Diagnosis not present

## 2018-07-06 DIAGNOSIS — Z87442 Personal history of urinary calculi: Secondary | ICD-10-CM | POA: Diagnosis not present

## 2018-07-06 DIAGNOSIS — E669 Obesity, unspecified: Secondary | ICD-10-CM | POA: Diagnosis not present

## 2018-07-06 DIAGNOSIS — Z885 Allergy status to narcotic agent status: Secondary | ICD-10-CM | POA: Diagnosis not present

## 2018-07-06 DIAGNOSIS — E7201 Cystinuria: Secondary | ICD-10-CM | POA: Diagnosis not present

## 2018-07-06 DIAGNOSIS — K219 Gastro-esophageal reflux disease without esophagitis: Secondary | ICD-10-CM | POA: Diagnosis not present

## 2018-07-06 LAB — CBC
HCT: 42.8 % (ref 39.0–52.0)
Hemoglobin: 14.8 g/dL (ref 13.0–17.0)
MCH: 33.3 pg (ref 26.0–34.0)
MCHC: 34.6 g/dL (ref 30.0–36.0)
MCV: 96.4 fL (ref 80.0–100.0)
Platelets: 264 10*3/uL (ref 150–400)
RBC: 4.44 MIL/uL (ref 4.22–5.81)
RDW: 11.2 % — ABNORMAL LOW (ref 11.5–15.5)
WBC: 5.2 10*3/uL (ref 4.0–10.5)
nRBC: 0 % (ref 0.0–0.2)

## 2018-07-06 LAB — BASIC METABOLIC PANEL
Anion gap: 9 (ref 5–15)
BUN: 25 mg/dL — ABNORMAL HIGH (ref 6–20)
CO2: 25 mmol/L (ref 22–32)
Calcium: 9.4 mg/dL (ref 8.9–10.3)
Chloride: 104 mmol/L (ref 98–111)
Creatinine, Ser: 0.86 mg/dL (ref 0.61–1.24)
GFR calc Af Amer: >60 mL/min (ref >60)
GFR calc non Af Amer: >60 mL/min (ref >60)
Glucose, Bld: 98 mg/dL (ref 70–99)
Potassium: 3.9 mmol/L (ref 3.5–5.1)
Sodium: 138 mmol/L (ref 135–145)

## 2018-07-06 MED ORDER — GENTAMICIN SULFATE 40 MG/ML IJ SOLN
5.0000 mg/kg | INTRAVENOUS | Status: AC
  Administered 2018-07-07: 410 mg via INTRAVENOUS
  Filled 2018-07-06: qty 10.25

## 2018-07-07 ENCOUNTER — Ambulatory Visit (HOSPITAL_COMMUNITY): Payer: 59 | Admitting: Physician Assistant

## 2018-07-07 ENCOUNTER — Ambulatory Visit (HOSPITAL_COMMUNITY): Payer: 59 | Admitting: Registered Nurse

## 2018-07-07 ENCOUNTER — Ambulatory Visit (HOSPITAL_COMMUNITY)
Admission: RE | Admit: 2018-07-07 | Discharge: 2018-07-07 | Disposition: A | Discharge status: Home / Self Care | Payer: 59 | Attending: Urology | Admitting: Urology

## 2018-07-07 ENCOUNTER — Encounter (HOSPITAL_COMMUNITY): Payer: Self-pay | Admitting: Emergency Medicine

## 2018-07-07 ENCOUNTER — Encounter (HOSPITAL_COMMUNITY)
Admission: RE | Disposition: A | Discharge status: Home / Self Care | Payer: Self-pay | Source: Home / Self Care | Attending: Urology

## 2018-07-07 ENCOUNTER — Other Ambulatory Visit: Payer: Self-pay

## 2018-07-07 ENCOUNTER — Ambulatory Visit (HOSPITAL_COMMUNITY): Payer: 59

## 2018-07-07 DIAGNOSIS — Z87442 Personal history of urinary calculi: Secondary | ICD-10-CM | POA: Insufficient documentation

## 2018-07-07 DIAGNOSIS — Z6830 Body mass index (BMI) 30.0-30.9, adult: Secondary | ICD-10-CM | POA: Insufficient documentation

## 2018-07-07 DIAGNOSIS — E669 Obesity, unspecified: Secondary | ICD-10-CM | POA: Insufficient documentation

## 2018-07-07 DIAGNOSIS — Z885 Allergy status to narcotic agent status: Secondary | ICD-10-CM | POA: Insufficient documentation

## 2018-07-07 DIAGNOSIS — K219 Gastro-esophageal reflux disease without esophagitis: Secondary | ICD-10-CM | POA: Insufficient documentation

## 2018-07-07 DIAGNOSIS — Z888 Allergy status to other drugs, medicaments and biological substances status: Secondary | ICD-10-CM | POA: Insufficient documentation

## 2018-07-07 DIAGNOSIS — E7201 Cystinuria: Secondary | ICD-10-CM | POA: Diagnosis not present

## 2018-07-07 DIAGNOSIS — N200 Calculus of kidney: Secondary | ICD-10-CM | POA: Insufficient documentation

## 2018-07-07 HISTORY — PX: CYSTOSCOPY WITH RETROGRADE PYELOGRAM, URETEROSCOPY AND STENT PLACEMENT: SHX5789

## 2018-07-07 HISTORY — PX: HOLMIUM LASER APPLICATION: SHX5852

## 2018-07-07 SURGERY — CYSTOURETEROSCOPY, WITH RETROGRADE PYELOGRAM AND STENT INSERTION
Anesthesia: General | Laterality: Bilateral

## 2018-07-07 MED ORDER — PROPOFOL 10 MG/ML IV BOLUS
INTRAVENOUS | Status: AC
  Filled 2018-07-07: qty 20

## 2018-07-07 MED ORDER — DEXAMETHASONE SODIUM PHOSPHATE 10 MG/ML IJ SOLN
INTRAMUSCULAR | Status: DC | PRN
  Administered 2018-07-07: 10 mg via INTRAVENOUS

## 2018-07-07 MED ORDER — HYDROMORPHONE HCL 2 MG PO TABS: 2.0000 mg | ORAL_TABLET | ORAL | 0 refills | Status: DC | PRN

## 2018-07-07 MED ORDER — FENTANYL CITRATE (PF) 100 MCG/2ML IJ SOLN
25.0000 ug | INTRAMUSCULAR | Status: DC | PRN
  Administered 2018-07-07 (×2): 50 ug via INTRAVENOUS

## 2018-07-07 MED ORDER — LIDOCAINE 2% (20 MG/ML) 5 ML SYRINGE
INTRAMUSCULAR | Status: AC
  Filled 2018-07-07: qty 5

## 2018-07-07 MED ORDER — LIDOCAINE 2% (20 MG/ML) 5 ML SYRINGE
INTRAMUSCULAR | Status: DC | PRN
  Administered 2018-07-07: 100 mg via INTRAVENOUS

## 2018-07-07 MED ORDER — PROPOFOL 10 MG/ML IV BOLUS
INTRAVENOUS | Status: DC | PRN
  Administered 2018-07-07: 200 mg via INTRAVENOUS

## 2018-07-07 MED ORDER — ONDANSETRON HCL 4 MG/2ML IJ SOLN
INTRAMUSCULAR | Status: AC
  Filled 2018-07-07: qty 2

## 2018-07-07 MED ORDER — SODIUM CHLORIDE 0.9 % IR SOLN
Status: DC | PRN
  Administered 2018-07-07: 3000 mL

## 2018-07-07 MED ORDER — MIDAZOLAM HCL 2 MG/2ML IJ SOLN
INTRAMUSCULAR | Status: AC
  Filled 2018-07-07: qty 2

## 2018-07-07 MED ORDER — DIPHENHYDRAMINE HCL 50 MG/ML IJ SOLN
12.5000 mg | Freq: Once | INTRAMUSCULAR | Status: AC
  Administered 2018-07-07: 17:00:00 12.5 mg via INTRAVENOUS

## 2018-07-07 MED ORDER — IOHEXOL 300 MG/ML  SOLN
INTRAMUSCULAR | Status: DC | PRN
  Administered 2018-07-07: 37 mL via URETHRAL

## 2018-07-07 MED ORDER — METOCLOPRAMIDE HCL 5 MG/ML IJ SOLN: 10.0000 mg | Freq: Once | INTRAMUSCULAR | Status: DC | PRN

## 2018-07-07 MED ORDER — LACTATED RINGERS IV SOLN: INTRAVENOUS | Status: DC

## 2018-07-07 MED ORDER — ONDANSETRON HCL 4 MG/2ML IJ SOLN
INTRAMUSCULAR | Status: DC | PRN
  Administered 2018-07-07: 4 mg via INTRAVENOUS

## 2018-07-07 MED ORDER — HYDROMORPHONE HCL 2 MG PO TABS
2.0000 mg | ORAL_TABLET | Freq: Once | ORAL | Status: AC
  Administered 2018-07-07: 2 mg via ORAL

## 2018-07-07 MED ORDER — 0.9 % SODIUM CHLORIDE (POUR BTL) OPTIME
TOPICAL | Status: DC | PRN
  Administered 2018-07-07: 1000 mL

## 2018-07-07 MED ORDER — BELLADONNA ALKALOIDS-OPIUM 16.2-30 MG RE SUPP
RECTAL | Status: AC
  Filled 2018-07-07: qty 1

## 2018-07-07 MED ORDER — CEPHALEXIN 500 MG PO CAPS: 500.0000 mg | ORAL_CAPSULE | Freq: Two times a day (BID) | ORAL | 0 refills | Status: DC

## 2018-07-07 MED ORDER — FENTANYL CITRATE (PF) 100 MCG/2ML IJ SOLN
INTRAMUSCULAR | Status: AC
  Filled 2018-07-07: qty 2

## 2018-07-07 MED ORDER — HYDROMORPHONE HCL 2 MG PO TABS
ORAL_TABLET | ORAL | Status: AC
  Filled 2018-07-07: qty 1

## 2018-07-07 MED ORDER — MIDAZOLAM HCL 5 MG/5ML IJ SOLN
INTRAMUSCULAR | Status: DC | PRN
  Administered 2018-07-07: 2 mg via INTRAVENOUS

## 2018-07-07 MED ORDER — MEPERIDINE HCL 50 MG/ML IJ SOLN: 6.2500 mg | INTRAMUSCULAR | Status: DC | PRN

## 2018-07-07 MED ORDER — LIDOCAINE HCL URETHRAL/MUCOSAL 2 % EX GEL
CUTANEOUS | Status: AC
  Filled 2018-07-07: qty 5

## 2018-07-07 MED ORDER — DEXAMETHASONE SODIUM PHOSPHATE 10 MG/ML IJ SOLN
INTRAMUSCULAR | Status: AC
  Filled 2018-07-07: qty 1

## 2018-07-07 MED ORDER — FENTANYL CITRATE (PF) 100 MCG/2ML IJ SOLN
INTRAMUSCULAR | Status: DC | PRN
  Administered 2018-07-07: 25 ug via INTRAVENOUS
  Administered 2018-07-07: 50 ug via INTRAVENOUS
  Administered 2018-07-07: 25 ug via INTRAVENOUS

## 2018-07-07 MED ORDER — DIPHENHYDRAMINE HCL 50 MG/ML IJ SOLN
INTRAMUSCULAR | Status: AC
  Filled 2018-07-07: qty 1

## 2018-07-07 MED ORDER — LACTATED RINGERS IV SOLN
INTRAVENOUS | Status: DC
  Administered 2018-07-07: 14:00:00 via INTRAVENOUS

## 2018-07-07 SURGICAL SUPPLY — 25 items
BAG URO CATCHER STRL LF (MISCELLANEOUS) ×2 IMPLANT
BASKET LASER NITINOL 1.9FR (BASKET) ×1 IMPLANT
BASKET STONE NCOMPASS (UROLOGICAL SUPPLIES) ×1 IMPLANT
BSKT STON RTRVL 120 1.9FR (BASKET) ×1
CATH INTERMIT  6FR 70CM (CATHETERS) ×2 IMPLANT
CLOTH BEACON ORANGE TIMEOUT ST (SAFETY) ×2 IMPLANT
COVER SURGICAL LIGHT HANDLE (MISCELLANEOUS) ×2 IMPLANT
COVER WAND RF STERILE (DRAPES) IMPLANT
EXTRACTOR STONE 1.7FRX115CM (UROLOGICAL SUPPLIES) IMPLANT
FIBER LASER FLEXIVA 1000 (UROLOGICAL SUPPLIES) IMPLANT
FIBER LASER FLEXIVA 365 (UROLOGICAL SUPPLIES) IMPLANT
FIBER LASER FLEXIVA 550 (UROLOGICAL SUPPLIES) IMPLANT
FIBER LASER TRAC TIP (UROLOGICAL SUPPLIES) ×1 IMPLANT
GLOVE BIOGEL M STRL SZ7.5 (GLOVE) ×2 IMPLANT
GOWN STRL REUS W/TWL LRG LVL3 (GOWN DISPOSABLE) ×2 IMPLANT
GUIDEWIRE ANG ZIPWIRE 038X150 (WIRE) ×3 IMPLANT
GUIDEWIRE STR DUAL SENSOR (WIRE) ×3 IMPLANT
KIT TURNOVER KIT A (KITS) IMPLANT
MANIFOLD NEPTUNE II (INSTRUMENTS) ×2 IMPLANT
PACK CYSTO (CUSTOM PROCEDURE TRAY) ×2 IMPLANT
SHEATH URETERAL 12FRX28CM (UROLOGICAL SUPPLIES) IMPLANT
SHEATH URETERAL 12FRX35CM (MISCELLANEOUS) ×1 IMPLANT
STENT POLARIS 5FRX24 (STENTS) ×2 IMPLANT
TUBE FEEDING 8FR 16IN STR KANG (MISCELLANEOUS) ×2 IMPLANT
TUBING CONNECTING 10 (TUBING) ×2 IMPLANT

## 2018-07-07 NOTE — Anesthesia Preprocedure Evaluation (Signed)
Anesthesia Evaluation  Patient identified by MRN, date of birth, ID band Patient awake    Reviewed: Allergy & Precautions, NPO status , Patient's Chart, lab work & pertinent test results  Airway Mallampati: II  TM Distance: >3 FB Neck ROM: Full    Dental no notable dental hx.    Pulmonary neg pulmonary ROS,    Pulmonary exam normal breath sounds clear to auscultation       Cardiovascular negative cardio ROS Normal cardiovascular exam Rhythm:Regular Rate:Normal     Neuro/Psych negative neurological ROS  negative psych ROS   GI/Hepatic Neg liver ROS, GERD  ,  Endo/Other  negative endocrine ROS  Renal/GU negative Renal ROS  negative genitourinary   Musculoskeletal negative musculoskeletal ROS (+)   Abdominal   Peds negative pediatric ROS (+)  Hematology negative hematology ROS (+)   Anesthesia Other Findings   Reproductive/Obstetrics negative OB ROS                             Anesthesia Physical  Anesthesia Plan  ASA: II  Anesthesia Plan: General   Post-op Pain Management:    Induction: Intravenous  PONV Risk Score and Plan: 2 and Ondansetron, Treatment may vary due to age or medical condition and Midazolam  Airway Management Planned: LMA  Additional Equipment:   Intra-op Plan:   Post-operative Plan: Extubation in OR  Informed Consent: I have reviewed the patients History and Physical, chart, labs and discussed the procedure including the risks, benefits and alternatives for the proposed anesthesia with the patient or authorized representative who has indicated his/her understanding and acceptance.     Dental advisory given  Plan Discussed with: CRNA and Surgeon  Anesthesia Plan Comments:         Anesthesia Quick Evaluation

## 2018-07-07 NOTE — H&P (Signed)
Devin BrothersMichael D Sanders is an 38 y.o. male.    Chief Complaint: Pre-OP BILATERAL Ureteroscopic Stone Manipulation  HPI:   1 - Recurrent Nephrolithiasis -  Pre 2019 - PCNL several each side, [facility].  02/2017 - left URS for 1.5cm stone to stone reduction (not stone free) [facility] per [facility] notes  06/2017 - bilateral URS to stone free (Josefa Syracuse)  09/2017 - Rt ureteroscopy / Lt medical passage Berneice Heinrich(Whitaker Holderman / Annabell HowellsWrenn); 10/2017 - bilateral URS to stone free  01/2018 - bilateral URS to stone free; 03/2018 bilateral ureteroscopy to stone free  2 - Cysteinuria - ON thiola 500mg  BID / day PLUS K-Cit 2000MEQ BID meals for cysteinuria.   PMH sig for mild obesity. 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 His PCP is Vernon Preyon Moore MD.   Today " Devin NeedleMichael " is seen to proceed with BILATERAL ureteroscopic stone manipulation. He is having more colic symptoms and this is very reliable indicator of ureteral stones in him. UCX negative 06/2018. Ct <1.    Past Medical History:  Diagnosis Date  . GERD (gastroesophageal reflux disease)   . History of kidney stones   . 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 Wears glasses     Past Surgical History:  Procedure Laterality Date  . CYSTOSCOPY WITH RETROGRADE PYELOGRAM, URETEROSCOPY AND STENT PLACEMENT Bilateral 06/26/2017   Procedure: CYSTOSCOPY WITH RETROGRADE PYELOGRAM, URETEROSCOPY, STONE BASKETRY  AND STENT PLACEMENT;  Surgeon: Sebastian AcheManny, Tationna Fullard, MD;  [facility] [place];  Service: Urology;  Laterality: Bilateral;  . CYSTOSCOPY WITH RETROGRADE PYELOGRAM, URETEROSCOPY AND STENT PLACEMENT Right 09/18/2017   Procedure: CYSTOSCOPY WITH RETROGRADE PYELOGRAM, URETEROSCOPY AND STENT PLACEMENT;  Surgeon: Crist FatHerrick, Benjamin W, MD;  Location: [facility];  Service: Urology;  Laterality: Right;  . CYSTOSCOPY WITH RETROGRADE PYELOGRAM, URETEROSCOPY AND STENT PLACEMENT Bilateral 10/13/2017   Procedure:  CYSTOSCOPY WITH RETROGRADE PYELOGRAM, BILATERAL URETEROSCOPY AND BILATERAL STENT PLACEMENT, LASER;  Surgeon: Sebastian AcheManny, Creedence Kunesh, MD;  Location: [facility];  Service: Urology;  Laterality: Bilateral;  90 MINS  . CYSTOSCOPY WITH RETROGRADE PYELOGRAM, URETEROSCOPY AND STENT PLACEMENT Bilateral 03/24/2018   Procedure: CYSTOSCOPY WITH RETROGRADE PYELOGRAM, URETEROSCOPY AND STENT PLACEMENT;  Surgeon: Sebastian AcheManny, Herby Amick, MD;  Location: [facility];  Service: Urology;  Laterality: Bilateral;  1 HR  . CYSTOSCOPY/RETROGRADE/URETEROSCOPY/STONE EXTRACTION WITH BASKET Left 10/05/2017   Procedure: CYSTOSCOPY/RETROGRADE/STONE REMOVAL FROM BLADDER ;  Surgeon: Bjorn PippinWrenn, John, MD;  Location: [facility];  Service: Urology;  Laterality: Left;  . CYSTOSCOPY/URETEROSCOPY/HOLMIUM LASER/STENT PLACEMENT Right 09/28/2017   Procedure: CYSTOSCOPY/RIGHT RETROGRADE URETEROSCOPY/HOLMIUM LASER/ RIGHT STENT PLACEMENT;  Surgeon: Rene PaciWinter, Christopher Aaron, MD;  Location: [facility];  Service: Urology;  Laterality: Right;  . CYSTOSCOPY/URETEROSCOPY/HOLMIUM LASER/STENT PLACEMENT Bilateral 01/27/2018   Procedure: CYSTOSCOPY/URETEROSCOPY/HOLMIUM LASER/STENT PLACEMENT;  Surgeon: Sebastian AcheManny, Alonzo Owczarzak, MD;  [facility] [place];  Service: Urology;  Laterality: Bilateral;  . HOLMIUM LASER APPLICATION Bilateral 06/26/2017   Procedure: HOLMIUM LASER APPLICATION;  Surgeon: Sebastian AcheManny, Ransom Nickson, MD;  [facility] [place];  Service: Urology;  Laterality: Bilateral;  . HOLMIUM LASER APPLICATION Right 09/18/2017   Procedure: HOLMIUM LASER APPLICATION;  Surgeon: Crist FatHerrick, Benjamin W, MD;  Location[facility];  Service: Urology;  Laterality: Right;  . HOLMIUM LASER APPLICATION Bilateral 10/13/2017   Procedure: HOLMIUM LASER APPLICATION;  Surgeon: Sebastian AcheManny, Brice Kossman, MD;  Location[facility];  Service: Urology;  Laterality: Bilateral;  . HOLMIUM LASER APPLICATION Bilateral 01/27/2018   Procedure: HOLMIUM LASER APPLICATION;  Surgeon: Berneice HeinrichManny,  Hubbard Robinson, MD;  Location: [facility];  Service: Urology;  Laterality: Bilateral;  . HOLMIUM LASER APPLICATION Bilateral 9/32/6712   Procedure: HOLMIUM LASER APPLICATION;  Surgeon: Alexis Frock, MD;  Location: [facility];  Service: Urology;  Laterality: Bilateral;  . PERCUTANEOUS NEPHROSTOLITHOTOMY  2003;  2009;  06-22-2014  @ [facility]  . URETEROSCOPIC STONE MANIPULATION UNILATERAL  multiple since 1997--  last one 02-19-2017  @ [facility] by dr [email or web address]    No family history on file. Social History:  reports that he has never smoked. He has never used smokeless tobacco. He reports that he does not drink alcohol or use drugs.  Allergies:  Allergies  Allergen Reactions  . Morphine Nausea And Vomiting  . Other Hives and Swelling    Mangos    No medications prior to admission.    Results for orders placed or performed during the hospital encounter of 07/06/18 (from the past 48 hour(s))  Basic metabolic panel     Status: Abnormal   Collection Time: 07/06/18  8:35 AM  Result Value Ref Range   Sodium 138 135 - 145 mmol/L   Potassium 3.9 3.5 - 5.1 mmol/L   Chloride 104 98 - 111 mmol/L   CO2 25 22 - 32 mmol/L   Glucose, Bld 98 70 - 99 mg/dL   BUN 25 (H) 6 - 20 mg/dL   Creatinine, Ser 0.86 0.61 - 1.24 mg/dL   Calcium 9.4 8.9 - 10.3 mg/dL   GFR calc non Af Amer >60 >60 mL/min   GFR calc Af Amer >60 >60 mL/min   Anion gap 9 5 - 15    Comment: Performed at [facility], [place] [address], [place], [place] [address]  CBC     Status: Abnormal   Collection Time: 07/06/18  8:35 AM  Result Value Ref Range   WBC 5.2 4.0 - 10.5 K/uL   RBC 4.44 4.22 - 5.81 MIL/uL   Hemoglobin 14.8 13.0 - 17.0 g/dL   HCT 42.8 39.0 - 52.0 %   MCV 96.4 80.0 - 100.0 fL   MCH 33.3 26.0 - 34.0 pg   MCHC 34.6 30.0 - 36.0 g/dL   RDW 11.2 (L) 11.5 - 15.5 %   Platelets 264 150 - 400 K/uL   nRBC 0.0 0.0 - 0.2 %    Comment: Performed at [facility], [place] [address] [place],  [address]   No  results found.  Review of Systems  Constitutional: Negative for chills and fever.  Genitourinary: Positive for flank pain.  All other systems reviewed and are negative.   There were no vitals taken for this visit. Physical Exam  Constitutional: He appears well-developed.  HENT:  Head: Normocephalic.  Eyes: Pupils are equal, round, and reactive to light.  Neck: Normal range of motion.  Cardiovascular: Normal rate.  Respiratory: Effort normal.  GI: Soft.  Stable truncal obesity.   Genitourinary:    Genitourinary Comments: L>R CVAT that is mild at present.    Neurological: He is alert.  Skin: Skin is warm.  Psychiatric: He has a normal mood and affect.     Assessment/Plan  Proceed as planned with BILATERAL Ureteroscopic stone manipulation with goal of stone free. Risks, benefits, alternatives, expected peri-op course discussed previously and reiterated today.   Alexis Frock, MD 07/07/2018, 6:49 AM

## 2018-07-07 NOTE — Discharge Instructions (Signed)
1 - You may have urinary urgency (bladder spasms) and bloody urine on / off with stent in place. This is normal.  2 - Removed tethered stents on Friday morning at home by pulling on strings, then blue-white plastic tubing, and discarding. There are TWO stents.   3 - Call MD or go to ER for fever >102, severe pain / nausea / vomiting not relieved by medications, or acute change in medical status

## 2018-07-07 NOTE — Anesthesia Procedure Notes (Signed)
Procedure Name: LMA Insertion Date/Time: 07/07/2018 3:28 PM Performed by: Talbot Grumbling, CRNA Pre-anesthesia Checklist: Patient identified, Emergency Drugs available, Suction available and Patient being monitored Patient Re-evaluated:Patient Re-evaluated prior to induction Oxygen Delivery Method: Circle system utilized Preoxygenation: Pre-oxygenation with 100% oxygen Induction Type: IV induction Ventilation: Mask ventilation without difficulty LMA: LMA inserted LMA Size: 4.0 Number of attempts: 1 Placement Confirmation: positive ETCO2 and breath sounds checked- equal and bilateral Dental Injury: Teeth and Oropharynx as per pre-operative assessment

## 2018-07-07 NOTE — Anesthesia Postprocedure Evaluation (Signed)
Anesthesia Post Note  Patient: Devin Sanders  Procedure(s) Performed: CYSTOSCOPY WITH RETROGRADE PYELOGRAM, URETEROSCOPY AND STENT PLACEMENT (Bilateral ) HOLMIUM LASER APPLICATION (Bilateral )     Anesthesia Type: General    Last Vitals:  Vitals:   07/07/18 1645 07/07/18 1700  BP: 120/87 120/88  Pulse: 74 74  Resp: 15 15  Temp: 36.7 C (!) 35.9 C  SpO2: 100% 100%    Last Pain:  Vitals:   07/07/18 1700  TempSrc:   PainSc: 4                  Lidia Collum

## 2018-07-07 NOTE — Transfer of Care (Signed)
Immediate Anesthesia Transfer of Care Note  Patient: Devin Sanders  Procedure(s) Performed: CYSTOSCOPY WITH RETROGRADE PYELOGRAM, URETEROSCOPY AND STENT PLACEMENT (Bilateral ) HOLMIUM LASER APPLICATION (Bilateral )  Patient Location: PACU  Anesthesia Type:General  Level of Consciousness: sedated  Airway & Oxygen Therapy: Patient Spontanous Breathing and Patient connected to face mask oxygen  Post-op Assessment: Report given to RN and Post -op Vital signs reviewed and stable  Post vital signs: Reviewed and stable  Last Vitals:  Vitals Value Taken Time  BP    Temp    Pulse    Resp    SpO2      Last Pain:  Vitals:   07/07/18 1411  TempSrc:   PainSc: 7       Patients Stated Pain Goal: 4 (03/26/20 4825)  Complications: No apparent anesthesia complications

## 2018-07-07 NOTE — Brief Op Note (Signed)
07/07/2018  4:17 PM  PATIENT:  Devin Sanders  38 y.o. male  PRE-OPERATIVE DIAGNOSIS:  RENAL CALCULI, CYSTEINURIA  POST-OPERATIVE DIAGNOSIS:  RENAL CALCULI, CYSTEINURIA  PROCEDURE:  Procedure(s) with comments: CYSTOSCOPY WITH RETROGRADE PYELOGRAM, URETEROSCOPY AND STENT PLACEMENT (Bilateral) - 75 MINUTES HOLMIUM LASER APPLICATION (Bilateral)  SURGEON:  Surgeon(s) and Role:    Alexis Frock, MD - Primary  PHYSICIAN ASSISTANT:   ASSISTANTS: none   ANESTHESIA:   general  EBL:  0 mL   BLOOD ADMINISTERED:none  DRAINS: none   LOCAL MEDICATIONS USED:  NONE  SPECIMEN:  Source of Specimen:  L>R renal stone fragments  DISPOSITION OF SPECIMEN:  Given to patient  COUNTS:  YES  TOURNIQUET:  * No tourniquets in log *  DICTATION: .Other Dictation: Dictation Number [id number]  PLAN OF CARE: Discharge to home after PACU  PATIENT DISPOSITION:  PACU - hemodynamically stable.   Delay start of Pharmacological VTE agent (>24hrs) due to surgical blood loss or risk of bleeding: yes

## 2018-07-08 ENCOUNTER — Encounter (HOSPITAL_COMMUNITY): Payer: Self-pay | Admitting: Urology

## 2018-07-08 DIAGNOSIS — R1012 Left upper quadrant pain: Secondary | ICD-10-CM | POA: Diagnosis not present

## 2018-07-08 DIAGNOSIS — Z79899 Other long term (current) drug therapy: Secondary | ICD-10-CM | POA: Insufficient documentation

## 2018-07-08 DIAGNOSIS — R112 Nausea with vomiting, unspecified: Secondary | ICD-10-CM | POA: Insufficient documentation

## 2018-07-08 DIAGNOSIS — N1339 Other hydronephrosis: Secondary | ICD-10-CM | POA: Diagnosis not present

## 2018-07-08 DIAGNOSIS — R1011 Right upper quadrant pain: Secondary | ICD-10-CM | POA: Diagnosis present

## 2018-07-08 NOTE — Op Note (Signed)
NAME: Devin Sanders, BOSTIC MEDICAL RECORD ZO:[id number] ACCOUNT [id number] DATE OF BIRTH:Jun 01, 1980 FACILITY: [facility] LOCATION: [facility] PHYSICIAN:Harmoni Lucus, MD  OPERATIVE REPORT  DATE OF PROCEDURE:  07/07/2018  PREOPERATIVE DIAGNOSIS:  Cystinuria with recurrent stones, left flank pain.  PROCEDURE: 1.  Cystoscopy with bilateral retrograde pyelograms, interpretation. 2.  Bilateral ureteroscopy, laser lithotripsy. 3.  Insertion of bilateral ureteral stents 5 x 24 Polaris with tether.  ESTIMATED BLOOD LOSS:  Nil.  COMPLICATIONS:  None.  SPECIMENS:  Left greater than right renal stone fragments given to patient.  FINDINGS:   1.  Approximately 1.3 cm volume left renal stone with likely ball-valving intermittent obstruction. 2.  Small volume right papillary tip calcifications. 3.  Complete resolution of all accessible stone fragments larger than one-third mm following laser lithotripsy and basket extraction. 4.  Successful placement of bilateral ureteral stents proximal in the renal pelvis, distal end in urinary bladder.  INDICATIONS:  The patient is a very pleasant but unfortunate 38 year old man with history of cystinuria.  He is very medically compliant, however, despite this, he forms symptomatic urinary calculi quite frequently.  He has been on a protocol of  approximately q.3-4 month bilateral ureteroscopy to prevent progression to major surgery.  He has done well with this.  He was approximately 3 months post his most recent ureteroscopy, and he has had progressive left flank pain sometimes requiring  narcotic pain medications.  Options were discussed including recommended path of bilateral ureteroscopic stone ablation and he wished to proceed.  Informed consent was then placed in medical record.  DESCRIPTION OF PROCEDURE:  The patient was identified.  The procedure being bilateral ureteroscopic stimulation was confirmed.  Procedure timeout was performed.  Antibiotics  administered.  General anesthesia induced.  The patient was placed into a low  lithotomy position, sterile field was created prepped and draped the patient's penis, perineum and proximal thighs using iodine.  Cystourethroscopy was performed with a 22-French rigid cystoscope with offset lens.  Both anterior and posterior urethra  were unremarkable.  Inspection of bladder revealed a small crystal area consistent with his known cystinuria.  The left ureter was cannulated with a sequential catheter and left retrograde pyelogram was obtained.  Left retrograde pyelogram shows a single left ureter singleton left kidney.  There was a filling defect in the renal pelvis.  This was mobile consistent with the suspected stone.  There was minimal hydronephrosis.  A 0.038 ZIPwire was then delivered to  the pole and set aside as a safety wire.  Next, right pyelogram was obtained.  Right retrograde pyelogram demonstrated a single right ureter single system right kidney.  No filling defects or narrowing noted.  A separate ZIPwire was advanced to lower pole and set aside as a safety wire.  An 8-French feeding tube was placed in the  urinary bladder for pressure release.  Semirigid ureteroscopy was performed of the distal 4/5 right ureter alongside a separate sensor working wire and no mucosal abnormalities were found.  Similarly, a semirigid ureteroscopy was performed of the distal  orifice left ureter alongside a sensor working wire and mucosal abnormalities were found.  The semirigid scope was exchanged for a 12/14 medium length ureteral access sheath to the level of the proximal ureter using continuous fluoroscopic guidance and  flexible digital ureteroscopy performed the proximal left ureter and left kidney.  Inspection of the left kidney revealed a mobile likely intermittent ball-valving stone approximately 1.3 cm much too large for simple basketing.  It was retrograde  positioned into the  upper pole infundibulum and  holmium laser energy applied 70 setting of 0.2 joules and 20 Hz, and fragmented into smaller pieces approximately 1-2 mm, which were then amenable to basketing with an escape basket navigated inside and set  aside to be given to the patient.  There were residual small fragments inherently generated that were basketed using an NCompass type basket.  Following this, there was complete resolution of all accessible stone fragments larger than one-third mm.   Excellent hemostasis.  There was renal perforation.  The access sheath was removed under continuous vision, no mucosal abnormalities were found.  Next, the access sheath was placed up the right ureter along the separate sensor working wire to the level  of the proximal ureter using continuous fluoroscopic guidance and flexible digital ureteroscopy performed in the proximal ureter and systematic inspection of the right kidney, including all calices x3.  There were small volume intrarenal stones or  papillary tip calcifications.  Papillary tip calcifications were ablated using holmium laser energy at a setting of 1.5 joules and 20 Hz, and all accessible stone fragments larger than one-third mm were grasped with the NCompass basket and removed and  set aside to be given to the patient.  The access sheath was removed under continuous vision, no mucosal abnormalities were found.  Given the bilateral nature of the procedure, it was felt that brief interval stenting with tethered stents would be  warranted.  As such, a new 5 x 24 Polaris-type stent was placed remaining safety wire using fluoroscopic guidance.  Good proximal and distal plane were noted.  Tethers were left in place and fashioned to the dorsum of the penis.  Procedure terminated.   The patient tolerated the procedure well.  No immediate perioperative complications.  The patient was taken to postanesthesia care in stable condition.  Plan is for discharge home.  TN/NUANCE  D:07/07/2018 T:07/08/2018  JOB:[id number]

## 2018-07-09 ENCOUNTER — Emergency Department (HOSPITAL_COMMUNITY): Payer: 59

## 2018-07-09 ENCOUNTER — Other Ambulatory Visit: Payer: Self-pay

## 2018-07-09 ENCOUNTER — Emergency Department (HOSPITAL_COMMUNITY)
Admission: EM | Admit: 2018-07-09 | Discharge: 2018-07-09 | Disposition: A | Discharge status: Home / Self Care | Payer: 59 | Attending: Emergency Medicine | Admitting: Emergency Medicine

## 2018-07-09 ENCOUNTER — Encounter (HOSPITAL_COMMUNITY): Payer: Self-pay | Admitting: Emergency Medicine

## 2018-07-09 DIAGNOSIS — R112 Nausea with vomiting, unspecified: Secondary | ICD-10-CM

## 2018-07-09 DIAGNOSIS — N1339 Other hydronephrosis: Secondary | ICD-10-CM

## 2018-07-09 DIAGNOSIS — R109 Unspecified abdominal pain: Secondary | ICD-10-CM

## 2018-07-09 LAB — CBC WITH DIFFERENTIAL/PLATELET
Abs Immature Granulocytes: 0.05 10*3/uL (ref 0.00–0.07)
Basophils Absolute: 0 10*3/uL (ref 0.0–0.1)
Basophils Relative: 0 %
Eosinophils Absolute: 0 10*3/uL (ref 0.0–0.5)
Eosinophils Relative: 0 %
HCT: 41.4 % (ref 39.0–52.0)
Hemoglobin: 14 g/dL (ref 13.0–17.0)
Immature Granulocytes: 0 %
Lymphocytes Relative: 12 %
Lymphs Abs: 1.5 10*3/uL (ref 0.7–4.0)
MCH: 32.9 pg (ref 26.0–34.0)
MCHC: 33.8 g/dL (ref 30.0–36.0)
MCV: 97.2 fL (ref 80.0–100.0)
Monocytes Absolute: 0.8 10*3/uL (ref 0.1–1.0)
Monocytes Relative: 7 %
Neutro Abs: 10 10*3/uL — ABNORMAL HIGH (ref 1.7–7.7)
Neutrophils Relative %: 81 %
Platelets: 256 10*3/uL (ref 150–400)
RBC: 4.26 MIL/uL (ref 4.22–5.81)
RDW: 11.2 % — ABNORMAL LOW (ref 11.5–15.5)
WBC: 12.4 10*3/uL — ABNORMAL HIGH (ref 4.0–10.5)
nRBC: 0 % (ref 0.0–0.2)

## 2018-07-09 LAB — BASIC METABOLIC PANEL
Anion gap: 9 (ref 5–15)
BUN: 24 mg/dL — ABNORMAL HIGH (ref 6–20)
CO2: 25 mmol/L (ref 22–32)
Calcium: 9.1 mg/dL (ref 8.9–10.3)
Chloride: 104 mmol/L (ref 98–111)
Creatinine, Ser: 1.25 mg/dL — ABNORMAL HIGH (ref 0.61–1.24)
GFR calc Af Amer: >60 mL/min (ref >60)
GFR calc non Af Amer: >60 mL/min (ref >60)
Glucose, Bld: 128 mg/dL — ABNORMAL HIGH (ref 70–99)
Potassium: 4.1 mmol/L (ref 3.5–5.1)
Sodium: 138 mmol/L (ref 135–145)

## 2018-07-09 MED ORDER — HYDROMORPHONE HCL 2 MG PO TABS: 2.0000 mg | ORAL_TABLET | ORAL | 0 refills | Status: DC | PRN

## 2018-07-09 MED ORDER — ONDANSETRON HCL 4 MG/2ML IJ SOLN
4.0000 mg | Freq: Once | INTRAMUSCULAR | Status: AC
  Administered 2018-07-09: 4 mg via INTRAVENOUS
  Filled 2018-07-09: qty 2

## 2018-07-09 MED ORDER — HYDROMORPHONE HCL 1 MG/ML IJ SOLN
1.0000 mg | Freq: Once | INTRAMUSCULAR | Status: AC
  Administered 2018-07-09: 1 mg via INTRAVENOUS
  Filled 2018-07-09: qty 1

## 2018-07-09 MED ORDER — SODIUM CHLORIDE 0.9 % IV BOLUS
1000.0000 mL | Freq: Once | INTRAVENOUS | Status: AC
  Administered 2018-07-09: 1000 mL via INTRAVENOUS

## 2018-07-09 MED ORDER — ONDANSETRON 8 MG PO TBDP: 8.0000 mg | ORAL_TABLET | Freq: Three times a day (TID) | ORAL | 0 refills | Status: DC | PRN

## 2018-07-09 NOTE — ED Triage Notes (Signed)
Pt reports having urinary stents placed yesterday and now is unable to urinate. Pt vomiting at time of triage.

## 2018-07-09 NOTE — ED Provider Notes (Signed)
[facility]-EMERGENCY DEPT Provider Note   CSN: [id number] Arrival date & time: 07/08/18  2353    History   Chief Complaint Chief Complaint  Patient presents with   Urinary Retention    HPI Devin Sanders is a 38 y.o. male.   The history is provided by the patient.  He has history of cystinuria with frequent urinary calculi and comes in with bilateral flank pain and vomiting.  Yesterday, he had cystoscopy with removal of ureteral and renal calculi and placement of bilateral stents.  This evening, he felt like he was unable to urinate and removed his stents.  Following that, he was able to urinate.  However, bilateral flank pain which had been present prior to his removing the stents became much more severe.  Pain is worse on the left.  There is associated nausea and vomiting.  He rates pain a 10/10.  Nothing makes it better, nothing makes it worse.  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   Wears glasses     Patient Active Problem List   Diagnosis Date Noted   Hydronephrosis of right kidney 09/28/2017    Past Surgical History:  Procedure Laterality Date   CYSTOSCOPY WITH RETROGRADE PYELOGRAM, URETEROSCOPY AND STENT PLACEMENT Bilateral 06/26/2017   Procedure: CYSTOSCOPY WITH RETROGRADE PYELOGRAM, URETEROSCOPY, STONE BASKETRY  AND STENT PLACEMENT;  Surgeon: Sebastian AcheManny, Theodore, MD;  [facility] [place];  Service: Urology;  Laterality: Bilateral;   CYSTOSCOPY WITH RETROGRADE PYELOGRAM, URETEROSCOPY AND STENT PLACEMENT Right 09/18/2017   Procedure: CYSTOSCOPY WITH RETROGRADE PYELOGRAM, URETEROSCOPY AND STENT PLACEMENT;  Surgeon: Crist FatHerrick, Benjamin W, MD;  Location: [facility];  Service: Urology;  Laterality: Right;   CYSTOSCOPY WITH RETROGRADE PYELOGRAM, URETEROSCOPY AND STENT PLACEMENT Bilateral 10/13/2017   Procedure: CYSTOSCOPY WITH RETROGRADE PYELOGRAM, BILATERAL URETEROSCOPY AND BILATERAL STENT PLACEMENT, LASER;  Surgeon: Sebastian AcheManny, Theodore, MD;  Location: [facility];  Service: Urology;  Laterality: Bilateral;  90 MINS   CYSTOSCOPY WITH RETROGRADE PYELOGRAM, URETEROSCOPY AND STENT PLACEMENT Bilateral 03/24/2018   Procedure: CYSTOSCOPY WITH RETROGRADE PYELOGRAM, URETEROSCOPY AND STENT PLACEMENT;  Surgeon: Sebastian AcheManny, Theodore, MD;  Location: [facility];  Service: Urology;  Laterality: Bilateral;  1 HR   CYSTOSCOPY WITH RETROGRADE PYELOGRAM, URETEROSCOPY AND STENT PLACEMENT Bilateral 07/07/2018   Procedure: CYSTOSCOPY WITH RETROGRADE PYELOGRAM, URETEROSCOPY AND STENT PLACEMENT;  Surgeon: Sebastian AcheManny, Theodore, MD;  Location: [facility];  Service: Urology;  Laterality: Bilateral;  75 MINUTES   CYSTOSCOPY/RETROGRADE/URETEROSCOPY/STONE EXTRACTION WITH BASKET Left 10/05/2017   Procedure: CYSTOSCOPY/RETROGRADE/STONE REMOVAL FROM BLADDER ;  Surgeon: Bjorn PippinWrenn, John, MD;  Location: [facility];  Service: Urology;  Laterality: Left;   CYSTOSCOPY/URETEROSCOPY/HOLMIUM LASER/STENT PLACEMENT Right 09/28/2017   Procedure: CYSTOSCOPY/RIGHT RETROGRADE URETEROSCOPY/HOLMIUM LASER/ RIGHT STENT PLACEMENT;  Surgeon: Rene PaciWinter, Christopher Aaron, MD;  Location: [facility];  Service: Urology;  Laterality: Right;   CYSTOSCOPY/URETEROSCOPY/HOLMIUM LASER/STENT PLACEMENT Bilateral 01/27/2018   Procedure: CYSTOSCOPY/URETEROSCOPY/HOLMIUM LASER/STENT PLACEMENT;  Surgeon: Sebastian AcheManny, Theodore, MD;  [facility] [place];  Service: Urology;  Laterality: Bilateral;   HOLMIUM LASER APPLICATION Bilateral 06/26/2017   Procedure: HOLMIUM LASER APPLICATION;  Surgeon: Sebastian AcheManny, Theodore, MD;  [facility] [place];  Service: Urology;  Laterality: Bilateral;   HOLMIUM LASER APPLICATION Right 09/18/2017   Procedure: HOLMIUM LASER APPLICATION;  Surgeon: Crist FatHerrick, Benjamin W, MD;  Location: [facility];  Service: Urology;  Laterality: Right;   HOLMIUM LASER APPLICATION  Bilateral 10/13/2017   Procedure: HOLMIUM LASER APPLICATION;  Surgeon: Alexis Frock, MD;  Location: [facility];  Service: Urology;  Laterality: Bilateral;   HOLMIUM LASER APPLICATION Bilateral 5/64/3329   Procedure: HOLMIUM LASER APPLICATION;  Surgeon: Alexis Frock, MD;  Location: [facility];  Service: Urology;  Laterality: Bilateral;   HOLMIUM LASER APPLICATION Bilateral 05/24/8414   Procedure: HOLMIUM LASER APPLICATION;  Surgeon: Alexis Frock, MD;  Location: [facility];  Service: Urology;  Laterality: Bilateral;   HOLMIUM LASER APPLICATION Bilateral 6/0/6301   Procedure: HOLMIUM LASER APPLICATION;  Surgeon: Alexis Frock, MD;  Location: [facility];  Service: Urology;  Laterality: Bilateral;   PERCUTANEOUS NEPHROSTOLITHOTOMY  2003;  2009;  06-22-2014  @ [facility]   URETEROSCOPIC STONE MANIPULATION UNILATERAL  multiple since 1997--  last one 02-19-2017  @ [facility] by dr [email or web address]        Home Medications    Prior to Admission medications   Medication Sig Start Date End Date Taking? Authorizing Provider  cephALEXin (KEFLEX) 500 MG capsule Take 1 capsule (500 mg total) by mouth 2 (two) times daily. X 3 days to prevent infection with stents in place. 07/07/18   Alexis Frock, MD  HYDROmorphone (DILAUDID) 2 MG tablet Take 1 tablet (2 mg total) by mouth every 4 (four) hours as needed for severe pain. 07/07/18   Alexis Frock, MD  ondansetron (ZOFRAN) 4 MG tablet Take 1 tablet (4 mg total) by mouth every 6 (six) hours. Patient taking differently: Take 4 mg by mouth every 6 (six) hours as needed for nausea or vomiting.  05/26/18   Henderly, Britni A, PA-C  potassium chloride SA (K-DUR) 20 MEQ tablet Take 20 mEq by mouth 2 (two) times daily.    [provider]  tiopronin (THIOLA) 100 MG tablet Take 200 mg by mouth 3 (three) times daily.  07/23/16   [provider]    Family History History reviewed. No pertinent family history.  Social History Social History    Tobacco Use   Smoking status: Never Smoker   Smokeless tobacco: Never Used  Substance Use Topics   Alcohol use: Never    Frequency: Never   Drug use: Never     Allergies   Morphine and Other   Review of Systems Review of Systems  All other systems reviewed and are negative.    Physical Exam Updated Vital Signs BP (!) 153/105 (BP Location: Right Arm)    Pulse 78    Temp 98 F (36.7 C) (Oral)    Resp 17    Ht 5\' 10"  (1.778 m)    Wt 97.5 kg    SpO2 97%    BMI 30.84 kg/m   Physical Exam Vitals signs and nursing note reviewed.    38 year old male, in obvious pain and actively vomiting, but in no acute distress. Vital signs are significant for elevated blood pressure. Oxygen saturation is 97%, which is normal. Head is normocephalic and atraumatic. PERRLA, EOMI. Oropharynx is clear. Neck is nontender and supple without adenopathy or JVD. Back is nontender in the midline.  There is moderate bilateral CVA tenderness. Lungs are clear without rales, wheezes, or rhonchi. Chest is nontender. Heart has regular rate and rhythm without murmur. Abdomen is soft, flat, with mild to moderate left lower quadrant tenderness.  There is no rebound or guarding.  There are no masses or hepatosplenomegaly and peristalsis is hypoactive. Extremities have no cyanosis or edema, full range of motion is present. Skin is warm and dry without  rash. Neurologic: Mental status is normal, cranial nerves are intact, there are no motor or sensory deficits.  ED Treatments / Results  Labs (all labs ordered are listed, but only abnormal results are displayed) Labs Reviewed  CBC WITH DIFFERENTIAL/PLATELET - Abnormal; Notable for the following components:      Result Value   WBC 12.4 (*)    RDW 11.2 (*)    Neutro Abs 10.0 (*)    All other components within normal limits  BASIC METABOLIC PANEL - Abnormal; Notable for the following components:   Glucose, Bld 128 (*)    BUN 24 (*)    Creatinine, Ser  1.25 (*)    All other components within normal limits  URINALYSIS, ROUTINE W REFLEX MICROSCOPIC   Radiology Dg C-arm 1-60 Min-no Report  Result Date: 07/07/2018 Fluoroscopy was utilized by the requesting physician.  No radiographic interpretation.   Ct Renal Stone Study  Result Date: 07/09/2018 CLINICAL DATA:  Flank pain EXAM: CT ABDOMEN AND PELVIS WITHOUT CONTRAST TECHNIQUE: Multidetector CT imaging of the abdomen and pelvis was performed following the standard protocol without IV contrast. COMPARISON:  CT 05/26/2018, fluoroscopic images 7 01/2018 FINDINGS: Lower chest: Lung bases demonstrate mild atelectasis in the lingula and left base. No pleural effusion. The heart size is normal. Hepatobiliary: No focal liver abnormality is seen. No gallstones, gallbladder wall thickening, or biliary dilatation. Pancreas: Unremarkable. No pancreatic ductal dilatation or surrounding inflammatory changes. Spleen: Normal in size without focal abnormality. Adrenals/Urinary Tract: Adrenal glands are normal. Faint 4 mm stone lower pole right kidney. Multiple punctate stones in the upper pole of left kidney. 3 mm stone mid pole left kidney and lower pole left kidney. Moderate bilateral hydronephrosis and hydroureter with Peri ureteral soft tissue stranding. Probable punctate stone fragment within the proximal to mid left ureter. Probable scarring in the mid to lower left kidney. The previously noted ureteral stents are not identified. The bladder is unremarkable. Stomach/Bowel: Stomach is within normal limits. Appendix appears normal. No evidence of bowel wall thickening, distention, or inflammatory changes. Vascular/Lymphatic: No significant vascular findings are present. No enlarged abdominal or pelvic lymph nodes. Reproductive: Negative for mass lesion Other: Negative for free air or free fluid. Small fat within the inguinal canal Musculoskeletal: No acute or significant osseous findings. IMPRESSION: 1. Moderate bilateral  hydronephrosis and bilateral hydroureter. The previously noted ureteral stents on fluoroscopic images from 07/07/2018 are not identified. Probable punctate stone fragment within the proximal to mid left ureter. No additional stones visible along the course of the ureters. 2. Left greater than right intrarenal calculi. Electronically Signed   By: Jasmine PangKim  Fujinaga M.D.   On: 07/09/2018 02:04    Procedures Procedures   Medications Ordered in ED Medications  sodium chloride 0.9 % bolus 1,000 mL (1,000 mLs Intravenous New Bag/Given 07/09/18 0054)  HYDROmorphone (DILAUDID) injection 1 mg (1 mg Intravenous Given 07/09/18 0054)  ondansetron (ZOFRAN) injection 4 mg (4 mg Intravenous Given 07/09/18 0054)  HYDROmorphone (DILAUDID) injection 1 mg (1 mg Intravenous Given 07/09/18 0153)     Initial Impression / Assessment and Plan / ED Course  I have reviewed the triage vital signs and the nursing notes.  Pertinent labs & imaging results that were available during my care of the patient were reviewed by me and considered in my medical decision making (see chart for details).  Ureteral colic following removal of ureteral stents.  Old records are reviewed confirming cystoscopy and bilateral stent placement done yesterday.  He will be given IV  fluids, ondansetron, hydromorphone and will be sent for renal stone protocol CT scan.  CT scan shows bilateral hydronephrosis without any obvious obstruction.  Labs are unremarkable.  He got good relief of pain and nausea with above-noted medication, but pain did recur requiring a second dose of hydromorphone.  Case was discussed with Dr. Ronne BinningMcKenzie, on-call for urology, who feels that if pain can be controlled he can go home.  He is discharged with prescriptions for ondansetron ODT and a small number of hydromorphone tablets.  Follow-up with his urologist, return precautions discussed.  Final Clinical Impressions(s) / ED Diagnoses   Final diagnoses:  Other hydronephrosis    Bilateral flank pain  Non-intractable vomiting with nausea, unspecified vomiting type    ED Discharge Orders         Ordered    ondansetron (ZOFRAN-ODT) 8 MG disintegrating tablet  Every 8 hours PRN     07/09/18 0318    HYDROmorphone (DILAUDID) 2 MG tablet  Every 4 hours PRN     07/09/18 0318           Dione BoozeGlick, Katonya Blecher, MD 07/09/18 0321

## 2018-07-09 NOTE — Discharge Instructions (Addendum)
Take the hydromorphone as needed for pain, ondansetron for nausea.  Return if pain is not being adequately controlled at home, or if you start running a fever.  Keep your follow-up appointment with Dr. Tresa Moore.

## 2018-07-09 NOTE — ED Notes (Signed)
Pt unable to give urine sample at this time 

## 2018-09-23 ENCOUNTER — Emergency Department (HOSPITAL_COMMUNITY): Payer: 59

## 2018-09-23 ENCOUNTER — Encounter (HOSPITAL_COMMUNITY): Payer: Self-pay | Admitting: Emergency Medicine

## 2018-09-23 ENCOUNTER — Emergency Department (HOSPITAL_COMMUNITY)
Admission: EM | Admit: 2018-09-23 | Discharge: 2018-09-23 | Disposition: A | Discharge status: Home / Self Care | Payer: 59 | Attending: Emergency Medicine | Admitting: Emergency Medicine

## 2018-09-23 ENCOUNTER — Other Ambulatory Visit: Payer: Self-pay

## 2018-09-23 DIAGNOSIS — N200 Calculus of kidney: Secondary | ICD-10-CM | POA: Diagnosis not present

## 2018-09-23 DIAGNOSIS — R109 Unspecified abdominal pain: Secondary | ICD-10-CM | POA: Diagnosis present

## 2018-09-23 LAB — URINALYSIS, ROUTINE W REFLEX MICROSCOPIC
Bilirubin Urine: NEGATIVE
Glucose, UA: NEGATIVE mg/dL
Hgb urine dipstick: NEGATIVE
Ketones, ur: NEGATIVE mg/dL
Leukocytes,Ua: NEGATIVE
Nitrite: NEGATIVE
Protein, ur: NEGATIVE mg/dL
Specific Gravity, Urine: 1.012 (ref 1.005–1.030)
pH: 7 (ref 5.0–8.0)

## 2018-09-23 MED ORDER — HYDROCODONE-ACETAMINOPHEN 5-325 MG PO TABS: 1.0000 | ORAL_TABLET | Freq: Four times a day (QID) | ORAL | 0 refills | Status: DC | PRN

## 2018-09-23 MED ORDER — HYDROCODONE-ACETAMINOPHEN 5-325 MG PO TABS
1.0000 | ORAL_TABLET | Freq: Once | ORAL | Status: AC
  Administered 2018-09-23: 1 via ORAL
  Filled 2018-09-23: qty 1

## 2018-09-23 MED ORDER — KETOROLAC TROMETHAMINE 15 MG/ML IJ SOLN
15.0000 mg | Freq: Once | INTRAMUSCULAR | Status: AC
  Administered 2018-09-23: 15 mg via INTRAMUSCULAR
  Filled 2018-09-23: qty 1

## 2018-09-23 MED ORDER — ONDANSETRON 4 MG PO TBDP
4.0000 mg | ORAL_TABLET | Freq: Once | ORAL | Status: AC
  Administered 2018-09-23: 4 mg via ORAL
  Filled 2018-09-23: qty 1

## 2018-09-23 MED ORDER — ONDANSETRON 4 MG PO TBDP: 4.0000 mg | ORAL_TABLET | Freq: Three times a day (TID) | ORAL | 0 refills | Status: DC | PRN

## 2018-09-23 NOTE — Discharge Instructions (Signed)
Follow-up with Dr. Tresa Moore for further evaluation. Use Tylenol and ibuprofen as needed for mild to moderate pain. Use Norco as needed for pain.  Have caution, this may make you tired or groggy.  Do not drive or operate heavy machinery while taking the medicine. Zofran as needed for nausea or vomiting. Make sure you are staying well-hydrated water. Return to the emergency room if you develop high fevers, persistent vomiting, severe worsening pain, inability urinate, any new, worsening, concerning symptoms.

## 2018-09-23 NOTE — ED Notes (Signed)
Pt provided a urine cup for a urine sample.

## 2018-09-23 NOTE — ED Triage Notes (Signed)
Pt reports been passing pieces of kidney stones for 2 days. Having RLQ pains. Today had nausea and vomiting.

## 2018-09-23 NOTE — ED Notes (Addendum)
Pt given urinal and a warm blanket

## 2018-09-24 NOTE — ED Provider Notes (Signed)
[facility] DEPT Provider Note   CSN: [id number] Arrival date & time: 09/23/18  1725     History   Chief Complaint Chief Complaint  Patient presents with   Abdominal Pain   Nephrolithiasis   Emesis    HPI Devin POCOCK is a 38 y.o. male presenting for evaluation of nausea, vomiting, abdominal pain.  Patient states yesterday he started to develop right-sided flank pain and lower abdominal pain.  Today he has had persistent nausea and vomiting.  This feels consistent with when he has had kidney stones in the past.  Patient states every few months he has a kidney stone, most recently needed bilateral stents placed.  He sees Dr. Tammi Klippel with [facility].  He tried to take ibuprofen this morning, however immediately threw up.  He denies fever, chills, chest pain, left-sided pain, dysuria, hematuria, or abnormal bowel movements.  He has no other medical problems.     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   Wears glasses     Patient Active Problem List   Diagnosis Date Noted   Hydronephrosis of right kidney 09/28/2017    Past Surgical History:  Procedure Laterality Date   CYSTOSCOPY WITH RETROGRADE PYELOGRAM, URETEROSCOPY AND STENT PLACEMENT Bilateral 06/26/2017   Procedure: CYSTOSCOPY WITH RETROGRADE PYELOGRAM, URETEROSCOPY, STONE BASKETRY  AND STENT PLACEMENT;  Surgeon: Alexis Frock, MD;  Location: [facility];  Service: Urology;  Laterality: Bilateral;   CYSTOSCOPY WITH RETROGRADE PYELOGRAM, URETEROSCOPY AND STENT PLACEMENT Right 09/18/2017   Procedure: CYSTOSCOPY WITH RETROGRADE PYELOGRAM, URETEROSCOPY AND STENT PLACEMENT;  Surgeon: Ardis Hughs, MD;  Location: [facility];  Service: Urology;  Laterality: Right;   CYSTOSCOPY WITH RETROGRADE PYELOGRAM, URETEROSCOPY AND  STENT PLACEMENT Bilateral 10/13/2017   Procedure: CYSTOSCOPY WITH RETROGRADE PYELOGRAM, BILATERAL URETEROSCOPY AND BILATERAL STENT PLACEMENT, LASER;  Surgeon: Alexis Frock, MD;  Location: [facility];  Service: Urology;  Laterality: Bilateral;  26 MINS   CYSTOSCOPY WITH RETROGRADE PYELOGRAM, URETEROSCOPY AND STENT PLACEMENT Bilateral 03/24/2018   Procedure: CYSTOSCOPY WITH RETROGRADE PYELOGRAM, URETEROSCOPY AND STENT PLACEMENT;  Surgeon: Alexis Frock, MD;  Location: [facility];  Service: Urology;  Laterality: Bilateral;  1 HR   CYSTOSCOPY WITH RETROGRADE PYELOGRAM, URETEROSCOPY AND STENT PLACEMENT Bilateral 07/07/2018   Procedure: CYSTOSCOPY WITH RETROGRADE PYELOGRAM, URETEROSCOPY AND STENT PLACEMENT;  Surgeon: Alexis Frock, MD;  Location: [facility];  Service: Urology;  Laterality: Bilateral;  75 MINUTES   CYSTOSCOPY/RETROGRADE/URETEROSCOPY/STONE EXTRACTION WITH BASKET Left 10/05/2017   Procedure: CYSTOSCOPY/RETROGRADE/STONE REMOVAL FROM BLADDER ;  Surgeon: Irine Seal, MD;  Location: [facility];  Service: Urology;  Laterality: Left;   CYSTOSCOPY/URETEROSCOPY/HOLMIUM LASER/STENT PLACEMENT Right 09/28/2017   Procedure: CYSTOSCOPY/RIGHT RETROGRADE URETEROSCOPY/HOLMIUM LASER/ RIGHT STENT PLACEMENT;  Surgeon: Ceasar Mons, MD;  Location: [facility];  Service: Urology;  Laterality: Right;   CYSTOSCOPY/URETEROSCOPY/HOLMIUM LASER/STENT PLACEMENT Bilateral 01/27/2018   Procedure: CYSTOSCOPY/URETEROSCOPY/HOLMIUM LASER/STENT PLACEMENT;  Surgeon: Alexis Frock, MD;  Location: [facility];  Service: Urology;  Laterality: Bilateral;   HOLMIUM LASER APPLICATION Bilateral 2/84/1324   Procedure: HOLMIUM LASER APPLICATION;  Surgeon: Alexis Frock, MD;  Location: [facility];  Service: Urology;  Laterality: Bilateral;   HOLMIUM LASER APPLICATION Right 04/07/270   Procedure: HOLMIUM LASER APPLICATION;  Surgeon: Ardis Hughs, MD;  Location: [facility];  Service: Urology;   Laterality: Right;   HOLMIUM  LASER APPLICATION Bilateral 10/13/2017   Procedure: HOLMIUM LASER APPLICATION;  Surgeon: Sebastian AcheManny, Theodore, MD;  Location: [facility];  Service: Urology;  Laterality: Bilateral;   HOLMIUM LASER APPLICATION Bilateral 01/27/2018   Procedure: HOLMIUM LASER APPLICATION;  Surgeon: Sebastian AcheManny, Theodore, MD;  [facility];  Service: Urology;  Laterality: Bilateral;   HOLMIUM LASER APPLICATION Bilateral 03/24/2018   Procedure: HOLMIUM LASER APPLICATION;  Surgeon: Sebastian AcheManny, Theodore, MD;  Location: [facility];  Service: Urology;  Laterality: Bilateral;   HOLMIUM LASER APPLICATION Bilateral 07/07/2018   Procedure: HOLMIUM LASER APPLICATION;  Surgeon: Sebastian AcheManny, Theodore, MD;  Location[facility];  Service: Urology;  Laterality: Bilateral;   PERCUTANEOUS NEPHROSTOLITHOTOMY  2003;  2009;  06[facility]22-2014  [email or web address]   URETEROSCOPIC STONE MANIPULATION UNILATERAL  multiple since 1997--  [facility] one [facility]-19-2017  [facility] dr gutierrez-azceves        Home Medications    Prior to Admission medications   Medication Sig Start Date End Date Taking? Authorizing Provider  ibuprofen (ADVIL) 200 MG tablet Take 400 mg by mouth every 6 (six) hours as needed for moderate pain.   Yes [provider]  Potassium Citrate 15 MEQ (1620 MG) TBCR Take 2 tablets by mouth 2 (two) times daily. 07/14/18  Yes [provider]  cephALEXin (KEFLEX) 500 MG capsule Take 1 capsule (500 mg total) by mouth 2 (two) times daily. X 3 days to prevent infection with stents in place. Patient not taking: Reported on 09/23/2018 07/07/18   Sebastian AcheManny, Theodore, MD  HYDROcodone-acetaminophen (NORCO/VICODIN) 5-325 MG tablet Take 1 tablet by mouth every 6 (six) hours as needed for severe pain. 09/23/18   Ciria Bernardini, PA-C  HYDROmorphone (DILAUDID) 2 MG tablet Take 1 tablet (2 mg total) by mouth every 4 (four) hours as needed for severe pain. Patient not taking: Reported on 09/23/2018 07/09/18   Dione BoozeGlick, David, MD    ondansetron (ZOFRAN ODT) 4 MG disintegrating tablet Take 1 tablet (4 mg total) by mouth every 8 (eight) hours as needed for nausea or vomiting. 09/23/18   Leshay Desaulniers, PA-C    Family History No family history on file.  Social History Social History   Tobacco Use   Smoking status: Never Smoker   Smokeless tobacco: Never Used  Substance Use Topics   Alcohol use: Never    Frequency: Never   Drug use: Never     Allergies   Morphine and Other   Review of Systems Review of Systems  Gastrointestinal: Positive for abdominal pain, nausea and vomiting.  Genitourinary: Positive for flank pain.  All other systems reviewed and are negative.    Physical Exam Updated Vital Signs BP (!) 141/89    Pulse 73    Temp 98.3 F (36.8 C) (Oral)    Resp 17    SpO2 96%   Physical Exam Vitals signs and nursing note reviewed.  Constitutional:      General: He is not in acute distress.    Appearance: He is well-developed.     Comments: Resting comfortably in bed in no acute distress  HENT:     Head: Normocephalic and atraumatic.  Eyes:     Conjunctiva/sclera: Conjunctivae normal.     Pupils: Pupils are equal, round, and reactive to light.  Neck:     Musculoskeletal: Normal range of motion and neck supple.  Cardiovascular:     Rate and Rhythm: Normal rate and regular rhythm.     Pulses: Normal pulses.  Pulmonary:     Effort: Pulmonary  effort is normal. No respiratory distress.     Breath sounds: Normal breath sounds. No wheezing.  Abdominal:     General: Bowel sounds are normal. There is no distension.     Palpations: Abdomen is soft.     Tenderness: There is abdominal tenderness in the right lower quadrant. There is right CVA tenderness.     Comments: Tenderness palpation of right lower quadrant, right flank, right CVA.  No tenderness palpation of the left side.  Abdomen is soft no rigidity, guarding, distention.  Negative rebound.  Musculoskeletal: Normal range of motion.   Skin:    General: Skin is warm and dry.     Capillary Refill: Capillary refill takes less than 2 seconds.  Neurological:     Mental Status: He is alert and oriented to person, place, and time.      ED Treatments / Results  Labs (all labs ordered are listed, but only abnormal results are displayed) Labs Reviewed  URINALYSIS, ROUTINE W REFLEX MICROSCOPIC - Abnormal; Notable for the following components:      Result Value   Color, Urine STRAW (*)    All other components within normal limits    EKG None  Radiology Ct Renal Stone Study  Result Date: 09/23/2018 CLINICAL DATA:  Flank pain, recurrent stone disease suspected, recent passage of kidney stones, right lower quadrant pain EXAM: CT ABDOMEN AND PELVIS WITHOUT CONTRAST TECHNIQUE: Multidetector CT imaging of the abdomen and pelvis was performed following the standard protocol without IV contrast. COMPARISON:  CT 07/09/2018 FINDINGS: Lower chest: Lung bases are clear. Normal heart size. No pericardial effusion. Hepatobiliary: No focal liver abnormality is seen. No gallstones, gallbladder wall thickening, or biliary dilatation. Pancreas: Fatty replacement of the pancreas. No pancreatic ductal dilatation or surrounding inflammatory changes. Spleen: Normal in size without focal abnormality. Adrenals/Urinary Tract: Normal adrenal glands. Mild bilateral symmetric perinephric stranding, a nonspecific finding. There is bilateral nonobstructing calculi in both kidneys, left more numerous than right. Mild periureteral stranding is present. The urinary bladder is largely decompressed but with some apparent wall thickening and faint perivesicular haze. No bladder or visible urethral calculi. Stomach/Bowel: Distal esophagus, stomach and duodenal sweep are unremarkable. No bowel wall thickening or dilatation. No evidence of obstruction. A normal appendix is visualized. No colonic dilatation or wall thickening. Vascular/Lymphatic: No significant vascular  findings are present. Greater than expected number of normal appearing lymph nodes in the mid mesentery, nonspecific. No enlarged abdominal or pelvic lymph nodes. Reproductive: The prostate and seminal vesicles are unremarkable. Other: No abdominopelvic free fluid or free gas. No bowel containing hernias. Bilateral fat containing inguinal hernias. Small fat containing umbilical hernia. Musculoskeletal: No acute osseous abnormality or suspicious osseous lesion. IMPRESSION: 1. Bilateral nonobstructing nephrolithiasis, left more numerous than right. No obstructive urolithiasis. 2. Mild bilateral symmetric perinephric stranding and periureteral stranding, can be seen with ascending urinary tract infection as well as recently passed calculi. Correlate with clinical symptoms and results of urinalysis to better differentiate. 3. Greater than expected number of normal appearing lymph nodes in the mid mesentery, nonspecific, but can be seen with early or developing mesenteritis Electronically Signed   By: Kreg ShropshirePrice  DeHay M.D.   On: 09/23/2018 22:32    Procedures Procedures (including critical care time)  Medications Ordered in ED Medications  ketorolac (TORADOL) 15 MG/ML injection 15 mg (15 mg Intramuscular Given 09/23/18 2202)  ondansetron (ZOFRAN-ODT) disintegrating tablet 4 mg (4 mg Oral Given 09/23/18 2202)  HYDROcodone-acetaminophen (NORCO/VICODIN) 5-325 MG per tablet 1 tablet (1  tablet Oral Given 09/23/18 2202)     Initial Impression / Assessment and Plan / ED Course  I have reviewed the triage vital signs and the nursing notes.  Pertinent labs & imaging results that were available during my care of the patient were reviewed by me and considered in my medical decision making (see chart for details).        Patient presenting for evaluation of nausea, vomiting, dental pain.  Physical examination, he appears nontoxic.  History of frequent kidney stones, most recently needed bilateral stents due to  obstructing stones.  Patient likely with kidney stones, he is worried about obstruction.  As he is urinating, low suspicion obstruction, will obtain CT for further visualization of the stone. UA to r/o infection.   CT consistent with recently passed stone.  Urine without infection.  As patient is without urinary symptoms and the infection is urine, doubt ascending UTI.  On reassessment, patient reports no further nausea or vomiting.  Pain is improving, but not fully resolved.  Encourage follow-up with urology for further evaluation.  At this time, patient appears safe for discharge.  Return precautions given.  Patient states he understands and agrees to plan.  Final Clinical Impressions(s) / ED Diagnoses   Final diagnoses:  Kidney stone    ED Discharge Orders         Ordered    ondansetron (ZOFRAN ODT) 4 MG disintegrating tablet  Every 8 hours PRN     09/23/18 2258    HYDROcodone-acetaminophen (NORCO/VICODIN) 5-325 MG tablet  Every 6 hours PRN     09/23/18 2258           Cimone Fahey, PA-C 09/24/18 0146    Arby Barrette, MD 09/27/18 1535

## 2019-01-09 ENCOUNTER — Emergency Department (HOSPITAL_COMMUNITY): Payer: 59

## 2019-01-09 ENCOUNTER — Other Ambulatory Visit: Payer: Self-pay

## 2019-01-09 ENCOUNTER — Emergency Department (HOSPITAL_COMMUNITY)
Admission: EM | Admit: 2019-01-09 | Discharge: 2019-01-09 | Disposition: A | Discharge status: Home / Self Care | Payer: 59 | Attending: Emergency Medicine | Admitting: Emergency Medicine

## 2019-01-09 ENCOUNTER — Encounter (HOSPITAL_COMMUNITY): Payer: Self-pay | Admitting: Emergency Medicine

## 2019-01-09 DIAGNOSIS — R82998 Other abnormal findings in urine: Secondary | ICD-10-CM | POA: Diagnosis not present

## 2019-01-09 DIAGNOSIS — R112 Nausea with vomiting, unspecified: Secondary | ICD-10-CM | POA: Insufficient documentation

## 2019-01-09 DIAGNOSIS — R109 Unspecified abdominal pain: Secondary | ICD-10-CM | POA: Diagnosis present

## 2019-01-09 LAB — CBC
HCT: 44.3 % (ref 39.0–52.0)
Hemoglobin: 15.1 g/dL (ref 13.0–17.0)
MCH: 33.3 pg (ref 26.0–34.0)
MCHC: 34.1 g/dL (ref 30.0–36.0)
MCV: 97.6 fL (ref 80.0–100.0)
Platelets: 268 10*3/uL (ref 150–400)
RBC: 4.54 MIL/uL (ref 4.22–5.81)
RDW: 11.3 % — ABNORMAL LOW (ref 11.5–15.5)
WBC: 6.1 10*3/uL (ref 4.0–10.5)
nRBC: 0 % (ref 0.0–0.2)

## 2019-01-09 LAB — URINALYSIS, ROUTINE W REFLEX MICROSCOPIC
Bilirubin Urine: NEGATIVE
Glucose, UA: NEGATIVE mg/dL
Hgb urine dipstick: NEGATIVE
Ketones, ur: NEGATIVE mg/dL
Leukocytes,Ua: NEGATIVE
Nitrite: NEGATIVE
Protein, ur: NEGATIVE mg/dL
Specific Gravity, Urine: 1.029 (ref 1.005–1.030)
pH: 7 (ref 5.0–8.0)

## 2019-01-09 LAB — BASIC METABOLIC PANEL
Anion gap: 10 (ref 5–15)
BUN: 18 mg/dL (ref 6–20)
CO2: 25 mmol/L (ref 22–32)
Calcium: 9.3 mg/dL (ref 8.9–10.3)
Chloride: 105 mmol/L (ref 98–111)
Creatinine, Ser: 0.85 mg/dL (ref 0.61–1.24)
GFR calc Af Amer: >60 mL/min (ref >60)
GFR calc non Af Amer: >60 mL/min (ref >60)
Glucose, Bld: 102 mg/dL — ABNORMAL HIGH (ref 70–99)
Potassium: 4 mmol/L (ref 3.5–5.1)
Sodium: 140 mmol/L (ref 135–145)

## 2019-01-09 MED ORDER — SODIUM CHLORIDE 0.9 % IV BOLUS
1000.0000 mL | Freq: Once | INTRAVENOUS | Status: AC
  Administered 2019-01-09: 1000 mL via INTRAVENOUS

## 2019-01-09 MED ORDER — ONDANSETRON HCL 4 MG/2ML IJ SOLN
4.0000 mg | Freq: Once | INTRAMUSCULAR | Status: AC
  Administered 2019-01-09: 4 mg via INTRAVENOUS
  Filled 2019-01-09: qty 2

## 2019-01-09 MED ORDER — ONDANSETRON 8 MG PO TBDP: 8.0000 mg | ORAL_TABLET | Freq: Three times a day (TID) | ORAL | 0 refills | Status: DC | PRN

## 2019-01-09 MED ORDER — KETOROLAC TROMETHAMINE 30 MG/ML IJ SOLN
30.0000 mg | Freq: Once | INTRAMUSCULAR | Status: AC
  Administered 2019-01-09: 30 mg via INTRAVENOUS
  Filled 2019-01-09: qty 1

## 2019-01-09 NOTE — ED Triage Notes (Signed)
Per pt, states he has been passing fragments of  kidney stones since Friday-states increased left flank pain, dysuria-is a patient a [facility]

## 2019-01-09 NOTE — ED Notes (Signed)
Patient transported to CT 

## 2019-01-09 NOTE — ED Notes (Signed)
ED Provider at bedside. 

## 2019-01-09 NOTE — Discharge Instructions (Addendum)
Please use zofran for nausea and vomiting. OTC ibuprofen and tylenol for pain Call your urologist for follow up

## 2019-01-09 NOTE — ED Provider Notes (Signed)
[facility]-EMERGENCY DEPT Provider Note   CSN: [id number] Arrival date & time: 01/09/19  1246     History Chief Complaint  Patient presents with  . Flank Pain    Devin Sanders is a 39 y.o. male.  HPI 39 year old male history of kidney stones presents today complaining of left flank pain consistent with kidney stone pain for 2 days.  He states the pain has waxed and waned.  Is currently 7-8 out of 10.  He has had nausea and has vomited 4 times today.  He states his urine is darker than normal.  He denies any fever, chills, cough, or Covid exposure.  Patient taking OTC medications without relief.    Past Medical History:  Diagnosis Date  . GERD (gastroesophageal reflux disease)   . History of kidney stones   . 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 Wears glasses     Patient Active Problem List   Diagnosis Date Noted  . Hydronephrosis of right kidney 09/28/2017    Past Surgical History:  Procedure Laterality Date  . CYSTOSCOPY WITH RETROGRADE PYELOGRAM, URETEROSCOPY AND STENT PLACEMENT Bilateral 06/26/2017   Procedure: CYSTOSCOPY WITH RETROGRADE PYELOGRAM, URETEROSCOPY, STONE BASKETRY  AND STENT PLACEMENT;  Surgeon: [name], MD;  Location: [facility];  Service: Urology;  Laterality: Bilateral;  . CYSTOSCOPY WITH RETROGRADE PYELOGRAM, URETEROSCOPY AND STENT PLACEMENT Right 09/18/2017   Procedure: CYSTOSCOPY WITH RETROGRADE PYELOGRAM, URETEROSCOPY AND STENT PLACEMENT;  Surgeon: Crist Fat, MD;  Location: [facility];  Service: Urology;  Laterality: Right;  . CYSTOSCOPY WITH RETROGRADE PYELOGRAM, URETEROSCOPY AND STENT PLACEMENT Bilateral 10/13/2017   Procedure: CYSTOSCOPY WITH RETROGRADE PYELOGRAM, BILATERAL URETEROSCOPY AND BILATERAL STENT PLACEMENT, LASER;  Surgeon: [name], MD;  Location: [facility];  Service: Urology;  Laterality: Bilateral;  90 MINS  . CYSTOSCOPY WITH RETROGRADE PYELOGRAM,  URETEROSCOPY AND STENT PLACEMENT Bilateral 03/24/2018   Procedure: CYSTOSCOPY WITH RETROGRADE PYELOGRAM, URETEROSCOPY AND STENT PLACEMENT;  Surgeon: [name], MD;  Location: [facility];  Service: Urology;  Laterality: Bilateral;  1 HR  . CYSTOSCOPY WITH RETROGRADE PYELOGRAM, URETEROSCOPY AND STENT PLACEMENT Bilateral 07/07/2018   Procedure: CYSTOSCOPY WITH RETROGRADE PYELOGRAM, URETEROSCOPY AND STENT PLACEMENT;  Surgeon: [name], MD;  Location: [facility];  Service: Urology;  Laterality: Bilateral;  75 MINUTES  . CYSTOSCOPY/RETROGRADE/URETEROSCOPY/STONE EXTRACTION WITH BASKET Left 10/05/2017   Procedure: CYSTOSCOPY/RETROGRADE/STONE REMOVAL FROM BLADDER ;  Surgeon: Bjorn Pippin, MD;  Location: [facility];  Service: Urology;  Laterality: Left;  . CYSTOSCOPY/URETEROSCOPY/HOLMIUM LASER/STENT PLACEMENT Right 09/28/2017   Procedure: CYSTOSCOPY/RIGHT RETROGRADE URETEROSCOPY/HOLMIUM LASER/ RIGHT STENT PLACEMENT;  Surgeon: Rene Paci, MD;  [facility];  Service: Urology;  Laterality: Right;  . CYSTOSCOPY/URETEROSCOPY/HOLMIUM LASER/STENT PLACEMENT Bilateral 01/27/2018   Procedure: CYSTOSCOPY/URETEROSCOPY/HOLMIUM LASER/STENT PLACEMENT;  Surgeon: [name], MD;  Location[facility];  Service: Urology;  Laterality: Bilateral;  . HOLMIUM LASER APPLICATION Bilateral 06/26/2017   Procedure: HOLMIUM LASER APPLICATION;  Surgeon: [name], MD;  Location: [facility];  Service: Urology;  Laterality: Bilateral;  . HOLMIUM LASER APPLICATION Right 09/18/2017   Procedure: HOLMIUM LASER APPLICATION;  Surgeon: Crist Fat, MD;  Location[facility];  Service: Urology;  Laterality: Right;  . HOLMIUM LASER APPLICATION Bilateral 10/13/2017   Procedure: HOLMIUM LASER APPLICATION;  Surgeon: [name], MD;  Location: [facility];  Service: Urology;  Laterality: Bilateral;  . HOLMIUM LASER APPLICATION Bilateral 01/27/2018   Procedure: HOLMIUM LASER APPLICATION;  Surgeon:  [name], MD;  Location: [facility];  Service: Urology;  Laterality: Bilateral;  . HOLMIUM LASER APPLICATION Bilateral 03/24/2018   Procedure: HOLMIUM LASER APPLICATION;  Surgeon: [name], MD;  Location: [facility];  Service: Urology;  Laterality: Bilateral;  . HOLMIUM LASER APPLICATION Bilateral 07/07/2018   Procedure: HOLMIUM LASER APPLICATION;  Surgeon: [name], MD;  Location[facility];  Service: Urology;  Laterality: Bilateral;  . PERCUTANEOUS NEPHROSTOLITHOTOMY  2003;  2009;  06-22-2014  @ [facility]  . URETEROSCOPIC STONE MANIPULATION UNILATERAL  multiple since 1997--  last one 02-19-2017  @ [facility] by dr gutierrez-azceves       No family history on file.  Social History   Tobacco Use  . Smoking status: Never Smoker  . Smokeless tobacco: Never Used  Substance Use Topics  . Alcohol use: Never  . Drug use: Never    Home Medications Prior to Admission medications   Medication Sig Start Date End Date Taking? Authorizing Provider  cephALEXin (KEFLEX) 500 MG capsule Take 1 capsule (500 mg total) by mouth 2 (two) times daily. X 3 days to prevent infection with stents in place. Patient not taking: Reported on 09/23/2018 07/07/18   [name], MD  HYDROcodone-acetaminophen (NORCO/VICODIN) 5-325 MG tablet Take 1 tablet by mouth every 6 (six) hours as needed for severe pain. 09/23/18   Caccavale, Sophia, PA-C  HYDROmorphone (DILAUDID) 2 MG tablet Take 1 tablet (2 mg total) by mouth every 4 (four) hours as needed for severe pain. Patient not taking: Reported on 09/23/2018 07/09/18   Dione Booze, MD  ibuprofen (ADVIL) 200 MG tablet Take 400 mg by mouth every 6 (six) hours as needed for moderate pain.    [provider]  ondansetron (ZOFRAN ODT) 4 MG disintegrating tablet Take 1 tablet (4 mg total) by mouth every 8 (eight) hours as needed for nausea or vomiting. 09/23/18   Caccavale, Sophia, PA-C  Potassium Citrate 15 MEQ (1620 MG) TBCR Take 2 tablets by mouth 2  (two) times daily. 07/14/18   [provider]    Allergies    Morphine and Other  Review of Systems   Review of Systems  All other systems reviewed and are negative.   Physical Exam Updated Vital Signs BP (!) 138/101 (BP Location: Right Arm)   Pulse 87   Temp 98.9 F (37.2 C) (Oral)   Resp 16   Ht 1.778 m (5\' 10" )   Wt 95.3 kg   SpO2 97%   BMI 30.13 kg/m   Physical Exam Vitals and nursing note reviewed.  Constitutional:      Appearance: He is well-developed.  HENT:     Head: Normocephalic and atraumatic.     Right Ear: External ear normal.     Left Ear: External ear normal.     Nose: Nose normal.  Eyes:     Conjunctiva/sclera: Conjunctivae normal.     Pupils: Pupils are equal, round, and reactive to light.  Cardiovascular:     Rate and Rhythm: Normal rate and regular rhythm.     Heart sounds: Normal heart sounds.  Pulmonary:     Effort: Pulmonary effort is normal. No respiratory distress.     Breath sounds: Normal breath sounds. No wheezing.  Chest:     Chest wall: No tenderness.  Abdominal:     General: Bowel sounds are normal. There is no distension.     Palpations: Abdomen is soft. There is no mass.     Tenderness: There is no abdominal tenderness. There is no  guarding.  Musculoskeletal:        General: Normal range of motion.     Cervical back: Normal range of motion and neck supple.  Skin:    General: Skin is warm and dry.  Neurological:     Mental Status: He is alert and oriented to person, place, and time.     Motor: No abnormal muscle tone.     Coordination: Coordination normal.     Deep Tendon Reflexes: Reflexes are normal and symmetric.  Psychiatric:        Behavior: Behavior normal.        Thought Content: Thought content normal.        Judgment: Judgment normal.     ED Results / Procedures / Treatments   Labs (all labs ordered are listed, but only abnormal results are displayed) Labs Reviewed  URINALYSIS, ROUTINE W REFLEX  MICROSCOPIC - Abnormal; Notable for the following components:      Result Value   APPearance CLOUDY (*)    All other components within normal limits  BASIC METABOLIC PANEL  CBC    EKG None  Radiology CT Renal Stone Study  Result Date: 01/09/2019 CLINICAL DATA:  Per pt, states he has been passing fragments of kidney stones since Friday-states increased left flank pain, dysuria. Gross hematuria. EXAM: CT ABDOMEN AND PELVIS WITHOUT CONTRAST TECHNIQUE: Multidetector CT imaging of the abdomen and pelvis was performed following the standard protocol without IV contrast. COMPARISON:  CT abdomen pelvis 09/23/2018 FINDINGS: Lower chest: No acute abnormality. Somewhat limited evaluation of the abdominal parenchyma given lack of IV contrast. Hepatobiliary: No focal liver abnormality is seen. No gallstones, gallbladder wall thickening, or biliary dilatation. Pancreas: Unremarkable. Spleen: Normal in size without focal abnormality. Adrenals/Urinary Tract: Adrenal glands are unremarkable. There is a punctate calculus in the right kidney no hydronephrosis. There are multiple small calculi in the left kidney and a larger calculus in the inferior pole measuring 1.7 cm. No hydronephrosis. There is a too small to characterize small hypodense lesion in the posterior right kidney, similar to prior. Bladder is unremarkable. Stomach/Bowel: Stomach is within normal limits. Appendix appears normal. No evidence of bowel wall thickening, distention, or inflammatory changes. Vascular/Lymphatic: Vascular patency cannot be assessed. Multiple tiny lymph nodes in the upper central and left abdomen are similar to the prior study. Reproductive: Prostate is unremarkable. Other: Bilateral fat containing inguinal hernias. No abdominopelvic ascites. Musculoskeletal: No acute or significant osseous findings. IMPRESSION: 1. Bilateral nonobstructive nephrolithiasis. There is a large calculus in the inferior pole the left kidney measuring 1.7  cm. 2. Bilateral fat containing inguinal hernias. Electronically Signed   By: Audie Pinto M.D.   On: 01/09/2019 14:59    Procedures Procedures (including critical care time)  Medications Ordered in ED Medications  sodium chloride 0.9 % bolus 1,000 mL (has no administration in time range)  ketorolac (TORADOL) 30 MG/ML injection 30 mg (has no administration in time range)    ED Course  I have reviewed the triage vital signs and the nursing notes.  Pertinent labs & imaging results that were available during my care of the patient were reviewed by me and considered in my medical decision making (see chart for details).    MDM Rules/Calculators/A&P                      No evidence of ureteral stone on ct Patient with bilateral calculus/nonobstructing Plan f/u with urologist  Final Clinical Impression(s) / ED Diagnoses Final diagnoses:  None    Rx / DC Orders ED Discharge Orders    None       Margarita Grizzle, MD 01/09/19 [phone]

## 2019-01-11 ENCOUNTER — Other Ambulatory Visit (HOSPITAL_COMMUNITY)
Admission: RE | Admit: 2019-01-11 | Discharge: 2019-01-11 | Disposition: A | Discharge status: Home / Self Care | Payer: 59 | Source: Ambulatory Visit | Attending: Urology | Admitting: Urology

## 2019-01-11 ENCOUNTER — Other Ambulatory Visit: Payer: Self-pay | Admitting: Urology

## 2019-01-11 ENCOUNTER — Other Ambulatory Visit: Payer: Self-pay

## 2019-01-11 ENCOUNTER — Encounter (HOSPITAL_COMMUNITY): Payer: Self-pay | Admitting: Urology

## 2019-01-11 DIAGNOSIS — Z20822 Contact with and (suspected) exposure to covid-19: Secondary | ICD-10-CM | POA: Insufficient documentation

## 2019-01-11 DIAGNOSIS — Z01812 Encounter for preprocedural laboratory examination: Secondary | ICD-10-CM | POA: Diagnosis present

## 2019-01-11 LAB — SARS CORONAVIRUS 2 (TAT 6-24 HRS): SARS Coronavirus 2: NEGATIVE

## 2019-01-11 NOTE — Patient Instructions (Signed)
DUE TO COVID-19 ONLY ONE VISITOR IS ALLOWED TO COME WITH YOU AND STAY IN THE WAITING ROOM ONLY DURING PRE OP AND PROCEDURE. THE ONE VISITOR MAY VISIT WITH YOU IN YOUR PRIVATE ROOM DURING VISITING HOURS ONLY!!   COVID SWAB TESTING COMPLETED ON: Tuesday, Jan. 5, 2021 (Must self quarantine after testing. Follow instructions on handout.)             Your procedure is scheduled on: Tomorrow, Wednesday, Jan. 6, 2021   Report to [facility] Main  Entrance (Free valet parking is available at the main entrance)    Report to admitting at 3:15 PM (Bring picture ID and medical insurance card)   Call this number if you have problems the morning of surgery [phone]   Do not eat food:After Midnight.   May have liquids until 11:15 AM day of surgery   CLEAR LIQUID DIET  Foods Allowed                                                                     Foods Excluded  Water, Black Coffee and tea, regular and decaf                             liquids that you cannot  Plain Jell-O in any flavor  (No red)                                           see through such as: Fruit ices (not with fruit pulp)                                     milk, soups, orange juice  Iced Popsicles (No red)                                    All solid food Carbonated beverages, regular and diet                                    Apple juices Sports drinks like Gatorade (No red) Lightly seasoned clear broth or consume(fat free) Sugar, honey syrup  Sample Menu Breakfast                                Lunch                                     Supper Cranberry juice                    Beef broth                            Chicken broth Jell-O  Grape juice                           Apple juice Coffee or tea                        Jell-O                                      Popsicle                                                Coffee or tea                        Coffee or  tea   Brush your teeth the morning of surgery.   Do NOT smoke after Midnight   Take these medicines the morning of surgery with A SIP OF WATER: NONE                               You may not have any metal on your body including  jewelry, and body piercings             Do not wear lotions, powders, perfumes/cologne, or deodorant                         Men may shave face and neck.   Do not bring valuables to the hospital. [place] IS NOT             RESPONSIBLE   FOR VALUABLES.   Contacts, dentures or bridgework may not be worn into surgery.    Patients discharged the day of surgery will not be allowed to drive home.   Name and phone number of your driver: Lynden Ang (mom) [phone]   Special Instructions: Bring a copy of your healthcare power of attorney and living will documents         the day of surgery if you haven't scanned them in before.

## 2019-01-12 ENCOUNTER — Ambulatory Visit (HOSPITAL_COMMUNITY)
Admission: RE | Admit: 2019-01-12 | Discharge: 2019-01-12 | Disposition: A | Discharge status: Home / Self Care | Payer: 59 | Attending: Urology | Admitting: Urology

## 2019-01-12 ENCOUNTER — Ambulatory Visit (HOSPITAL_COMMUNITY): Payer: 59 | Admitting: Certified Registered Nurse Anesthetist

## 2019-01-12 ENCOUNTER — Encounter (HOSPITAL_COMMUNITY): Payer: Self-pay | Admitting: Urology

## 2019-01-12 ENCOUNTER — Ambulatory Visit (HOSPITAL_COMMUNITY): Payer: 59

## 2019-01-12 ENCOUNTER — Other Ambulatory Visit: Payer: Self-pay

## 2019-01-12 ENCOUNTER — Encounter (HOSPITAL_COMMUNITY)
Admission: RE | Disposition: A | Discharge status: Home / Self Care | Payer: Self-pay | Source: Home / Self Care | Attending: Urology

## 2019-01-12 DIAGNOSIS — Z91018 Allergy to other foods: Secondary | ICD-10-CM | POA: Diagnosis not present

## 2019-01-12 DIAGNOSIS — K219 Gastro-esophageal reflux disease without esophagitis: Secondary | ICD-10-CM | POA: Insufficient documentation

## 2019-01-12 DIAGNOSIS — N132 Hydronephrosis with renal and ureteral calculous obstruction: Secondary | ICD-10-CM | POA: Insufficient documentation

## 2019-01-12 DIAGNOSIS — N202 Calculus of kidney with calculus of ureter: Secondary | ICD-10-CM | POA: Diagnosis present

## 2019-01-12 DIAGNOSIS — E7201 Cystinuria: Secondary | ICD-10-CM | POA: Insufficient documentation

## 2019-01-12 DIAGNOSIS — Z6831 Body mass index (BMI) 31.0-31.9, adult: Secondary | ICD-10-CM | POA: Insufficient documentation

## 2019-01-12 DIAGNOSIS — Z87442 Personal history of urinary calculi: Secondary | ICD-10-CM | POA: Insufficient documentation

## 2019-01-12 DIAGNOSIS — E669 Obesity, unspecified: Secondary | ICD-10-CM | POA: Diagnosis not present

## 2019-01-12 DIAGNOSIS — Z885 Allergy status to narcotic agent status: Secondary | ICD-10-CM | POA: Diagnosis not present

## 2019-01-12 HISTORY — PX: CYSTOSCOPY WITH RETROGRADE PYELOGRAM, URETEROSCOPY AND STENT PLACEMENT: SHX5789

## 2019-01-12 HISTORY — PX: HOLMIUM LASER APPLICATION: SHX5852

## 2019-01-12 SURGERY — CYSTOURETEROSCOPY, WITH RETROGRADE PYELOGRAM AND STENT INSERTION
Anesthesia: General | Site: Ureter | Laterality: Left

## 2019-01-12 MED ORDER — MIDAZOLAM HCL 2 MG/2ML IJ SOLN
INTRAMUSCULAR | Status: DC | PRN
  Administered 2019-01-12 (×2): 1 mg via INTRAVENOUS

## 2019-01-12 MED ORDER — IOHEXOL 300 MG/ML  SOLN
INTRAMUSCULAR | Status: DC | PRN
  Administered 2019-01-12: 50 mL via URETHRAL

## 2019-01-12 MED ORDER — DIPHENHYDRAMINE HCL 50 MG/ML IJ SOLN: 12.5000 mg | Freq: Once | INTRAMUSCULAR | Status: AC | PRN

## 2019-01-12 MED ORDER — OXYCODONE HCL 5 MG PO TABS: 5.0000 mg | ORAL_TABLET | Freq: Once | ORAL | Status: AC | PRN

## 2019-01-12 MED ORDER — FENTANYL CITRATE (PF) 100 MCG/2ML IJ SOLN
INTRAMUSCULAR | Status: AC
  Filled 2019-01-12: qty 2

## 2019-01-12 MED ORDER — PROPOFOL 10 MG/ML IV BOLUS
INTRAVENOUS | Status: AC
  Filled 2019-01-12: qty 20

## 2019-01-12 MED ORDER — FENTANYL CITRATE (PF) 100 MCG/2ML IJ SOLN
25.0000 ug | INTRAMUSCULAR | Status: DC | PRN
  Administered 2019-01-12: 50 ug via INTRAVENOUS

## 2019-01-12 MED ORDER — SODIUM CHLORIDE 0.9 % IR SOLN
Status: DC | PRN
  Administered 2019-01-12: 3000 mL

## 2019-01-12 MED ORDER — SODIUM CHLORIDE 0.9 % IV SOLN
1.0000 g | Freq: Once | INTRAVENOUS | Status: AC
  Administered 2019-01-12: 1 g via INTRAVENOUS
  Filled 2019-01-12: qty 1

## 2019-01-12 MED ORDER — FENTANYL CITRATE (PF) 100 MCG/2ML IJ SOLN
INTRAMUSCULAR | Status: AC
  Administered 2019-01-12: 20:00:00 50 ug via INTRAVENOUS
  Filled 2019-01-12: qty 2

## 2019-01-12 MED ORDER — LIDOCAINE 2% (20 MG/ML) 5 ML SYRINGE
INTRAMUSCULAR | Status: DC | PRN
  Administered 2019-01-12: 80 mg via INTRAVENOUS

## 2019-01-12 MED ORDER — ONDANSETRON HCL 4 MG/2ML IJ SOLN
INTRAMUSCULAR | Status: DC | PRN
  Administered 2019-01-12: 4 mg via INTRAVENOUS

## 2019-01-12 MED ORDER — DEXAMETHASONE SODIUM PHOSPHATE 10 MG/ML IJ SOLN
INTRAMUSCULAR | Status: DC | PRN
  Administered 2019-01-12: 10 mg via INTRAVENOUS

## 2019-01-12 MED ORDER — FENTANYL CITRATE (PF) 100 MCG/2ML IJ SOLN
50.0000 ug | INTRAMUSCULAR | Status: AC | PRN
  Administered 2019-01-12: 50 ug via INTRAVENOUS

## 2019-01-12 MED ORDER — PROMETHAZINE HCL 25 MG/ML IJ SOLN: 6.2500 mg | INTRAMUSCULAR | Status: DC | PRN

## 2019-01-12 MED ORDER — LACTATED RINGERS IV SOLN: INTRAVENOUS | Status: DC

## 2019-01-12 MED ORDER — PROPOFOL 10 MG/ML IV BOLUS
INTRAVENOUS | Status: DC | PRN
  Administered 2019-01-12: 200 mg via INTRAVENOUS

## 2019-01-12 MED ORDER — DIPHENHYDRAMINE HCL 50 MG/ML IJ SOLN
INTRAMUSCULAR | Status: AC
  Administered 2019-01-12: 12.5 mg via INTRAVENOUS
  Filled 2019-01-12: qty 1

## 2019-01-12 MED ORDER — MIDAZOLAM HCL 2 MG/2ML IJ SOLN
INTRAMUSCULAR | Status: AC
  Filled 2019-01-12: qty 2

## 2019-01-12 MED ORDER — OXYCODONE HCL 5 MG PO TABS
ORAL_TABLET | ORAL | Status: AC
  Administered 2019-01-12: 5 mg via ORAL
  Filled 2019-01-12: qty 1

## 2019-01-12 MED ORDER — FENTANYL CITRATE (PF) 100 MCG/2ML IJ SOLN
INTRAMUSCULAR | Status: DC | PRN
  Administered 2019-01-12 (×2): 50 ug via INTRAVENOUS

## 2019-01-12 MED ORDER — FENTANYL CITRATE (PF) 100 MCG/2ML IJ SOLN
INTRAMUSCULAR | Status: AC
  Administered 2019-01-12: 50 ug via INTRAVENOUS
  Filled 2019-01-12: qty 2

## 2019-01-12 SURGICAL SUPPLY — 27 items
BAG URO CATCHER STRL LF (MISCELLANEOUS) ×3 IMPLANT
BASKET LASER NITINOL 1.9FR (BASKET) IMPLANT
BSKT STON RTRVL 120 1.9FR (BASKET)
CATH INTERMIT  6FR 70CM (CATHETERS) ×3 IMPLANT
CLOTH BEACON ORANGE TIMEOUT ST (SAFETY) ×3 IMPLANT
COVER SURGICAL LIGHT HANDLE (MISCELLANEOUS) ×3 IMPLANT
COVER WAND RF STERILE (DRAPES) IMPLANT
EXTRACTOR STONE 1.7FRX115CM (UROLOGICAL SUPPLIES) IMPLANT
FIBER LASER FLEXIVA 1000 (UROLOGICAL SUPPLIES) IMPLANT
FIBER LASER FLEXIVA 365 (UROLOGICAL SUPPLIES) IMPLANT
FIBER LASER FLEXIVA 550 (UROLOGICAL SUPPLIES) IMPLANT
FIBER LASER TRAC TIP (UROLOGICAL SUPPLIES) ×2 IMPLANT
GLOVE BIOGEL M STRL SZ7.5 (GLOVE) ×3 IMPLANT
GOWN STRL REUS W/TWL LRG LVL3 (GOWN DISPOSABLE) ×3 IMPLANT
GUIDEWIRE ANG ZIPWIRE 038X150 (WIRE) ×3 IMPLANT
GUIDEWIRE STR DUAL SENSOR (WIRE) ×5 IMPLANT
KIT TURNOVER KIT A (KITS) IMPLANT
MANIFOLD NEPTUNE II (INSTRUMENTS) ×3 IMPLANT
PACK CYSTO (CUSTOM PROCEDURE TRAY) ×3 IMPLANT
PENCIL SMOKE EVACUATOR (MISCELLANEOUS) IMPLANT
SHEATH URETERAL 12FRX28CM (UROLOGICAL SUPPLIES) IMPLANT
SHEATH URETERAL 12FRX35CM (MISCELLANEOUS) ×2 IMPLANT
STENT POLARIS 5FRX24 (STENTS) ×2 IMPLANT
TUBE FEEDING 8FR 16IN STR KANG (MISCELLANEOUS) ×3 IMPLANT
TUBING CONNECTING 10 (TUBING) ×2 IMPLANT
TUBING CONNECTING 10' (TUBING) ×1
TUBING UROLOGY SET (TUBING) ×3 IMPLANT

## 2019-01-12 NOTE — Transfer of Care (Signed)
Immediate Anesthesia Transfer of Care Note  Patient: Devin Sanders  Procedure(s) Performed: CYSTOSCOPY WITH RETROGRADE PYELOGRAM, URETEROSCOPY AND STENT PLACEMENT (Left Ureter) HOLMIUM LASER APPLICATION (Left Ureter)  Patient Location: PACU  Anesthesia Type:General  Level of Consciousness: drowsy and patient cooperative  Airway & Oxygen Therapy: Patient Spontanous Breathing and Patient connected to face mask  Post-op Assessment: Report given to RN and Post -op Vital signs reviewed and stable  Post vital signs: Reviewed and stable  Last Vitals:  Vitals Value Taken Time  BP 133/89 01/12/19 1900  Temp    Pulse 75 01/12/19 1902  Resp 10 01/12/19 1902  SpO2 100 % 01/12/19 1902  Vitals shown include unvalidated device data.  Last Pain:  Vitals:   01/12/19 1738  TempSrc:   PainSc: 6       Patients Stated Pain Goal: 4 (01/12/19 1541)  Complications: No apparent anesthesia complications

## 2019-01-12 NOTE — Discharge Instructions (Signed)
1 - You may have urinary urgency (bladder spasms) and bloody urine on / off with stent in place. This is normal. ° °2 - Call MD or go to ER for fever >102, severe pain / nausea / vomiting not relieved by medications, or acute change in medical status ° °

## 2019-01-12 NOTE — Anesthesia Preprocedure Evaluation (Signed)
Anesthesia Evaluation  Patient identified by MRN, date of birth, ID band Patient awake    Reviewed: Allergy & Precautions, NPO status , Patient's Chart, lab work & pertinent test results  History of Anesthesia Complications Negative for: history of anesthetic complications  Airway Mallampati: I  TM Distance: >3 FB Neck ROM: Full    Dental  (+) Teeth Intact, Dental Advisory Given   Pulmonary neg pulmonary ROS,    Pulmonary exam normal breath sounds clear to auscultation       Cardiovascular Exercise Tolerance: Good negative cardio ROS Normal cardiovascular exam Rhythm:Regular Rate:Normal     Neuro/Psych negative neurological ROS  negative psych ROS   GI/Hepatic Neg liver ROS, GERD  ,  Endo/Other  Obesity   Renal/GU Renal disease (Left renal and ureteral stones)     Musculoskeletal negative musculoskeletal ROS (+)   Abdominal   Peds  Hematology negative hematology ROS (+)   Anesthesia Other Findings Day of surgery medications reviewed with the patient.  Reproductive/Obstetrics                             Anesthesia Physical Anesthesia Plan  ASA: II  Anesthesia Plan: General   Post-op Pain Management:    Induction: Intravenous  PONV Risk Score and Plan: 3 and Midazolam, Dexamethasone and Ondansetron  Airway Management Planned: LMA  Additional Equipment:   Intra-op Plan:   Post-operative Plan: Extubation in OR  Informed Consent: I have reviewed the patients History and Physical, chart, labs and discussed the procedure including the risks, benefits and alternatives for the proposed anesthesia with the patient or authorized representative who has indicated his/her understanding and acceptance.     Dental advisory given  Plan Discussed with: CRNA  Anesthesia Plan Comments:         Anesthesia Quick Evaluation

## 2019-01-12 NOTE — Brief Op Note (Signed)
01/12/2019  6:51 PM  PATIENT:  [name]  39 y.o. male  PRE-OPERATIVE DIAGNOSIS:  LEFT RENAL AND URETERAL STONES  POST-OPERATIVE DIAGNOSIS:  LEFT RENAL AND URETERALSTONES   PROCEDURE:  Procedure(s) with comments: CYSTOSCOPY WITH RETROGRADE PYELOGRAM, URETEROSCOPY AND STENT PLACEMENT (Left) - 90 MINS HOLMIUM LASER APPLICATION (Left)  SURGEON:  Surgeon(s) and Role:    [name], MD - Primary  PHYSICIAN ASSISTANT:   ASSISTANTS: none   ANESTHESIA:   general  EBL:  0 mL   BLOOD ADMINISTERED:none  DRAINS: none   LOCAL MEDICATIONS USED:  NONE  SPECIMEN:  No Specimen  DISPOSITION OF SPECIMEN:  N/A  COUNTS:  YES  TOURNIQUET:  * No tourniquets in log *  DICTATION: .Other Dictation: Dictation Number [id number]  PLAN OF CARE: Discharge to home after PACU  PATIENT DISPOSITION:  PACU - hemodynamically stable.   Delay start of Pharmacological VTE agent (>24hrs) due to surgical blood loss or risk of bleeding: yes

## 2019-01-12 NOTE — H&P (Signed)
Devin Sanders is an 39 y.o. male.    Chief Complaint: Pre-OP LEFT 1st stage Ureteroscopic Stone Manipulation  HPI:   1 - Recurrent Nephrolithiasis -  Pre 2019 - PCNL several each side, URS x innumerable each side at [facility].  02/2017 - left URS for 1.5cm stone to stone reduction (not stone free) at [facility] per OR notes  06/2017 - bilateral URS to stone free (Yarisa Lynam)  09/2017 - Rt ureteroscopy / Lt medical passage Tresa Moore / Jeffie Pollock); 10/2017 - bilateral URS to stone free  01/2018 - bilateral URS to stone free  01/2019 - Left 3cm scattered renal stones, Rt stone free by ER CT.   2 - Cysteinuria - ON thiola 500mg  BID / day PLUS K-Cit 2000MEQ BID meals for cysteinuria. CMP, CBC 02/2017 normal, Cr 0.9.   PMH sig for mild obesity. 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. His PCP is Morrie Sheldon MD.   Today " Devin Sanders " is seen to proceed with left 1st stage ureteroscopic stone manipulation for large volume recurrent left renal / UPJ stone. No interval fevers. C19 screen negative. Most recent UA without infectious parameters.     Past Medical History:  Diagnosis Date  . GERD (gastroesophageal reflux disease)    pt denied  . History of kidney stones   . 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 Wears glasses     Past Surgical History:  Procedure Laterality Date  . CYSTOSCOPY WITH RETROGRADE PYELOGRAM, URETEROSCOPY AND STENT PLACEMENT Bilateral 06/26/2017   Procedure: CYSTOSCOPY WITH RETROGRADE PYELOGRAM, URETEROSCOPY, STONE BASKETRY  AND STENT PLACEMENT;  Surgeon: Alexis Frock, MD;  Location: [facility];  Service: Urology;  Laterality: Bilateral;  . CYSTOSCOPY WITH RETROGRADE PYELOGRAM, URETEROSCOPY AND STENT PLACEMENT Right 09/18/2017   Procedure: CYSTOSCOPY WITH RETROGRADE PYELOGRAM, URETEROSCOPY AND STENT PLACEMENT;  Surgeon: Ardis Hughs, MD;  Location: [facility];  Service: Urology;  Laterality: Right;  . CYSTOSCOPY WITH  RETROGRADE PYELOGRAM, URETEROSCOPY AND STENT PLACEMENT Bilateral 10/13/2017   Procedure: CYSTOSCOPY WITH RETROGRADE PYELOGRAM, BILATERAL URETEROSCOPY AND BILATERAL STENT PLACEMENT, LASER;  Surgeon: Alexis Frock, MD;  Location: [facility];  Service: Urology;  Laterality: Bilateral;  90 MINS  . CYSTOSCOPY WITH RETROGRADE PYELOGRAM, URETEROSCOPY AND STENT PLACEMENT Bilateral 03/24/2018   Procedure: CYSTOSCOPY WITH RETROGRADE PYELOGRAM, URETEROSCOPY AND STENT PLACEMENT;  Surgeon: Alexis Frock, MD;  Location: [facility];  Service: Urology;  Laterality: Bilateral;  1 HR  . CYSTOSCOPY WITH RETROGRADE PYELOGRAM, URETEROSCOPY AND STENT PLACEMENT Bilateral 07/07/2018   Procedure: CYSTOSCOPY WITH RETROGRADE PYELOGRAM, URETEROSCOPY AND STENT PLACEMENT;  Surgeon: Alexis Frock, MD;  Location: [facility];  Service: Urology;  Laterality: Bilateral;  75 MINUTES  . CYSTOSCOPY/RETROGRADE/URETEROSCOPY/STONE EXTRACTION WITH BASKET Left 10/05/2017   Procedure: CYSTOSCOPY/RETROGRADE/STONE REMOVAL FROM BLADDER ;  Surgeon: Irine Seal, MD;  Location: [facility];  Service: Urology;  Laterality: Left;  . CYSTOSCOPY/URETEROSCOPY/HOLMIUM LASER/STENT PLACEMENT Right 09/28/2017   Procedure: CYSTOSCOPY/RIGHT RETROGRADE URETEROSCOPY/HOLMIUM LASER/ RIGHT STENT PLACEMENT;  Surgeon: Ceasar Mons, MD;  Location: [facility];  Service: Urology;  Laterality: Right;  . CYSTOSCOPY/URETEROSCOPY/HOLMIUM LASER/STENT PLACEMENT Bilateral 01/27/2018   Procedure: CYSTOSCOPY/URETEROSCOPY/HOLMIUM LASER/STENT PLACEMENT;  Surgeon: Alexis Frock, MD;  Location: [facility];  Service: Urology;  Laterality: Bilateral;  . HOLMIUM LASER APPLICATION Bilateral 6/78/9381   Procedure: HOLMIUM LASER APPLICATION;  Surgeon: Alexis Frock, MD;  Location: [facility];  Service: Urology;  Laterality: Bilateral;  . HOLMIUM LASER APPLICATION Right 0/17/5102  Procedure: HOLMIUM LASER APPLICATION;  Surgeon: Crist Fat, MD;  Location:  [facility];  Service: Urology;  Laterality: Right;  . HOLMIUM LASER APPLICATION Bilateral 10/13/2017   Procedure: HOLMIUM LASER APPLICATION;  Surgeon: [name], MD;  Location: [facility];  Service: Urology;  Laterality: Bilateral;  . HOLMIUM LASER APPLICATION Bilateral 01/27/2018   Procedure: HOLMIUM LASER APPLICATION;  Surgeon: [name], MD;  Location: [facility];  Service: Urology;  Laterality: Bilateral;  . HOLMIUM LASER APPLICATION Bilateral 03/24/2018   Procedure: HOLMIUM LASER APPLICATION;  Surgeon: [name], MD;  Location: [facility];  Service: Urology;  Laterality: Bilateral;  . HOLMIUM LASER APPLICATION Bilateral 07/07/2018   Procedure: HOLMIUM LASER APPLICATION;  Surgeon: [name], MD;  Location: [facility];  Service: Urology;  Laterality: Bilateral;  . PERCUTANEOUS NEPHROSTOLITHOTOMY  2003;  2009;  06-22-2014  @ [facility]  . URETEROSCOPIC STONE MANIPULATION UNILATERAL  multiple since 1997--  last one 02-19-2017  @ [facility] by [email or web address]    History reviewed. No pertinent family history. Social History:  reports that he has never smoked. He has never used smokeless tobacco. He reports that he does not drink alcohol or use drugs.  Allergies:  Allergies  Allergen Reactions  . Morphine Nausea And Vomiting  . Other Hives and Swelling    Mangos    No medications prior to admission.    Results for orders placed or performed during the hospital encounter of 01/11/19 (from the past 48 hour(s))  SARS CORONAVIRUS 2 (TAT 6-24 HRS) Nasopharyngeal Nasopharyngeal Swab     Status: None   Collection Time: 01/11/19  1:36 PM   Specimen: Nasopharyngeal Swab  Result Value Ref Range   SARS Coronavirus 2 NEGATIVE NEGATIVE    Comment: (NOTE) SARS-CoV-2 target nucleic acids are NOT DETECTED. The SARS-CoV-2 RNA is generally detectable in upper and lower respiratory specimens during the acute phase of infection. Negative results do not preclude SARS-CoV-2 infection, do  not rule out co-infections with other pathogens, and should not be used as the sole basis for treatment or other patient management decisions. Negative results must be combined with clinical observations, patient history, and epidemiological information. The expected result is Negative. Fact Sheet for Patients: HairSlick.no Fact Sheet for Healthcare Providers: quierodirigir.com This test is not yet approved or cleared by the [place] FDA and  has been authorized for detection and/or diagnosis of SARS-CoV-2 by FDA under an Emergency Use Authorization (EUA). This EUA will remain  in effect (meaning this test can be used) for the duration of the COVID-19 declaration under Section 56 4(b)(1) of the Act, 21 U.S.C. section 360bbb-3(b)(1), unless the authorization is terminated or revoked sooner. Performed at [facility], [address] [place], [place] [address]    No results found.  Review of Systems  Constitutional: Negative for fever.  Genitourinary: Positive for flank pain.    Height 5\' 10"  (1.778 m), weight 95.3 kg. Physical Exam  Constitutional: He appears well-developed.  HENT:  Head: Normocephalic.  Eyes: Pupils are equal, round, and reactive to light.  Cardiovascular: Normal rate.  Respiratory: Effort normal.  GI: Soft.  Mild left CVAT  Musculoskeletal:        General: Normal range of motion.     Cervical back: Normal range of motion.  Neurological: He is alert.  Skin: Skin is warm.     Assessment/Plan  Proceed as planned with LEFT 1st stage ureteroscopy for large stones. Risks, benefits, alternatives, expected peri-op course including post-op stenting given  staged approach discussed previously and reiterated today.   [name], MD 01/12/2019, 6:39 AM

## 2019-01-12 NOTE — Anesthesia Procedure Notes (Signed)
Procedure Name: LMA Insertion Date/Time: 01/12/2019 5:55 PM Performed by: Minerva Ends, CRNA Pre-anesthesia Checklist: Patient identified, Emergency Drugs available, Suction available and Patient being monitored Patient Re-evaluated:Patient Re-evaluated prior to induction Oxygen Delivery Method: Circle System Utilized Preoxygenation: Pre-oxygenation with 100% oxygen Induction Type: IV induction Ventilation: Mask ventilation without difficulty LMA: LMA inserted and LMA with gastric port inserted LMA Size: 4.0 Number of attempts: 1 Placement Confirmation: positive ETCO2 Tube secured with: Tape Dental Injury: Teeth and Oropharynx as per pre-operative assessment  Comments: Smooth IV induction Turk-- LMA insertion AM CRNA atraumatic-- teeth and mouth as preop-- bilat BS Desmond Lope

## 2019-01-13 ENCOUNTER — Encounter: Payer: Self-pay | Admitting: *Deleted

## 2019-01-13 NOTE — Op Note (Signed)
NAME: Devin Sanders, Devin Sanders MEDICAL RECORD [id number] ACCOUNT [id number] DATE OF BIRTH:12-20-1980 FACILITY: [facility] LOCATION: [facility] PHYSICIAN:Joline Encalada Tresa Moore, MD  OPERATIVE REPORT  DATE OF PROCEDURE:  01/12/2019  SURGEON:  Alexis Frock, MD   PREOPERATIVE DIAGNOSES:   1.  Recurrent left renal stone. 2.  Cystinuria.  PROCEDURE: 1.  Cystoscopy, left retrograde pyelogram with interpretation. 2.  Left ureteroscopy with laser lithotripsy, first-stage. 3.  Left ureteral stent placement 5 x 24 Polaris, no tether.  ESTIMATED BLOOD LOSS:  Nil.  COMPLICATIONS:  None.  SPECIMENS:  None.  FINDINGS: 1.  Large volume left renal pelvis stone with likely intermittent ball-valving obstruction.  Estimated volume 2.5 cm. 2.  Ablation of approximately 60% of left renal stone today with first-stage procedure. 3.  Placement of left ureteral stent, proximal end in renal pelvis, distal end in urinary bladder.  INDICATIONS:  The patient is a very pleasant and compliant, but unfortunate 39 year old male with history of cystinuria.  He has had innumerable surgical procedures in the past, including most recently a protocol of ureteroscopy every 4-6 months to avoid  renal compromise.  He unfortunately has not had an evaluation in approximately 10 months and then presented earlier this week with colicky flank pain as is typical for him and CT imaging in the emergency room revealed very large volume left renal stone,  approximately 2.5 cm with likely intermittent ball-valving obstruction.  Fortunately, the right side stone free.  Options were discussed, including recommended path of staged ureteroscopy with first-stage procedure in the near future given intermittent  obstruction and effort to maximize his long-term renal preservation and he wished to proceed.  He presents for first-stage procedure today.  Informed consent was then placed and obtained in the medical record.  DESCRIPTION OF PROCEDURE:  The  patient being identified, the procedure being left first-stage ureteroscopic stimulation was confirmed.  Procedure timeout was performed.  IV antibiotics were administered.  General anesthesia induced.  The patient was  placed into a low lithotomy position.  Sterile field was created, prepping and draping his penis, perineum, proximal thighs using iodine.  Cystourethroscopy was performed using 21-French rigid cystoscope with offset lens.  Inspection of the anterior and  posterior is unremarkable.  Inspection of bladder revealed crystal area consistent with known cystinuria.  The left ureteral orifice was cannulated with 6-French renal catheter and left retrograde pyelogram was obtained.  Left retrograde pyelogram demonstrates a single left ureter and single system left kidney.  There were no filling defects in the ureter, but there was a large filling defect in the renal pelvis that appeared mobile, consistent with a known stone.  A  0.038 Sensor wire was advanced to lower pole and set aside as a safety wire.  An 8-French feeding tube was placed in the urinary bladder, pressure released and semi-rigid ureteroscopy performed of the distal four-fifths of the left ureter alongside a  separate Sensor working wire.  No mucosal abnormalities were found.  The semi-rigid scope was exchanged for a 12/14, 35 cm ureteral access sheath up to the level of the proximal ureter.  Using continuous fluoroscopic guidance and flexible digital  ureteroscopy was performed of the proximal left ureter and systematic inspection of the left kidney.  As expected, there was a very large volume intrarenal stone on the left, including a dominant approximately 2 cm stone and several other smaller lower  pole calcifications that appeared to be much too large for simple basketing.  The dominant renal pelvis stone was positioned into an upper  infundibulum for less acute angulation and using a 200 nanometer fiber holmium laser energy was  applied at a  setting of 0.3 joules and 30 Hz.  Using a dusting technique, ablation of stone was achieved for approximately an hour.  With this, there was ablation of approximately 70% of this stone.  This was quite efficient and fortunate.  This volume of stone in  dusting technique that inherently generated innumerable dust fragments such that visualization became quite poor and as anticipated, it was felt that he would benefit from a second-stage procedure as planned in approximately 3 weeks.  We achieved the  goals of the first-stage procedure today.  The access sheath was removed under continuous vision.  No mucosal abnormalities were found  and a new 5 x 24 Polaris-type stent was placed over the remaining Sensor safety wire using fluoroscopic guidance.   Good proximal and distal planes were noted and the procedure was terminated.  The patient tolerated the procedure well.  No immediate perioperative complications.  The patient was taken to postanesthesia care unit in stable condition with plan for  discharge home.  VN/NUANCE  D:01/12/2019 T:01/12/2019 JOB:[id number]

## 2019-01-13 NOTE — Anesthesia Postprocedure Evaluation (Signed)
Anesthesia Post Note  Patient: Devin Sanders  Procedure(s) Performed: CYSTOSCOPY WITH RETROGRADE PYELOGRAM, URETEROSCOPY AND STENT PLACEMENT (Left Ureter) HOLMIUM LASER APPLICATION (Left Ureter)     Patient location during evaluation: PACU Anesthesia Type: General Level of consciousness: awake and alert, awake and oriented Pain management: pain level controlled Vital Signs Assessment: post-procedure vital signs reviewed and stable Respiratory status: spontaneous breathing, nonlabored ventilation and respiratory function stable Cardiovascular status: blood pressure returned to baseline and stable Postop Assessment: no apparent nausea or vomiting Anesthetic complications: no    Last Vitals:  Vitals:   01/12/19 1945 01/12/19 2010  BP: (!) 144/90 136/88  Pulse: 99 79  Resp: 20 15  Temp:  36.9 C  SpO2: 97% 99%    Last Pain:  Vitals:   01/12/19 2010  TempSrc:   PainSc: 4                  Cecile Hearing

## 2019-01-19 ENCOUNTER — Emergency Department (HOSPITAL_COMMUNITY): Payer: 59

## 2019-01-19 ENCOUNTER — Encounter (HOSPITAL_COMMUNITY): Payer: Self-pay | Admitting: Emergency Medicine

## 2019-01-19 ENCOUNTER — Emergency Department (HOSPITAL_COMMUNITY)
Admission: EM | Admit: 2019-01-19 | Discharge: 2019-01-20 | Disposition: A | Discharge status: Home / Self Care | Payer: 59 | Attending: Emergency Medicine | Admitting: Emergency Medicine

## 2019-01-19 ENCOUNTER — Other Ambulatory Visit: Payer: Self-pay

## 2019-01-19 DIAGNOSIS — R319 Hematuria, unspecified: Secondary | ICD-10-CM | POA: Diagnosis not present

## 2019-01-19 DIAGNOSIS — N23 Unspecified renal colic: Secondary | ICD-10-CM

## 2019-01-19 DIAGNOSIS — Z960 Presence of urogenital implants: Secondary | ICD-10-CM | POA: Diagnosis not present

## 2019-01-19 DIAGNOSIS — R1032 Left lower quadrant pain: Secondary | ICD-10-CM | POA: Diagnosis present

## 2019-01-19 DIAGNOSIS — R109 Unspecified abdominal pain: Secondary | ICD-10-CM

## 2019-01-19 LAB — COMPREHENSIVE METABOLIC PANEL
ALT: 22 U/L (ref 0–44)
AST: 28 U/L (ref 15–41)
Albumin: 3.8 g/dL (ref 3.5–5.0)
Alkaline Phosphatase: 52 U/L (ref 38–126)
Anion gap: 9 (ref 5–15)
BUN: 21 mg/dL — ABNORMAL HIGH (ref 6–20)
CO2: 22 mmol/L (ref 22–32)
Calcium: 9.4 mg/dL (ref 8.9–10.3)
Chloride: 106 mmol/L (ref 98–111)
Creatinine, Ser: 0.96 mg/dL (ref 0.61–1.24)
GFR calc Af Amer: >60 mL/min (ref >60)
GFR calc non Af Amer: >60 mL/min (ref >60)
Glucose, Bld: 113 mg/dL — ABNORMAL HIGH (ref 70–99)
Potassium: 5.2 mmol/L — ABNORMAL HIGH (ref 3.5–5.1)
Sodium: 137 mmol/L (ref 135–145)
Total Bilirubin: 1.4 mg/dL — ABNORMAL HIGH (ref 0.3–1.2)
Total Protein: 6.7 g/dL (ref 6.5–8.1)

## 2019-01-19 LAB — CBC WITH DIFFERENTIAL/PLATELET
Abs Immature Granulocytes: 0.02 10*3/uL (ref 0.00–0.07)
Basophils Absolute: 0 10*3/uL (ref 0.0–0.1)
Basophils Relative: 0 %
Eosinophils Absolute: 0.2 10*3/uL (ref 0.0–0.5)
Eosinophils Relative: 2 %
HCT: 42.3 % (ref 39.0–52.0)
Hemoglobin: 14.4 g/dL (ref 13.0–17.0)
Immature Granulocytes: 0 %
Lymphocytes Relative: 19 %
Lymphs Abs: 1.5 10*3/uL (ref 0.7–4.0)
MCH: 33.8 pg (ref 26.0–34.0)
MCHC: 34 g/dL (ref 30.0–36.0)
MCV: 99.3 fL (ref 80.0–100.0)
Monocytes Absolute: 0.7 10*3/uL (ref 0.1–1.0)
Monocytes Relative: 9 %
Neutro Abs: 5.7 10*3/uL (ref 1.7–7.7)
Neutrophils Relative %: 70 %
Platelets: 275 10*3/uL (ref 150–400)
RBC: 4.26 MIL/uL (ref 4.22–5.81)
RDW: 11.4 % — ABNORMAL LOW (ref 11.5–15.5)
WBC: 8.1 10*3/uL (ref 4.0–10.5)
nRBC: 0 % (ref 0.0–0.2)

## 2019-01-19 LAB — URINALYSIS, ROUTINE W REFLEX MICROSCOPIC
Bilirubin Urine: NEGATIVE
Cystine Crys, UA: POSITIVE
Glucose, UA: NEGATIVE mg/dL
Ketones, ur: NEGATIVE mg/dL
Nitrite: NEGATIVE
Protein, ur: >=300 mg/dL — AB
RBC / HPF: >50 RBC/hpf — ABNORMAL HIGH (ref 0–5)
Specific Gravity, Urine: 1.027 (ref 1.005–1.030)
pH: 6 (ref 5.0–8.0)

## 2019-01-19 MED ORDER — KETOROLAC TROMETHAMINE 30 MG/ML IJ SOLN
30.0000 mg | Freq: Once | INTRAMUSCULAR | Status: AC
  Administered 2019-01-19: 30 mg via INTRAVENOUS
  Filled 2019-01-19: qty 1

## 2019-01-19 MED ORDER — ONDANSETRON HCL 4 MG/2ML IJ SOLN
4.0000 mg | Freq: Once | INTRAMUSCULAR | Status: AC
  Administered 2019-01-19: 4 mg via INTRAVENOUS
  Filled 2019-01-19: qty 2

## 2019-01-19 MED ORDER — SODIUM CHLORIDE 0.9 % IV BOLUS
1000.0000 mL | Freq: Once | INTRAVENOUS | Status: AC
  Administered 2019-01-19: 1000 mL via INTRAVENOUS

## 2019-01-19 MED ORDER — DIPHENHYDRAMINE HCL 50 MG/ML IJ SOLN
25.0000 mg | Freq: Once | INTRAMUSCULAR | Status: AC
  Administered 2019-01-20: 25 mg via INTRAVENOUS
  Filled 2019-01-19: qty 1

## 2019-01-19 MED ORDER — HYDROMORPHONE HCL 1 MG/ML IJ SOLN
1.0000 mg | Freq: Once | INTRAMUSCULAR | Status: AC
  Administered 2019-01-19: 1 mg via INTRAVENOUS
  Filled 2019-01-19: qty 1

## 2019-01-19 NOTE — ED Triage Notes (Addendum)
Patient c/o left flank pain. States lithotripsy and stent placement last week. Reports taking toradol at 1200 but cannot get pain under control. Patient denies changes in urinating.

## 2019-01-19 NOTE — ED Notes (Signed)
Patient in room 3, ultrasound at bedside.

## 2019-01-19 NOTE — ED Provider Notes (Signed)
[facility]-EMERGENCY DEPT Provider Note   CSN: [id number] Arrival date & time: 01/19/19  1955     History Chief Complaint  Patient presents with  . Flank Pain    Devin Sanders is a 39 y.o. male hx of cystinuria with recurrent kidney stones, here presenting with left flank pain .  Patient had a lithotripsy and ureteral stone placement done on 1/6 by Dr. Berneice Heinrich.  Patient has been taking Toradol for pain.  Since yesterday he had sudden onset of worsening left flank pain.  Been taking his Toradol with no relief.  He states that this happened before and often times they had to replace the stent.  The history is provided by the patient.       Past Medical History:  Diagnosis Date  . GERD (gastroesophageal reflux disease)    pt denied  . History of kidney stones   . 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 Wears glasses     Patient Active Problem List   Diagnosis Date Noted  . Hydronephrosis of right kidney 09/28/2017    Past Surgical History:  Procedure Laterality Date  . CYSTOSCOPY WITH RETROGRADE PYELOGRAM, URETEROSCOPY AND STENT PLACEMENT Bilateral 06/26/2017   Procedure: CYSTOSCOPY WITH RETROGRADE PYELOGRAM, URETEROSCOPY, STONE BASKETRY  AND STENT PLACEMENT;  Surgeon: [name], MD;  Location[facility];  Service: Urology;  Laterality: Bilateral;  . CYSTOSCOPY WITH RETROGRADE PYELOGRAM, URETEROSCOPY AND STENT PLACEMENT Right 09/18/2017   Procedure: CYSTOSCOPY WITH RETROGRADE PYELOGRAM, URETEROSCOPY AND STENT PLACEMENT;  Surgeon: Crist Fat, MD;  Location: [facility];  Service: Urology;  Laterality: Right;  . CYSTOSCOPY WITH RETROGRADE PYELOGRAM, URETEROSCOPY AND STENT PLACEMENT Bilateral 10/13/2017   Procedure: CYSTOSCOPY WITH RETROGRADE PYELOGRAM, BILATERAL URETEROSCOPY AND BILATERAL STENT PLACEMENT, LASER;  Surgeon: [name], MD;  Location: [facility];  Service: Urology;  Laterality: Bilateral;  90  MINS  . CYSTOSCOPY WITH RETROGRADE PYELOGRAM, URETEROSCOPY AND STENT PLACEMENT Bilateral 03/24/2018   Procedure: CYSTOSCOPY WITH RETROGRADE PYELOGRAM, URETEROSCOPY AND STENT PLACEMENT;  Surgeon: [name], MD;  Location[facility];  Service: Urology;  Laterality: Bilateral;  1 HR  . CYSTOSCOPY WITH RETROGRADE PYELOGRAM, URETEROSCOPY AND STENT PLACEMENT Bilateral 07/07/2018   Procedure: CYSTOSCOPY WITH RETROGRADE PYELOGRAM, URETEROSCOPY AND STENT PLACEMENT;  Surgeon: [name], MD;  Location[facility];  Service: Urology;  Laterality: Bilateral;  75 MINUTES  . CYSTOSCOPY WITH RETROGRADE PYELOGRAM, URETEROSCOPY AND STENT PLACEMENT Left 01/12/2019   Procedure: CYSTOSCOPY WITH RETROGRADE PYELOGRAM, URETEROSCOPY AND STENT PLACEMENT;  Surgeon: [name], MD;  Location[facility];  Service: Urology;  Laterality: Left;  90 MINS  . CYSTOSCOPY/RETROGRADE/URETEROSCOPY/STONE EXTRACTION WITH BASKET Left 10/05/2017   Procedure: CYSTOSCOPY/RETROGRADE/STONE REMOVAL FROM BLADDER ;  Surgeon: Bjorn Pippin, MD;  [facility];  Service: Urology;  Laterality: Left;  . CYSTOSCOPY/URETEROSCOPY/HOLMIUM LASER/STENT PLACEMENT Right 09/28/2017   Procedure: CYSTOSCOPY/RIGHT RETROGRADE URETEROSCOPY/HOLMIUM LASER/ RIGHT STENT PLACEMENT;  Surgeon: Rene Paci, MD;  [facility];  Service: Urology;  Laterality: Right;  . CYSTOSCOPY/URETEROSCOPY/HOLMIUM LASER/STENT PLACEMENT Bilateral 01/27/2018   Procedure: CYSTOSCOPY/URETEROSCOPY/HOLMIUM LASER/STENT PLACEMENT;  Surgeon: [name], MD;  Location: [facility];  Service: Urology;  Laterality: Bilateral;  . HOLMIUM LASER APPLICATION Bilateral 06/26/2017   Procedure: HOLMIUM LASER APPLICATION;  Surgeon: [name], MD;  Location: [facility];  Service: Urology;  Laterality: Bilateral;  . HOLMIUM LASER APPLICATION Right 09/18/2017   Procedure: HOLMIUM LASER APPLICATION;  Surgeon: Crist Fat, MD;  [facility];  Service: Urology;  Laterality: Right;  . HOLMIUM LASER APPLICATION Bilateral 10/13/2017   Procedure: HOLMIUM LASER APPLICATION;  Surgeon: [name], MD;  [facility];  Service: Urology;  Laterality: Bilateral;  . HOLMIUM LASER APPLICATION Bilateral 01/27/2018   Procedure: HOLMIUM LASER APPLICATION;  Surgeon: [name], MD;  Location: [facility];  Service: Urology;  Laterality: Bilateral;  . HOLMIUM LASER APPLICATION Bilateral 03/24/2018   Procedure: HOLMIUM LASER APPLICATION;  Surgeon: [name], MD;  [facility];  Service: Urology;  Laterality: Bilateral;  . HOLMIUM LASER APPLICATION Bilateral 07/07/2018   Procedure: HOLMIUM LASER APPLICATION;  Surgeon: [name], MD;  [facility];  Service: Urology;  Laterality: Bilateral;  . HOLMIUM LASER APPLICATION Left 01/12/2019   Procedure: HOLMIUM LASER APPLICATION;  Surgeon: [name], MD;  [facility];  Service: Urology;  Laterality: Left;  . PERCUTANEOUS NEPHROSTOLITHOTOMY  2003;  2009;  06-22-2014  @ [facility]  . URETEROSCOPIC STONE MANIPULATION UNILATERAL  multiple since 1997--  last one 02-19-2017  @ [facility] by dr [email or web address]       No family history on file.  Social History   Tobacco Use  . Smoking status: Never Smoker  . Smokeless tobacco: Never Used  Substance Use Topics  . Alcohol use: Never  . Drug use: Never    Home Medications Prior to Admission medications   Medication Sig Start Date End Date Taking? Authorizing Provider  ibuprofen (ADVIL) 200 MG tablet Take 400 mg by mouth every 6 (six) hours as needed for moderate pain.   Yes [provider]  ketorolac (TORADOL) 10 MG tablet Take 10 mg by mouth every 6 (six) hours as needed for pain. 01/17/19  Yes [provider]  Potassium Citrate 15 MEQ (1620 MG) TBCR Take 2 tablets by mouth 2 (two) times daily. 07/14/18  Yes [provider]  HYDROcodone-acetaminophen (NORCO/VICODIN) 5-325 MG tablet Take 1  tablet by mouth every 6 (six) hours as needed for severe pain. Patient not taking: Reported on 01/19/2019 09/23/18   Caccavale, Sophia, PA-C  ondansetron (ZOFRAN ODT) 8 MG disintegrating tablet Take 1 tablet (8 mg total) by mouth every 8 (eight) hours as needed for nausea or vomiting. Patient not taking: Reported on 01/19/2019 01/09/19   Margarita Grizzle, MD    Allergies    Morphine and Other  Review of Systems   Review of Systems  Genitourinary: Positive for flank pain.  All other systems reviewed and are negative.   Physical Exam Updated Vital Signs BP (!) 173/116   Pulse (!) 109   Temp 98.7 F (37.1 C)   Resp (!) 22   SpO2 99%   Physical Exam Vitals and nursing note reviewed.  HENT:     Head: Normocephalic.     Mouth/Throat:     Mouth: Mucous membranes are moist.  Eyes:     Extraocular Movements: Extraocular movements intact.     Pupils: Pupils are equal, round, and reactive to light.  Cardiovascular:     Rate and Rhythm: Normal rate and regular rhythm.     Pulses: Normal pulses.     Heart sounds: Normal heart sounds.  Pulmonary:     Effort: Pulmonary effort is normal.     Breath sounds: Normal breath sounds.  Abdominal:     General: Abdomen is flat.     Comments: + L CVAT   Musculoskeletal:        General: Normal range of motion.     Cervical back: Normal range of  motion and neck supple.  Skin:    General: Skin is warm.     Capillary Refill: Capillary refill takes less than 2 seconds.  Neurological:     General: No focal deficit present.     Mental Status: He is alert and oriented to person, place, and time.  Psychiatric:        Mood and Affect: Mood normal.        Behavior: Behavior normal.     ED Results / Procedures / Treatments   Labs (all labs ordered are listed, but only abnormal results are displayed) Labs Reviewed  URINALYSIS, ROUTINE W REFLEX MICROSCOPIC - Abnormal; Notable for the following components:      Result Value   Color, Urine BROWN (*)      APPearance CLOUDY (*)    Hgb urine dipstick LARGE (*)    Protein, ur >=300 (*)    Leukocytes,Ua TRACE (*)    RBC / HPF >50 (*)    Bacteria, UA FEW (*)    Crystals PRESENT (*)    All other components within normal limits  CBC WITH DIFFERENTIAL/PLATELET - Abnormal; Notable for the following components:   RDW 11.4 (*)    All other components within normal limits  COMPREHENSIVE METABOLIC PANEL - Abnormal; Notable for the following components:   Potassium 5.2 (*)    Glucose, Bld 113 (*)    BUN 21 (*)    Total Bilirubin 1.4 (*)    All other components within normal limits  RESPIRATORY PANEL BY RT PCR (FLU A&B, COVID)  URINE CULTURE    EKG None  Radiology DG Abdomen 1 View  Result Date: 01/19/2019 CLINICAL DATA:  Increased pain history of lithotripsy and stent placement EXAM: ABDOMEN - 1 VIEW COMPARISON:  11/06/2017, CT 01/09/2019 FINDINGS: Nonobstructed gas pattern. No definitive radiopaque calculi on the current exam. Left-sided ureteral stent with proximal pigtail over expected location of left kidney. Distal loop of the stent in the pelvis, cephalad to the pubic symphysis. Phleboliths in the left pelvis. IMPRESSION: Left ureteral stent in place.  Nonobstructed gas pattern Electronically Signed   By: Donavan Foil M.D.   On: 01/19/2019 21:48   US Renal  Result Date: 01/19/2019 CLINICAL DATA:  Left flank pain, recent lithotripsy and stent placement, rule out hydronephrosis EXAM: RENAL / URINARY TRACT ULTRASOUND COMPLETE COMPARISON:  Abdominal radiograph 75/17/0017, CT renal colic 49/44/9675 FINDINGS: Right Kidney: Renal measurements: 12.6 x 5.7 x 5.0 cm = volume: 187 mL. Anechoic simple cyst in the upper pole right kidney measuring 1.3 x 1.0 x 0.9 cm. Normal renal echogenicity. No worrisome renal lesion. No visible calcification. No hydronephrosis. Left Kidney: Renal measurements: 12.5 x 5.8 x 5.4 cm = volume: 206.3 mL. Normal cortical echogenicity. Lobular surface contour likely  reflecting some diffuse renal scarring. Several echogenic foci are again seen in the lower pole left kidney which may reflect residual calcifications visualized on comparison CT from 01/09/2019. Proximal nephroureteral stent is visualized in the left renal pelvis. No worrisome renal lesion. No visible hydronephrosis. Bladder: Partially decompressed at the time of exam. Distal stent tip is positioned within the bladder lumen. No visible bladder calculi. Other: None. IMPRESSION: Nephroureteral stent position proximally in the left renal pelvis and distally within the urinary bladder. Probable nonobstructing calculus in the lower pole left kidney corresponding well to location seen on comparison CT though is nonshadowing on sonography. Left renal scarring, similar to comparison CT. 1.3 cm simple cyst in the right kidney. Electronically Signed  By: Kreg Shropshire M.D.   On: 01/19/2019 23:22    Procedures Procedures (including critical care time)  Medications Ordered in ED Medications  HYDROmorphone (DILAUDID) injection 1 mg (has no administration in time range)  sodium chloride 0.9 % bolus 1,000 mL (1,000 mLs Intravenous Bolus from Bag 01/19/19 2233)  HYDROmorphone (DILAUDID) injection 1 mg (1 mg Intravenous Given 01/19/19 2232)  ondansetron (ZOFRAN) injection 4 mg (4 mg Intravenous Given 01/19/19 2232)  ketorolac (TORADOL) 30 MG/ML injection 30 mg (30 mg Intravenous Given 01/19/19 2232)    ED Course  I have reviewed the triage vital signs and the nursing notes.  Pertinent labs & imaging results that were available during my care of the patient were reviewed by me and considered in my medical decision making (see chart for details).    MDM Rules/Calculators/A&P                     Devin Sanders is a 39 y.o. male here presenting with left flank pain.  Patient had recent ureteral stent placement.  Will get x-rays to confirm placement and get chemistry and give pain medicine IV fluids and urinalysis .   Will reassess after pain meds.  11:33 PM Patient still has quite a bit of pain.  His urine shows hematuria.  Creatinine is baseline and his stent is in place but he still has nonobstructing stones.  Patient received 2 doses of pain meds and Toradol with minimal relief.  With Dr. Dr. Laverle Patter from urology. He states that since he has nonobstructing stones and the stent is in place, he does not recommend any further intervention currently .  He states that if we cannot control his pain, then he will admit the patient for pain control.  12:22 AM Pain improved after third dose. Patient wants to go home. Will prescribe pain meds. He has follow up with Dr. Berneice Heinrich in the office. Has cystoscopy scheduled in 2 weeks.     Final Clinical Impression(s) / ED Diagnoses Final diagnoses:  None    Rx / DC Orders ED Discharge Orders    None       Charlynne Pander, MD 01/20/19 [id number]

## 2019-01-20 MED ORDER — HYDROMORPHONE HCL 1 MG/ML IJ SOLN
1.0000 mg | Freq: Once | INTRAMUSCULAR | Status: AC
  Administered 2019-01-20: 1 mg via INTRAVENOUS
  Filled 2019-01-20: qty 1

## 2019-01-20 MED ORDER — DIPHENHYDRAMINE HCL 50 MG/ML IJ SOLN
25.0000 mg | Freq: Once | INTRAMUSCULAR | Status: AC
  Administered 2019-01-20: 25 mg via INTRAVENOUS
  Filled 2019-01-20: qty 1

## 2019-01-20 MED ORDER — OXYCODONE-ACETAMINOPHEN 5-325 MG PO TABS: 2.0000 | ORAL_TABLET | ORAL | 0 refills | Status: DC | PRN

## 2019-01-20 NOTE — Discharge Instructions (Signed)
Continue toradol or motrin for pain   Take percocet for severe pain   See urology for follow up   Return to ER if you have worse flank pain, vomiting, fever.

## 2019-01-21 LAB — URINE CULTURE: Culture: NO GROWTH

## 2019-01-24 ENCOUNTER — Other Ambulatory Visit: Payer: Self-pay

## 2019-01-24 ENCOUNTER — Encounter (HOSPITAL_BASED_OUTPATIENT_CLINIC_OR_DEPARTMENT_OTHER): Payer: Self-pay | Admitting: Urology

## 2019-01-24 NOTE — Progress Notes (Addendum)
ADDENDUM:  Pt called via phone today asking for advise. Pt stated he is having more blooding urine today than usual and a lot of blood clots.  Advised pt he needs to call dr Berneice Heinrich office and speak with the triage nurse to report to dr Berneice Heinrich today and get advise from dr Berneice Heinrich, pt verbalized understanding.   Spoke w/ via phone for pre-op interview--- PT Lab needs dos----   No           Lab results------  Dated 01-19-2019 CBCdiff, CMP in chart/ epic COVID test ------ 01-25-2019 @ 1455 Arrive at ------- 1230 NPO after ------ MN w/ exception clear liquids until 0830 then nothing by mouth (no cream/ milk proucts) Medications to take morning of surgery ----- Oxycodone/ zofran if needed w/ sips of water Diabetic medication ----- n/a Patient Special Instructions ----- if still in severe pain after taking oxycodone dos may arrived eariler in order to get IV started and IV pain meds . Pre-Op special Istructions ----- n/a Patient verbalized understanding of instructions that were given at this phone interview. Patient denies shortness of breath, chest pain, fever, cough a this phone interview.

## 2019-01-25 ENCOUNTER — Other Ambulatory Visit (HOSPITAL_COMMUNITY)
Admission: RE | Admit: 2019-01-25 | Discharge: 2019-01-25 | Disposition: A | Discharge status: Home / Self Care | Payer: 59 | Source: Ambulatory Visit | Attending: Urology | Admitting: Urology

## 2019-01-25 DIAGNOSIS — Z01812 Encounter for preprocedural laboratory examination: Secondary | ICD-10-CM | POA: Insufficient documentation

## 2019-01-25 DIAGNOSIS — Z20822 Contact with and (suspected) exposure to covid-19: Secondary | ICD-10-CM | POA: Insufficient documentation

## 2019-01-26 LAB — NOVEL CORONAVIRUS, NAA (HOSP ORDER, SEND-OUT TO REF LAB; TAT 18-24 HRS): SARS-CoV-2, NAA: NOT DETECTED

## 2019-01-28 ENCOUNTER — Ambulatory Visit (HOSPITAL_BASED_OUTPATIENT_CLINIC_OR_DEPARTMENT_OTHER)
Admission: RE | Admit: 2019-01-28 | Discharge: 2019-01-28 | Disposition: A | Discharge status: Home / Self Care | Payer: 59 | Attending: Urology | Admitting: Urology

## 2019-01-28 ENCOUNTER — Other Ambulatory Visit: Payer: Self-pay

## 2019-01-28 ENCOUNTER — Encounter (HOSPITAL_BASED_OUTPATIENT_CLINIC_OR_DEPARTMENT_OTHER): Payer: Self-pay | Admitting: Urology

## 2019-01-28 ENCOUNTER — Ambulatory Visit (HOSPITAL_BASED_OUTPATIENT_CLINIC_OR_DEPARTMENT_OTHER): Payer: 59 | Admitting: Anesthesiology

## 2019-01-28 ENCOUNTER — Encounter (HOSPITAL_BASED_OUTPATIENT_CLINIC_OR_DEPARTMENT_OTHER)
Admission: RE | Disposition: A | Discharge status: Home / Self Care | Payer: Self-pay | Source: Home / Self Care | Attending: Urology

## 2019-01-28 DIAGNOSIS — Z885 Allergy status to narcotic agent status: Secondary | ICD-10-CM | POA: Diagnosis not present

## 2019-01-28 DIAGNOSIS — Z6832 Body mass index (BMI) 32.0-32.9, adult: Secondary | ICD-10-CM | POA: Insufficient documentation

## 2019-01-28 DIAGNOSIS — E669 Obesity, unspecified: Secondary | ICD-10-CM | POA: Diagnosis not present

## 2019-01-28 DIAGNOSIS — E7201 Cystinuria: Secondary | ICD-10-CM | POA: Insufficient documentation

## 2019-01-28 DIAGNOSIS — N202 Calculus of kidney with calculus of ureter: Secondary | ICD-10-CM | POA: Diagnosis not present

## 2019-01-28 DIAGNOSIS — Z91018 Allergy to other foods: Secondary | ICD-10-CM | POA: Diagnosis not present

## 2019-01-28 DIAGNOSIS — Z87442 Personal history of urinary calculi: Secondary | ICD-10-CM | POA: Insufficient documentation

## 2019-01-28 HISTORY — DX: Calculus of kidney: N20.0

## 2019-01-28 HISTORY — DX: Calculus of ureter: N20.1

## 2019-01-28 HISTORY — PX: CYSTOSCOPY WITH RETROGRADE PYELOGRAM, URETEROSCOPY AND STENT PLACEMENT: SHX5789

## 2019-01-28 HISTORY — DX: Cystinuria: E72.01

## 2019-01-28 SURGERY — CYSTOURETEROSCOPY, WITH RETROGRADE PYELOGRAM AND STENT INSERTION
Anesthesia: General | Laterality: Left

## 2019-01-28 MED ORDER — DEXAMETHASONE SODIUM PHOSPHATE 10 MG/ML IJ SOLN
INTRAMUSCULAR | Status: AC
  Filled 2019-01-28: qty 1

## 2019-01-28 MED ORDER — KETOROLAC TROMETHAMINE 10 MG PO TABS: 10.0000 mg | ORAL_TABLET | Freq: Three times a day (TID) | ORAL | 0 refills | Status: DC | PRN

## 2019-01-28 MED ORDER — IOHEXOL 300 MG/ML  SOLN
INTRAMUSCULAR | Status: DC | PRN
  Administered 2019-01-28: 20 mL via URETHRAL

## 2019-01-28 MED ORDER — KETOROLAC TROMETHAMINE 30 MG/ML IJ SOLN
INTRAMUSCULAR | Status: AC
  Filled 2019-01-28: qty 1

## 2019-01-28 MED ORDER — FENTANYL CITRATE (PF) 100 MCG/2ML IJ SOLN
INTRAMUSCULAR | Status: DC | PRN
  Administered 2019-01-28: 50 ug via INTRAVENOUS
  Administered 2019-01-28 (×2): 25 ug via INTRAVENOUS

## 2019-01-28 MED ORDER — ONDANSETRON HCL 4 MG/2ML IJ SOLN
INTRAMUSCULAR | Status: DC | PRN
  Administered 2019-01-28: 4 mg via INTRAVENOUS

## 2019-01-28 MED ORDER — SODIUM CHLORIDE 0.9 % IV SOLN
INTRAVENOUS | Status: DC
  Filled 2019-01-28: qty 1000

## 2019-01-28 MED ORDER — FENTANYL CITRATE (PF) 100 MCG/2ML IJ SOLN
INTRAMUSCULAR | Status: AC
  Filled 2019-01-28: qty 2

## 2019-01-28 MED ORDER — ONDANSETRON HCL 4 MG/2ML IJ SOLN
INTRAMUSCULAR | Status: AC
  Filled 2019-01-28: qty 2

## 2019-01-28 MED ORDER — LIDOCAINE 2% (20 MG/ML) 5 ML SYRINGE
INTRAMUSCULAR | Status: DC | PRN
  Administered 2019-01-28: 100 mg via INTRAVENOUS

## 2019-01-28 MED ORDER — SODIUM CHLORIDE 0.9 % IV SOLN
1.0000 g | INTRAVENOUS | Status: AC
  Administered 2019-01-28: 2 g via INTRAVENOUS
  Filled 2019-01-28: qty 10

## 2019-01-28 MED ORDER — DIPHENHYDRAMINE HCL 50 MG/ML IJ SOLN
12.5000 mg | Freq: Once | INTRAMUSCULAR | Status: AC
  Administered 2019-01-28: 12.5 mg via INTRAVENOUS
  Filled 2019-01-28: qty 0.25

## 2019-01-28 MED ORDER — LIDOCAINE 2% (20 MG/ML) 5 ML SYRINGE
INTRAMUSCULAR | Status: AC
  Filled 2019-01-28: qty 5

## 2019-01-28 MED ORDER — FENTANYL CITRATE (PF) 100 MCG/2ML IJ SOLN
100.0000 ug | Freq: Once | INTRAMUSCULAR | Status: AC
  Administered 2019-01-28: 100 ug via INTRAVENOUS
  Filled 2019-01-28: qty 2

## 2019-01-28 MED ORDER — ACETAMINOPHEN 500 MG PO TABS
ORAL_TABLET | ORAL | Status: AC
  Filled 2019-01-28: qty 2

## 2019-01-28 MED ORDER — DEXAMETHASONE SODIUM PHOSPHATE 10 MG/ML IJ SOLN
INTRAMUSCULAR | Status: DC | PRN
  Administered 2019-01-28 (×2): 5 mg via INTRAVENOUS

## 2019-01-28 MED ORDER — MIDAZOLAM HCL 2 MG/2ML IJ SOLN
INTRAMUSCULAR | Status: AC
  Filled 2019-01-28: qty 2

## 2019-01-28 MED ORDER — KETOROLAC TROMETHAMINE 30 MG/ML IJ SOLN
INTRAMUSCULAR | Status: DC | PRN
  Administered 2019-01-28: 30 mg via INTRAVENOUS

## 2019-01-28 MED ORDER — OXYCODONE-ACETAMINOPHEN 5-325 MG PO TABS
1.0000 | ORAL_TABLET | Freq: Once | ORAL | Status: AC
  Administered 2019-01-28: 1 via ORAL
  Filled 2019-01-28: qty 1

## 2019-01-28 MED ORDER — DIPHENHYDRAMINE HCL 50 MG/ML IJ SOLN
INTRAMUSCULAR | Status: AC
  Filled 2019-01-28: qty 1

## 2019-01-28 MED ORDER — PROMETHAZINE HCL 25 MG/ML IJ SOLN
6.2500 mg | INTRAMUSCULAR | Status: DC | PRN
  Filled 2019-01-28: qty 1

## 2019-01-28 MED ORDER — CEPHALEXIN 500 MG PO CAPS: 500.0000 mg | ORAL_CAPSULE | Freq: Two times a day (BID) | ORAL | 0 refills | Status: DC

## 2019-01-28 MED ORDER — SODIUM CHLORIDE 0.9 % IV SOLN
INTRAVENOUS | Status: AC
  Filled 2019-01-28: qty 100

## 2019-01-28 MED ORDER — CEFTRIAXONE SODIUM 2 G IJ SOLR
INTRAMUSCULAR | Status: AC
  Filled 2019-01-28: qty 20

## 2019-01-28 MED ORDER — PROPOFOL 10 MG/ML IV BOLUS
INTRAVENOUS | Status: DC | PRN
  Administered 2019-01-28: 200 mg via INTRAVENOUS

## 2019-01-28 MED ORDER — ACETAMINOPHEN 500 MG PO TABS
1000.0000 mg | ORAL_TABLET | Freq: Once | ORAL | Status: AC
  Administered 2019-01-28: 1000 mg via ORAL
  Filled 2019-01-28: qty 2

## 2019-01-28 MED ORDER — OXYCODONE-ACETAMINOPHEN 5-325 MG PO TABS: 2.0000 | ORAL_TABLET | ORAL | 0 refills | Status: DC | PRN

## 2019-01-28 MED ORDER — FENTANYL CITRATE (PF) 100 MCG/2ML IJ SOLN
25.0000 ug | INTRAMUSCULAR | Status: DC | PRN
  Administered 2019-01-28: 25 ug via INTRAVENOUS
  Filled 2019-01-28: qty 1

## 2019-01-28 MED ORDER — MIDAZOLAM HCL 2 MG/2ML IJ SOLN
INTRAMUSCULAR | Status: DC | PRN
  Administered 2019-01-28: 2 mg via INTRAVENOUS

## 2019-01-28 MED ORDER — OXYCODONE-ACETAMINOPHEN 5-325 MG PO TABS
ORAL_TABLET | ORAL | Status: AC
  Filled 2019-01-28: qty 1

## 2019-01-28 SURGICAL SUPPLY — 29 items
APL SKNCLS STERI-STRIP NONHPOA (GAUZE/BANDAGES/DRESSINGS) ×1
BAG DRAIN URO-CYSTO SKYTR STRL (DRAIN) ×2 IMPLANT
BAG DRN UROCATH (DRAIN) ×1
BASKET LASER NITINOL 1.9FR (BASKET) IMPLANT
BASKET STONE NCOMPASS (UROLOGICAL SUPPLIES) ×1 IMPLANT
BENZOIN TINCTURE PRP APPL 2/3 (GAUZE/BANDAGES/DRESSINGS) ×1 IMPLANT
BSKT STON RTRVL 120 1.9FR (BASKET)
CATH INTERMIT  6FR 70CM (CATHETERS) IMPLANT
CLOTH BEACON ORANGE TIMEOUT ST (SAFETY) ×2 IMPLANT
DRSG TEGADERM 2-3/8X2-3/4 SM (GAUZE/BANDAGES/DRESSINGS) ×1 IMPLANT
FIBER LASER FLEXIVA 365 (UROLOGICAL SUPPLIES) IMPLANT
FIBER LASER TRAC TIP (UROLOGICAL SUPPLIES) IMPLANT
GLOVE BIO SURGEON STRL SZ7.5 (GLOVE) ×2 IMPLANT
GOWN STRL REUS W/TWL LRG LVL3 (GOWN DISPOSABLE) ×2 IMPLANT
GUIDEWIRE ANG ZIPWIRE 038X150 (WIRE) ×2 IMPLANT
GUIDEWIRE STR DUAL SENSOR (WIRE) ×2 IMPLANT
IV NS 1000ML (IV SOLUTION) ×2
IV NS 1000ML BAXH (IV SOLUTION) ×1 IMPLANT
IV NS IRRIG 3000ML ARTHROMATIC (IV SOLUTION) ×2 IMPLANT
KIT TURNOVER CYSTO (KITS) ×2 IMPLANT
MANIFOLD NEPTUNE II (INSTRUMENTS) ×2 IMPLANT
NS IRRIG 500ML POUR BTL (IV SOLUTION) ×2 IMPLANT
PACK CYSTO (CUSTOM PROCEDURE TRAY) ×2 IMPLANT
SHEATH URETERAL 12FRX35CM (MISCELLANEOUS) ×1 IMPLANT
STENT POLARIS 5FRX24 (STENTS) ×1 IMPLANT
SYR 10ML LL (SYRINGE) ×2 IMPLANT
TUBE CONNECTING 12X1/4 (SUCTIONS) ×2 IMPLANT
TUBE FEEDING 8FR 16IN STR KANG (MISCELLANEOUS) IMPLANT
TUBING UROLOGY SET (TUBING) ×2 IMPLANT

## 2019-01-28 NOTE — Anesthesia Postprocedure Evaluation (Signed)
Anesthesia Post Note  Patient: Devin Sanders  Procedure(s) Performed: CYSTOSCOPY WITH RETROGRADE PYELOGRAM, URETEROSCOPY AND STENT EXCHANGE STONE BASKET EXTRACTION  (Left )     Patient location during evaluation: PACU Anesthesia Type: General Level of consciousness: awake and alert Pain management: pain level controlled Vital Signs Assessment: post-procedure vital signs reviewed and stable Respiratory status: spontaneous breathing, nonlabored ventilation and respiratory function stable Cardiovascular status: blood pressure returned to baseline and stable Postop Assessment: no apparent nausea or vomiting Anesthetic complications: no    Last Vitals:  Vitals:   01/28/19 1600 01/28/19 1627  BP: 139/88 (!) 127/92  Pulse: 71 74  Resp: 10 12  Temp:  36.7 C  SpO2: 93% 96%    Last Pain:  Vitals:   01/28/19 1600  TempSrc:   PainSc: 6                  Cecile Hearing

## 2019-01-28 NOTE — H&P (Signed)
Devin Sanders is an 39 y.o. male.    Chief Complaint: Pre-OP Left 2nd Stage Ureteroscopy  HPI:   1 - Recurrent Nephrolithiasis -  Pre 2019 - PCNL several each side, URS x innumerable each side at [facility].  02/2017 - left URS for 1.5cm stone to stone reduction (not stone free) at [facility] per OR notes  06/2017 - bilateral URS to stone free (Jodette Wik)  09/2017 - Rt ureteroscopy / Lt medical passage Tresa Moore / Jeffie Pollock); 10/2017 - bilateral URS to stone free  01/2018 - bilateral URS to stone free  01/2019 - Left 3cm scattered renal stones, Rt stone free by ER CT. ==> Left 1st stage ureteroscoipy 01/12/19.   2 - Cysteinuria - ON thiola 500mg  BID / day PLUS K-Cit 2000MEQ BID meals for cysteinuria. CMP, CBC 02/2017 normal, Cr 0.9.   PMH sig for mild obesity. 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. His PCP is Morrie Sheldon MD.   Today " Devin Sanders " is seen to proceed with left 2nd stage ureteroscopic stone manipulation for large volume recurrent left renal / UPJ stone. No interval fevers. C19 screen negative.   Past Medical History:  Diagnosis Date  . 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 GERD (gastroesophageal reflux disease)    pt denied  . History of kidney stones   . Left ureteral calculus   . Renal calculus, left   . Wears glasses     Past Surgical History:  Procedure Laterality Date  . CYSTOSCOPY WITH RETROGRADE PYELOGRAM, URETEROSCOPY AND STENT PLACEMENT Bilateral 06/26/2017   Procedure: CYSTOSCOPY WITH RETROGRADE PYELOGRAM, URETEROSCOPY, STONE BASKETRY  AND STENT PLACEMENT;  Surgeon: Alexis Frock, MD;  Location: [facility];  Service: Urology;  Laterality: Bilateral;  . CYSTOSCOPY WITH RETROGRADE PYELOGRAM, URETEROSCOPY AND STENT PLACEMENT Right 09/18/2017   Procedure: CYSTOSCOPY WITH RETROGRADE PYELOGRAM, URETEROSCOPY AND STENT PLACEMENT;  Surgeon: Ardis Hughs, MD;  [facility];  Service: Urology;  Laterality: Right;  .  CYSTOSCOPY WITH RETROGRADE PYELOGRAM, URETEROSCOPY AND STENT PLACEMENT Bilateral 10/13/2017   Procedure: CYSTOSCOPY WITH RETROGRADE PYELOGRAM, BILATERAL URETEROSCOPY AND BILATERAL STENT PLACEMENT, LASER;  Surgeon: Alexis Frock, MD;  [facility];  Service: Urology;  Laterality: Bilateral;  90 MINS  . CYSTOSCOPY WITH RETROGRADE PYELOGRAM, URETEROSCOPY AND STENT PLACEMENT Bilateral 03/24/2018   Procedure: CYSTOSCOPY WITH RETROGRADE PYELOGRAM, URETEROSCOPY AND STENT PLACEMENT;  Surgeon: Alexis Frock, MD;  [facility];  Service: Urology;  Laterality: Bilateral;  1 HR  . CYSTOSCOPY WITH RETROGRADE PYELOGRAM, URETEROSCOPY AND STENT PLACEMENT Bilateral 07/07/2018   Procedure: CYSTOSCOPY WITH RETROGRADE PYELOGRAM, URETEROSCOPY AND STENT PLACEMENT;  Surgeon: Alexis Frock, MD;  [facility];  Service: Urology;  Laterality: Bilateral;  75 MINUTES  . CYSTOSCOPY WITH RETROGRADE PYELOGRAM, URETEROSCOPY AND STENT PLACEMENT Left 01/12/2019   Procedure: CYSTOSCOPY WITH RETROGRADE PYELOGRAM, URETEROSCOPY AND STENT PLACEMENT;  Surgeon: Alexis Frock, MD;  [facility];  Service: Urology;  Laterality: Left;  90 MINS  . CYSTOSCOPY/RETROGRADE/URETEROSCOPY/STONE EXTRACTION WITH BASKET Left 10/05/2017   Procedure: CYSTOSCOPY/RETROGRADE/STONE REMOVAL FROM BLADDER ;  Surgeon: Irine Seal, MD;  Location[facility];  Service: Urology;  Laterality: Left;  . CYSTOSCOPY/URETEROSCOPY/HOLMIUM LASER/STENT PLACEMENT Right 09/28/2017   Procedure: CYSTOSCOPY/RIGHT RETROGRADE URETEROSCOPY/HOLMIUM LASER/ RIGHT STENT PLACEMENT;  Surgeon: Ceasar Mons, MD;  [facility];  Service: Urology;  Laterality: Right;  . CYSTOSCOPY/URETEROSCOPY/HOLMIUM LASER/STENT PLACEMENT Bilateral 01/27/2018   Procedure: CYSTOSCOPY/URETEROSCOPY/HOLMIUM LASER/STENT PLACEMENT;  Surgeon: Alexis Frock, MD;  Location: [facility];  Service: Urology;  Laterality: Bilateral;  . HOLMIUM LASER APPLICATION Bilateral  06/26/2017   Procedure: HOLMIUM LASER APPLICATION;  Surgeon: [name], MD;  Location: [facility];  Service: Urology;  Laterality: Bilateral;  . HOLMIUM LASER APPLICATION Right 09/18/2017   Procedure: HOLMIUM LASER APPLICATION;  Surgeon: Crist Fat, MD;  [facility];  Service: Urology;  Laterality: Right;  . HOLMIUM LASER APPLICATION Bilateral 10/13/2017   Procedure: HOLMIUM LASER APPLICATION;  Surgeon: [name], MD;  [facility];  Service: Urology;  Laterality: Bilateral;  . HOLMIUM LASER APPLICATION Bilateral 01/27/2018   Procedure: HOLMIUM LASER APPLICATION;  Surgeon: [name], MD;  Location: [facility];  Service: Urology;  Laterality: Bilateral;  . HOLMIUM LASER APPLICATION Bilateral 03/24/2018   Procedure: HOLMIUM LASER APPLICATION;  Surgeon: [name], MD;  [facility];  Service: Urology;  Laterality: Bilateral;  . HOLMIUM LASER APPLICATION Bilateral 07/07/2018   Procedure: HOLMIUM LASER APPLICATION;  Surgeon: [name], MD;  [facility];  Service: Urology;  Laterality: Bilateral;  . HOLMIUM LASER APPLICATION Left 01/12/2019   Procedure: HOLMIUM LASER APPLICATION;  Surgeon: [name], MD;  [facility];  Service: Urology;  Laterality: Left;  . PERCUTANEOUS NEPHROSTOLITHOTOMY  2003;  2009;  06-22-2014; 01-30-2015  @ [facility]  . URETEROSCOPIC STONE MANIPULATION UNILATERAL  multiple since 1997--  last one 02-19-2017  @ [facility]    No family history on file. Social History:  reports that he has never smoked. He has never used smokeless tobacco. He reports that he does not drink alcohol or use drugs.  Allergies:  Allergies  Allergen Reactions  . Morphine Nausea And Vomiting  . Other Hives and Swelling    [place]    No medications prior to admission.    No results found for this or any previous visit (from the past 48 hour(s)). No results found.  Review of Systems  Constitutional: Negative for  chills and fever.  Genitourinary: Positive for urgency.  All other systems reviewed and are negative.   Height 5\' 10"  (1.778 m), weight 99.8 kg. Physical Exam  Constitutional: He appears well-developed.  HENT:  Head: Normocephalic.  Eyes: Pupils are equal, round, and reactive to light.  Cardiovascular: Normal rate.  Respiratory: Effort normal.  GI: Soft.  Genitourinary:    Genitourinary Comments: No CVAT at present.    Musculoskeletal:     Cervical back: Normal range of motion.  Neurological: He is alert.  Skin: Skin is warm.  Psychiatric: He has a normal mood and affect.     Assessment/Plan  Proceed as planned with LEFT 2nd stage ureteroscopy for large stones. Risks, benefits, alternatives, expected peri-op course including post-op at least brief stenting discussed previously and reiterated today.   , MD 01/28/2019, 7:14 AM

## 2019-01-28 NOTE — Anesthesia Preprocedure Evaluation (Addendum)
Anesthesia Evaluation  Patient identified by MRN, date of birth, ID band Patient awake    Reviewed: Allergy & Precautions, NPO status , Patient's Chart, lab work & pertinent test results  History of Anesthesia Complications Negative for: history of anesthetic complications  Airway Mallampati: II       Dental no notable dental hx. (+) Teeth Intact   Pulmonary neg pulmonary ROS,    Pulmonary exam normal breath sounds clear to auscultation       Cardiovascular negative cardio ROS Normal cardiovascular exam Rhythm:Regular Rate:Normal     Neuro/Psych negative neurological ROS  negative psych ROS   GI/Hepatic Neg liver ROS,   Endo/Other  negative endocrine ROS  Renal/GU Renal stones  negative genitourinary   Musculoskeletal negative musculoskeletal ROS (+)   Abdominal (+) + obese,   Peds  Hematology negative hematology ROS (+)   Anesthesia Other Findings Day of surgery medications reviewed with patient.  Reproductive/Obstetrics negative OB ROS                            Anesthesia Physical Anesthesia Plan  ASA: II  Anesthesia Plan: General   Post-op Pain Management:    Induction: Intravenous  PONV Risk Score and Plan: 2 and Treatment may vary due to age or medical condition, Ondansetron, Dexamethasone and Midazolam  Airway Management Planned: LMA  Additional Equipment: None  Intra-op Plan:   Post-operative Plan: Extubation in OR  Informed Consent: I have reviewed the patients History and Physical, chart, labs and discussed the procedure including the risks, benefits and alternatives for the proposed anesthesia with the patient or authorized representative who has indicated his/her understanding and acceptance.     Dental advisory given  Plan Discussed with: CRNA  Anesthesia Plan Comments:        Anesthesia Quick Evaluation

## 2019-01-28 NOTE — Transfer of Care (Signed)
Immediate Anesthesia Transfer of Care Note  Patient: Devin Sanders  Procedure(s) Performed: Procedure(s) (LRB): CYSTOSCOPY WITH RETROGRADE PYELOGRAM, URETEROSCOPY AND STENT EXCHANGE STONE BASKET EXTRACTION  (Left)  Patient Location: PACU  Anesthesia Type: General  Level of Consciousness: awake, oriented, sedated and patient cooperative  Airway & Oxygen Therapy: Patient Spontanous Breathing and Patient connected to face mask oxygen  Post-op Assessment: Report given to PACU RN and Post -op Vital signs reviewed and stable  Post vital signs: Reviewed and stable  Complications: No apparent anesthesia complications  Patient: Devin Sanders  Procedure(s) P  Last Vitals:  Vitals Value Taken Time  BP 141/95 01/28/19 1521  Temp    Pulse 73 01/28/19 1524  Resp 13 01/28/19 1524  SpO2 98 % 01/28/19 1524  Vitals shown include unvalidated device data.  Last Pain:  Vitals:   01/28/19 1304  TempSrc:   PainSc: 8       Patients Stated Pain Goal: 4 (01/28/19 1304)

## 2019-01-28 NOTE — Anesthesia Procedure Notes (Signed)
Procedure Name: LMA Insertion Date/Time: 01/28/2019 2:31 PM Performed by: Francie Massing, CRNA Pre-anesthesia Checklist: Patient identified, Emergency Drugs available, Suction available and Patient being monitored Patient Re-evaluated:Patient Re-evaluated prior to induction Oxygen Delivery Method: Circle system utilized Preoxygenation: Pre-oxygenation with 100% oxygen Induction Type: IV induction Ventilation: Mask ventilation without difficulty LMA: LMA inserted LMA Size: 4.0 Number of attempts: 1 Airway Equipment and Method: Bite block Placement Confirmation: positive ETCO2 Tube secured with: Tape Dental Injury: Teeth and Oropharynx as per pre-operative assessment

## 2019-01-28 NOTE — Discharge Instructions (Signed)
1 - You may have urinary urgency (bladder spasms) and bloody urine on / off with stent in place. This is normal.  2 - Remove tethered stent on Monday morning at home by pulling on string, then blue-white plastic tubging, and discarding. Office is open Monday if any problems arise.   3 - Call MD or go to ER for fever >102, severe pain / nausea / vomiting not relieved by medications, or acute change in medical status   Ureteral Stent Implantation, Care After This sheet gives you information about how to care for yourself after your procedure. Your health care provider may also give you more specific instructions. If you have problems or questions, contact your health care provider. What can I expect after the procedure? After the procedure, it is common to have:  Nausea.  Mild pain when you urinate. You may feel this pain in your lower back or lower abdomen. The pain should stop within a few minutes after you urinate. This may last for up to 1 week.  A small amount of blood in your urine for several days. Follow these instructions at home: Medicines  Take over-the-counter and prescription medicines only as told by your health care provider.  If you were prescribed an antibiotic medicine, take it as told by your health care provider. Do not stop taking the antibiotic even if you start to feel better.  Do not drive for 24 hours if you were given a sedative during your procedure.  Ask your health care provider if the medicine prescribed to you requires you to avoid driving or using heavy machinery. Activity  Rest as told by your health care provider.  Avoid sitting for a long time without moving. Get up to take short walks every 1-2 hours. This is important to improve blood flow and breathing. Ask for help if you feel weak or unsteady.  Return to your normal activities as told by your health care provider. Ask your health care provider what activities are safe for you. General  instructions   Watch for any blood in your urine. Call your health care provider if the amount of blood in your urine increases.  If you have a catheter: ? Follow instructions from your health care provider about taking care of your catheter and collection bag. ? Do not take baths, swim, or use a hot tub until your health care provider approves. Ask your health care provider if you may take showers. You may only be allowed to take sponge baths.  Drink enough fluid to keep your urine pale yellow.  Do not use any products that contain nicotine or tobacco, such as cigarettes, e-cigarettes, and chewing tobacco. These can delay healing after surgery. If you need help quitting, ask your health care provider.  Keep all follow-up visits as told by your health care provider. This is important. Contact a health care provider if:  You have pain that gets worse or does not get better with medicine, especially pain when you urinate.  You have difficulty urinating.  You feel nauseous or you vomit repeatedly during a period of more than 2 days after the procedure. Get help right away if:  Your urine is dark red or has blood clots in it.  You are leaking urine (have incontinence).  The end of the stent comes out of your urethra.  You cannot urinate.  You have sudden, sharp, or severe pain in your abdomen or lower back.  You have a fever.  You have  swelling or pain in your legs.  You have difficulty breathing. Summary  After the procedure, it is common to have mild pain when you urinate that goes away within a few minutes after you urinate. This may last for up to 1 week.  Watch for any blood in your urine. Call your health care provider if the amount of blood in your urine increases.  Take over-the-counter and prescription medicines only as told by your health care provider.  Drink enough fluid to keep your urine pale yellow. This information is not intended to replace advice given to  you by your health care provider. Make sure you discuss any questions you have with your health care provider. Document Revised: 09/29/2017 Document Reviewed: 09/30/2017 Elsevier Patient Education  2020 [employer].   Post Anesthesia Home Care Instructions  Activity: Get plenty of rest for the remainder of the day. A responsible individual must stay with you for 24 hours following the procedure.  For the next 24 hours, DO NOT: -Drive a car -Advertising copywriter -Drink alcoholic beverages -Take any medication unless instructed by your physician -Make any legal decisions or sign important papers.  Meals: Start with liquid foods such as gelatin or soup. Progress to regular foods as tolerated. Avoid greasy, spicy, heavy foods. If nausea and/or vomiting occur, drink only clear liquids until the nausea and/or vomiting subsides. Call your physician if vomiting continues.  Special Instructions/Symptoms: Your throat may feel dry or sore from the anesthesia or the breathing tube placed in your throat during surgery. If this causes discomfort, gargle with warm salt water. The discomfort should disappear within 24 hours.  If you had a scopolamine patch placed behind your ear for the management of post- operative nausea and/or vomiting:  1. The medication in the patch is effective for 72 hours, after which it should be removed.  Wrap patch in a tissue and discard in the trash. Wash hands thoroughly with soap and water. 2. You may remove the patch earlier than 72 hours if you experience unpleasant side effects which may include dry mouth, dizziness or visual disturbances. 3. Avoid touching the patch. Wash your hands with soap and water after contact with the patch.

## 2019-01-28 NOTE — Brief Op Note (Signed)
01/28/2019  3:09 PM  PATIENT:  Devin Sanders  39 y.o. male  PRE-OPERATIVE DIAGNOSIS:  LEFT RENAL AND URETERAL STONES  POST-OPERATIVE DIAGNOSIS:  LEFT RENAL AND URETERAL STONES  PROCEDURE:  Procedure(s): CYSTOSCOPY WITH RETROGRADE PYELOGRAM, URETEROSCOPY AND STENT EXCHANGE STONE BASKET EXTRACTION  (Left)  SURGEON:  Surgeon(s) and Role:    * [name], MD - Primary  PHYSICIAN ASSISTANT:   ASSISTANTS: none   ANESTHESIA:   general  EBL:  minimal   BLOOD ADMINISTERED:none  DRAINS: none   LOCAL MEDICATIONS USED:  NONE  SPECIMEN:  Source of Specimen:  left renal and ureteral stone fragmetns  DISPOSITION OF SPECIMEN:  discard  COUNTS:  YES  TOURNIQUET:  * No tourniquets in log *  DICTATION: .Other Dictation: Dictation Number [id number]  PLAN OF CARE: Discharge to home after PACU  PATIENT DISPOSITION:  PACU - hemodynamically stable.   Delay start of Pharmacological VTE agent (>24hrs) due to surgical blood loss or risk of bleeding: yes

## 2019-01-29 NOTE — Op Note (Signed)
NAME: Devin Sanders, HALL[place]BECK MEDICAL RECORD WU:[id number] ACCOUNT [id number] DATE OF BIRTH:1980/02/24 FACILITY: [facility] LOCATION: [facility] PHYSICIAN:Sammuel Blick Berneice Heinrich, MD  OPERATIVE REPORT  DATE OF PROCEDURE:  01/28/2019  PREOPERATIVE DIAGNOSIS:  Residual left ureteral stone.  PROCEDURE: 1.  Cystoscopy, left retrograde pyelogram, intraoperative interpretation. 2.  Left stage ureteroscopy with basketing of stones and exchange of left ureteral stent 5 x 24 Polaris proximal end in the renal pelvis, distal end urinary bladder.  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  He is very compliant medically.  He had a stone recurrence in the left side approximately 1.52 cm total volume with intermittent obstruction noted on  imaging and workup of flank pain.  He was counseled towards path of staged ureteroscopy with goal of stone free and he wished to proceed.  He underwent first stage procedure on 01/12/2019 at which point it was estimated approximately 60% to 70% of the  stone volume was able to be addressed  given the inherent very large volume of stone and fragments generated, it was clearly felt that a second-stage procedure would be warranted and he presents for this today.  No interval fevers.  Informed consent was  obtained and placed in medical record.  DESCRIPTION OF PROCEDURE:  The patient was identified as being [name], the procedure being cystoscopy, left second-stage ureteroscopic stimulation was confirmed.  A procedure timeout was performed.  Antibiotics administered.  General anesthesia  induced.  The patient was placed into a low lithotomy position, sterile field was created prepped and draped the patient's penis, perineum and proximal thighs using iodine.  Cystourethroscopy was performed using a 21-French rigid cystoscope with offset  lens.  Inspection of the urinary bladder revealed the distal end of left ureteral stent in  situ.  It was grasped, brought out in its entirety, set aside for discard and the left ureteral orifice was cannulated with a 6-French renal catheter and left  retrograde pyelogram was obtained.  Left retrograde pyelogram demonstrates a single left ureter with single system left kidney.  No filling defects or narrowing noted.  A 0.03 ZIPwire was advanced to lower pole cyst as a safety wire.  An 8-French feeding tube was placed in the urinary  bladder for pressure release, and semirigid ureteroscopy was performed of the distal 4/5 left ureter alongside a separate sensor working wire.  No significant mucosal abnormalities or calcifications were noted.  The semirigid scope was exchanged for a  12/14, 35 cm ureteral access sheath to the level of the proximal ureter using continuous fluoroscopic guidance and flexible digital ureteroscopy performed the proximal left ureter.  Systematic inspection of the left kidney, including all calices x3.   There is multifocal relatively small volume stone.  Total stone volume approximately 8 mm, mostly small fragments from prior lithotripsy, ureteroscopy, and these were amenable to simple basketing and an NCompass basket was used to remove all accessible  stone fragments, which were then set aside for discard.  Following this, there was complete resolution of all accessible stone fragments larger than one-third mm.  Excellent hemostasis.  No evidence of renal perforation.  The access sheath was removed  under continuous vision, no mucosal abnormalities were found.  Given access sheath usage, it was felt that brief interval stenting with tethered stent be warranted.  As such, a new 5 x 24 Polaris-type stent was placed remaining safety wire using  fluoroscopic guidance.  Good proximal were noted.  Tether was left in place and  fashioned to the dorsum of the penis and the procedure was terminated.  The patient tolerated the procedure well.  No immediate apparent complications.   The patient was taken  to postanesthesia care in stable condition.  Discharge home.  PN/NUANCE  D:01/28/2019 T:01/29/2019 JOB:[id number]

## 2019-04-30 ENCOUNTER — Encounter (HOSPITAL_COMMUNITY): Payer: Self-pay

## 2019-04-30 ENCOUNTER — Other Ambulatory Visit: Payer: Self-pay

## 2019-04-30 DIAGNOSIS — R109 Unspecified abdominal pain: Secondary | ICD-10-CM | POA: Diagnosis present

## 2019-04-30 DIAGNOSIS — Z79899 Other long term (current) drug therapy: Secondary | ICD-10-CM | POA: Diagnosis not present

## 2019-04-30 LAB — CBC
HCT: 45.1 % (ref 39.0–52.0)
Hemoglobin: 15.7 g/dL (ref 13.0–17.0)
MCH: 33 pg (ref 26.0–34.0)
MCHC: 34.8 g/dL (ref 30.0–36.0)
MCV: 94.7 fL (ref 80.0–100.0)
Platelets: 288 10*3/uL (ref 150–400)
RBC: 4.76 MIL/uL (ref 4.22–5.81)
RDW: 11.1 % — ABNORMAL LOW (ref 11.5–15.5)
WBC: 5.9 10*3/uL (ref 4.0–10.5)
nRBC: 0 % (ref 0.0–0.2)

## 2019-04-30 NOTE — ED Triage Notes (Signed)
Pt has hx of many stones. Sts he is passing fragments and needs pain control.

## 2019-05-01 ENCOUNTER — Emergency Department (HOSPITAL_COMMUNITY)
Admission: EM | Admit: 2019-05-01 | Discharge: 2019-05-01 | Disposition: A | Discharge status: Home / Self Care | Payer: 59 | Attending: Emergency Medicine | Admitting: Emergency Medicine

## 2019-05-01 ENCOUNTER — Emergency Department (HOSPITAL_COMMUNITY): Payer: 59

## 2019-05-01 DIAGNOSIS — R109 Unspecified abdominal pain: Secondary | ICD-10-CM

## 2019-05-01 LAB — BASIC METABOLIC PANEL WITH GFR
Anion gap: 9 (ref 5–15)
BUN: 19 mg/dL (ref 6–20)
CO2: 26 mmol/L (ref 22–32)
Calcium: 9.2 mg/dL (ref 8.9–10.3)
Chloride: 103 mmol/L (ref 98–111)
Creatinine, Ser: 0.99 mg/dL (ref 0.61–1.24)
GFR calc Af Amer: >60 mL/min (ref >60)
GFR calc non Af Amer: >60 mL/min (ref >60)
Glucose, Bld: 108 mg/dL — ABNORMAL HIGH (ref 70–99)
Potassium: 4.1 mmol/L (ref 3.5–5.1)
Sodium: 138 mmol/L (ref 135–145)

## 2019-05-01 LAB — URINALYSIS, ROUTINE W REFLEX MICROSCOPIC
Bilirubin Urine: NEGATIVE
Glucose, UA: NEGATIVE mg/dL
Hgb urine dipstick: NEGATIVE
Ketones, ur: NEGATIVE mg/dL
Leukocytes,Ua: NEGATIVE
Nitrite: NEGATIVE
Protein, ur: NEGATIVE mg/dL
Specific Gravity, Urine: 1.024 (ref 1.005–1.030)
pH: 6 (ref 5.0–8.0)

## 2019-05-01 MED ORDER — SODIUM CHLORIDE 0.9 % IV BOLUS
1000.0000 mL | Freq: Once | INTRAVENOUS | Status: AC
  Administered 2019-05-01: 1000 mL via INTRAVENOUS

## 2019-05-01 MED ORDER — HYDROMORPHONE HCL 1 MG/ML IJ SOLN
1.0000 mg | Freq: Once | INTRAMUSCULAR | Status: AC
  Administered 2019-05-01: 1 mg via INTRAVENOUS
  Filled 2019-05-01: qty 1

## 2019-05-01 MED ORDER — ONDANSETRON HCL 4 MG/2ML IJ SOLN
4.0000 mg | Freq: Once | INTRAMUSCULAR | Status: AC
  Administered 2019-05-01: 4 mg via INTRAVENOUS
  Filled 2019-05-01: qty 2

## 2019-05-01 MED ORDER — KETOROLAC TROMETHAMINE 15 MG/ML IJ SOLN
15.0000 mg | Freq: Once | INTRAMUSCULAR | Status: AC
  Administered 2019-05-01: 15 mg via INTRAVENOUS
  Filled 2019-05-01: qty 1

## 2019-05-01 MED ORDER — HYDROCODONE-ACETAMINOPHEN 5-325 MG PO TABS: 1.0000 | ORAL_TABLET | Freq: Four times a day (QID) | ORAL | 0 refills | Status: DC | PRN

## 2019-05-01 MED ORDER — DIPHENHYDRAMINE HCL 50 MG/ML IJ SOLN
12.5000 mg | Freq: Once | INTRAMUSCULAR | Status: AC
  Administered 2019-05-01: 12.5 mg via INTRAVENOUS
  Filled 2019-05-01: qty 1

## 2019-05-01 NOTE — ED Provider Notes (Signed)
  Physical Exam  BP (!) 123/97 (BP Location: Left Arm)   Pulse 94   Temp 98.2 F (36.8 C) (Oral)   Resp 18   Ht 5\' 9"  (1.753 m)   Wt 95.3 kg   SpO2 96%   BMI 31.01 kg/m   Physical Exam  ED Course/Procedures     Procedures  MDM  Received patient in signout from Dr. .  Crampy pain like previous stones.  Reviewing records.  Urine and CBC reassuring.  Has debris in bladder but no hydronephrosis.  Still has stones on left.  Feeling somewhat better.  Doubt other pathology such as diverticulitis.  Will discharge home.       Read Drivers, MD 05/01/19 0800

## 2019-05-01 NOTE — ED Provider Notes (Signed)
And mild  [facility] Provider Note: Devin Spurling, MD, FACEP  CSN: [id number] MRN: [id number] ARRIVAL: 04/30/19 at 2258 ROOM: [place]  Flank Pain   HISTORY OF PRESENT ILLNESS  05/01/19 4:13 AM Devin Sanders is a 39 y.o. male with a history of cystinuria for kidney stones.  He states he is passing stone fragments currently and is having bilateral but primarily right flank pain.  The right flank pain radiates to the right lower quadrant.  He rates the pain as an 8 out of 10 and characterizes it as like previous ureterolithiasis.  This episode began yesterday afternoon and has been associated with nausea and vomiting.  He believes he may have gotten dehydrated due to loss of fluid.   Past Medical History:  Diagnosis Date  . 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 GERD (gastroesophageal reflux disease)    pt denied  . History of kidney stones   . Left ureteral calculus   . Renal calculus, left   . Wears glasses     Past Surgical History:  Procedure Laterality Date  . CYSTOSCOPY WITH RETROGRADE PYELOGRAM, URETEROSCOPY AND STENT PLACEMENT Bilateral 06/26/2017   Procedure: CYSTOSCOPY WITH RETROGRADE PYELOGRAM, URETEROSCOPY, STONE BASKETRY  AND STENT PLACEMENT;  Surgeon: Alexis Frock, MD;  Location: [facility];  Service: Urology;  Laterality: Bilateral;  . CYSTOSCOPY WITH RETROGRADE PYELOGRAM, URETEROSCOPY AND STENT PLACEMENT Right 09/18/2017   Procedure: CYSTOSCOPY WITH RETROGRADE PYELOGRAM, URETEROSCOPY AND STENT PLACEMENT;  Surgeon: Ardis Hughs, MD;  Location: [facility];  Service: Urology;  Laterality: Right;  . CYSTOSCOPY WITH RETROGRADE PYELOGRAM, URETEROSCOPY AND STENT PLACEMENT Bilateral 10/13/2017   Procedure: CYSTOSCOPY WITH RETROGRADE PYELOGRAM, BILATERAL URETEROSCOPY AND BILATERAL STENT PLACEMENT, LASER;  Surgeon: Alexis Frock, MD;  Location: [facility];  Service: Urology;  Laterality: Bilateral;  90  MINS  . CYSTOSCOPY WITH RETROGRADE PYELOGRAM, URETEROSCOPY AND STENT PLACEMENT Bilateral 03/24/2018   Procedure: CYSTOSCOPY WITH RETROGRADE PYELOGRAM, URETEROSCOPY AND STENT PLACEMENT;  Surgeon: Alexis Frock, MD;  Location: [facility];  Service: Urology;  Laterality: Bilateral;  1 HR  . CYSTOSCOPY WITH RETROGRADE PYELOGRAM, URETEROSCOPY AND STENT PLACEMENT Bilateral 07/07/2018   Procedure: CYSTOSCOPY WITH RETROGRADE PYELOGRAM, URETEROSCOPY AND STENT PLACEMENT;  Surgeon: Alexis Frock, MD;  Location: [facility];  Service: Urology;  Laterality: Bilateral;  75 MINUTES  . CYSTOSCOPY WITH RETROGRADE PYELOGRAM, URETEROSCOPY AND STENT PLACEMENT Left 01/12/2019   Procedure: CYSTOSCOPY WITH RETROGRADE PYELOGRAM, URETEROSCOPY AND STENT PLACEMENT;  Surgeon: Alexis Frock, MD;  Location: [facility];  Service: Urology;  Laterality: Left;  90 MINS  . CYSTOSCOPY WITH RETROGRADE PYELOGRAM, URETEROSCOPY AND STENT PLACEMENT Left 01/28/2019   Procedure: CYSTOSCOPY WITH RETROGRADE PYELOGRAM, URETEROSCOPY AND STENT EXCHANGE STONE BASKET EXTRACTION ;  Surgeon: Alexis Frock, MD;  Location: [facility];  Service: Urology;  Laterality: Left;  . CYSTOSCOPY/RETROGRADE/URETEROSCOPY/STONE EXTRACTION WITH BASKET Left 10/05/2017   Procedure: CYSTOSCOPY/RETROGRADE/STONE REMOVAL FROM BLADDER ;  Surgeon: Irine Seal, MD;  Location: [facility];  Service: Urology;  Laterality: Left;  . CYSTOSCOPY/URETEROSCOPY/HOLMIUM LASER/STENT PLACEMENT Right 09/28/2017   Procedure: CYSTOSCOPY/RIGHT RETROGRADE URETEROSCOPY/HOLMIUM LASER/ RIGHT STENT PLACEMENT;  Surgeon: Ceasar Mons, MD;  Location: [facility];  Service: Urology;  Laterality: Right;  . CYSTOSCOPY/URETEROSCOPY/HOLMIUM LASER/STENT PLACEMENT Bilateral 01/27/2018   Procedure: CYSTOSCOPY/URETEROSCOPY/HOLMIUM LASER/STENT PLACEMENT;  Surgeon: Alexis Frock, MD;  Location: [facility];  Service: Urology;  Laterality: Bilateral;  . HOLMIUM LASER APPLICATION Bilateral  3/53/6144   Procedure: HOLMIUM LASER APPLICATION;  Surgeon:  [name], MD;  Location: [facility];  Service: Urology;  Laterality: Bilateral;  . HOLMIUM LASER APPLICATION Right 09/18/2017   Procedure: HOLMIUM LASER APPLICATION;  Surgeon: Crist Fat, MD;  Location: [facility];  Service: Urology;  Laterality: Right;  . HOLMIUM LASER APPLICATION Bilateral 10/13/2017   Procedure: HOLMIUM LASER APPLICATION;  Surgeon: [name], MD;  Location: [facility];  Service: Urology;  Laterality: Bilateral;  . HOLMIUM LASER APPLICATION Bilateral 01/27/2018   Procedure: HOLMIUM LASER APPLICATION;  Surgeon: [name], MD;  Location: [facility];  Service: Urology;  Laterality: Bilateral;  . HOLMIUM LASER APPLICATION Bilateral 03/24/2018   Procedure: HOLMIUM LASER APPLICATION;  Surgeon: [name], MD;  Location: [facility];  Service: Urology;  Laterality: Bilateral;  . HOLMIUM LASER APPLICATION Bilateral 07/07/2018   Procedure: HOLMIUM LASER APPLICATION;  Surgeon: [name], MD;  Location: [facility];  Service: Urology;  Laterality: Bilateral;  . HOLMIUM LASER APPLICATION Left 01/12/2019   Procedure: HOLMIUM LASER APPLICATION;  Surgeon: [name], MD;  Location: [facility];  Service: Urology;  Laterality: Left;  . PERCUTANEOUS NEPHROSTOLITHOTOMY  2003;  2009;  06-22-2014; 01-30-2015  @ [facility]  . URETEROSCOPIC STONE MANIPULATION UNILATERAL  multiple since 1997--  last one 02-19-2017  @ [facility]    No family history on file.  Social History   Tobacco Use  . Smoking status: Never Smoker  . Smokeless tobacco: Never Used  Substance Use Topics  . Alcohol use: Never  . Drug use: Never    Prior to Admission medications   Medication Sig Start Date End Date Taking? Authorizing Provider  ibuprofen (ADVIL) 200 MG tablet Take 200-800 mg by mouth daily as needed for fever, moderate pain or cramping.   Yes [provider]  Potassium Citrate 15 MEQ (1620 MG) TBCR Take  2 tablets by mouth 2 (two) times daily. 07/14/18  Yes [provider]  HYDROcodone-acetaminophen (NORCO/VICODIN) 5-325 MG tablet Take 1-2 tablets by mouth every 6 (six) hours as needed. 05/01/19   [name] Core, MD    Allergies Morphine and Other   REVIEW OF SYSTEMS  Negative except as noted here or in the History of Present Illness.   PHYSICAL EXAMINATION  Initial Vital Signs Blood pressure (!) 144/112, pulse (!) 102, temperature 98.4 F (36.9 C), temperature source Oral, resp. rate 16, height 5\' 9"  (1.753 m), weight 95.3 kg, SpO2 95 %.  Examination General: Well-developed, well-nourished male in no acute distress; appearance consistent with age of record HENT: normocephalic; atraumatic Eyes: Normal appearance Neck: supple Heart: regular rate and rhythm Lungs: clear to auscultation bilaterally Abdomen: soft; nondistended; right lower quadrant tenderness; bowel sounds present GU: Mild left CVA tenderness Extremities: No deformity; full range of motion; pulses normal Neurologic: Awake, alert and oriented; motor function intact in all extremities and symmetric; no facial droop Skin: Warm and dry Psychiatric: Normal mood and affect   RESULTS  Summary of this visit's results, reviewed and interpreted by myself:   EKG Interpretation  Date/Time:    Ventricular Rate:    PR Interval:    QRS Duration:   QT Interval:    QTC Calculation:   R Axis:     Text Interpretation:        Laboratory Studies: No results found for this or any previous visit (from the past 24 hour(s)). Imaging Studies: No results found.  ED COURSE and MDM  Nursing notes, initial and subsequent vitals signs, including pulse oximetry, reviewed and interpreted by myself.  Vitals:  04/30/19 2321 05/01/19 0445 05/01/19 0545 05/01/19 0751  BP:  (!) 145/100 (!) 126/91 (!) 123/97  Pulse:  86 89 94  Resp:  16 17 18   Temp: 98.4 F (36.9 C) 98.2 F (36.8 C)    TempSrc: Oral Oral    SpO2:  98%  95% 96%  Weight:      Height:        7:17 AM Renal ultrasound pending. Signed out to Dr. .  PROCEDURES  Procedures   ED DIAGNOSES     ICD-10-CM   1. Flank pain  R10.9        Christabell Loseke, Rubin Payor, MD 05/03/19 2236

## 2019-05-09 ENCOUNTER — Other Ambulatory Visit: Payer: Self-pay | Admitting: Urology

## 2019-05-10 ENCOUNTER — Other Ambulatory Visit: Payer: Self-pay

## 2019-05-10 ENCOUNTER — Other Ambulatory Visit (HOSPITAL_COMMUNITY)
Admission: RE | Admit: 2019-05-10 | Discharge: 2019-05-10 | Disposition: A | Discharge status: Home / Self Care | Payer: 59 | Source: Ambulatory Visit | Attending: Urology | Admitting: Urology

## 2019-05-10 ENCOUNTER — Encounter (HOSPITAL_COMMUNITY): Payer: Self-pay

## 2019-05-10 ENCOUNTER — Inpatient Hospital Stay (HOSPITAL_COMMUNITY)
Admission: RE | Admit: 2019-05-10 | Discharge: 2019-05-10 | Disposition: A | Discharge status: Home / Self Care | Payer: 59 | Source: Ambulatory Visit

## 2019-05-10 DIAGNOSIS — Z20822 Contact with and (suspected) exposure to covid-19: Secondary | ICD-10-CM | POA: Diagnosis not present

## 2019-05-10 DIAGNOSIS — Z01812 Encounter for preprocedural laboratory examination: Secondary | ICD-10-CM | POA: Diagnosis not present

## 2019-05-10 LAB — SARS CORONAVIRUS 2 (TAT 6-24 HRS): SARS Coronavirus 2: NEGATIVE

## 2019-05-10 NOTE — Progress Notes (Signed)
Can you please place orders for the upcomming surgery.Thank you.

## 2019-05-10 NOTE — Patient Instructions (Addendum)
DUE TO COVID-19 ONLY ONE VISITOR IS ALLOWED TO COME WITH YOU AND STAY IN THE WAITING ROOM ONLY DURING PRE OP AND PROCEDURE DAY OF SURGERY. THE 1 VISITOR MAY VISIT WITH YOU AFTER SURGERY IN YOUR PRIVATE ROOM DURING VISITING HOURS ONLY!  YOU NEED TO HAVE A COVID 19 TEST ON: 05/10/19, THIS TEST MUST BE DONE BEFORE SURGERY, COME  [place], [place] , [address].  ([place]) ONCE YOUR COVID TEST IS COMPLETED, PLEASE BEGIN THE QUARANTINE INSTRUCTIONS AS OUTLINED IN YOUR HANDOUT.                Lanette Hampshire     Your procedure is scheduled on: 05/13/19   Report to [facility] Main  Entrance   Report to admitting at: 7:30 am AM     Call this number if you have problems the morning of surgery [phone]    Remember: Do not eat  Solid food :After Midnight. Clear liquid diet from midnight until 6:30 am    CLEAR LIQUID DIET   Foods Allowed                                                                     Foods Excluded  Coffee and tea, regular and decaf                             liquids that you cannot  Plain Jell-O any favor except red or purple                                           see through such as: Fruit ices (not with fruit pulp)                                     milk, soups, orange juice  Iced Popsicles                                    All solid food Carbonated beverages, regular and diet                                    Cranberry, grape and apple juices Sports drinks like Gatorade Lightly seasoned clear broth or consume(fat free) Sugar, honey syrup  Sample Menu Breakfast                                Lunch                                     Supper Cranberry juice                    Beef broth  Chicken broth Jell-O                                     Grape juice                           Apple juice Coffee or tea                        Jell-O                                      Popsicle                                                 Coffee or tea                        Coffee or tea  _____________________________________________________________________  BRUSH YOUR TEETH MORNING OF SURGERY AND RINSE YOUR MOUTH OUT, NO CHEWING GUM CANDY OR MINTS.     Take these medicines the morning of surgery with A SIP OF WATER: N/A                                 You may not have any metal on your body including hair pins and              piercings  Do not wear jewelry, lotions, powders or perfumes, deodorant              Men may shave face and neck.   Do not bring valuables to the hospital. [facility] IS NOT             RESPONSIBLE   FOR VALUABLES.  Contacts, dentures or bridgework may not be worn into surgery.  Leave suitcase in the car. After surgery it may be brought to your room.     Patients discharged the day of surgery will not be allowed to drive home. IF YOU ARE HAVING SURGERY AND GOING HOME THE SAME DAY, YOU MUST HAVE AN ADULT TO DRIVE YOU HOME AND BE WITH YOU FOR 24 HOURS. YOU MAY GO HOME BY TAXI OR UBER OR ORTHERWISE, BUT AN ADULT MUST ACCOMPANY YOU HOME AND STAY WITH YOU FOR 24 HOURS.  Name and phone number of your driver:  Special Instructions: N/A              Please read over the following fact sheets you were given: _____________________________________________________________________             [facility] - Preparing for Surgery Before surgery, you can play an important role.  Because skin is not sterile, your skin needs to be as free of germs as possible.  You can reduce the number of germs on your skin by washing with CHG (chlorahexidine gluconate) soap before surgery.  CHG is an antiseptic cleaner which kills germs and bonds with the skin to continue killing germs even after washing. Please DO NOT use if you have an allergy to CHG or antibacterial soaps.  If your skin becomes reddened/irritated  stop using the CHG and inform your nurse when you arrive at Short Stay. Do  not shave (including legs and underarms) for at least 48 hours prior to the first CHG shower.  You may shave your face/neck. Please follow these instructions carefully:  1.  Shower with CHG Soap the night before surgery and the  morning of Surgery.  2.  If you choose to wash your hair, wash your hair first as usual with your  normal  shampoo.  3.  After you shampoo, rinse your hair and body thoroughly to remove the  shampoo.                           4.  Use CHG as you would any other liquid soap.  You can apply chg directly  to the skin and wash                       Gently with a scrungie or clean washcloth.  5.  Apply the CHG Soap to your body ONLY FROM THE NECK DOWN.   Do not use on face/ open                           Wound or open sores. Avoid contact with eyes, ears mouth and genitals (private parts).                       Wash face,  Genitals (private parts) with your normal soap.             6.  Wash thoroughly, paying special attention to the area where your surgery  will be performed.  7.  Thoroughly rinse your body with warm water from the neck down.  8.  DO NOT shower/wash with your normal soap after using and rinsing off  the CHG Soap.                9.  Pat yourself dry with a clean towel.            10.  Wear clean pajamas.            11.  Place clean sheets on your bed the night of your first shower and do not  sleep with pets. Day of Surgery : Do not apply any lotions/deodorants the morning of surgery.  Please wear clean clothes to the hospital/surgery center.  FAILURE TO FOLLOW THESE INSTRUCTIONS MAY RESULT IN THE CANCELLATION OF YOUR SURGERY PATIENT SIGNATURE_________________________________  NURSE SIGNATURE__________________________________  ________________________________________________________________________

## 2019-05-11 ENCOUNTER — Other Ambulatory Visit: Payer: Self-pay

## 2019-05-11 ENCOUNTER — Encounter (HOSPITAL_COMMUNITY)
Admission: RE | Admit: 2019-05-11 | Discharge: 2019-05-11 | Disposition: A | Discharge status: Home / Self Care | Payer: 59 | Source: Ambulatory Visit | Attending: Urology | Admitting: Urology

## 2019-05-11 DIAGNOSIS — Z01812 Encounter for preprocedural laboratory examination: Secondary | ICD-10-CM | POA: Insufficient documentation

## 2019-05-11 LAB — CBC
HCT: 45.9 % (ref 39.0–52.0)
Hemoglobin: 15.3 g/dL (ref 13.0–17.0)
MCH: 33.1 pg (ref 26.0–34.0)
MCHC: 33.3 g/dL (ref 30.0–36.0)
MCV: 99.4 fL (ref 80.0–100.0)
Platelets: 246 10*3/uL (ref 150–400)
RBC: 4.62 MIL/uL (ref 4.22–5.81)
RDW: 11.1 % — ABNORMAL LOW (ref 11.5–15.5)
WBC: 5.2 10*3/uL (ref 4.0–10.5)
nRBC: 0 % (ref 0.0–0.2)

## 2019-05-11 NOTE — Progress Notes (Signed)
Need orders in epic.  Surgery on 05/13/19.  Thank You.

## 2019-05-12 MED ORDER — GENTAMICIN SULFATE 40 MG/ML IJ SOLN
5.0000 mg/kg | INTRAVENOUS | Status: AC
  Administered 2019-05-13: 430 mg via INTRAVENOUS
  Filled 2019-05-12: qty 10.75

## 2019-05-13 ENCOUNTER — Ambulatory Visit (HOSPITAL_COMMUNITY): Payer: 59

## 2019-05-13 ENCOUNTER — Encounter (HOSPITAL_COMMUNITY)
Admission: RE | Disposition: A | Discharge status: Home / Self Care | Payer: Self-pay | Source: Home / Self Care | Attending: Urology

## 2019-05-13 ENCOUNTER — Encounter (HOSPITAL_COMMUNITY): Payer: Self-pay | Admitting: Urology

## 2019-05-13 ENCOUNTER — Ambulatory Visit (HOSPITAL_COMMUNITY): Payer: 59 | Admitting: Physician Assistant

## 2019-05-13 ENCOUNTER — Ambulatory Visit (HOSPITAL_COMMUNITY)
Admission: RE | Admit: 2019-05-13 | Discharge: 2019-05-13 | Disposition: A | Discharge status: Home / Self Care | Payer: 59 | Attending: Urology | Admitting: Urology

## 2019-05-13 DIAGNOSIS — N132 Hydronephrosis with renal and ureteral calculous obstruction: Secondary | ICD-10-CM | POA: Insufficient documentation

## 2019-05-13 DIAGNOSIS — E669 Obesity, unspecified: Secondary | ICD-10-CM | POA: Insufficient documentation

## 2019-05-13 DIAGNOSIS — Z87442 Personal history of urinary calculi: Secondary | ICD-10-CM | POA: Diagnosis not present

## 2019-05-13 DIAGNOSIS — Z6833 Body mass index (BMI) 33.0-33.9, adult: Secondary | ICD-10-CM | POA: Diagnosis not present

## 2019-05-13 DIAGNOSIS — E7201 Cystinuria: Secondary | ICD-10-CM | POA: Diagnosis not present

## 2019-05-13 HISTORY — PX: CYSTOSCOPY WITH RETROGRADE PYELOGRAM, URETEROSCOPY AND STENT PLACEMENT: SHX5789

## 2019-05-13 SURGERY — CYSTOURETEROSCOPY, WITH RETROGRADE PYELOGRAM AND STENT INSERTION
Anesthesia: General | Site: Ureter | Laterality: Bilateral

## 2019-05-13 MED ORDER — ONDANSETRON HCL 4 MG/2ML IJ SOLN
INTRAMUSCULAR | Status: AC
  Filled 2019-05-13: qty 2

## 2019-05-13 MED ORDER — HYDROMORPHONE HCL 1 MG/ML IJ SOLN
INTRAMUSCULAR | Status: AC
  Filled 2019-05-13: qty 1

## 2019-05-13 MED ORDER — CEPHALEXIN 500 MG PO CAPS: 500.0000 mg | ORAL_CAPSULE | Freq: Two times a day (BID) | ORAL | 0 refills | Status: DC

## 2019-05-13 MED ORDER — HYDROMORPHONE HCL 1 MG/ML IJ SOLN
0.2500 mg | INTRAMUSCULAR | Status: DC | PRN
  Administered 2019-05-13 (×3): 0.5 mg via INTRAVENOUS

## 2019-05-13 MED ORDER — MIDAZOLAM HCL 5 MG/5ML IJ SOLN
INTRAMUSCULAR | Status: DC | PRN
  Administered 2019-05-13: 2 mg via INTRAVENOUS

## 2019-05-13 MED ORDER — DIPHENHYDRAMINE HCL 50 MG/ML IJ SOLN
12.5000 mg | Freq: Once | INTRAMUSCULAR | Status: AC
  Administered 2019-05-13: 12.5 mg via INTRAVENOUS

## 2019-05-13 MED ORDER — LACTATED RINGERS IV SOLN: INTRAVENOUS | Status: DC

## 2019-05-13 MED ORDER — MEPERIDINE HCL 50 MG/ML IJ SOLN: 6.2500 mg | INTRAMUSCULAR | Status: DC | PRN

## 2019-05-13 MED ORDER — DEXAMETHASONE SODIUM PHOSPHATE 10 MG/ML IJ SOLN
INTRAMUSCULAR | Status: AC
  Filled 2019-05-13: qty 1

## 2019-05-13 MED ORDER — IOHEXOL 300 MG/ML  SOLN
INTRAMUSCULAR | Status: DC | PRN
  Administered 2019-05-13: 25 mL via URETHRAL

## 2019-05-13 MED ORDER — DEXAMETHASONE SODIUM PHOSPHATE 4 MG/ML IJ SOLN
INTRAMUSCULAR | Status: DC | PRN
  Administered 2019-05-13: 4 mg via INTRAVENOUS

## 2019-05-13 MED ORDER — KETOROLAC TROMETHAMINE 10 MG PO TABS: 10.0000 mg | ORAL_TABLET | Freq: Three times a day (TID) | ORAL | 0 refills | Status: DC | PRN

## 2019-05-13 MED ORDER — LIDOCAINE 2% (20 MG/ML) 5 ML SYRINGE
INTRAMUSCULAR | Status: AC
  Filled 2019-05-13: qty 5

## 2019-05-13 MED ORDER — PROPOFOL 10 MG/ML IV BOLUS
INTRAVENOUS | Status: DC | PRN
  Administered 2019-05-13: 160 mg via INTRAVENOUS

## 2019-05-13 MED ORDER — PROPOFOL 10 MG/ML IV BOLUS
INTRAVENOUS | Status: AC
  Filled 2019-05-13: qty 20

## 2019-05-13 MED ORDER — DIPHENHYDRAMINE HCL 50 MG/ML IJ SOLN
INTRAMUSCULAR | Status: AC
  Filled 2019-05-13: qty 1

## 2019-05-13 MED ORDER — LIDOCAINE 2% (20 MG/ML) 5 ML SYRINGE
INTRAMUSCULAR | Status: DC | PRN
  Administered 2019-05-13: 100 mg via INTRAVENOUS

## 2019-05-13 MED ORDER — FENTANYL CITRATE (PF) 100 MCG/2ML IJ SOLN
INTRAMUSCULAR | Status: AC
  Filled 2019-05-13: qty 2

## 2019-05-13 MED ORDER — ONDANSETRON HCL 4 MG/2ML IJ SOLN
INTRAMUSCULAR | Status: DC | PRN
  Administered 2019-05-13: 4 mg via INTRAVENOUS

## 2019-05-13 MED ORDER — SODIUM CHLORIDE 0.9 % IR SOLN
Status: DC | PRN
  Administered 2019-05-13: 3000 mL

## 2019-05-13 MED ORDER — PHENYLEPHRINE HCL (PRESSORS) 10 MG/ML IV SOLN
INTRAVENOUS | Status: DC | PRN
Start: 2019-05-13 — End: 2019-05-13
  Administered 2019-05-13: 120 ug via INTRAVENOUS

## 2019-05-13 MED ORDER — FENTANYL CITRATE (PF) 100 MCG/2ML IJ SOLN
INTRAMUSCULAR | Status: DC | PRN
  Administered 2019-05-13 (×2): 50 ug via INTRAVENOUS

## 2019-05-13 MED ORDER — ONDANSETRON HCL 4 MG/2ML IJ SOLN: 4.0000 mg | Freq: Once | INTRAMUSCULAR | Status: DC | PRN

## 2019-05-13 MED ORDER — MIDAZOLAM HCL 2 MG/2ML IJ SOLN
INTRAMUSCULAR | Status: AC
  Filled 2019-05-13: qty 2

## 2019-05-13 SURGICAL SUPPLY — 22 items
BAG URO CATCHER STRL LF (MISCELLANEOUS) ×2 IMPLANT
BASKET LASER NITINOL 1.9FR (BASKET) ×1 IMPLANT
BSKT STON RTRVL 120 1.9FR (BASKET) ×1
CATH INTERMIT  6FR 70CM (CATHETERS) ×2 IMPLANT
CLOTH BEACON ORANGE TIMEOUT ST (SAFETY) ×2 IMPLANT
EXTRACTOR STONE 1.7FRX115CM (UROLOGICAL SUPPLIES) IMPLANT
FIBER LASER FLEXIVA 365 (UROLOGICAL SUPPLIES) IMPLANT
FIBER LASER TRAC TIP (UROLOGICAL SUPPLIES) ×1 IMPLANT
GLOVE BIOGEL M STRL SZ7.5 (GLOVE) ×2 IMPLANT
GOWN STRL REUS W/TWL LRG LVL3 (GOWN DISPOSABLE) ×2 IMPLANT
GUIDEWIRE ANG ZIPWIRE 038X150 (WIRE) ×3 IMPLANT
GUIDEWIRE STR DUAL SENSOR (WIRE) ×3 IMPLANT
KIT TURNOVER KIT A (KITS) ×1 IMPLANT
MANIFOLD NEPTUNE II (INSTRUMENTS) ×2 IMPLANT
SHEATH URETERAL 12FRX28CM (UROLOGICAL SUPPLIES) IMPLANT
SHEATH URETERAL 12FRX35CM (MISCELLANEOUS) ×1 IMPLANT
STENT POLARIS 5FRX24 (STENTS) ×2 IMPLANT
STRIP CLOSURE SKIN 1/2X4 (GAUZE/BANDAGES/DRESSINGS) ×1 IMPLANT
TRAY CYSTO PACK (CUSTOM PROCEDURE TRAY) ×2 IMPLANT
TUBE FEEDING 8FR 16IN STR KANG (MISCELLANEOUS) ×2 IMPLANT
TUBING CONNECTING 10 (TUBING) ×2 IMPLANT
TUBING UROLOGY SET (TUBING) ×2 IMPLANT

## 2019-05-13 NOTE — Anesthesia Preprocedure Evaluation (Signed)
Anesthesia Evaluation  Patient identified by MRN, date of birth, ID band Patient awake    Reviewed: Allergy & Precautions, NPO status , Patient's Chart, lab work & pertinent test results  Airway Mallampati: I  TM Distance: >3 FB Neck ROM: Full    Dental   Pulmonary    Pulmonary exam normal        Cardiovascular Normal cardiovascular exam     Neuro/Psych    GI/Hepatic GERD  Medicated and Controlled,  Endo/Other    Renal/GU      Musculoskeletal   Abdominal   Peds  Hematology   Anesthesia Other Findings   Reproductive/Obstetrics                             Anesthesia Physical Anesthesia Plan  ASA: II  Anesthesia Plan: General   Post-op Pain Management:    Induction: Intravenous  PONV Risk Score and Plan: 2 and Ondansetron and Midazolam  Airway Management Planned: LMA  Additional Equipment:   Intra-op Plan:   Post-operative Plan: Extubation in OR  Informed Consent: I have reviewed the patients History and Physical, chart, labs and discussed the procedure including the risks, benefits and alternatives for the proposed anesthesia with the patient or authorized representative who has indicated his/her understanding and acceptance.       Plan Discussed with: CRNA and Surgeon  Anesthesia Plan Comments:         Anesthesia Quick Evaluation

## 2019-05-13 NOTE — Discharge Instructions (Signed)
1 - You may have urinary urgency (bladder spasms) and bloody urine on / off with stent in place. This is normal.  2 - Remove tethered stents on Monday morning at home. Office is open Monday if problems arise.   3 - Call MD or go to ER for fever >102, severe pain / nausea / vomiting not relieved by medications, or acute change in medical status

## 2019-05-13 NOTE — H&P (Signed)
Devin Sanders is an 39 y.o. male.    Chief Complaint: Pre-Op Bilateral Ureteroscopic Stone Manipulation  HPI:   1 - Recurrent Nephrolithiasis -  Pre 2019 - PCNL several each side, URS x innumerable each side at [facility].  02/2017 - left URS for 1.5cm stone to stone reduction (not stone free) at [facility] per OR notes  06/2017 - bilateral URS to stone free (Devin Sanders)  09/2017 - Rt ureteroscopy / Lt medical passage Devin Sanders / Devin Sanders); 10/2017 - bilateral URS to stone free  01/2018 - bilateral URS to stone free  01/2019 - Left 3cm scattered renal stones, Rt stone free by ER CT. Underwent 1st stage ureteroscopy 01/12/19, second stage 1/29, stented in interval.   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 CMP, CBC 02/2017 normal, Cr 0.9.   PMH sig for mild obesity. He is 03/2017 for all 14 sites of Estate [employer]/land [employer]. His PCP is Liz Claiborne MD.   Today "Devin Sanders" is seen to proceed with BILATERAL ureteroscopic stone manipulation for L>R renal stones and flank pain. Most recent UA without infectious parameters. C19 screen negative.   Past Medical History:  Diagnosis Date  . 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 GERD (gastroesophageal reflux disease)    pt denied  . History of kidney stones   . Left ureteral calculus   . Renal calculus, left   . Wears glasses     Past Surgical History:  Procedure Laterality Date  . CYSTOSCOPY WITH RETROGRADE PYELOGRAM, URETEROSCOPY AND STENT PLACEMENT Bilateral 06/26/2017   Procedure: CYSTOSCOPY WITH RETROGRADE PYELOGRAM, URETEROSCOPY, STONE BASKETRY  AND STENT PLACEMENT;  Surgeon: 06/28/2017, MD;  Location[facility];  Service: Urology;  Laterality: Bilateral;  . CYSTOSCOPY WITH RETROGRADE PYELOGRAM, URETEROSCOPY AND STENT PLACEMENT Right 09/18/2017   Procedure: CYSTOSCOPY WITH RETROGRADE PYELOGRAM, URETEROSCOPY AND STENT PLACEMENT;  Surgeon: 09/20/2017, MD;   Location: [facility];  Service: Urology;  Laterality: Right;  . CYSTOSCOPY WITH RETROGRADE PYELOGRAM, URETEROSCOPY AND STENT PLACEMENT Bilateral 10/13/2017   Procedure: CYSTOSCOPY WITH RETROGRADE PYELOGRAM, BILATERAL URETEROSCOPY AND BILATERAL STENT PLACEMENT, LASER;  Surgeon: 12/13/2017, MD;  Location: [facility];  Service: Urology;  Laterality: Bilateral;  90 MINS  . CYSTOSCOPY WITH RETROGRADE PYELOGRAM, URETEROSCOPY AND STENT PLACEMENT Bilateral 03/24/2018   Procedure: CYSTOSCOPY WITH RETROGRADE PYELOGRAM, URETEROSCOPY AND STENT PLACEMENT;  Surgeon: 03/26/2018, MD;  Location: [facility];  Service: Urology;  Laterality: Bilateral;  1 HR  . CYSTOSCOPY WITH RETROGRADE PYELOGRAM, URETEROSCOPY AND STENT PLACEMENT Bilateral 07/07/2018   Procedure: CYSTOSCOPY WITH RETROGRADE PYELOGRAM, URETEROSCOPY AND STENT PLACEMENT;  Surgeon: 09/07/2018, MD;  Location: [facility];  Service: Urology;  Laterality: Bilateral;  75 MINUTES  . CYSTOSCOPY WITH RETROGRADE PYELOGRAM, URETEROSCOPY AND STENT PLACEMENT Left 01/12/2019   Procedure: CYSTOSCOPY WITH RETROGRADE PYELOGRAM, URETEROSCOPY AND STENT PLACEMENT;  Surgeon: 03/12/2019, MD;  Location: [facility];  Service: Urology;  Laterality: Left;  90 MINS  . CYSTOSCOPY WITH RETROGRADE PYELOGRAM, URETEROSCOPY AND STENT PLACEMENT Left 01/28/2019   Procedure: CYSTOSCOPY WITH RETROGRADE PYELOGRAM, URETEROSCOPY AND STENT EXCHANGE STONE BASKET EXTRACTION ;  Surgeon: 01/30/2019, MD;  Location: [facility];  Service: Urology;  Laterality: Left;  . CYSTOSCOPY/RETROGRADE/URETEROSCOPY/STONE EXTRACTION WITH BASKET Left 10/05/2017   Procedure: CYSTOSCOPY/RETROGRADE/STONE REMOVAL FROM BLADDER ;  Surgeon: 10/07/2017, MD;  Location: [facility];  Service: Urology;  Laterality: Left;  . CYSTOSCOPY/URETEROSCOPY/HOLMIUM LASER/STENT PLACEMENT Right 09/28/2017  Procedure: CYSTOSCOPY/RIGHT RETROGRADE URETEROSCOPY/HOLMIUM LASER/ RIGHT STENT PLACEMENT;  Surgeon: Ceasar Mons,  MD;  Location: [facility];  Service: Urology;  Laterality: Right;  . CYSTOSCOPY/URETEROSCOPY/HOLMIUM LASER/STENT PLACEMENT Bilateral 01/27/2018   Procedure: CYSTOSCOPY/URETEROSCOPY/HOLMIUM LASER/STENT PLACEMENT;  Surgeon: Alexis Frock, MD;  Location: [facility];  Service: Urology;  Laterality: Bilateral;  . HOLMIUM LASER APPLICATION Bilateral 8/46/9629   Procedure: HOLMIUM LASER APPLICATION;  Surgeon: Alexis Frock, MD;  Location: [facility];  Service: Urology;  Laterality: Bilateral;  . HOLMIUM LASER APPLICATION Right 06/03/4130   Procedure: HOLMIUM LASER APPLICATION;  Surgeon: Ardis Hughs, MD;  Location: [facility];  Service: Urology;  Laterality: Right;  . HOLMIUM LASER APPLICATION Bilateral 44/0/1027   Procedure: HOLMIUM LASER APPLICATION;  Surgeon: Alexis Frock, MD;  Location: [facility];  Service: Urology;  Laterality: Bilateral;  . HOLMIUM LASER APPLICATION Bilateral 2/53/6644   Procedure: HOLMIUM LASER APPLICATION;  Surgeon: Alexis Frock, MD;  Location: [facility];  Service: Urology;  Laterality: Bilateral;  . HOLMIUM LASER APPLICATION Bilateral 0/34/7425   Procedure: HOLMIUM LASER APPLICATION;  Surgeon: Alexis Frock, MD;  Location: [facility];  Service: Urology;  Laterality: Bilateral;  . HOLMIUM LASER APPLICATION Bilateral 09/10/6385   Procedure: HOLMIUM LASER APPLICATION;  Surgeon: Alexis Frock, MD;  Location: [facility];  Service: Urology;  Laterality: Bilateral;  . HOLMIUM LASER APPLICATION Left 05/12/4330   Procedure: HOLMIUM LASER APPLICATION;  Surgeon: Alexis Frock, MD;  Location: [facility];  Service: Urology;  Laterality: Left;  . PERCUTANEOUS NEPHROSTOLITHOTOMY  2003;  2009;  06-22-2014; 01-30-2015  @ [facility]  . URETEROSCOPIC STONE MANIPULATION UNILATERAL  multiple since 1997--  last one 02-19-2017  @ [facility]    No family history on file. Social History:  reports that he has never smoked. He has never used smokeless tobacco. He reports  that he does not drink alcohol or use drugs.  Allergies:  Allergies  Allergen Reactions  . Morphine Nausea And Vomiting  . Other Hives and Swelling    [place]    No medications prior to admission.    Results for orders placed or performed during the hospital encounter of 05/11/19 (from the past 48 hour(s))  CBC     Status: Abnormal   Collection Time: 05/11/19  8:41 AM  Result Value Ref Range   WBC 5.2 4.0 - 10.5 K/uL   RBC 4.62 4.22 - 5.81 MIL/uL   Hemoglobin 15.3 13.0 - 17.0 g/dL   HCT 45.9 39.0 - 52.0 %   MCV 99.4 80.0 - 100.0 fL   MCH 33.1 26.0 - 34.0 pg   MCHC 33.3 30.0 - 36.0 g/dL   RDW 11.1 (L) 11.5 - 15.5 %   Platelets 246 150 - 400 K/uL   nRBC 0.0 0.0 - 0.2 %    Comment: Performed at [facility], [place] [address], [place], [place] [address]   No results found.  Review of Systems  Constitutional: Negative for chills and fever.  Genitourinary: Positive for flank pain.  All other systems reviewed and are negative.   There were no vitals taken for this visit. Physical Exam  Constitutional: He appears well-developed.  HENT:  Head: Normocephalic.  Eyes: Pupils are equal, round, and reactive to light.  Cardiovascular: Normal rate.  Respiratory: Effort normal.  GI: Soft.  Genitourinary:    Genitourinary Comments: Midl left CVAT at present.    Musculoskeletal:        General: Normal range of motion.     Cervical back: Normal  range of motion.  Neurological: He is alert.  Skin: Skin is warm.  Psychiatric: He has a normal mood and affect.     Assessment/Plan  Proceed as planned with BILATERAL ureteroscopy with goal of stone free. Risks, benefits, expected peri-op course discussed previously and reiterated today.   [name], MD 05/13/2019, 7:11 AM

## 2019-05-13 NOTE — Anesthesia Postprocedure Evaluation (Signed)
Anesthesia Post Note  Patient: Devin Sanders  Procedure(s) Performed: CYSTOSCOPY WITH BILATERAL  RETROGRADE PYELOGRAM, LEFT URETEROSCOPY WITH HOLMIUM LASER AND STENT PLACEMENT (Bilateral Ureter)     Patient location during evaluation: PACU Anesthesia Type: General Level of consciousness: awake and alert Pain management: pain level controlled Vital Signs Assessment: post-procedure vital signs reviewed and stable Respiratory status: spontaneous breathing, nonlabored ventilation, respiratory function stable and patient connected to nasal cannula oxygen Cardiovascular status: blood pressure returned to baseline and stable Postop Assessment: no apparent nausea or vomiting Anesthetic complications: no    Last Vitals:  Vitals:   05/13/19 1215 05/13/19 1230  BP: 115/67 128/84  Pulse: 82 81  Resp: (!) 9 16  Temp: 36.7 C 36.6 C  SpO2: 91% 95%    Last Pain:  Vitals:   05/13/19 1230  TempSrc:   PainSc: 7                  Jason Frisbee DAVID

## 2019-05-13 NOTE — Brief Op Note (Signed)
05/13/2019  10:52 AM  PATIENT:  Devin Sanders  39 y.o. male  PRE-OPERATIVE DIAGNOSIS:  LEFT RENAL STONE CYSTINURIA  POST-OPERATIVE DIAGNOSIS:  BILATERAL RENAL STONE CYSTINURIA  PROCEDURE:  Procedure(s): CYSTOSCOPY WITH BILATERAL  RETROGRADE PYELOGRAM, LEFT URETEROSCOPY WITH HOLMIUM LASER AND STENT PLACEMENT (Bilateral)  SURGEON:  Surgeon(s) and Role:    * [name], MD - Primary  PHYSICIAN ASSISTANT:   ASSISTANTS: none   ANESTHESIA:   general  EBL:  minimal   BLOOD ADMINISTERED:none  DRAINS: none   LOCAL MEDICATIONS USED:  NONE  SPECIMEN:  Source of Specimen:  bilateral renal / ureteral stone fragments  DISPOSITION OF SPECIMEN:  given to pt  COUNTS:  YES  TOURNIQUET:  * No tourniquets in log *  DICTATION: .Other Dictation: Dictation Number [id number]  PLAN OF CARE: Discharge to home after PACU  PATIENT DISPOSITION:  PACU - hemodynamically stable.   Delay start of Pharmacological VTE agent (>24hrs) due to surgical blood loss or risk of bleeding: yes

## 2019-05-13 NOTE — Anesthesia Procedure Notes (Signed)
Procedure Name: LMA Insertion Date/Time: 05/13/2019 9:49 AM Performed by: Orest Dikes, CRNA Pre-anesthesia Checklist: Patient identified, Emergency Drugs available, Suction available and Patient being monitored Patient Re-evaluated:Patient Re-evaluated prior to induction Oxygen Delivery Method: Circle system utilized and Simple face mask Preoxygenation: Pre-oxygenation with 100% oxygen Induction Type: IV induction LMA: LMA inserted LMA Size: 4.0 Tube secured with: Tape

## 2019-05-13 NOTE — Transfer of Care (Signed)
Immediate Anesthesia Transfer of Care Note  Patient: KALAB CAMPS  Procedure(s) Performed: CYSTOSCOPY WITH BILATERAL  RETROGRADE PYELOGRAM, LEFT URETEROSCOPY WITH HOLMIUM LASER AND STENT PLACEMENT (Bilateral Ureter)  Patient Location: PACU  Anesthesia Type:General  Level of Consciousness: awake, alert  and oriented  Airway & Oxygen Therapy: Patient Spontanous Breathing and Patient connected to face mask oxygen  Post-op Assessment: Report given to RN and Post -op Vital signs reviewed and stable  Post vital signs: Reviewed and stable  Last Vitals:  Vitals Value Taken Time  BP    Temp    Pulse 85 05/13/19 1102  Resp 13 05/13/19 1102  SpO2 96 % 05/13/19 1102  Vitals shown include unvalidated device data.  Last Pain:  Vitals:   05/13/19 0814  TempSrc:   PainSc: 9          Complications: No apparent anesthesia complications

## 2019-05-14 NOTE — Op Note (Signed)
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  PROCEDURE: 1.  Cystoscopy, bilateral pyelograms interpretation. 2.  Bilateral ureteroscopy with laser lithotripsy and insertion of bilateral ureteral stents 5 x 24 Polaris with tether.  ESTIMATED BLOOD LOSS:  Nil.  COMPLICATIONS:  None.  SPECIMENS:  Bilateral renal and ureteral stones given to the patient.   FINDINGS: 1.  Left greater than right renal and ureteral stone. 2.  Complete resolution of all accessible stone fragments larger than one-third mm following laser loosening dissection bilaterally. 3.  Successful placement of bilateral ureteral stents proximal in the renal pelvis, distal end urinary bladder.  INDICATIONS:  The patient is a very pleasant but unfortunate 39 year old man with history of high risk cystinuria.  He is status post innumerable surgeries for recurrent stones.  He was found on workup of left colicky flank pain to have a recurrent left  renal stone.  Given his very reliable symptoms, the decision was made not to proceed with axial imaging, but instead proceed directly with bilateral ureteroscopy, his renal function is normal.  Informed consent was obtained and placed in medical record.  DESCRIPTION OF PROCEDURE:  The patient was identified as being him and procedure using bilateral ureteroscopic stimulation was confirmed. A timeout was performed.  Intravenous antibiotics administered.  General LMA anesthesia induced.  The patient was placed into a low lithotomy position, sterile field was created prepped and draped his penis, perineum and proximal thighs using iodine.   Cystourethroscopy was performed using a 21-French rigid cystoscope vessel lens  placed anterior urethra.  Anterior and posterior urethra was unremarkable.  The left ureteral orifice was cannulated with a [place] renal catheter and left retrograde pyelogram  was obtained.  Left retrograde pyelogram demonstrated a single left ureter single system left kidney.  There was significant hydronephrosis with filling defect in the proximal ureter consistent with likely stone.  A 0.03 ZIPwire into the upper pole and set aside as a  safety wire.  Next, right pyelogram was obtained.  Right pyelogram demonstrated a single right ureter single system right kidney.  No filling defects or narrowing noted in a separate 0.03 ZIPwire was then lower pole as a safety wire.  An 8-French feeding tube was placed in the urinary bladder for  pressure release, and semirigid ureteroscopy of the distal 4 fistula right ureter alongside a separate sensor working wire.  No mucosal masses were found.  Similarly, semirigid ureteroscopy was performed distal orifice left ureter alongside a separate  sensor working wire.  No mucosal abnormalities were found.  There was a fairly large estimated 8 mm ureteral stone in the most proximal aspect of the ureter that was visualized and not amenable to lithotripsy with the semirigid scope.  As such, the  semirigid scope was exchanged for a 12/14 medium length ureteral access sheath to the level of the proximal ureter using continuous fluoroscopic guidance and flexible digital ureteroscopy was then performed, which did allow better visualization and  access of the ureteral stone.  Holmium laser energy applied 70 setting of 0.2 joules and 20 Hz, and approximately 50% of stone volume was dusted 80% of stone volume was fragmented and the fragments were retrograde positioned into the lower kidney.   Inspection of the kidney revealed some upper pole stone as well as significant lower pole  stone.  Total volume approximately 1 cm.  Additional holmium laser energy applied to stones,  fragmented into pieces approximately 1-2 mm and the escape basket was  used to remove numerous fragments estimated at least 50 or 60 of them, in the 1-2 mm size range.  They were set aside to be given to the patient.  There were small volume residual stone fragments less than one-third mm.  These were controlled using a  popcorning technique with laser lithotripsy settings of 1 joule and 10 Hz.  Essentially just stone dust remained.  The access sheath to the left side.  No mucosal abnormalities were found.    Next, a sheath was placed over the right sensor working wire to the level of the proximal right ureter and flexible digital ureteroscopy performed the proximal ureter.  Systematic inspection of the right kidney, right ureter was unremarkable.  Inspection  of the right kidney revealed small volume, mostly papillary tip calcifications that were amenable to laser lithotripsy.  They were ablated using settings of 0.2 joules and 20 Hz.  The access sheath was removed under continuous vision, no mucosal  abnormalities were found.  Given bilateral access sheath usage and large volume stone in the left, the decision was made to proceed with bilateral ureteral stenting with tethers, as such 5 x 24 Polaris-type stents were placed over the bilateral remaining  safety wire using fluoroscopic guidance.  Good pulses plane were noted.  Tethers were left in place fashioned to the dorsum of the penis.  The procedure was terminated.  The patient tolerated the procedure well.  No immediate complications.  The patient  taken to the  postanesthesia care in stable condition.  Plan for discharge home.  PN/NUANCE  D:05/13/2019 T:05/14/2019 JOB:[id number]

## 2019-05-15 ENCOUNTER — Encounter (HOSPITAL_COMMUNITY): Payer: Self-pay

## 2019-05-15 ENCOUNTER — Emergency Department (HOSPITAL_COMMUNITY)
Admission: EM | Admit: 2019-05-15 | Discharge: 2019-05-15 | Disposition: A | Discharge status: Home / Self Care | Payer: 59 | Attending: Emergency Medicine | Admitting: Emergency Medicine

## 2019-05-15 ENCOUNTER — Other Ambulatory Visit: Payer: Self-pay

## 2019-05-15 DIAGNOSIS — R102 Pelvic and perineal pain: Secondary | ICD-10-CM

## 2019-05-15 DIAGNOSIS — R310 Gross hematuria: Secondary | ICD-10-CM | POA: Diagnosis not present

## 2019-05-15 DIAGNOSIS — N200 Calculus of kidney: Secondary | ICD-10-CM | POA: Diagnosis not present

## 2019-05-15 DIAGNOSIS — R1030 Lower abdominal pain, unspecified: Secondary | ICD-10-CM | POA: Diagnosis not present

## 2019-05-15 DIAGNOSIS — R300 Dysuria: Secondary | ICD-10-CM | POA: Diagnosis not present

## 2019-05-15 DIAGNOSIS — Z9889 Other specified postprocedural states: Secondary | ICD-10-CM | POA: Insufficient documentation

## 2019-05-15 DIAGNOSIS — R110 Nausea: Secondary | ICD-10-CM | POA: Insufficient documentation

## 2019-05-15 LAB — URINALYSIS, ROUTINE W REFLEX MICROSCOPIC
Bilirubin Urine: NEGATIVE
Glucose, UA: NEGATIVE mg/dL
Ketones, ur: 5 mg/dL — AB
Nitrite: NEGATIVE
Protein, ur: >300 mg/dL — AB
Specific Gravity, Urine: 1.017 (ref 1.005–1.030)
pH: 6 (ref 5.0–8.0)

## 2019-05-15 LAB — URINALYSIS, MICROSCOPIC (REFLEX)
Bacteria, UA: NONE SEEN
RBC / HPF: >50 RBC/hpf (ref 0–5)
Squamous Epithelial / HPF: NONE SEEN (ref 0–5)

## 2019-05-15 MED ORDER — HYDROMORPHONE HCL 1 MG/ML IJ SOLN
1.0000 mg | Freq: Once | INTRAMUSCULAR | Status: AC
  Administered 2019-05-15: 1 mg via INTRAMUSCULAR
  Filled 2019-05-15: qty 1

## 2019-05-15 MED ORDER — KETOROLAC TROMETHAMINE 15 MG/ML IJ SOLN
15.0000 mg | Freq: Once | INTRAMUSCULAR | Status: AC
  Administered 2019-05-15: 15 mg via INTRAMUSCULAR
  Filled 2019-05-15: qty 1

## 2019-05-15 NOTE — ED Triage Notes (Addendum)
Stents were placed for multiple kidney stones. Bilateral flank pain. Here for pain control.

## 2019-05-15 NOTE — ED Provider Notes (Signed)
[facility]-EMERGENCY DEPT Provider Note   CSN: [id number] Arrival date & time: 05/15/19  1952     History Chief Complaint  Patient presents with  . Flank Pain    Devin Sanders is a 39 y.o. male.  HPI    Patient with a history of recurrent kidney stones, stenting now presents with concern for nausea, dysuria, lower abdominal pain. Notably, the patient had lithotripsy, with placement of stents last week, and is scheduled to remove these tomorrow. He had been doing generally well, using oral narcotics for pain control, until the past day when he has had increasing pain, nausea, making him intolerant of these medications. He denies other interval changes including fever states that he is otherwise well. Pain is focally around the suprapubic area, penis, severe. Past Medical History:  Diagnosis Date  . 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 GERD (gastroesophageal reflux disease)    pt denied  . History of kidney stones   . Left ureteral calculus   . Renal calculus, left   . Wears glasses     Patient Active Problem List   Diagnosis Date Noted  . Hydronephrosis of right kidney 09/28/2017    Past Surgical History:  Procedure Laterality Date  . CYSTOSCOPY WITH RETROGRADE PYELOGRAM, URETEROSCOPY AND STENT PLACEMENT Bilateral 06/26/2017   Procedure: CYSTOSCOPY WITH RETROGRADE PYELOGRAM, URETEROSCOPY, STONE BASKETRY  AND STENT PLACEMENT;  Surgeon: [name], MD;  Location: [facility];  Service: Urology;  Laterality: Bilateral;  . CYSTOSCOPY WITH RETROGRADE PYELOGRAM, URETEROSCOPY AND STENT PLACEMENT Right 09/18/2017   Procedure: CYSTOSCOPY WITH RETROGRADE PYELOGRAM, URETEROSCOPY AND STENT PLACEMENT;  Surgeon: Crist Fat, MD;  Location: [facility];  Service: Urology;  Laterality: Right;  . CYSTOSCOPY WITH RETROGRADE PYELOGRAM, URETEROSCOPY AND STENT PLACEMENT Bilateral 10/13/2017   Procedure: CYSTOSCOPY WITH  RETROGRADE PYELOGRAM, BILATERAL URETEROSCOPY AND BILATERAL STENT PLACEMENT, LASER;  Surgeon: [name], MD;  Location: [facility];  Service: Urology;  Laterality: Bilateral;  90 MINS  . CYSTOSCOPY WITH RETROGRADE PYELOGRAM, URETEROSCOPY AND STENT PLACEMENT Bilateral 03/24/2018   Procedure: CYSTOSCOPY WITH RETROGRADE PYELOGRAM, URETEROSCOPY AND STENT PLACEMENT;  Surgeon: [name], MD;  Location: [facility];  Service: Urology;  Laterality: Bilateral;  1 HR  . CYSTOSCOPY WITH RETROGRADE PYELOGRAM, URETEROSCOPY AND STENT PLACEMENT Bilateral 07/07/2018   Procedure: CYSTOSCOPY WITH RETROGRADE PYELOGRAM, URETEROSCOPY AND STENT PLACEMENT;  Surgeon: [name], MD;  Location: [facility];  Service: Urology;  Laterality: Bilateral;  75 MINUTES  . CYSTOSCOPY WITH RETROGRADE PYELOGRAM, URETEROSCOPY AND STENT PLACEMENT Left 01/12/2019   Procedure: CYSTOSCOPY WITH RETROGRADE PYELOGRAM, URETEROSCOPY AND STENT PLACEMENT;  Surgeon: [name], MD;  Location: [facility];  Service: Urology;  Laterality: Left;  90 MINS  . CYSTOSCOPY WITH RETROGRADE PYELOGRAM, URETEROSCOPY AND STENT PLACEMENT Left 01/28/2019   Procedure: CYSTOSCOPY WITH RETROGRADE PYELOGRAM, URETEROSCOPY AND STENT EXCHANGE STONE BASKET EXTRACTION ;  Surgeon: [name], MD;  Location: [facility];  Service: Urology;  Laterality: Left;  . CYSTOSCOPY WITH RETROGRADE PYELOGRAM, URETEROSCOPY AND STENT PLACEMENT Bilateral 05/13/2019   Procedure: CYSTOSCOPY WITH BILATERAL  RETROGRADE PYELOGRAM, LEFT URETEROSCOPY WITH HOLMIUM LASER AND STENT PLACEMENT;  Surgeon: [name], MD;  Location: [facility];  Service: Urology;  Laterality: Bilateral;  . CYSTOSCOPY/RETROGRADE/URETEROSCOPY/STONE EXTRACTION WITH BASKET Left 10/05/2017   Procedure: CYSTOSCOPY/RETROGRADE/STONE REMOVAL FROM BLADDER ;  Surgeon: Bjorn Pippin, MD;  Location: [facility];  Service: Urology;  Laterality: Left;  . CYSTOSCOPY/URETEROSCOPY/HOLMIUM LASER/STENT PLACEMENT Right 09/28/2017    Procedure: CYSTOSCOPY/RIGHT  RETROGRADE URETEROSCOPY/HOLMIUM LASER/ RIGHT STENT PLACEMENT;  Surgeon: Rene Paci, MD;  Location: [facility];  Service: Urology;  Laterality: Right;  . CYSTOSCOPY/URETEROSCOPY/HOLMIUM LASER/STENT PLACEMENT Bilateral 01/27/2018   Procedure: CYSTOSCOPY/URETEROSCOPY/HOLMIUM LASER/STENT PLACEMENT;  Surgeon: [name], MD;  Location: [facility];  Service: Urology;  Laterality: Bilateral;  . HOLMIUM LASER APPLICATION Bilateral 06/26/2017   Procedure: HOLMIUM LASER APPLICATION;  Surgeon: [name], MD;  Location: [facility];  Service: Urology;  Laterality: Bilateral;  . HOLMIUM LASER APPLICATION Right 09/18/2017   Procedure: HOLMIUM LASER APPLICATION;  Surgeon: Crist Fat, MD;  Location: [facility];  Service: Urology;  Laterality: Right;  . HOLMIUM LASER APPLICATION Bilateral 10/13/2017   Procedure: HOLMIUM LASER APPLICATION;  Surgeon: [name], MD;  Location: [facility];  Service: Urology;  Laterality: Bilateral;  . HOLMIUM LASER APPLICATION Bilateral 01/27/2018   Procedure: HOLMIUM LASER APPLICATION;  Surgeon: [name], MD;  Location: [facility];  Service: Urology;  Laterality: Bilateral;  . HOLMIUM LASER APPLICATION Bilateral 03/24/2018   Procedure: HOLMIUM LASER APPLICATION;  Surgeon: [name], MD;  Location: [facility];  Service: Urology;  Laterality: Bilateral;  . HOLMIUM LASER APPLICATION Bilateral 07/07/2018   Procedure: HOLMIUM LASER APPLICATION;  Surgeon: [name], MD;  Location: [facility];  Service: Urology;  Laterality: Bilateral;  . HOLMIUM LASER APPLICATION Left 01/12/2019   Procedure: HOLMIUM LASER APPLICATION;  Surgeon: [name], MD;  Location: [facility];  Service: Urology;  Laterality: Left;  . PERCUTANEOUS NEPHROSTOLITHOTOMY  2003;  2009;  06-22-2014; 01-30-2015  @ [facility]  . URETEROSCOPIC STONE MANIPULATION UNILATERAL  multiple since 1997--  last one 02-19-2017  @ [facility]        No family history on file.  Social History   Tobacco Use  . Smoking status: Never Smoker  . Smokeless tobacco: Never Used  Substance Use Topics  . Alcohol use: Never  . Drug use: Never    Home Medications Prior to Admission medications   Medication Sig Start Date End Date Taking? Authorizing Provider  cephALEXin (KEFLEX) 500 MG capsule Take 1 capsule (500 mg total) by mouth 2 (two) times daily. X 3 days to prevent infection with tethered stents. 05/13/19   [name], MD  HYDROcodone-acetaminophen (NORCO/VICODIN) 5-325 MG tablet Take 1-2 tablets by mouth every 6 (six) hours as needed. 05/01/19   [name] Core, MD  ketorolac (TORADOL) 10 MG tablet Take 1 tablet (10 mg total) by mouth every 8 (eight) hours as needed for moderate pain. Post-operatively 05/13/19   [name], MD  Potassium Citrate 15 MEQ (1620 MG) TBCR Take 2 tablets by mouth 2 (two) times daily. 07/14/18   [provider]    Allergies    Morphine and Other  Review of Systems   Review of Systems  Constitutional:       Per HPI, otherwise negative  HENT:       Per HPI, otherwise negative  Respiratory:       Per HPI, otherwise negative  Cardiovascular:       Per HPI, otherwise negative  Gastrointestinal: Negative for vomiting.  Endocrine:       Negative aside from HPI  Genitourinary:       Neg aside from HPI   Musculoskeletal:       Per HPI, otherwise negative  Skin: Negative.   Neurological: Negative for syncope.    Physical Exam Updated Vital Signs BP (!) 138/105 (BP Location: Left Arm)   Pulse 97   Temp 98.4 F (  36.9 C) (Oral)   Resp 18   Ht 5\' 10"  (1.778 m)   Wt 99.8 kg   SpO2 97%   BMI 31.57 kg/m   Physical Exam Vitals and nursing note reviewed.  Constitutional:      General: He is not in acute distress.    Appearance: He is well-developed.  HENT:     Head: Normocephalic and atraumatic.  Eyes:     Conjunctiva/sclera: Conjunctivae normal.  Cardiovascular:      Rate and Rhythm: Normal rate and regular rhythm.  Pulmonary:     Effort: Pulmonary effort is normal. No respiratory distress.     Breath sounds: No stridor.  Abdominal:     General: There is no distension.  Genitourinary:   Skin:    General: Skin is warm and dry.  Neurological:     Mental Status: He is alert and oriented to person, place, and time.     ED Results / Procedures / Treatments   Labs (all labs ordered are listed, but only abnormal results are displayed) Labs Reviewed  URINALYSIS, ROUTINE W REFLEX MICROSCOPIC - Abnormal; Notable for the following components:      Result Value   Color, Urine RED (*)    APPearance TURBID (*)    Hgb urine dipstick LARGE (*)    Ketones, ur 5 (*)    Protein, ur >300 (*)    Leukocytes,Ua SMALL (*)    All other components within normal limits  URINALYSIS, MICROSCOPIC (REFLEX)    Procedures Procedures (including critical care time)  Medications Ordered in ED Medications  HYDROmorphone (DILAUDID) injection 1 mg (has no administration in time range)    ED Course  I have reviewed the triage vital signs and the nursing notes.  Pertinent labs & imaging results that were available during my care of the patient were reviewed by me and considered in my medical decision making (see chart for details).  10:53 PM Patient has removed his ureteral stent.  He does have ongoing pain, but other wise has no new complaints.   This adult male with recurrent kidney stones, recent placement of ureteral stent presents with ongoing pain. Patient is awake, alert, afebrile, does have some mild tenderness in the area, but otherwise no evidence of peritonitis, bacteremia, sepsis, absent fever, other complaints. Patient did require substantial pain control here, but after removal of his stents, which was well-tolerated, was discharged in stable condition.  Urinalysis largely not useful secondary to substantial hemoglobin.  Patient will follow up with  urology.  Final Clinical Impression(s) / ED Diagnoses Final diagnoses:  Gross hematuria  Suprapubic pain     Carmin Muskrat, MD 05/15/19 2255

## 2019-05-15 NOTE — Discharge Instructions (Addendum)
As discussed, your evaluation today has been largely reassuring.  But, it is important that you monitor your condition carefully, and do not hesitate to return to the ED if you develop new, or concerning changes in your condition. ? ?Otherwise, please follow-up with your physician for appropriate ongoing care. ? ?

## 2019-10-11 ENCOUNTER — Other Ambulatory Visit: Payer: Self-pay | Admitting: Urology

## 2019-10-21 ENCOUNTER — Encounter (HOSPITAL_BASED_OUTPATIENT_CLINIC_OR_DEPARTMENT_OTHER): Payer: Self-pay | Admitting: Urology

## 2019-10-21 ENCOUNTER — Other Ambulatory Visit: Payer: Self-pay

## 2019-10-21 NOTE — Progress Notes (Signed)
Spoke w/ via phone for pre-op interview---pt Lab needs dos----    I stat 8 (gent)           Lab results------ekg 05-11-2019 epic COVID test ------10-29-2019 935 Arrive at -------945 am 11-02-2019 NPO after MN NO Solid Food.  Clear liquids from MN until---845 am then npo Medications to take morning of surgery -----none Diabetic medication -----n/a Patient Special Instructions -----none Pre-Op special Istructions -----none Patient verbalized understanding of instructions that were given at this phone interview. Patient denies shortness of breath, chest pain, fever, cough at this phone interview.

## 2019-10-29 ENCOUNTER — Other Ambulatory Visit (HOSPITAL_COMMUNITY)
Admission: RE | Admit: 2019-10-29 | Discharge: 2019-10-29 | Disposition: A | Discharge status: Home / Self Care | Payer: 59 | Source: Ambulatory Visit | Attending: Urology | Admitting: Urology

## 2019-10-29 DIAGNOSIS — Z20822 Contact with and (suspected) exposure to covid-19: Secondary | ICD-10-CM | POA: Insufficient documentation

## 2019-10-29 DIAGNOSIS — Z01812 Encounter for preprocedural laboratory examination: Secondary | ICD-10-CM | POA: Insufficient documentation

## 2019-10-29 LAB — SARS CORONAVIRUS 2 (TAT 6-24 HRS): SARS Coronavirus 2: NEGATIVE

## 2019-11-02 ENCOUNTER — Ambulatory Visit (HOSPITAL_BASED_OUTPATIENT_CLINIC_OR_DEPARTMENT_OTHER): Payer: 59 | Admitting: Anesthesiology

## 2019-11-02 ENCOUNTER — Encounter (HOSPITAL_BASED_OUTPATIENT_CLINIC_OR_DEPARTMENT_OTHER): Payer: Self-pay | Admitting: Urology

## 2019-11-02 ENCOUNTER — Encounter (HOSPITAL_BASED_OUTPATIENT_CLINIC_OR_DEPARTMENT_OTHER)
Admission: RE | Disposition: A | Discharge status: Home / Self Care | Payer: Self-pay | Source: Home / Self Care | Attending: Urology

## 2019-11-02 ENCOUNTER — Ambulatory Visit (HOSPITAL_BASED_OUTPATIENT_CLINIC_OR_DEPARTMENT_OTHER)
Admission: RE | Admit: 2019-11-02 | Discharge: 2019-11-02 | Disposition: A | Discharge status: Home / Self Care | Payer: 59 | Attending: Urology | Admitting: Urology

## 2019-11-02 DIAGNOSIS — Z885 Allergy status to narcotic agent status: Secondary | ICD-10-CM | POA: Insufficient documentation

## 2019-11-02 DIAGNOSIS — K219 Gastro-esophageal reflux disease without esophagitis: Secondary | ICD-10-CM | POA: Diagnosis not present

## 2019-11-02 DIAGNOSIS — Z6831 Body mass index (BMI) 31.0-31.9, adult: Secondary | ICD-10-CM | POA: Insufficient documentation

## 2019-11-02 DIAGNOSIS — E669 Obesity, unspecified: Secondary | ICD-10-CM | POA: Insufficient documentation

## 2019-11-02 DIAGNOSIS — Z87442 Personal history of urinary calculi: Secondary | ICD-10-CM | POA: Diagnosis not present

## 2019-11-02 DIAGNOSIS — Z91018 Allergy to other foods: Secondary | ICD-10-CM | POA: Diagnosis not present

## 2019-11-02 DIAGNOSIS — N200 Calculus of kidney: Secondary | ICD-10-CM | POA: Insufficient documentation

## 2019-11-02 DIAGNOSIS — E7201 Cystinuria: Secondary | ICD-10-CM | POA: Insufficient documentation

## 2019-11-02 HISTORY — PX: CYSTOSCOPY WITH RETROGRADE PYELOGRAM, URETEROSCOPY AND STENT PLACEMENT: SHX5789

## 2019-11-02 HISTORY — PX: HOLMIUM LASER APPLICATION: SHX5852

## 2019-11-02 LAB — POCT I-STAT, CHEM 8
BUN: 17 mg/dL (ref 6–20)
Calcium, Ion: 1.3 mmol/L (ref 1.15–1.40)
Chloride: 102 mmol/L (ref 98–111)
Creatinine, Ser: 0.9 mg/dL (ref 0.61–1.24)
Glucose, Bld: 93 mg/dL (ref 70–99)
HCT: 46 % (ref 39.0–52.0)
Hemoglobin: 15.6 g/dL (ref 13.0–17.0)
Potassium: 4.3 mmol/L (ref 3.5–5.1)
Sodium: 140 mmol/L (ref 135–145)
TCO2: 24 mmol/L (ref 22–32)

## 2019-11-02 SURGERY — CYSTOURETEROSCOPY, WITH RETROGRADE PYELOGRAM AND STENT INSERTION
Anesthesia: General | Site: Urethra | Laterality: Bilateral

## 2019-11-02 MED ORDER — FENTANYL CITRATE (PF) 100 MCG/2ML IJ SOLN
INTRAMUSCULAR | Status: DC | PRN
  Administered 2019-11-02: 50 ug via INTRAVENOUS

## 2019-11-02 MED ORDER — FENTANYL CITRATE (PF) 100 MCG/2ML IJ SOLN
INTRAMUSCULAR | Status: AC
  Filled 2019-11-02: qty 2

## 2019-11-02 MED ORDER — PROMETHAZINE HCL 25 MG/ML IJ SOLN: 6.2500 mg | INTRAMUSCULAR | Status: DC | PRN

## 2019-11-02 MED ORDER — DIPHENHYDRAMINE HCL 50 MG/ML IJ SOLN
INTRAMUSCULAR | Status: AC
  Filled 2019-11-02: qty 1

## 2019-11-02 MED ORDER — DEXAMETHASONE SODIUM PHOSPHATE 10 MG/ML IJ SOLN
INTRAMUSCULAR | Status: DC | PRN
  Administered 2019-11-02: 10 mg via INTRAVENOUS

## 2019-11-02 MED ORDER — MIDAZOLAM HCL 2 MG/2ML IJ SOLN
INTRAMUSCULAR | Status: DC | PRN
  Administered 2019-11-02: 2 mg via INTRAVENOUS

## 2019-11-02 MED ORDER — KETOROLAC TROMETHAMINE 30 MG/ML IJ SOLN
INTRAMUSCULAR | Status: DC | PRN
  Administered 2019-11-02: 30 mg via INTRAVENOUS

## 2019-11-02 MED ORDER — IOHEXOL 300 MG/ML  SOLN
INTRAMUSCULAR | Status: DC | PRN
  Administered 2019-11-02: 34 mL via URETHRAL

## 2019-11-02 MED ORDER — KETOROLAC TROMETHAMINE 10 MG PO TABS: 10.0000 mg | ORAL_TABLET | Freq: Three times a day (TID) | ORAL | 0 refills | Status: DC | PRN

## 2019-11-02 MED ORDER — PROPOFOL 500 MG/50ML IV EMUL
INTRAVENOUS | Status: DC | PRN
  Administered 2019-11-02: 200 mg via INTRAVENOUS

## 2019-11-02 MED ORDER — LIDOCAINE 2% (20 MG/ML) 5 ML SYRINGE
INTRAMUSCULAR | Status: AC
  Filled 2019-11-02: qty 5

## 2019-11-02 MED ORDER — KETOROLAC TROMETHAMINE 30 MG/ML IJ SOLN
INTRAMUSCULAR | Status: AC
  Filled 2019-11-02: qty 2

## 2019-11-02 MED ORDER — MIDAZOLAM HCL 2 MG/2ML IJ SOLN
INTRAMUSCULAR | Status: AC
  Filled 2019-11-02: qty 2

## 2019-11-02 MED ORDER — CEPHALEXIN 500 MG PO CAPS: 500.0000 mg | ORAL_CAPSULE | Freq: Two times a day (BID) | ORAL | 0 refills | Status: DC

## 2019-11-02 MED ORDER — ONDANSETRON HCL 4 MG/2ML IJ SOLN
INTRAMUSCULAR | Status: DC | PRN
  Administered 2019-11-02: 4 mg via INTRAVENOUS

## 2019-11-02 MED ORDER — FENTANYL CITRATE (PF) 100 MCG/2ML IJ SOLN
25.0000 ug | INTRAMUSCULAR | Status: DC | PRN
  Administered 2019-11-02: 50 ug via INTRAVENOUS

## 2019-11-02 MED ORDER — GENTAMICIN SULFATE 40 MG/ML IJ SOLN
5.0000 mg/kg | INTRAVENOUS | Status: AC
  Administered 2019-11-02: 420 mg via INTRAVENOUS
  Filled 2019-11-02: qty 10.5

## 2019-11-02 MED ORDER — DEXAMETHASONE SODIUM PHOSPHATE 10 MG/ML IJ SOLN
INTRAMUSCULAR | Status: AC
  Filled 2019-11-02: qty 1

## 2019-11-02 MED ORDER — LACTATED RINGERS IV SOLN: INTRAVENOUS | Status: DC

## 2019-11-02 MED ORDER — OXYCODONE-ACETAMINOPHEN 5-325 MG PO TABS
1.0000 | ORAL_TABLET | Freq: Three times a day (TID) | ORAL | 0 refills | Status: DC | PRN
Start: 2019-11-02 — End: 2020-04-19

## 2019-11-02 MED ORDER — LIDOCAINE 2% (20 MG/ML) 5 ML SYRINGE
INTRAMUSCULAR | Status: DC | PRN
  Administered 2019-11-02: 80 mg via INTRAVENOUS

## 2019-11-02 MED ORDER — DIPHENHYDRAMINE HCL 50 MG/ML IJ SOLN
25.0000 mg | Freq: Once | INTRAMUSCULAR | Status: AC
  Administered 2019-11-02: 25 mg via INTRAVENOUS

## 2019-11-02 MED ORDER — ONDANSETRON HCL 4 MG/2ML IJ SOLN
INTRAMUSCULAR | Status: AC
  Filled 2019-11-02: qty 2

## 2019-11-02 MED ORDER — PROPOFOL 10 MG/ML IV BOLUS
INTRAVENOUS | Status: AC
  Filled 2019-11-02: qty 20

## 2019-11-02 SURGICAL SUPPLY — 22 items
BAG DRAIN URO-CYSTO SKYTR STRL (DRAIN) ×3 IMPLANT
BAG DRN UROCATH (DRAIN) ×2
CATH INTERMIT  6FR 70CM (CATHETERS) ×1 IMPLANT
CLOTH BEACON ORANGE TIMEOUT ST (SAFETY) ×3 IMPLANT
DRSG TEGADERM 2-3/8X2-3/4 SM (GAUZE/BANDAGES/DRESSINGS) ×1 IMPLANT
FIBER LASER TRAC TIP (UROLOGICAL SUPPLIES) ×1 IMPLANT
GLOVE BIO SURGEON STRL SZ7 (GLOVE) ×1 IMPLANT
GLOVE BIO SURGEON STRL SZ7.5 (GLOVE) ×3 IMPLANT
GOWN STRL REUS W/TWL LRG LVL3 (GOWN DISPOSABLE) ×4 IMPLANT
GUIDEWIRE ANG ZIPWIRE 038X150 (WIRE) ×4 IMPLANT
GUIDEWIRE STR DUAL SENSOR (WIRE) ×4 IMPLANT
IV NS IRRIG 3000ML ARTHROMATIC (IV SOLUTION) ×3 IMPLANT
KIT TURNOVER CYSTO (KITS) ×3 IMPLANT
MANIFOLD NEPTUNE II (INSTRUMENTS) ×3 IMPLANT
NS IRRIG 500ML POUR BTL (IV SOLUTION) ×3 IMPLANT
PACK CYSTO (CUSTOM PROCEDURE TRAY) ×3 IMPLANT
SHEATH URETERAL 12FRX35CM (MISCELLANEOUS) ×1 IMPLANT
STENT POLARIS 5FRX24 (STENTS) ×2 IMPLANT
SYR 10ML LL (SYRINGE) ×3 IMPLANT
TUBE CONNECTING 12X1/4 (SUCTIONS) ×3 IMPLANT
TUBE FEEDING 8FR 16IN STR KANG (MISCELLANEOUS) ×1 IMPLANT
TUBING UROLOGY SET (TUBING) ×3 IMPLANT

## 2019-11-02 NOTE — Anesthesia Postprocedure Evaluation (Signed)
Anesthesia Post Note  Patient: Devin Sanders  Procedure(s) Performed: CYSTOSCOPY WITH RETROGRADE PYELOGRAM, URETEROSCOPY AND STENT PLACEMENT (Bilateral Groin) HOLMIUM LASER APPLICATION (Bilateral Urethra)     Patient location during evaluation: PACU Anesthesia Type: General Level of consciousness: sedated Pain management: pain level controlled Vital Signs Assessment: post-procedure vital signs reviewed and stable Respiratory status: spontaneous breathing and respiratory function stable Cardiovascular status: stable Postop Assessment: no apparent nausea or vomiting Anesthetic complications: no   No complications documented.  Last Vitals:  Vitals:   11/02/19 1314 11/02/19 1315  BP:    Pulse: 74 86  Resp: 11 18  Temp:    SpO2: 97% 97%    Last Pain:  Vitals:   11/02/19 1315  TempSrc:   PainSc: 7                  Candra R Valory Wetherby

## 2019-11-02 NOTE — Anesthesia Preprocedure Evaluation (Signed)
Anesthesia Evaluation  Patient identified by MRN, date of birth, ID band Patient awake    Reviewed: Allergy & Precautions, NPO status , Patient's Chart, lab work & pertinent test results  Airway Mallampati: I  TM Distance: >3 FB Neck ROM: Full    Dental   Pulmonary neg pulmonary ROS,    Pulmonary exam normal        Cardiovascular Exercise Tolerance: Good negative cardio ROS Normal cardiovascular exam     Neuro/Psych negative neurological ROS  negative psych ROS   GI/Hepatic GERD  Medicated and Controlled,  Endo/Other  negative endocrine ROS  Renal/GU Renal disease  negative genitourinary   Musculoskeletal negative musculoskeletal ROS (+)   Abdominal   Peds negative pediatric ROS (+)  Hematology negative hematology ROS (+)   Anesthesia Other Findings   Reproductive/Obstetrics negative OB ROS                             Anesthesia Physical  Anesthesia Plan  ASA: II  Anesthesia Plan: General   Post-op Pain Management:    Induction: Intravenous  PONV Risk Score and Plan: 2 and Ondansetron and Midazolam  Airway Management Planned: LMA  Additional Equipment:   Intra-op Plan:   Post-operative Plan: Extubation in OR  Informed Consent: I have reviewed the patients History and Physical, chart, labs and discussed the procedure including the risks, benefits and alternatives for the proposed anesthesia with the patient or authorized representative who has indicated his/her understanding and acceptance.       Plan Discussed with: CRNA and Surgeon  Anesthesia Plan Comments:         Anesthesia Quick Evaluation

## 2019-11-02 NOTE — Discharge Instructions (Signed)
1 - You may have urinary urgency (bladder spasms) and bloody urine on / off with stent in place. This is normal.  2 - Remove tethered stents on Friday morning at home by pulling on strings, then blue-white plastic tubing, and discarding. Office is open Friday if problems arise.   3 - Call MD or go to ER for fever >102, severe pain / nausea / vomiting not relieved by medications, or acute change in medical status  CYSTOSCOPY HOME CARE INSTRUCTIONS  Activity: Rest for the remainder of the day.  Do not drive or operate equipment today.  You may resume normal activities in one to two days as instructed by your physician.   Meals: Drink plenty of liquids and eat light foods such as gelatin or soup this evening.  You may return to a normal meal plan tomorrow.  Return to Work: You may return to work in one to two days or as instructed by your physician.  Special Instructions / Symptoms: Call your physician if any of these symptoms occur:   -persistent or heavy bleeding  -bleeding which continues after first few urination  -large blood clots that are difficult to pass  -urine stream diminishes or stops completely  -fever equal to or higher than 101 degrees Farenheit.  -cloudy urine with a strong, foul odor  -severe pain  Females should always wipe from front to back after elimination.  You may feel some burning pain when you urinate.  This should disappear with time.  Applying moist heat to the lower abdomen or a hot tub bath may help relieve the pain. \  Follow-Up / Date of Return Visit to Your Physician: as instructed Call for an appointment to arrange follow-up.   Post Anesthesia Home Care Instructions  Activity: Get plenty of rest for the remainder of the day. A responsible individual must stay with you for 24 hours following the procedure.  For the next 24 hours, DO NOT: -Drive a car -Advertising copywriter -Drink alcoholic beverages -Take any medication unless instructed by your  physician -Make any legal decisions or sign important papers.  Meals: Start with liquid foods such as gelatin or soup. Progress to regular foods as tolerated. Avoid greasy, spicy, heavy foods. If nausea and/or vomiting occur, drink only clear liquids until the nausea and/or vomiting subsides. Call your physician if vomiting continues.  Special Instructions/Symptoms: Your throat may feel dry or sore from the anesthesia or the breathing tube placed in your throat during surgery. If this causes discomfort, gargle with warm salt water. The discomfort should disappear within 24 hours.  If you had a scopolamine patch placed behind your ear for the management of post- operative nausea and/or vomiting:  1. The medication in the patch is effective for 72 hours, after which it should be removed.  Wrap patch in a tissue and discard in the trash. Wash hands thoroughly with soap and water. 2. You may remove the patch earlier than 72 hours if you experience unpleasant side effects which may include dry mouth, dizziness or visual disturbances. 3. Avoid touching the patch. Wash your hands with soap and water after contact with the patch.

## 2019-11-02 NOTE — H&P (Signed)
Devin Sanders is an 39 y.o. male.    Chief Complaint: Pre-Op BILATERAL Ureteroscopic Stone Manipulation  HPI:   1 - Recurrent Nephrolithiasis -  Pre 2019 - PCNL several each side, URS x innumerable each side at [facility].  02/2017 - left URS for 1.5cm stone to stone reduction (not stone free) at [facility] per OR notes  09/2017 - Rt ureteroscopy / Lt medical passage Berneice Heinrich / Annabell Howells); 10/2017 - bilateral URS to stone free  01/2018 - bilateral URS to stone free  01/2019 -bilateral stage urs to stone free; 05/2019 - bilateral URS to stone free; 09/2019 [place] - 1cm RUP, 1cm RLP, 1.4cm LUP no hydro   PMH sig for mild obesity. 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 His PCP is [name] MD.   Today " Devin Sanders " is seen to proceed with BILATERAL ureteroscoyp with goal of stone free. NO interva fevers. C19 screen negative. Most recent UA without infectious parameters.    Past Medical History:  Diagnosis Date  . COVID 02/2018   asymptomatic was exposed  . 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 GERD (gastroesophageal reflux disease)    pt denied  . History of kidney stones   . Left ureteral calculus   . Renal calculus, left   . Wears glasses     Past Surgical History:  Procedure Laterality Date  . CYSTOSCOPY WITH RETROGRADE PYELOGRAM, URETEROSCOPY AND STENT PLACEMENT Bilateral 06/26/2017   Procedure: CYSTOSCOPY WITH RETROGRADE PYELOGRAM, URETEROSCOPY, STONE BASKETRY  AND STENT PLACEMENT;  Surgeon: [name], MD;  Location: [facility];  Service: Urology;  Laterality: Bilateral;  . CYSTOSCOPY WITH RETROGRADE PYELOGRAM, URETEROSCOPY AND STENT PLACEMENT Right 09/18/2017   Procedure: CYSTOSCOPY WITH RETROGRADE PYELOGRAM, URETEROSCOPY AND STENT PLACEMENT;  Surgeon: Crist Fat, MD;  Location: [facility];  Service: Urology;  Laterality: Right;  . CYSTOSCOPY WITH RETROGRADE PYELOGRAM, URETEROSCOPY AND STENT PLACEMENT Bilateral 10/13/2017    Procedure: CYSTOSCOPY WITH RETROGRADE PYELOGRAM, BILATERAL URETEROSCOPY AND BILATERAL STENT PLACEMENT, LASER;  Surgeon: [name], MD;  Location: [facility];  Service: Urology;  Laterality: Bilateral;  90 MINS  . CYSTOSCOPY WITH RETROGRADE PYELOGRAM, URETEROSCOPY AND STENT PLACEMENT Bilateral 03/24/2018   Procedure: CYSTOSCOPY WITH RETROGRADE PYELOGRAM, URETEROSCOPY AND STENT PLACEMENT;  Surgeon: [name], MD;  Location: [facility];  Service: Urology;  Laterality: Bilateral;  1 HR  . CYSTOSCOPY WITH RETROGRADE PYELOGRAM, URETEROSCOPY AND STENT PLACEMENT Bilateral 07/07/2018   Procedure: CYSTOSCOPY WITH RETROGRADE PYELOGRAM, URETEROSCOPY AND STENT PLACEMENT;  Surgeon: [name], MD;  Location: [facility];  Service: Urology;  Laterality: Bilateral;  75 MINUTES  . CYSTOSCOPY WITH RETROGRADE PYELOGRAM, URETEROSCOPY AND STENT PLACEMENT Left 01/12/2019   Procedure: CYSTOSCOPY WITH RETROGRADE PYELOGRAM, URETEROSCOPY AND STENT PLACEMENT;  Surgeon: [name], MD;  Location: [facility];  Service: Urology;  Laterality: Left;  90 MINS  . CYSTOSCOPY WITH RETROGRADE PYELOGRAM, URETEROSCOPY AND STENT PLACEMENT Left 01/28/2019   Procedure: CYSTOSCOPY WITH RETROGRADE PYELOGRAM, URETEROSCOPY AND STENT EXCHANGE STONE BASKET EXTRACTION ;  Surgeon: [name], MD;  Location: [facility];  Service: Urology;  Laterality: Left;  . CYSTOSCOPY WITH RETROGRADE PYELOGRAM, URETEROSCOPY AND STENT PLACEMENT Bilateral 05/13/2019   Procedure: CYSTOSCOPY WITH BILATERAL  RETROGRADE PYELOGRAM, LEFT URETEROSCOPY WITH HOLMIUM LASER AND STENT PLACEMENT;  Surgeon: [name], MD;  Location: [facility];  Service: Urology;  Laterality: Bilateral;  . CYSTOSCOPY/RETROGRADE/URETEROSCOPY/STONE EXTRACTION WITH BASKET Left 10/05/2017   Procedure: CYSTOSCOPY/RETROGRADE/STONE REMOVAL FROM BLADDER ;  Surgeon: Annabell Howells,  Jonny Ruiz, MD;  Location: [facility];  Service: Urology;  Laterality: Left;  . CYSTOSCOPY/URETEROSCOPY/HOLMIUM LASER/STENT  PLACEMENT Right 09/28/2017   Procedure: CYSTOSCOPY/RIGHT RETROGRADE URETEROSCOPY/HOLMIUM LASER/ RIGHT STENT PLACEMENT;  Surgeon: Rene Paci, MD;  Location: [facility];  Service: Urology;  Laterality: Right;  . CYSTOSCOPY/URETEROSCOPY/HOLMIUM LASER/STENT PLACEMENT Bilateral 01/27/2018   Procedure: CYSTOSCOPY/URETEROSCOPY/HOLMIUM LASER/STENT PLACEMENT;  Surgeon: [name], MD;  Location: [facility];  Service: Urology;  Laterality: Bilateral;  . HOLMIUM LASER APPLICATION Bilateral 06/26/2017   Procedure: HOLMIUM LASER APPLICATION;  Surgeon: [name], MD;  Location: [facility];  Service: Urology;  Laterality: Bilateral;  . HOLMIUM LASER APPLICATION Right 09/18/2017   Procedure: HOLMIUM LASER APPLICATION;  Surgeon: Crist Fat, MD;  Location: [facility];  Service: Urology;  Laterality: Right;  . HOLMIUM LASER APPLICATION Bilateral 10/13/2017   Procedure: HOLMIUM LASER APPLICATION;  Surgeon: [name], MD;  Location: [facility];  Service: Urology;  Laterality: Bilateral;  . HOLMIUM LASER APPLICATION Bilateral 01/27/2018   Procedure: HOLMIUM LASER APPLICATION;  Surgeon: [name], MD;  Location: [facility];  Service: Urology;  Laterality: Bilateral;  . HOLMIUM LASER APPLICATION Bilateral 03/24/2018   Procedure: HOLMIUM LASER APPLICATION;  Surgeon: [name], MD;  Location: [facility];  Service: Urology;  Laterality: Bilateral;  . HOLMIUM LASER APPLICATION Bilateral 07/07/2018   Procedure: HOLMIUM LASER APPLICATION;  Surgeon: [name], MD;  Location: [facility];  Service: Urology;  Laterality: Bilateral;  . HOLMIUM LASER APPLICATION Left 01/12/2019   Procedure: HOLMIUM LASER APPLICATION;  Surgeon: [name], MD;  Location: [facility];  Service: Urology;  Laterality: Left;  . PERCUTANEOUS NEPHROSTOLITHOTOMY  2003;  2009;  06-22-2014; 01-30-2015  @ [facility]  . URETEROSCOPIC STONE MANIPULATION UNILATERAL  multiple since 1997--  last  one 02-19-2017  @ [facility]    History reviewed. No pertinent family history. Social History:  reports that he has never smoked. He has never used smokeless tobacco. He reports that he does not drink alcohol and does not use drugs.  Allergies:  Allergies  Allergen Reactions  . Morphine Nausea And Vomiting  . Other Hives and Swelling    [place]    No medications prior to admission.    No results found for this or any previous visit (from the past 48 hour(s)). No results found.  Review of Systems  Constitutional: Negative for chills and fever.  Genitourinary: Positive for flank pain.  All other systems reviewed and are negative.   Height 5\' 10"  (1.778 m), weight 95.3 kg. Physical Exam Vitals reviewed.  HENT:     Head: Normocephalic.     Nose: Nose normal.     Mouth/Throat:     Mouth: Mucous membranes are moist.  Eyes:     Pupils: Pupils are equal, round, and reactive to light.  Cardiovascular:     Rate and Rhythm: Normal rate.     Pulses: Normal pulses.  Pulmonary:     Effort: Pulmonary effort is normal.  Abdominal:     General: Abdomen is flat.  Genitourinary:    Comments: Mild bilateral CVAT.  Musculoskeletal:     Cervical back: Normal range of motion.  Skin:    General: Skin is warm.  Neurological:     General: No focal deficit present.     Mental Status: He is alert.  Psychiatric:        Mood and Affect: Mood normal.      Assessment/Plan  Proceed as planned with BIALTERAL ureteroscopic stone manipulation. Risks, benefits,  alternatives, expected peri-op cousre discussed previously and reiterated todayl  [name], MD 11/02/2019, 5:20 AM

## 2019-11-02 NOTE — Op Note (Signed)
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  PROCEDURE: 1.  Cystoscopy, bilateral retrograde pyelograms, interpretation. 2.  Bilateral ureteroscopy, laser lithotripsy and insertion of bilateral ureteral stents, 5 x 24 Polaris with tether.  ESTIMATED BLOOD LOSS:  Nil.  COMPLICATIONS:  None   SPECIMEN:  Bilateral renal stone fragments for discard.  FINDINGS: 1.  Bilateral papillary tip calcifications, total volume approximately 8 mm each side. 2.  Complete resolution of all accessible stone fragments larger than one-third mm following laser lithotripsy. 3.  Successful placement of bilateral ureteral stents, proximal end in renal pelvis, distal end in the urinary bladder.  INDICATIONS:  The patient is a very pleasant 39 year old man with history of severe cystinuria.  He is status post innumerable prior surgeries, including prior percutaneous surgery and most recently frequent ureteroscopy.  In effort to avoid further  percutaneous surgeries, he has been very compliant with this, as well as medical therapy for his cystinuria.  He was found on surveillance imaging to have recurrence of bilateral stone volume, estimated on imaging approximately 1 to 1.5 cm each side.  We  discussed options including continued surveillance versus preemptive ureteroscopy, again with the goal of preventing progression towards percutaneous surgery and he wished to proceed with bilateral ureteroscopy with goal of stone free.  Informed consent  was obtained and placed in the medical record.  DESCRIPTION OF PROCEDURE:  The patient being identified, the procedure being bilateral ureteroscopic stone manipulation stimulation was  confirmed.  Procedure timeout was performed.  Intravenous antibiotics were administered.  General anesthesia induced.   The patient was placed into a low lithotomy position.  Sterile field was created, prepping and draping the patient's penis, perineum and proximal thighs using iodine.  Cystourethroscopy was performed using 21-French rigid cystoscope with offset lens.   Inspection of anterior and posterior urethra was unremarkable.  Inspection of bladder revealed no diverticula, calcifications, papillary lesions.  Ureteral orifices were singleton bilaterally.  The right ureteral orifice was cannulated with a 6-French  renal catheter and right retrograde pyelogram was obtained.  Right retrograde pyelogram demonstrated a single right ureter, single system right kidney.  No large filling defects noted.  A ZIPwire was advanced to the lower of the upper pole and set aside as a safety wire.   Next, left retrograde pyelogram was  obtained.  Left retrograde pyelogram demonstrated a single left ureter, single system left kidney.  No significant filling defects were noted.  A separate ZIPwire was advanced to the level of the upper pole and set aside as a safety wire.  An 8-French feeding tube  was placed in the urinary bladder for pressure release.  Semirigid ureteroscopy was performed of the distal four-fifths of the left ureter alongside a separate sensor working wire.  No mucosal findings were found.  Next, semirigid ureteroscopy was  performed of the distal orifice of the right ureter alongside a separate sensor working wire.  No mucosal abnormalities were found.  Next, a semirigid scope was exchanged for a 12/14 medium length ureteral access sheath to the level of the proximal  ureter using continuous fluoroscopic guidance over the right-sided working wire and flexible digital ureteroscopy was performed of the proximal right ureter and systematic inspection of the right kidney, including all calices x3.  There  was multifocal  papillary tip calcifications, minimal volume of free floating intraluminal stone.  This was quite less volume than expected, but also fortunate for the patient.  The papillary tip calcifications were all amenable to laser lithotripsy using settings of  0.2 joules and 20 Hz.  All accessible papillary tip calcifications were ablated using a dusting technique.  Following this, all accessible stone fragments larger than one-third mm had been addressed.  Access sheath was removed under continuous vision.   No significant mucosal abnormalities were found.  Next, the access sheath was placed over the left sensor working wire to the level of the proximal left ureter using continuous fluoroscopic guidance and flexible digital ureteroscopy was performed of the  proximal left ureter and systematic inspection of the kidney and all calices x3.  Similarly, there was multifocal papillary tip calcifications, estimated total volume approximately 8 mm2.  No free floating stones.  There was also found to be quite  fortunate and somewhat of a less stone volume anticipated.  Similarly, a holmium was energy applied at 70 setting of 0.2 joules and 20 Hz.  All accessible stone fragments were addressed.  Access sheath was removed under continuous vision.  No mucosal  abnormalities were found.  Given the bilateral nature of the procedure today, it was felt that brief interval stenting with tethered stents would be warranted.  As such, new 5 x 24 Polaris-type stents were placed over the remaining safety wire using  fluoroscopic guidance.  Good proximal and distal planes were noted.  Tether was left in place and fashioned to the dorsum of the penis.  The procedure was terminated.  The patient tolerated the procedure well.  There were no immediate perioperative  complications.  The patient was taken to postanesthesia care unit in stable condition with a plan for discharge home.  VN/NUANCE  D:11/02/2019 T:11/02/2019  JOB:[id number]

## 2019-11-02 NOTE — Anesthesia Procedure Notes (Signed)
Procedure Name: LMA Insertion Date/Time: 11/02/2019 11:14 AM Performed by: Basilio Cairo, CRNA Pre-anesthesia Checklist: Patient identified, Patient being monitored, Timeout performed, Emergency Drugs available and Suction available Patient Re-evaluated:Patient Re-evaluated prior to induction Oxygen Delivery Method: Circle system utilized Preoxygenation: Pre-oxygenation with 100% oxygen Induction Type: IV induction Ventilation: Mask ventilation without difficulty LMA: LMA inserted LMA Size: 4.0 Tube type: Oral Number of attempts: 1 Placement Confirmation: positive ETCO2 and breath sounds checked- equal and bilateral Tube secured with: Tape Dental Injury: Teeth and Oropharynx as per pre-operative assessment

## 2019-11-02 NOTE — Brief Op Note (Signed)
11/02/2019  11:50 AM  PATIENT:  Devin Sanders  39 y.o. male  PRE-OPERATIVE DIAGNOSIS:  RECURRENT BILATERAL STONES  POST-OPERATIVE DIAGNOSIS:  RECURRENT BILATERAL STONES  PROCEDURE:  Procedure(s) with comments: CYSTOSCOPY WITH RETROGRADE PYELOGRAM, URETEROSCOPY AND STENT PLACEMENT (Bilateral) - 90 MINS HOLMIUM LASER APPLICATION (Bilateral)  SURGEON:  Surgeon(s) and Role:    [name], MD - Primary  PHYSICIAN ASSISTANT:   ASSISTANTS: none   ANESTHESIA:   general  EBL:  minimal   BLOOD ADMINISTERED:none  DRAINS: none   LOCAL MEDICATIONS USED:  NONE  SPECIMEN:  Source of Specimen:  renal stone fragments  DISPOSITION OF SPECIMEN:  discard  COUNTS:  YES  TOURNIQUET:  * No tourniquets in log *  DICTATION: .Other Dictation: Dictation Number [id number]  PLAN OF CARE: Discharge to home after PACU  PATIENT DISPOSITION:  PACU - hemodynamically stable.   Delay start of Pharmacological VTE agent (>24hrs) due to surgical blood loss or risk of bleeding: yes

## 2019-11-02 NOTE — Transfer of Care (Signed)
Immediate Anesthesia Transfer of Care Note  Patient: Devin Sanders  Procedure(s) Performed: Procedure(s) with comments: CYSTOSCOPY WITH RETROGRADE PYELOGRAM, URETEROSCOPY AND STENT PLACEMENT (Bilateral) - 90 MINS HOLMIUM LASER APPLICATION (Bilateral)  Patient Location: PACU  Anesthesia Type:General  Level of Consciousness: Alert, Awake, Oriented  Airway & Oxygen Therapy: Patient Spontanous Breathing  Post-op Assessment: Report given to RN  Post vital signs: Reviewed and stable  Last Vitals:  Vitals:   11/02/19 0945 11/02/19 1158  BP: (!) 137/102 (P) 133/85  Pulse: 82 (P) 70  Resp: 16 (!) (P) 6  Temp: (!) 36.1 C (P) 36.5 C  SpO2: 97% (P) 97%    Complications: No apparent anesthesia complications

## 2019-11-03 ENCOUNTER — Encounter (HOSPITAL_BASED_OUTPATIENT_CLINIC_OR_DEPARTMENT_OTHER): Payer: Self-pay | Admitting: Urology

## 2020-04-19 ENCOUNTER — Emergency Department (HOSPITAL_COMMUNITY): Payer: PRIVATE HEALTH INSURANCE

## 2020-04-19 ENCOUNTER — Emergency Department (HOSPITAL_COMMUNITY)
Admission: EM | Admit: 2020-04-19 | Discharge: 2020-04-19 | Disposition: A | Discharge status: Home / Self Care | Payer: PRIVATE HEALTH INSURANCE | Attending: Emergency Medicine | Admitting: Emergency Medicine

## 2020-04-19 ENCOUNTER — Other Ambulatory Visit: Payer: Self-pay

## 2020-04-19 ENCOUNTER — Encounter (HOSPITAL_COMMUNITY): Payer: Self-pay

## 2020-04-19 DIAGNOSIS — K219 Gastro-esophageal reflux disease without esophagitis: Secondary | ICD-10-CM | POA: Insufficient documentation

## 2020-04-19 DIAGNOSIS — R112 Nausea with vomiting, unspecified: Secondary | ICD-10-CM

## 2020-04-19 DIAGNOSIS — N202 Calculus of kidney with calculus of ureter: Secondary | ICD-10-CM | POA: Diagnosis not present

## 2020-04-19 DIAGNOSIS — N201 Calculus of ureter: Secondary | ICD-10-CM

## 2020-04-19 DIAGNOSIS — R109 Unspecified abdominal pain: Secondary | ICD-10-CM | POA: Diagnosis present

## 2020-04-19 DIAGNOSIS — Z8616 Personal history of COVID-19: Secondary | ICD-10-CM | POA: Insufficient documentation

## 2020-04-19 LAB — URINALYSIS, ROUTINE W REFLEX MICROSCOPIC
Bacteria, UA: NONE SEEN
Glucose, UA: NEGATIVE mg/dL
Ketones, ur: NEGATIVE mg/dL
Leukocytes,Ua: NEGATIVE
Nitrite: NEGATIVE
Protein, ur: 30 mg/dL — AB
Specific Gravity, Urine: 1.02 (ref 1.005–1.030)
pH: 7 (ref 5.0–8.0)

## 2020-04-19 LAB — BASIC METABOLIC PANEL
Anion gap: 7 (ref 5–15)
BUN: 23 mg/dL — ABNORMAL HIGH (ref 6–20)
CO2: 25 mmol/L (ref 22–32)
Calcium: 9.3 mg/dL (ref 8.9–10.3)
Chloride: 106 mmol/L (ref 98–111)
Creatinine, Ser: 1.07 mg/dL (ref 0.61–1.24)
GFR, Estimated: >60 mL/min (ref >60)
Glucose, Bld: 122 mg/dL — ABNORMAL HIGH (ref 70–99)
Potassium: 3.7 mmol/L (ref 3.5–5.1)
Sodium: 138 mmol/L (ref 135–145)

## 2020-04-19 MED ORDER — ONDANSETRON HCL 4 MG/2ML IJ SOLN
4.0000 mg | Freq: Once | INTRAMUSCULAR | Status: AC
  Administered 2020-04-19: 4 mg via INTRAVENOUS
  Filled 2020-04-19: qty 2

## 2020-04-19 MED ORDER — HYDROMORPHONE HCL 1 MG/ML IJ SOLN
1.0000 mg | Freq: Once | INTRAMUSCULAR | Status: DC
  Filled 2020-04-19: qty 1

## 2020-04-19 MED ORDER — KETOROLAC TROMETHAMINE 30 MG/ML IJ SOLN
30.0000 mg | Freq: Once | INTRAMUSCULAR | Status: AC
  Administered 2020-04-19: 30 mg via INTRAVENOUS
  Filled 2020-04-19: qty 1

## 2020-04-19 MED ORDER — DIPHENHYDRAMINE HCL 50 MG/ML IJ SOLN
12.5000 mg | Freq: Once | INTRAMUSCULAR | Status: AC
  Administered 2020-04-19: 12.5 mg via INTRAVENOUS
  Filled 2020-04-19: qty 1

## 2020-04-19 MED ORDER — HYDROMORPHONE HCL 1 MG/ML IJ SOLN
0.5000 mg | Freq: Once | INTRAMUSCULAR | Status: AC
  Administered 2020-04-19: 0.5 mg via INTRAVENOUS
  Filled 2020-04-19: qty 1

## 2020-04-19 MED ORDER — OXYCODONE HCL 5 MG PO TABS: 5.0000 mg | ORAL_TABLET | ORAL | 0 refills | Status: DC | PRN

## 2020-04-19 MED ORDER — OXYCODONE-ACETAMINOPHEN 5-325 MG PO TABS
1.0000 | ORAL_TABLET | Freq: Once | ORAL | Status: AC
Start: 2020-04-19 — End: 2020-04-19
  Administered 2020-04-19: 1 via ORAL
  Filled 2020-04-19: qty 1

## 2020-04-19 MED ORDER — TAMSULOSIN HCL 0.4 MG PO CAPS: 0.4000 mg | ORAL_CAPSULE | Freq: Every day | ORAL | 0 refills | Status: DC

## 2020-04-19 NOTE — ED Triage Notes (Signed)
Patient started having sharp 10/10 right lower abdomen shooting to the back. He has already passed one kidney stone. He states he has several stones to pass. 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

## 2020-04-19 NOTE — Discharge Instructions (Addendum)
Thank you for allowing me to care for you today in the Emergency Department.   Your CT scanning did not show that you had any other stones that you are about to pass.  Several stones were noted in your kidneys and there is a possible small stone that has already made it to your bladder.  Take 650 mg of Tylenol or 600 mg of ibuprofen with food every 6 hours for pain.  You can alternate between these 2 medications every 3 hours if your pain returns.  For instance, you can take Tylenol at noon, followed by a dose of ibuprofen at 3, followed by second dose of Tylenol and 6.  For severe, uncontrollable pain, you can take 1 dose of oxycodone every 4 hours as needed for severe pain.  This medication is a narcotic.  It can be addicting.  He should not take it with other substances or medications that can cause you to be sedated, such as alcohol.  Take 1 tablet of tamsulosin daily if you feel as if you are passing a stone to make it easier to pass.  Follow-up with urology.  Return to the emergency department if you stop producing urine, develop uncontrollable pain, persistent vomiting despite taking your home nausea medication, if you start having severe pain with a fever, or other new, concerning symptoms.

## 2020-04-19 NOTE — ED Provider Notes (Signed)
[facility]-EMERGENCY DEPT Provider Note   CSN: [id number] Arrival date & time: 04/19/20  0020     History Chief Complaint  Patient presents with  . Flank Pain    Devin Sanders is a 40 y.o. 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  He characterizes the pain as sharp and radiating down to the suprapubic region.  No known aggravating relieving factors he states that his symptoms feel similar to passing previous kidney stones.  Reports that he passed a stone prior to arrival, but suspects that he has another stone.  He reports associated nausea and nonbloody, nonbilious emesis.  He denies hematuria, fever, chills, dysuria, urinary retention, chest pain, shortness of breath, penile or testicular pain or swelling.  He attempted to treat his symptoms with Motrin and Tylenol prior to arrival without improvement in his symptoms.   The history is provided by the patient and medical records. No language interpreter was used.       Past Medical History:  Diagnosis Date  . COVID 02/2018   asymptomatic was exposed  . 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 GERD (gastroesophageal reflux disease)    pt denied  . History of kidney stones   . Left ureteral calculus   . Renal calculus, left   . Wears glasses     Patient Active Problem List   Diagnosis Date Noted  . Hydronephrosis of right kidney 09/28/2017    Past Surgical History:  Procedure Laterality Date  . CYSTOSCOPY WITH RETROGRADE PYELOGRAM, URETEROSCOPY AND STENT PLACEMENT Bilateral 06/26/2017   Procedure: CYSTOSCOPY WITH RETROGRADE PYELOGRAM, URETEROSCOPY, STONE BASKETRY  AND STENT PLACEMENT;  Surgeon: [name], MD;  Location: [facility];  Service: Urology;  Laterality: Bilateral;  . CYSTOSCOPY WITH RETROGRADE PYELOGRAM,  URETEROSCOPY AND STENT PLACEMENT Right 09/18/2017   Procedure: CYSTOSCOPY WITH RETROGRADE PYELOGRAM, URETEROSCOPY AND STENT PLACEMENT;  Surgeon: Crist Fat, MD;  Location: [facility];  Service: Urology;  Laterality: Right;  . CYSTOSCOPY WITH RETROGRADE PYELOGRAM, URETEROSCOPY AND STENT PLACEMENT Bilateral 10/13/2017   Procedure: CYSTOSCOPY WITH RETROGRADE PYELOGRAM, BILATERAL URETEROSCOPY AND BILATERAL STENT PLACEMENT, LASER;  Surgeon: [name], MD;  Location: [facility];  Service: Urology;  Laterality: Bilateral;  90 MINS  . CYSTOSCOPY WITH RETROGRADE PYELOGRAM, URETEROSCOPY AND STENT PLACEMENT Bilateral 03/24/2018   Procedure: CYSTOSCOPY WITH RETROGRADE PYELOGRAM, URETEROSCOPY AND STENT PLACEMENT;  Surgeon: [name], MD;  Location: [facility];  Service: Urology;  Laterality: Bilateral;  1 HR  . CYSTOSCOPY WITH RETROGRADE PYELOGRAM, URETEROSCOPY AND STENT PLACEMENT Bilateral 07/07/2018   Procedure: CYSTOSCOPY WITH RETROGRADE PYELOGRAM, URETEROSCOPY AND STENT PLACEMENT;  Surgeon: [name], MD;  Location: [facility];  Service: Urology;  Laterality: Bilateral;  75 MINUTES  . CYSTOSCOPY WITH RETROGRADE PYELOGRAM, URETEROSCOPY AND STENT PLACEMENT Left 01/12/2019   Procedure: CYSTOSCOPY WITH RETROGRADE PYELOGRAM, URETEROSCOPY AND STENT PLACEMENT;  Surgeon: [name], MD;  Location: [facility];  Service: Urology;  Laterality: Left;  90 MINS  . CYSTOSCOPY WITH RETROGRADE PYELOGRAM, URETEROSCOPY AND STENT PLACEMENT Left 01/28/2019   Procedure: CYSTOSCOPY WITH RETROGRADE PYELOGRAM, URETEROSCOPY AND STENT EXCHANGE STONE BASKET EXTRACTION ;  Surgeon: [name], MD;  Location: [facility];  Service: Urology;  Laterality: Left;  . CYSTOSCOPY WITH RETROGRADE PYELOGRAM, URETEROSCOPY AND STENT PLACEMENT Bilateral 05/13/2019   Procedure: CYSTOSCOPY WITH BILATERAL  RETROGRADE PYELOGRAM, LEFT URETEROSCOPY  WITH HOLMIUM LASER AND STENT PLACEMENT;  Surgeon: Sebastian AcheManny, Theodore, MD;  Location: [facility];   Service: Urology;  Laterality: Bilateral;  . CYSTOSCOPY WITH RETROGRADE PYELOGRAM, URETEROSCOPY AND STENT PLACEMENT Bilateral 11/02/2019   Procedure: CYSTOSCOPY WITH RETROGRADE PYELOGRAM, URETEROSCOPY AND STENT PLACEMENT;  Surgeon: Sebastian AcheManny, Theodore, MD;  [facility];  Service: Urology;  Laterality: Bilateral;  90 MINS  . CYSTOSCOPY/RETROGRADE/URETEROSCOPY/STONE EXTRACTION WITH BASKET Left 10/05/2017   Procedure: CYSTOSCOPY/RETROGRADE/STONE REMOVAL FROM BLADDER ;  Surgeon: Bjorn PippinWrenn, John, MD;  Location: [facility];  Service: Urology;  Laterality: Left;  . CYSTOSCOPY/URETEROSCOPY/HOLMIUM LASER/STENT PLACEMENT Right 09/28/2017   Procedure: CYSTOSCOPY/RIGHT RETROGRADE URETEROSCOPY/HOLMIUM LASER/ RIGHT STENT PLACEMENT;  Surgeon: Rene PaciWinter, Christopher Aaron, MD;  Location: [facility];  Service: Urology;  Laterality: Right;  . CYSTOSCOPY/URETEROSCOPY/HOLMIUM LASER/STENT PLACEMENT Bilateral 01/27/2018   Procedure: CYSTOSCOPY/URETEROSCOPY/HOLMIUM LASER/STENT PLACEMENT;  Surgeon: Sebastian AcheManny, Theodore, MD;  [facility];  Service: Urology;  Laterality: Bilateral;  . HOLMIUM LASER APPLICATION Bilateral 06/26/2017   Procedure: HOLMIUM LASER APPLICATION;  Surgeon: Sebastian AcheManny, Theodore, MD;  [facility] [place];  Service: Urology;  Laterality: Bilateral;  . HOLMIUM LASER APPLICATION Right 09/18/2017   Procedure: HOLMIUM LASER APPLICATION;  Surgeon: Crist FatHerrick, Benjamin W, MD;  Location: [facility];  Service: Urology;  Laterality: Right;  . HOLMIUM LASER APPLICATION Bilateral 10/13/2017   Procedure: HOLMIUM LASER APPLICATION;  Surgeon: Sebastian AcheManny, Theodore, MD;  Location: [facility];  Service: Urology;  Laterality: Bilateral;  . HOLMIUM LASER APPLICATION Bilateral 01/27/2018   Procedure: HOLMIUM LASER APPLICATION;  Surgeon: Sebastian AcheManny, Theodore, MD;  [facility] ;  Service: Urology;  Laterality: Bilateral;  . HOLMIUM LASER APPLICATION Bilateral 03/24/2018   Procedure: HOLMIUM  LASER APPLICATION;  Surgeon: Sebastian AcheManny, Theodore, MD;  Location: [facility];  Service: Urology;  Laterality: Bilateral;  . HOLMIUM LASER APPLICATION Bilateral 07/07/2018   Procedure: HOLMIUM LASER APPLICATION;  Surgeon: Sebastian AcheManny, Theodore, MD;  Location: [facility];  Service: Urology;  Laterality: Bilateral;  . HOLMIUM LASER APPLICATION Left 01/12/2019   Procedure: HOLMIUM LASER APPLICATION;  Surgeon: Sebastian AcheManny, Theodore, MD;  Location: [facility];  Service: Urology;  Laterality: Left;  . HOLMIUM LASER APPLICATION Bilateral 11/02/2019   Procedure: HOLMIUM LASER APPLICATION;  Surgeon: Sebastian AcheManny, Theodore, MD;  [facility] ;  Service: Urology;  Laterality: Bilateral;  . PERCUTANEOUS NEPHROSTOLITHOTOMY  2003;  2009;  06-22-2014; 01-30-2015  [email or web address]  . URETEROSCOPIC STONE MANIPULATION UNILATERAL  multiple since 1997--  last one 02-19-2017  [facility]       History reviewed. No pertinent family history.  Social History   Tobacco Use  . Smoking status: Never Smoker  . Smokeless tobacco: Never Used  Vaping Use  . Vaping Use: Never used  Substance Use Topics  . Alcohol use: Never  . Drug use: Never    Home Medications Prior to Admission medications   Medication Sig Start Date End Date Taking? Authorizing Provider  oxyCODONE (ROXICODONE) 5 MG immediate release tablet Take 1 tablet (5 mg total) by mouth every 4 (four) hours as needed for severe pain. 04/19/20  Yes Alexandre Lightsey A, PA-C  tamsulosin (FLOMAX) 0.4 MG CAPS capsule Take 1 capsule (0.4 mg total) by mouth daily. 04/19/20  Yes Rayelynn Loyal A, PA-C    Allergies    Morphine and Other  Review of Systems   Review of Systems  Constitutional: Negative for appetite change, chills, diaphoresis and fever.  HENT: Negative for congestion and sore throat.   Eyes: Negative for visual disturbance.  Respiratory: Negative for shortness  of breath.   Cardiovascular: Negative for chest pain.  Gastrointestinal: Positive for abdominal pain, nausea and  vomiting. Negative for blood in stool, constipation and diarrhea.  Genitourinary: Positive for flank pain. Negative for dysuria, frequency, hematuria, penile pain, penile swelling, scrotal swelling and testicular pain.  Musculoskeletal: Negative for back pain, joint swelling, myalgias and neck stiffness.  Skin: Negative for rash and wound.  Allergic/Immunologic: Negative for immunocompromised state.  Neurological: Negative for dizziness, seizures, syncope, weakness, numbness and headaches.  Psychiatric/Behavioral: Negative for confusion.    Physical Exam Updated Vital Signs BP 119/75   Pulse 72   Temp 97.6 F (36.4 C) (Oral)   Resp 20   Ht  (1.753 m)   Wt 99.8 kg   SpO2 94%   BMI 32.49 kg/m   Physical Exam Vitals and nursing note reviewed.  Constitutional:      General: He is not in acute distress.    Appearance: Normal appearance. He is well-developed. He is not ill-appearing, toxic-appearing or diaphoretic.  HENT:     Head: Normocephalic.     Mouth/Throat:     Mouth: Mucous membranes are moist.     Pharynx: No oropharyngeal exudate or posterior oropharyngeal erythema.  Eyes:     Conjunctiva/sclera: Conjunctivae normal.  Cardiovascular:     Rate and Rhythm: Normal rate and regular rhythm.     Pulses: Normal pulses.     Heart sounds: Normal heart sounds. No murmur heard. No friction rub. No gallop.   Pulmonary:     Effort: Pulmonary effort is normal. No respiratory distress.     Breath sounds: No stridor. No wheezing, rhonchi or rales.  Chest:     Chest wall: No tenderness.  Abdominal:     General: There is no distension.     Palpations: Abdomen is soft.     Tenderness: There is abdominal tenderness.     Comments: Tender to palpation in the right lower quadrant and suprapubic regions with voluntary guarding.  No rebound.  Abdomen is soft and nondistended.  Normoactive bowel sounds.  No CVA tenderness bilaterally.  Musculoskeletal:        General: No tenderness.      Cervical back: Neck supple.     Right lower leg: No edema.     Left lower leg: No edema.  Skin:    General: Skin is warm and dry.     Capillary Refill: Capillary refill takes less than 2 seconds.  Neurological:     Mental Status: He is alert.  Psychiatric:        Behavior: Behavior normal.     ED Results / Procedures / Treatments   Labs (all labs ordered are listed, but only abnormal results are displayed) Labs Reviewed  BASIC METABOLIC PANEL - Abnormal; Notable for the following components:      Result Value   Glucose, Bld 122 (*)    BUN 23 (*)    All other components within normal limits  URINALYSIS, ROUTINE W REFLEX MICROSCOPIC - Abnormal; Notable for the following components:   Hgb urine dipstick SMALL (*)    Bilirubin Urine SMALL (*)    Protein, ur 30 (*)    All other components within normal limits    EKG None  Radiology CT Renal Stone Study  Result Date: 04/19/2020 CLINICAL DATA:  Right lower abdominal pain. EXAM: CT ABDOMEN AND PELVIS WITHOUT CONTRAST TECHNIQUE: Multidetector CT imaging of the abdomen and pelvis was performed following the standard protocol without IV contrast. COMPARISON:  January 09, 2020 FINDINGS: Lower chest: No acute abnormality. Hepatobiliary: No focal liver abnormality is seen. No gallstones, gallbladder wall thickening, or biliary dilatation. Pancreas: Unremarkable. No pancreatic ductal dilatation or surrounding inflammatory changes. Spleen: Normal in size without focal abnormality. Adrenals/Urinary Tract: Adrenal glands are unremarkable. Kidneys are normal in size. A 1.2 cm diameter cystic appearing area is seen within the lateral aspect of the mid right kidney. An additional 1.3 cm x 0.8 cm cystic appearing area is noted within the medial aspect of the mid left kidney. A cluster of 3 mm and 4 mm nonobstructing renal stones are seen within the mid to upper right kidney. Additional 3 mm and 4 mm nonobstructing renal stones are seen within the  mid left kidney. There is mild to moderate severity right-sided hydronephrosis and hydroureter with mild right-sided perinephric inflammatory fat stranding. A 4 mm calcification is seen within the dependent portion of the urinary bladder, along the midline. Stomach/Bowel: Stomach is within normal limits. Appendix appears normal. No evidence of bowel wall thickening, distention, or inflammatory changes. Vascular/Lymphatic: No significant vascular findings are present. No enlarged abdominal or pelvic lymph nodes. Reproductive: Prostate is unremarkable. Other: There are stable bilateral fat containing inguinal hernias. No abdominopelvic ascites. Musculoskeletal: No acute or significant osseous findings. IMPRESSION: 1. Findings consistent with recently passed right renal stone. 2. Bilateral nonobstructing renal stones. 3. Stable bilateral fat containing inguinal hernias. Electronically Signed   By: Aram Candela M.D.   On: 04/19/2020 02:23    Procedures Procedures   Medications Ordered in ED Medications  HYDROmorphone (DILAUDID) injection 0.5 mg (0.5 mg Intravenous Given 04/19/20 0113)  ondansetron (ZOFRAN) injection 4 mg (4 mg Intravenous Given 04/19/20 0135)  HYDROmorphone (DILAUDID) injection 0.5 mg (0.5 mg Intravenous Given 04/19/20 0131)  diphenhydrAMINE (BENADRYL) injection 12.5 mg (12.5 mg Intravenous Given 04/19/20 0226)  ketorolac (TORADOL) 30 MG/ML injection 30 mg (30 mg Intravenous Given 04/19/20 0310)  oxyCODONE-acetaminophen (PERCOCET/ROXICET) 5-325 MG per tablet 1 tablet (1 tablet Oral Given 04/19/20 0405)    ED Course  I have reviewed the triage vital signs and the nursing notes.  Pertinent labs & imaging results that were available during my care of the patient were reviewed by me and considered in my medical decision making (see chart for details).    MDM Rules/Calculators/A&P                          40 year old male with history of cystinuria, recurrent kidney stones followed by  urology who presents the emergency department with right flank and abdominal pain accompanied by nausea and vomiting.  Patient states that symptoms feel similar to previous kidney stones that he has passed.  He in fact passed a stone just prior to arrival in the ED.  Initially hypertensive in the ER.  Vital signs are otherwise unremarkable.  Labs and imaging of been reviewed and independently interpreted by me. UA with hemoglobinuria.  No concern for infection.  Creatinine is normal.  No metabolic derangements.  Recently passed right renal stone.  CT with recently passed right renal stone there are also bilateral nonobstructing renal stones.  No other acute findings.  Doubt urosepsis, appendicitis, diverticulitis, bowel obstruction, mesenteric ischemia, cholecystitis, pancreatitis.  Patient was given Dilaudid and Toradol.  On reevaluation, pain was significantly improved.  He was successfully fluid challenge.  Given multiple other renal stones, will discharge home with Flomax, pain medication.  He was encouraged to follow-up with urology.  All questions answered.  He is hemodynamically stable in no acute distress.  Safe for discharge home with outpatient follow-up as indicated.   Final Clinical Impression(s) / ED Diagnoses Final diagnoses:  Ureterolithiasis  Non-intractable vomiting with nausea, unspecified vomiting type    Rx / DC Orders ED Discharge Orders         Ordered    tamsulosin (FLOMAX) 0.4 MG CAPS capsule  Daily        04/19/20 0402    oxyCODONE (ROXICODONE) 5 MG immediate release tablet  Every 4 hours PRN        04/19/20 0402           Gavina Dildine A, PA-C 04/19/20 4132    Nira Conn, MD 04/20/20 0136

## 2020-08-08 ENCOUNTER — Other Ambulatory Visit: Payer: Self-pay | Admitting: Urology

## 2020-08-08 ENCOUNTER — Observation Stay (HOSPITAL_COMMUNITY): Payer: PRIVATE HEALTH INSURANCE

## 2020-08-08 ENCOUNTER — Encounter (HOSPITAL_COMMUNITY)
Admission: EM | Disposition: A | Discharge status: Home / Self Care | Payer: Self-pay | Source: Home / Self Care | Attending: Emergency Medicine

## 2020-08-08 ENCOUNTER — Inpatient Hospital Stay: Admit: 2020-08-08 | Payer: No Typology Code available for payment source | Admitting: Urology

## 2020-08-08 ENCOUNTER — Observation Stay (HOSPITAL_COMMUNITY)
Admission: EM | Admit: 2020-08-08 | Discharge: 2020-08-08 | Disposition: A | Discharge status: Home / Self Care | Payer: PRIVATE HEALTH INSURANCE | Attending: Urology | Admitting: Urology

## 2020-08-08 ENCOUNTER — Encounter (HOSPITAL_COMMUNITY): Payer: Self-pay

## 2020-08-08 ENCOUNTER — Observation Stay (HOSPITAL_COMMUNITY): Payer: PRIVATE HEALTH INSURANCE | Admitting: Certified Registered Nurse Anesthetist

## 2020-08-08 ENCOUNTER — Emergency Department (HOSPITAL_COMMUNITY): Payer: PRIVATE HEALTH INSURANCE

## 2020-08-08 ENCOUNTER — Other Ambulatory Visit: Payer: Self-pay

## 2020-08-08 DIAGNOSIS — N132 Hydronephrosis with renal and ureteral calculous obstruction: Secondary | ICD-10-CM | POA: Diagnosis not present

## 2020-08-08 DIAGNOSIS — Z8616 Personal history of COVID-19: Secondary | ICD-10-CM | POA: Insufficient documentation

## 2020-08-08 DIAGNOSIS — Z20822 Contact with and (suspected) exposure to covid-19: Secondary | ICD-10-CM | POA: Insufficient documentation

## 2020-08-08 DIAGNOSIS — N200 Calculus of kidney: Secondary | ICD-10-CM

## 2020-08-08 HISTORY — PX: CYSTOSCOPY/URETEROSCOPY/HOLMIUM LASER/STENT PLACEMENT: SHX6546

## 2020-08-08 LAB — COMPREHENSIVE METABOLIC PANEL
ALT: 29 U/L (ref 0–44)
AST: 41 U/L (ref 15–41)
Albumin: 4.3 g/dL (ref 3.5–5.0)
Alkaline Phosphatase: 62 U/L (ref 38–126)
Anion gap: 8 (ref 5–15)
BUN: 20 mg/dL (ref 6–20)
CO2: 24 mmol/L (ref 22–32)
Calcium: 9.3 mg/dL (ref 8.9–10.3)
Chloride: 105 mmol/L (ref 98–111)
Creatinine, Ser: 1.08 mg/dL (ref 0.61–1.24)
GFR, Estimated: >60 mL/min (ref >60)
Glucose, Bld: 99 mg/dL (ref 70–99)
Potassium: 5.1 mmol/L (ref 3.5–5.1)
Sodium: 137 mmol/L (ref 135–145)
Total Bilirubin: 1.5 mg/dL — ABNORMAL HIGH (ref 0.3–1.2)
Total Protein: 7.3 g/dL (ref 6.5–8.1)

## 2020-08-08 LAB — URINALYSIS, ROUTINE W REFLEX MICROSCOPIC
Bacteria, UA: NONE SEEN
Bilirubin Urine: NEGATIVE
Glucose, UA: NEGATIVE mg/dL
Ketones, ur: NEGATIVE mg/dL
Leukocytes,Ua: NEGATIVE
Nitrite: NEGATIVE
Protein, ur: 100 mg/dL — AB
RBC / HPF: >50 RBC/hpf — ABNORMAL HIGH (ref 0–5)
Specific Gravity, Urine: 1.02 (ref 1.005–1.030)
pH: 7 (ref 5.0–8.0)

## 2020-08-08 LAB — CBC WITH DIFFERENTIAL/PLATELET
Abs Immature Granulocytes: 0.01 10*3/uL (ref 0.00–0.07)
Basophils Absolute: 0 10*3/uL (ref 0.0–0.1)
Basophils Relative: 0 %
Eosinophils Absolute: 0.1 10*3/uL (ref 0.0–0.5)
Eosinophils Relative: 1 %
HCT: 43.1 % (ref 39.0–52.0)
Hemoglobin: 14.8 g/dL (ref 13.0–17.0)
Immature Granulocytes: 0 %
Lymphocytes Relative: 20 %
Lymphs Abs: 1.2 10*3/uL (ref 0.7–4.0)
MCH: 33.6 pg (ref 26.0–34.0)
MCHC: 34.3 g/dL (ref 30.0–36.0)
MCV: 98 fL (ref 80.0–100.0)
Monocytes Absolute: 0.6 10*3/uL (ref 0.1–1.0)
Monocytes Relative: 11 %
Neutro Abs: 3.9 10*3/uL (ref 1.7–7.7)
Neutrophils Relative %: 68 %
Platelets: 224 10*3/uL (ref 150–400)
RBC: 4.4 MIL/uL (ref 4.22–5.81)
RDW: 11.7 % (ref 11.5–15.5)
WBC: 5.8 10*3/uL (ref 4.0–10.5)
nRBC: 0 % (ref 0.0–0.2)

## 2020-08-08 LAB — RESP PANEL BY RT-PCR (FLU A&B, COVID) ARPGX2
Influenza A by PCR: NEGATIVE
Influenza B by PCR: NEGATIVE
SARS Coronavirus 2 by RT PCR: NEGATIVE

## 2020-08-08 SURGERY — CYSTOSCOPY/URETEROSCOPY/HOLMIUM LASER/STENT PLACEMENT
Anesthesia: General | Site: Ureter | Laterality: Right

## 2020-08-08 MED ORDER — IOHEXOL 300 MG/ML  SOLN
INTRAMUSCULAR | Status: DC | PRN
  Administered 2020-08-08: 10 mL

## 2020-08-08 MED ORDER — MIDAZOLAM HCL 2 MG/2ML IJ SOLN
INTRAMUSCULAR | Status: AC
  Filled 2020-08-08: qty 2

## 2020-08-08 MED ORDER — ACETAMINOPHEN 160 MG/5ML PO SOLN: 1000.0000 mg | Freq: Once | ORAL | Status: AC | PRN

## 2020-08-08 MED ORDER — ACETAMINOPHEN 500 MG PO TABS
ORAL_TABLET | ORAL | Status: AC
  Administered 2020-08-08: 1000 mg via ORAL
  Filled 2020-08-08: qty 2

## 2020-08-08 MED ORDER — SODIUM CHLORIDE 0.9 % IR SOLN
Status: DC | PRN
  Administered 2020-08-08: 3000 mL

## 2020-08-08 MED ORDER — OXYCODONE HCL 5 MG PO TABS
ORAL_TABLET | ORAL | Status: AC
  Administered 2020-08-08: 5 mg via ORAL
  Filled 2020-08-08: qty 1

## 2020-08-08 MED ORDER — CEFAZOLIN SODIUM-DEXTROSE 2-4 GM/100ML-% IV SOLN
INTRAVENOUS | Status: AC
  Filled 2020-08-08: qty 100

## 2020-08-08 MED ORDER — TRAMADOL HCL 50 MG PO TABS: 50.0000 mg | ORAL_TABLET | Freq: Four times a day (QID) | ORAL | 0 refills | Status: DC | PRN

## 2020-08-08 MED ORDER — SODIUM CHLORIDE 0.9 % IV BOLUS
1000.0000 mL | Freq: Once | INTRAVENOUS | Status: AC
  Administered 2020-08-08: 1000 mL via INTRAVENOUS

## 2020-08-08 MED ORDER — HYDROMORPHONE HCL 1 MG/ML IJ SOLN
1.0000 mg | Freq: Once | INTRAMUSCULAR | Status: AC
Start: 2020-08-08 — End: 2020-08-08
  Administered 2020-08-08: 1 mg via INTRAVENOUS
  Filled 2020-08-08: qty 1

## 2020-08-08 MED ORDER — DEXAMETHASONE SODIUM PHOSPHATE 10 MG/ML IJ SOLN
INTRAMUSCULAR | Status: DC | PRN
  Administered 2020-08-08: 5 mg via INTRAVENOUS

## 2020-08-08 MED ORDER — HYDROMORPHONE HCL 1 MG/ML IJ SOLN
1.0000 mg | Freq: Once | INTRAMUSCULAR | Status: AC
  Administered 2020-08-08: 1 mg via INTRAVENOUS
  Filled 2020-08-08: qty 1

## 2020-08-08 MED ORDER — PROPOFOL 10 MG/ML IV BOLUS
INTRAVENOUS | Status: DC | PRN
  Administered 2020-08-08: 200 mg via INTRAVENOUS

## 2020-08-08 MED ORDER — CEFAZOLIN SODIUM-DEXTROSE 2-3 GM-%(50ML) IV SOLR
INTRAVENOUS | Status: DC | PRN
  Administered 2020-08-08: 2 g via INTRAVENOUS

## 2020-08-08 MED ORDER — ACETAMINOPHEN 500 MG PO TABS: 1000.0000 mg | ORAL_TABLET | Freq: Once | ORAL | Status: AC | PRN

## 2020-08-08 MED ORDER — LIDOCAINE HCL (CARDIAC) PF 100 MG/5ML IV SOSY
PREFILLED_SYRINGE | INTRAVENOUS | Status: DC | PRN
  Administered 2020-08-08: 100 mg via INTRAVENOUS

## 2020-08-08 MED ORDER — KETOROLAC TROMETHAMINE 15 MG/ML IJ SOLN
15.0000 mg | Freq: Once | INTRAMUSCULAR | Status: AC
  Administered 2020-08-08: 15 mg via INTRAVENOUS
  Filled 2020-08-08: qty 1

## 2020-08-08 MED ORDER — PROPOFOL 10 MG/ML IV BOLUS
INTRAVENOUS | Status: AC
  Filled 2020-08-08: qty 20

## 2020-08-08 MED ORDER — FENTANYL CITRATE (PF) 100 MCG/2ML IJ SOLN: 25.0000 ug | INTRAMUSCULAR | Status: DC | PRN

## 2020-08-08 MED ORDER — ONDANSETRON HCL 4 MG/2ML IJ SOLN
INTRAMUSCULAR | Status: DC | PRN
  Administered 2020-08-08: 4 mg via INTRAVENOUS

## 2020-08-08 MED ORDER — LACTATED RINGERS IV SOLN: INTRAVENOUS | Status: DC

## 2020-08-08 MED ORDER — OXYBUTYNIN CHLORIDE 5 MG PO TABS: 5.0000 mg | ORAL_TABLET | Freq: Three times a day (TID) | ORAL | 1 refills | Status: DC | PRN

## 2020-08-08 MED ORDER — ACETAMINOPHEN 10 MG/ML IV SOLN: 1000.0000 mg | Freq: Once | INTRAVENOUS | Status: DC | PRN

## 2020-08-08 MED ORDER — FENTANYL CITRATE (PF) 100 MCG/2ML IJ SOLN
INTRAMUSCULAR | Status: AC
  Filled 2020-08-08: qty 2

## 2020-08-08 MED ORDER — ONDANSETRON HCL 4 MG/2ML IJ SOLN
4.0000 mg | Freq: Once | INTRAMUSCULAR | Status: AC
  Administered 2020-08-08: 4 mg via INTRAVENOUS
  Filled 2020-08-08: qty 2

## 2020-08-08 MED ORDER — OXYCODONE HCL 5 MG/5ML PO SOLN: 5.0000 mg | Freq: Once | ORAL | Status: AC | PRN

## 2020-08-08 MED ORDER — FENTANYL CITRATE (PF) 100 MCG/2ML IJ SOLN
INTRAMUSCULAR | Status: DC | PRN
  Administered 2020-08-08 (×3): 50 ug via INTRAVENOUS

## 2020-08-08 MED ORDER — CHLORHEXIDINE GLUCONATE 0.12 % MT SOLN
15.0000 mL | Freq: Once | OROMUCOSAL | Status: AC
  Administered 2020-08-08: 15 mL via OROMUCOSAL

## 2020-08-08 MED ORDER — LIDOCAINE 2% (20 MG/ML) 5 ML SYRINGE
INTRAMUSCULAR | Status: AC
  Filled 2020-08-08: qty 5

## 2020-08-08 MED ORDER — MIDAZOLAM HCL 2 MG/2ML IJ SOLN
INTRAMUSCULAR | Status: DC | PRN
  Administered 2020-08-08: 2 mg via INTRAVENOUS

## 2020-08-08 MED ORDER — DIPHENHYDRAMINE HCL 50 MG/ML IJ SOLN
12.5000 mg | Freq: Once | INTRAMUSCULAR | Status: AC
  Administered 2020-08-08: 12.5 mg via INTRAVENOUS
  Filled 2020-08-08: qty 1

## 2020-08-08 MED ORDER — OXYCODONE HCL 5 MG PO TABS: 5.0000 mg | ORAL_TABLET | Freq: Once | ORAL | Status: AC | PRN

## 2020-08-08 SURGICAL SUPPLY — 21 items
BAG URO CATCHER STRL LF (MISCELLANEOUS) ×4 IMPLANT
CATH INTERMIT  6FR 70CM (CATHETERS) IMPLANT
EXTRACTOR STONE NITINOL NGAGE (UROLOGICAL SUPPLIES) IMPLANT
GLOVE SURG ENC TEXT LTX SZ8 (GLOVE) ×4 IMPLANT
GOWN STRL REUS W/TWL XL LVL3 (GOWN DISPOSABLE) ×4 IMPLANT
GUIDEWIRE ANG ZIPWIRE 038X150 (WIRE) IMPLANT
GUIDEWIRE STR DUAL SENSOR (WIRE) ×6 IMPLANT
GUIDEWIRE SUPER STIFF (WIRE) ×2 IMPLANT
IV NS IRRIG 3000ML ARTHROMATIC (IV SOLUTION) ×2 IMPLANT
KIT TURNOVER KIT A (KITS) ×4 IMPLANT
LASER FIB FLEXIVA PULSE ID 365 (Laser) ×2 IMPLANT
MANIFOLD NEPTUNE II (INSTRUMENTS) ×4 IMPLANT
PACK CYSTO (CUSTOM PROCEDURE TRAY) ×4 IMPLANT
SHEATH URETERAL 12FRX28CM (UROLOGICAL SUPPLIES) IMPLANT
SHEATH URETERAL 12FRX35CM (MISCELLANEOUS) ×2 IMPLANT
SHEATH URETERAL 12FRX55CM (UROLOGICAL SUPPLIES) IMPLANT
STENT URET 6FRX26 CONTOUR (STENTS) ×2 IMPLANT
TRACTIP FLEXIVA PULS ID 200XHI (Laser) IMPLANT
TRACTIP FLEXIVA PULSE ID 200 (Laser)
TUBING CONNECTING 10 (TUBING) ×4 IMPLANT
TUBING UROLOGY SET (TUBING) ×4 IMPLANT

## 2020-08-08 NOTE — Anesthesia Postprocedure Evaluation (Signed)
Anesthesia Post Note  Patient: NISHANTH MCCAUGHAN  Procedure(s) Performed: CYSTOSCOPY/URETEROSCOPY/RETROGRADE/ HOLMIUM LASER/STENT PLACEMENT (Right: Ureter)     Patient location during evaluation: PACU Anesthesia Type: General Level of consciousness: awake and alert Pain management: pain level controlled Vital Signs Assessment: post-procedure vital signs reviewed and stable Respiratory status: spontaneous breathing, nonlabored ventilation, respiratory function stable and patient connected to nasal cannula oxygen Cardiovascular status: blood pressure returned to baseline and stable Postop Assessment: no apparent nausea or vomiting Anesthetic complications: no   No notable events documented.  Last Vitals:  Vitals:   08/08/20 1745 08/08/20 1800  BP: (!) 121/97 (!) 124/93  Pulse: 77 93  Resp: (!) 22 13  Temp:  36.6 C  SpO2: 95% 94%    Last Pain:  Vitals:   08/08/20 1800  TempSrc:   PainSc: 0-No pain                 Jerran Tappan

## 2020-08-08 NOTE — ED Triage Notes (Signed)
Pt reports passing kidney stone fragments for the past 3 days. Pt reports severe pain to right flank and RLQ. Minimal pain to LLQ and left flank. Pt has hx of kidney stones.

## 2020-08-08 NOTE — ED Provider Notes (Signed)
Care of the patient was assumed from S. Petrucelli PA-C at 630; see this physician's note for complete history of present illness, review of systems, and physical exam.  Briefly, the patient is a 40 y.o. male who presented to the ED with abdominal pain.  History significant for kidney stones with prior stent placement bilaterally.  Work-up today significant for large mid ureter stone.  Plan at time of handoff:  -Reevaluate after pain medication.  If pain is controlled patient is able to be discharged according to urology, if pain is not controlled to be admitted to urology.  Physical Exam  BP 126/85   Pulse 79   Temp 97.9 F (36.6 C) (Oral)   Resp 20   SpO2 96%   Physical Exam Constitutional:      General: He is in acute distress.     Appearance: Normal appearance. He is not ill-appearing, toxic-appearing or diaphoretic.  Cardiovascular:     Rate and Rhythm: Normal rate.     Pulses: Normal pulses.  Pulmonary:     Effort: Pulmonary effort is normal.  Musculoskeletal:        General: Normal range of motion.  Skin:    General: Skin is warm and dry.     Capillary Refill: Capillary refill takes less than 2 seconds.  Neurological:     General: No focal deficit present.     Mental Status: He is alert and oriented to person, place, and time.  Psychiatric:        Mood and Affect: Mood normal.        Behavior: Behavior normal.        Thought Content: Thought content normal.    ED Course/Procedures     Procedures  MDM  Patient with large mid ureteral stone, pain unable to be controlled here in the emergency department.  Patient to be admitted to urology, Dr. Retta Diones.   The patient appears reasonably stabilized for admission considering the current resources, flow, and capabilities available in the ED at this time, and I doubt any other [facility] requiring further screening and/or treatment in the ED prior to admission.     Farrel Gordon, PA-C 08/08/20 5997    Jacalyn Lefevre,  MD 08/08/20 0900

## 2020-08-08 NOTE — ED Provider Notes (Signed)
[facility]-EMERGENCY DEPT Provider Note   CSN: [id number] Arrival date & time: 08/08/20  0207     History Chief Complaint  Patient presents with   Abdominal Pain    Devin Sanders is a 40 y.o. male with a hx of kidney stones with prior stent placement bilaterally and GERD who presents to the ED with complaints of abdominal pain over the past few days that acutely worsened tonight.  Patient states pain is located in the right mid to lower quadrant, waxing/waning in severity with associated nausea and vomiting.  He has passed a few fragments of kidney stones over the past few days, however his pain worsened tonight and he was unable to control it at home.  He has had some mild left-sided pain as well, but the right is much more prominent.  This feels like prior kidney stones when he has needed stent intervention.  He denies fever, chills, hematemesis, dysuria, hematuria, testicular pain/swelling, or diarrhea.  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)   GERD (gastroesophageal reflux disease)    pt denied   History of kidney stones    Left ureteral calculus    Renal calculus, left    Wears glasses     Patient Active Problem List   Diagnosis Date Noted   Hydronephrosis of right kidney 09/28/2017    Past Surgical History:  Procedure Laterality Date   CYSTOSCOPY WITH RETROGRADE PYELOGRAM, URETEROSCOPY AND STENT PLACEMENT Bilateral 06/26/2017   Procedure: CYSTOSCOPY WITH RETROGRADE PYELOGRAM, URETEROSCOPY, STONE BASKETRY  AND STENT PLACEMENT;  Surgeon: [name], MD;  Location: [facility];  Service: Urology;  Laterality: Bilateral;   CYSTOSCOPY WITH RETROGRADE PYELOGRAM, URETEROSCOPY AND STENT PLACEMENT Right 09/18/2017   Procedure: CYSTOSCOPY WITH RETROGRADE PYELOGRAM, URETEROSCOPY AND STENT PLACEMENT;  Surgeon: Crist Fat, MD;  Location: [facility];  Service: Urology;  Laterality: Right;   CYSTOSCOPY WITH RETROGRADE PYELOGRAM, URETEROSCOPY AND STENT PLACEMENT Bilateral 10/13/2017   Procedure: CYSTOSCOPY WITH RETROGRADE PYELOGRAM, BILATERAL URETEROSCOPY AND BILATERAL STENT PLACEMENT, LASER;  Surgeon: [name], MD;  Location: [facility];  Service: Urology;  Laterality: Bilateral;  90 MINS   CYSTOSCOPY WITH RETROGRADE PYELOGRAM, URETEROSCOPY AND STENT PLACEMENT Bilateral 03/24/2018   Procedure: CYSTOSCOPY WITH RETROGRADE PYELOGRAM, URETEROSCOPY AND STENT PLACEMENT;  Surgeon: [name], MD;  Location: [facility];  Service: Urology;  Laterality: Bilateral;  1 HR   CYSTOSCOPY WITH RETROGRADE PYELOGRAM, URETEROSCOPY AND STENT PLACEMENT Bilateral 07/07/2018   Procedure: CYSTOSCOPY WITH RETROGRADE PYELOGRAM, URETEROSCOPY AND STENT PLACEMENT;  Surgeon: [name], MD;  Location: [facility];  Service: Urology;  Laterality: Bilateral;  75 MINUTES   CYSTOSCOPY WITH RETROGRADE PYELOGRAM, URETEROSCOPY AND STENT PLACEMENT Left 01/12/2019   Procedure: CYSTOSCOPY WITH RETROGRADE PYELOGRAM, URETEROSCOPY AND STENT PLACEMENT;  Surgeon: [name], MD;  Location: [facility];  Service: Urology;  Laterality: Left;  90 MINS   CYSTOSCOPY WITH RETROGRADE PYELOGRAM, URETEROSCOPY AND STENT PLACEMENT Left 01/28/2019   Procedure: CYSTOSCOPY WITH RETROGRADE PYELOGRAM, URETEROSCOPY AND STENT EXCHANGE STONE BASKET EXTRACTION ;  Surgeon: [name], MD;  Location: [facility];  Service: Urology;  Laterality: Left;   CYSTOSCOPY WITH RETROGRADE PYELOGRAM, URETEROSCOPY AND STENT PLACEMENT Bilateral 05/13/2019   Procedure: CYSTOSCOPY WITH BILATERAL  RETROGRADE PYELOGRAM, LEFT URETEROSCOPY WITH HOLMIUM LASER AND STENT PLACEMENT;  Surgeon: [name], MD;  Location: [facility];  Service: Urology;  Laterality: Bilateral;   CYSTOSCOPY WITH RETROGRADE PYELOGRAM, URETEROSCOPY AND STENT PLACEMENT Bilateral 11/02/2019   Procedure: CYSTOSCOPY WITH  RETROGRADE PYELOGRAM, URETEROSCOPY AND STENT PLACEMENT;  Surgeon: [name], MD;  Location[facility];  Service: Urology;  Laterality: Bilateral;  90 MINS   CYSTOSCOPY/RETROGRADE/URETEROSCOPY/STONE EXTRACTION WITH BASKET Left 10/05/2017   Procedure: CYSTOSCOPY/RETROGRADE/STONE REMOVAL FROM BLADDER ;  Surgeon: Bjorn Pippin, MD;  Location: [facility];  Service: Urology;  Laterality: Left;   CYSTOSCOPY/URETEROSCOPY/HOLMIUM LASER/STENT PLACEMENT Right 09/28/2017   Procedure: CYSTOSCOPY/RIGHT RETROGRADE URETEROSCOPY/HOLMIUM LASER/ RIGHT STENT PLACEMENT;  Surgeon: Rene Paci, MD;  Location: [facility];  Service: Urology;  Laterality: Right;   CYSTOSCOPY/URETEROSCOPY/HOLMIUM LASER/STENT PLACEMENT Bilateral 01/27/2018   Procedure: CYSTOSCOPY/URETEROSCOPY/HOLMIUM LASER/STENT PLACEMENT;  Surgeon: [name], MD;  Location: [facility];  Service: Urology;  Laterality: Bilateral;   HOLMIUM LASER APPLICATION Bilateral 06/26/2017   Procedure: HOLMIUM LASER APPLICATION;  Surgeon: [name], MD;  Location: [facility];  Service: Urology;  Laterality: Bilateral;   HOLMIUM LASER APPLICATION Right 09/18/2017   Procedure: HOLMIUM LASER APPLICATION;  Surgeon: Crist Fat, MD;  Location: [facility];  Service: Urology;  Laterality: Right;   HOLMIUM LASER APPLICATION Bilateral 10/13/2017   Procedure: HOLMIUM LASER APPLICATION;  Surgeon: [name], MD;  Location: [facility];  Service: Urology;  Laterality: Bilateral;   HOLMIUM LASER APPLICATION Bilateral 01/27/2018   Procedure: HOLMIUM LASER APPLICATION;  Surgeon: [name], MD;  Location: [facility];  Service: Urology;  Laterality: Bilateral;   HOLMIUM LASER APPLICATION Bilateral 03/24/2018   Procedure: HOLMIUM LASER APPLICATION;  Surgeon: [name], MD;  Location: [facility];  Service: Urology;  Laterality: Bilateral;   HOLMIUM LASER APPLICATION Bilateral 07/07/2018   Procedure:  HOLMIUM LASER APPLICATION;  Surgeon: [name], MD;  Location: [facility];  Service: Urology;  Laterality: Bilateral;   HOLMIUM LASER APPLICATION Left 01/12/2019   Procedure: HOLMIUM LASER APPLICATION;  Surgeon: [name], MD;  Location: [facility];  Service: Urology;  Laterality: Left;   HOLMIUM LASER APPLICATION Bilateral 11/02/2019   Procedure: HOLMIUM LASER APPLICATION;  Surgeon: [name], MD;  Location: [facility];  Service: Urology;  Laterality: Bilateral;   PERCUTANEOUS NEPHROSTOLITHOTOMY  2003;  2009;  06-22-2014; 01-30-2015  @ [facility]   URETEROSCOPIC STONE MANIPULATION UNILATERAL  multiple since 1997--  last one 02-19-2017  @ [facility]       History reviewed. No pertinent family history.  Social History   Tobacco Use   Smoking status: Never   Smokeless tobacco: Never  Vaping Use   Vaping Use: Never used  Substance Use Topics   Alcohol use: Never   Drug use: Never    Home Medications Prior to Admission medications   Medication Sig Start Date End Date Taking? Authorizing Provider  oxyCODONE (ROXICODONE) 5 MG immediate release tablet Take 1 tablet (5 mg total) by mouth every 4 (four) hours as needed for severe pain. 04/19/20   McDonald, Mia A, PA-C  tamsulosin (FLOMAX) 0.4 MG CAPS capsule Take 1 capsule (0.4 mg total) by mouth daily. 04/19/20   McDonald, Mia A, PA-C    Allergies    Morphine and Other  Review of Systems   Review of Systems  Constitutional:  Negative for chills and fever.  Respiratory:  Negative for shortness of breath.   Cardiovascular:  Negative for chest pain.  Gastrointestinal:  Positive for abdominal pain, nausea and vomiting. Negative for constipation and diarrhea.  Genitourinary:  Negative for dysuria, scrotal swelling and testicular pain.  Neurological:  Negative for syncope.  All other systems reviewed and are negative.  Physical Exam Updated Vital Signs BP (!) 155/88 (BP Location: Left Arm)   Pulse (!) 112   Temp 97.9 F  (36.6 C) (Oral)   Resp (!) 21   SpO2 100%   Physical Exam Vitals and nursing note reviewed.  Constitutional:      General: He is not in acute distress.    Appearance: He is well-developed. He is not toxic-appearing.  HENT:     Head: Normocephalic and atraumatic.  Eyes:     General:        Right eye: No discharge.        Left eye: No discharge.     Conjunctiva/sclera: Conjunctivae normal.  Cardiovascular:     Rate and Rhythm: Regular rhythm. Tachycardia present.  Pulmonary:     Effort: Pulmonary effort is normal. No respiratory distress.     Breath sounds: Normal breath sounds. No wheezing, rhonchi or rales.  Abdominal:     General: There is no distension.     Palpations: Abdomen is soft.     Tenderness: There is abdominal tenderness (RLQ). There is right CVA tenderness. There is no left CVA tenderness, guarding or rebound.  Musculoskeletal:     Cervical back: Neck supple.  Skin:    General: Skin is warm and dry.     Findings: No rash.  Neurological:     Mental Status: He is alert.     Comments: Clear speech.   Psychiatric:        Behavior: Behavior normal.    ED Results / Procedures / Treatments   Labs (all labs ordered are listed, but only abnormal results are displayed) Labs Reviewed  URINALYSIS, ROUTINE W REFLEX MICROSCOPIC - Abnormal; Notable for the following components:      Result Value   APPearance HAZY (*)    Hgb urine dipstick MODERATE (*)    Protein, ur 100 (*)    RBC / HPF >50 (*)    All other components within normal limits  URINE CULTURE    EKG None  Radiology CT Renal Stone Study  Result Date: 08/08/2020 CLINICAL DATA:  Flank pain with kidney stone suspected EXAM: CT ABDOMEN AND PELVIS WITHOUT CONTRAST TECHNIQUE: Multidetector CT imaging of the abdomen and pelvis was performed following the standard protocol without IV contrast. COMPARISON:  04/19/2020 FINDINGS: Lower chest:  No contributory findings. Hepatobiliary: No focal liver abnormality.No  evidence of biliary obstruction or stone. Pancreas: Unremarkable. Spleen: Unremarkable. Adrenals/Urinary Tract: Negative adrenals. Right hydroureteronephrosis due to a mid ureteral calculus measuring 6 x 13 mm, just above the iliac crossing. No left hydronephrosis. At least 6 left renal calculi measuring up to 3 mm in the interpolar region. Left renal scarring with changes of prior percutaneous access. Three right renal calculi measuring up to 14 mm at the lower pole. Negative bladder. Stomach/Bowel:  No obstruction. No visible bowel inflammation. Vascular/Lymphatic: No acute vascular abnormality. No mass or adenopathy. Reproductive:No pathologic findings. Other: No ascites or pneumoperitoneum. Fatty enlargement of the bilateral inguinal canal. Musculoskeletal: No acute abnormalities. IMPRESSION: 1. Obstructing 13 x 6 mm right ureteral stone at the level of the iliac crossing. 2. Bilateral nephrolithiasis and left renal cortical scarring. Electronically Signed   By: Marnee Spring M.D.   On: 08/08/2020 06:31    Procedures Procedures   Medications Ordered in ED Medications - No data to display  ED Course  I have reviewed the triage vital signs and  the nursing notes.  Pertinent labs & imaging results that were available during my care of the patient were reviewed by me and considered in my medical decision making (see chart for details).    MDM Rules/Calculators/A&P                           Patient presents to the ED with complaints of abdominal pain.  Patient is nontoxic, mildly tachycardic and hypertensive on arrival.  Right CVA tenderness and right lower quadrant abdominal tenderness without peritoneal signs.  DDx: Nephrolithiasis, pyelonephritis, appendicitis, perforation, obstruction, viral GI illness, MSK pain.  Analgesics, antiemetics, and fluids ordered.  Will obtain basic labs.  We discussed risk/benefit of imaging given feel that nephrolithiasis is most likely and patient with several  prior kidney stones, patient would prefer to proceed with CT for further assessment.   Additional history obtained:  Additional history obtained from chart review & nursing note review.   Lab Tests:  I Ordered, reviewed, and interpreted labs, which included:  Urinalysis: Hematuria without UTI. Urine culture was ordered by nursing staff prior to my assessment.  CBC: Unremarkable.  CMP: Mild total bilirubin elevation, otherwise unremarkable.  Renal function preserved.  Imaging Studies ordered:  I ordered imaging studies which included CT renal stone study, I independently reviewed, formal radiology impression shows:  1. Obstructing 13 x 6 mm right ureteral stone at the level of the iliac crossing. 2. Bilateral nephrolithiasis and left renal cortical scarring  ED Course:  06:30: RE-EVAL: Patient with minimal pain relief.  Will redose with Dilaudid and discuss with urology given size of ureteral stone.  06:42: CONSULT: Discussed with urologist Dr. Alvester MorinBell, recommends continued pain management in the emergency department, relays okay to administer Toradol, if able to pain control can discharge home with close outpatient follow-up-have patient call the office today to make an appointment if unable to control pain call back.  06:55: Patient care signed out to [facility] LL[facility]alyn Patel PA-C at change of shift pending re-assessment & disposition.   Portions of this note were generated with Scientist, clinical (histocompatibility and immunogenetics)Dragon dictation software. Dictation errors may occur despite best attempts at proofreading.  Final Clinical Impression(s) / ED Diagnoses Final diagnoses:  Kidney stone    Rx / DC Orders ED Discharge Orders     None        Cherly Andersonetrucelli, Roan Sawchuk R, PA-C 08/08/20 [phone]    Shon BatonHorton, Courtney F, MD 08/09/20 [phone]

## 2020-08-08 NOTE — H&P (Addendum)
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  This was hard to control in the emergency room.  He has a large right mid/distal ureteral stone with proximal hydroureteronephrosis.  He also has a larger lower pole stone on the right side.  He presents at this time for cystoscopy, retrograde, ureteroscopy and laser management of his ureteral stone as well as stent placement.  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)   GERD (gastroesophageal reflux disease)    pt denied   History of kidney stones    Left ureteral calculus    Renal calculus, left    Wears glasses     Past Surgical History:  Procedure Laterality Date   CYSTOSCOPY WITH RETROGRADE PYELOGRAM, URETEROSCOPY AND STENT PLACEMENT Bilateral 06/26/2017   Procedure: CYSTOSCOPY WITH RETROGRADE PYELOGRAM, URETEROSCOPY, STONE BASKETRY  AND STENT PLACEMENT;  Surgeon: [name], MD;  Location: [facility];  Service: Urology;  Laterality: Bilateral;   CYSTOSCOPY WITH RETROGRADE PYELOGRAM, URETEROSCOPY AND STENT PLACEMENT Right 09/18/2017   Procedure: CYSTOSCOPY WITH RETROGRADE PYELOGRAM, URETEROSCOPY AND STENT PLACEMENT;  Surgeon: Crist Fat, MD;  Location: [facility];  Service: Urology;  Laterality: Right;   CYSTOSCOPY WITH RETROGRADE PYELOGRAM, URETEROSCOPY AND STENT PLACEMENT Bilateral 10/13/2017   Procedure: CYSTOSCOPY WITH RETROGRADE PYELOGRAM, BILATERAL URETEROSCOPY AND BILATERAL STENT PLACEMENT, LASER;  Surgeon: [name], MD;  Location: [facility];  Service: Urology;  Laterality: Bilateral;  90 MINS   CYSTOSCOPY WITH RETROGRADE PYELOGRAM, URETEROSCOPY AND STENT PLACEMENT Bilateral 03/24/2018   Procedure: CYSTOSCOPY WITH RETROGRADE PYELOGRAM, URETEROSCOPY AND STENT PLACEMENT;  Surgeon: [name],  MD;  Location: [facility];  Service: Urology;  Laterality: Bilateral;  1 HR   CYSTOSCOPY WITH RETROGRADE PYELOGRAM, URETEROSCOPY AND STENT PLACEMENT Bilateral 07/07/2018   Procedure: CYSTOSCOPY WITH RETROGRADE PYELOGRAM, URETEROSCOPY AND STENT PLACEMENT;  Surgeon: [name], MD;  Location: [facility];  Service: Urology;  Laterality: Bilateral;  75 MINUTES   CYSTOSCOPY WITH RETROGRADE PYELOGRAM, URETEROSCOPY AND STENT PLACEMENT Left 01/12/2019   Procedure: CYSTOSCOPY WITH RETROGRADE PYELOGRAM, URETEROSCOPY AND STENT PLACEMENT;  Surgeon: [name], MD;  Location: [facility];  Service: Urology;  Laterality: Left;  90 MINS   CYSTOSCOPY WITH RETROGRADE PYELOGRAM, URETEROSCOPY AND STENT PLACEMENT Left 01/28/2019   Procedure: CYSTOSCOPY WITH RETROGRADE PYELOGRAM, URETEROSCOPY AND STENT EXCHANGE STONE BASKET EXTRACTION ;  Surgeon: [name], MD;  Location: [facility];  Service: Urology;  Laterality: Left;   CYSTOSCOPY WITH RETROGRADE PYELOGRAM, URETEROSCOPY AND STENT PLACEMENT Bilateral 05/13/2019   Procedure: CYSTOSCOPY WITH BILATERAL  RETROGRADE PYELOGRAM, LEFT URETEROSCOPY WITH HOLMIUM LASER AND STENT PLACEMENT;  Surgeon: [name], MD;  Location: [facility];  Service: Urology;  Laterality: Bilateral;   CYSTOSCOPY WITH RETROGRADE PYELOGRAM, URETEROSCOPY AND STENT PLACEMENT Bilateral 11/02/2019   Procedure: CYSTOSCOPY WITH RETROGRADE PYELOGRAM, URETEROSCOPY AND STENT PLACEMENT;  Surgeon: [name], MD;  Location: [facility];  Service: Urology;  Laterality: Bilateral;  90 MINS   CYSTOSCOPY/RETROGRADE/URETEROSCOPY/STONE EXTRACTION WITH BASKET Left 10/05/2017   Procedure: CYSTOSCOPY/RETROGRADE/STONE REMOVAL FROM BLADDER ;  Surgeon: Bjorn Pippin, MD;  Location: [facility];  Service: Urology;  Laterality: Left;   CYSTOSCOPY/URETEROSCOPY/HOLMIUM LASER/STENT PLACEMENT Right 09/28/2017   Procedure: CYSTOSCOPY/RIGHT RETROGRADE URETEROSCOPY/HOLMIUM LASER/ RIGHT STENT PLACEMENT;   Surgeon: Rene Paci, MD;  Location: [facility];  Service: Urology;  Laterality: Right;  CYSTOSCOPY/URETEROSCOPY/HOLMIUM LASER/STENT PLACEMENT Bilateral 01/27/2018   Procedure: CYSTOSCOPY/URETEROSCOPY/HOLMIUM LASER/STENT PLACEMENT;  Surgeon: [name], MD;  Location: [facility];  Service: Urology;  Laterality: Bilateral;   HOLMIUM LASER APPLICATION Bilateral 06/26/2017   Procedure: HOLMIUM LASER APPLICATION;  Surgeon: [name], MD;  Location: [facility];  Service: Urology;  Laterality: Bilateral;   HOLMIUM LASER APPLICATION Right 09/18/2017   Procedure: HOLMIUM LASER APPLICATION;  Surgeon: Crist Fat, MD;  Location: [facility];  Service: Urology;  Laterality: Right;   HOLMIUM LASER APPLICATION Bilateral 10/13/2017   Procedure: HOLMIUM LASER APPLICATION;  Surgeon: [name], MD;  Location: [facility];  Service: Urology;  Laterality: Bilateral;   HOLMIUM LASER APPLICATION Bilateral 01/27/2018   Procedure: HOLMIUM LASER APPLICATION;  Surgeon: [name], MD;  Location: [facility];  Service: Urology;  Laterality: Bilateral;   HOLMIUM LASER APPLICATION Bilateral 03/24/2018   Procedure: HOLMIUM LASER APPLICATION;  Surgeon: [name], MD;  Location: [facility];  Service: Urology;  Laterality: Bilateral;   HOLMIUM LASER APPLICATION Bilateral 07/07/2018   Procedure: HOLMIUM LASER APPLICATION;  Surgeon: [name], MD;  Location: [facility];  Service: Urology;  Laterality: Bilateral;   HOLMIUM LASER APPLICATION Left 01/12/2019   Procedure: HOLMIUM LASER APPLICATION;  Surgeon: [name], MD;  Location: [facility];  Service: Urology;  Laterality: Left;   HOLMIUM LASER APPLICATION Bilateral 11/02/2019   Procedure: HOLMIUM LASER APPLICATION;  Surgeon: [name], MD;  Location: [facility];  Service: Urology;  Laterality: Bilateral;   PERCUTANEOUS NEPHROSTOLITHOTOMY  2003;  2009;  06-22-2014; 01-30-2015  @ [facility]    URETEROSCOPIC STONE MANIPULATION UNILATERAL  multiple since 1997--  last one 02-19-2017  @ [facility]    Home Medications:    Allergies:  Allergies  Allergen Reactions   Morphine Nausea And Vomiting   Other Hives and Swelling    Mangos    History reviewed. No pertinent family history.  Social History:  reports that he has never smoked. He has never used smokeless tobacco. He reports that he does not drink alcohol and does not use drugs.  ROS: A complete review of systems was performed.  All systems are negative except for pertinent findings as noted.  Physical Exam:  Vital signs in last 24 hours: BP 124/81   Pulse 74   Temp 97.9 F (36.6 C) (Oral)   Resp 16   Ht 5\' 9"  (1.753 m)   Wt 104.3 kg   SpO2 94%   BMI 33.97 kg/m  Constitutional:  Alert and oriented, No acute distress Cardiovascular: Regular rate  Respiratory: Normal respiratory effort GI: Abdomen is soft, nontender, nondistended, no abdominal masses. No CVAT.  Genitourinary: Normal male phallus, testes are descended bilaterally and non-tender and without masses, scrotum is normal in appearance without lesions or masses, perineum is normal on inspection. Lymphatic: No lymphadenopathy Neurologic: Grossly intact, no focal deficits Psychiatric: Normal mood and affect  Laboratory Data:  Recent Labs    08/08/20 0500  WBC 5.8  HGB 14.8  HCT 43.1  PLT 224    Recent Labs    08/08/20 0401  NA 137  K 5.1  CL 105  GLUCOSE 99  BUN 20  CALCIUM 9.3  CREATININE 1.08     Results for orders placed or performed during the hospital encounter of 08/08/20 (from the past 24 hour(s))  Urinalysis, Routine w reflex microscopic Urine, Clean Catch     Status: Abnormal   Collection Time: 08/08/20  2:33 AM  Result Value Ref  Range   Color, Urine YELLOW YELLOW   APPearance HAZY (A) CLEAR   Specific Gravity, Urine 1.020 1.005 - 1.030   pH 7.0 5.0 - 8.0   Glucose, UA NEGATIVE NEGATIVE mg/dL   Hgb urine dipstick MODERATE  (A) NEGATIVE   Bilirubin Urine NEGATIVE NEGATIVE   Ketones, ur NEGATIVE NEGATIVE mg/dL   Protein, ur 161100 (A) NEGATIVE mg/dL   Nitrite NEGATIVE NEGATIVE   Leukocytes,Ua NEGATIVE NEGATIVE   RBC / HPF >50 (H) 0 - 5 RBC/hpf   WBC, UA 0-5 0 - 5 WBC/hpf   Bacteria, UA NONE SEEN NONE SEEN   Squamous Epithelial / LPF 0-5 0 - 5   Mucus PRESENT   Comprehensive metabolic panel     Status: Abnormal   Collection Time: 08/08/20  4:01 AM  Result Value Ref Range   Sodium 137 135 - 145 mmol/L   Potassium 5.1 3.5 - 5.1 mmol/L   Chloride 105 98 - 111 mmol/L   CO2 24 22 - 32 mmol/L   Glucose, Bld 99 70 - 99 mg/dL   BUN 20 6 - 20 mg/dL   Creatinine, Ser 0.961.08 0.61 - 1.24 mg/dL   Calcium 9.3 8.9 - 04.510.3 mg/dL   Total Protein 7.3 6.5 - 8.1 g/dL   Albumin 4.3 3.5 - 5.0 g/dL   AST 41 15 - 41 U/L   ALT 29 0 - 44 U/L   Alkaline Phosphatase 62 38 - 126 U/L   Total Bilirubin 1.5 (H) 0.3 - 1.2 mg/dL   GFR, Estimated >40>60 >98>60 mL/min   Anion gap 8 5 - 15  CBC with Differential/Platelet     Status: None   Collection Time: 08/08/20  5:00 AM  Result Value Ref Range   WBC 5.8 4.0 - 10.5 K/uL   RBC 4.40 4.22 - 5.81 MIL/uL   Hemoglobin 14.8 13.0 - 17.0 g/dL   HCT 11.943.1 14.739.0 - 82.952.0 %   MCV 98.0 80.0 - 100.0 fL   MCH 33.6 26.0 - 34.0 pg   MCHC 34.3 30.0 - 36.0 g/dL   RDW 56.211.7 13.011.5 - 86.515.5 %   Platelets 224 150 - 400 K/uL   nRBC 0.0 0.0 - 0.2 %   Neutrophils Relative % 68 %   Neutro Abs 3.9 1.7 - 7.7 K/uL   Lymphocytes Relative 20 %   Lymphs Abs 1.2 0.7 - 4.0 K/uL   Monocytes Relative 11 %   Monocytes Absolute 0.6 0.1 - 1.0 K/uL   Eosinophils Relative 1 %   Eosinophils Absolute 0.1 0.0 - 0.5 K/uL   Basophils Relative 0 %   Basophils Absolute 0.0 0.0 - 0.1 K/uL   Immature Granulocytes 0 %   Abs Immature Granulocytes 0.01 0.00 - 0.07 K/uL  Resp Panel by RT-PCR (Flu A&B, Covid) Nasopharyngeal Swab     Status: None   Collection Time: 08/08/20  6:55 AM   Specimen: Nasopharyngeal Swab; Nasopharyngeal(NP) swabs  in vial transport medium  Result Value Ref Range   SARS Coronavirus 2 by RT PCR NEGATIVE NEGATIVE   Influenza A by PCR NEGATIVE NEGATIVE   Influenza B by PCR NEGATIVE NEGATIVE   Recent Results (from the past 240 hour(s))  Resp Panel by RT-PCR (Flu A&B, Covid) Nasopharyngeal Swab     Status: None   Collection Time: 08/08/20  6:55 AM   Specimen: Nasopharyngeal Swab; Nasopharyngeal(NP) swabs in vial transport medium  Result Value Ref Range Status   SARS Coronavirus 2 by RT PCR NEGATIVE NEGATIVE Final  Comment: (NOTE) SARS-CoV-2 target nucleic acids are NOT DETECTED.  The SARS-CoV-2 RNA is generally detectable in upper respiratory specimens during the acute phase of infection. The lowest concentration of SARS-CoV-2 viral copies this assay can detect is 138 copies/mL. A negative result does not preclude SARS-Cov-2 infection and should not be used as the sole basis for treatment or other patient management decisions. A negative result may occur with  improper specimen collection/handling, submission of specimen other than nasopharyngeal swab, presence of viral mutation(s) within the areas targeted by this assay, and inadequate number of viral copies(<138 copies/mL). A negative result must be combined with clinical observations, patient history, and epidemiological information. The expected result is Negative.  Fact Sheet for Patients:  BloggerCourse.com  Fact Sheet for Healthcare Providers:  SeriousBroker.it  This test is no t yet approved or cleared by the [place] FDA and  has been authorized for detection and/or diagnosis of SARS-CoV-2 by FDA under an Emergency Use Authorization (EUA). This EUA will remain  in effect (meaning this test can be used) for the duration of the COVID-19 declaration under Section 564(b)(1) of the Act, 21 U.S.C.section 360bbb-3(b)(1), unless the authorization is terminated  or revoked sooner.        Influenza A by PCR NEGATIVE NEGATIVE Final   Influenza B by PCR NEGATIVE NEGATIVE Final    Comment: (NOTE) The Xpert Xpress SARS-CoV-2/FLU/RSV plus assay is intended as an aid in the diagnosis of influenza from Nasopharyngeal swab specimens and should not be used as a sole basis for treatment. Nasal washings and aspirates are unacceptable for Xpert Xpress SARS-CoV-2/FLU/RSV testing.  Fact Sheet for Patients: BloggerCourse.com  Fact Sheet for Healthcare Providers: SeriousBroker.it  This test is not yet approved or cleared by the [place] FDA and has been authorized for detection and/or diagnosis of SARS-CoV-2 by FDA under an Emergency Use Authorization (EUA). This EUA will remain in effect (meaning this test can be used) for the duration of the COVID-19 declaration under Section 564(b)(1) of the Act, 21 U.S.C. section 360bbb-3(b)(1), unless the authorization is terminated or revoked.  Performed at [facility], [address] [place], [place] [address]     Renal Function: Recent Labs    08/08/20 0401  CREATININE 1.08   Estimated Creatinine Clearance: 108.2 mL/min (by C-G formula based on SCr of 1.08 mg/dL).  Radiologic Imaging: CT Renal Stone Study  Result Date: 08/08/2020 CLINICAL DATA:  Flank pain with kidney stone suspected EXAM: CT ABDOMEN AND PELVIS WITHOUT CONTRAST TECHNIQUE: Multidetector CT imaging of the abdomen and pelvis was performed following the standard protocol without IV contrast. COMPARISON:  04/19/2020 FINDINGS: Lower chest:  No contributory findings. Hepatobiliary: No focal liver abnormality.No evidence of biliary obstruction or stone. Pancreas: Unremarkable. Spleen: Unremarkable. Adrenals/Urinary Tract: Negative adrenals. Right hydroureteronephrosis due to a mid ureteral calculus measuring 6 x 13 mm, just above the iliac crossing. No left hydronephrosis. At least 6 left renal calculi  measuring up to 3 mm in the interpolar region. Left renal scarring with changes of prior percutaneous access. Three right renal calculi measuring up to 14 mm at the lower pole. Negative bladder. Stomach/Bowel:  No obstruction. No visible bowel inflammation. Vascular/Lymphatic: No acute vascular abnormality. No mass or adenopathy. Reproductive:No pathologic findings. Other: No ascites or pneumoperitoneum. Fatty enlargement of the bilateral inguinal canal. Musculoskeletal: No acute abnormalities. IMPRESSION: 1. Obstructing 13 x 6 mm right ureteral stone at the level of the iliac crossing. 2. Bilateral nephrolithiasis and left renal cortical scarring. Electronically Signed  By: Marnee Spring M.D.   On: 08/08/2020 06:31    Impression/Assessment:  Right ureteral stone  Plan:  Cystoscopy, right retrograde ureteropyelogram, right ureteroscopy, holmium laser and extraction of right ureteral stone, double-J stent placement

## 2020-08-08 NOTE — Anesthesia Preprocedure Evaluation (Signed)
Anesthesia Evaluation  Patient identified by MRN, date of birth, ID band Patient awake    Reviewed: Allergy & Precautions, NPO status , Patient's Chart, lab work & pertinent test results  History of Anesthesia Complications Negative for: history of anesthetic complications  Airway Mallampati: I  TM Distance: >3 FB Neck ROM: Full    Dental  (+) Dental Advisory Given, Teeth Intact   Pulmonary neg pulmonary ROS,  Covid-19 Nucleic Acid Test Results Lab Results      Component                Value               Date                      SARSCOV2NAA              NEGATIVE            08/08/2020                SARSCOV2NAA              NEGATIVE            10/29/2019                SARSCOV2NAA              NEGATIVE            05/10/2019                SARSCOV2NAA              NOT DETECTED        01/25/2019                SARSCOV2NAA              NEGATIVE            01/11/2019               breath sounds clear to auscultation       Cardiovascular Exercise Tolerance: Good negative cardio ROS   Rhythm:Regular     Neuro/Psych negative neurological ROS  negative psych ROS   GI/Hepatic GERD  Medicated and Controlled,  Endo/Other  negative endocrine ROS  Renal/GU Renal disease     Musculoskeletal negative musculoskeletal ROS (+)   Abdominal   Peds  Hematology negative hematology ROS (+) Lab Results      Component                Value               Date                      WBC                      5.8                 08/08/2020                HGB                      14.8                08/08/2020                HCT  43.1                08/08/2020                MCV                      98.0                08/08/2020                PLT                      224                 08/08/2020              Anesthesia Other Findings   Reproductive/Obstetrics                              Anesthesia Physical Anesthesia Plan  ASA: 1  Anesthesia Plan: General   Post-op Pain Management:    Induction:   PONV Risk Score and Plan: 2 and Dexamethasone, Ondansetron and Treatment may vary due to age or medical condition  Airway Management Planned: LMA and Oral ETT  Additional Equipment: None  Intra-op Plan:   Post-operative Plan: Extubation in OR  Informed Consent: I have reviewed the patients History and Physical, chart, labs and discussed the procedure including the risks, benefits and alternatives for the proposed anesthesia with the patient or authorized representative who has indicated his/her understanding and acceptance.       Plan Discussed with: CRNA and Surgeon  Anesthesia Plan Comments:         Anesthesia Quick Evaluation

## 2020-08-08 NOTE — Anesthesia Procedure Notes (Signed)
Procedure Name: LMA Insertion Date/Time: 08/08/2020 4:16 PM Performed by: Yolonda Kida, CRNA Pre-anesthesia Checklist: Patient identified, Emergency Drugs available, Suction available and Patient being monitored Patient Re-evaluated:Patient Re-evaluated prior to induction Oxygen Delivery Method: Circle system utilized Preoxygenation: Pre-oxygenation with 100% oxygen Induction Type: IV induction LMA: LMA inserted LMA Size: 4.0 Number of attempts: 1 Placement Confirmation: positive ETCO2 and breath sounds checked- equal and bilateral Tube secured with: Tape Dental Injury: Teeth and Oropharynx as per pre-operative assessment

## 2020-08-08 NOTE — Transfer of Care (Signed)
Immediate Anesthesia Transfer of Care Note  Patient: Devin Sanders  Procedure(s) Performed: CYSTOSCOPY/URETEROSCOPY/RETROGRADE/ HOLMIUM LASER/STENT PLACEMENT (Right: Ureter)  Patient Location: PACU  Anesthesia Type:General  Level of Consciousness: drowsy  Airway & Oxygen Therapy: Patient Spontanous Breathing and Patient connected to face mask oxygen  Post-op Assessment: Report given to RN and Post -op Vital signs reviewed and stable  Post vital signs: Reviewed and stable  Last Vitals:  Vitals Value Taken Time  BP 135/84 08/08/20 1719  Temp    Pulse 77 08/08/20 1721  Resp 8 08/08/20 1721  SpO2 100 % 08/08/20 1721  Vitals shown include unvalidated device data.  Last Pain:  Vitals:   08/08/20 0439  TempSrc:   PainSc: 4          Complications: No notable events documented.

## 2020-08-08 NOTE — Discharge Instructions (Signed)
You may see some blood in the urine and may have some burning with urination for 48-72 hours. You also may notice that you have to urinate more frequently or urgently after your procedure which is normal.  You should call should you develop an inability urinate, fever > 101, persistent nausea and vomiting that prevents you from eating or drinking to stay hydrated.  If you have a stent, you will likely urinate more frequently and urgently until the stent is removed and you may experience some discomfort/pain in the lower abdomen and flank especially when urinating. You may take pain medication prescribed to you if needed for pain. You may also intermittently have blood in the urine until the stent is removed.  It is okay to pull the thread to remove the stent next Monday morning.

## 2020-08-08 NOTE — Op Note (Signed)
Preoperative diagnosis: Right mid ureteral calculus with hydronephrosis  Postoperative diagnosis: Same, with management of both right mid ureteral and right lower pole calculus  Principal procedure: Cystoscopy, right retrograde ureteropyelogram, fluoroscopic interpretation, right ureteroscopy with semirigid and flexible ureteroscope, holmium laser dusting of right mid ureteral and lower pole calculi, placement of 6 French by 26 cm contour double-J stent with tether, fluoroscopic interpretation  Surgeon: Ruston Fedora  Anesthesia: General with LMA  Complications: None  Estimated blood loss: None  Specimen: None  Drains: None  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  He presented earlier today with significant pain from the right mid ureteral stone that was approximately 8 x 13 mm in size.  He did have proximal hydroureteronephrosis.  In the lower pole the right kidney there was a similar size stone.  Because of the fact that he was unable to be made comfortable, as well as the large size of this mid ureteral stone, he presents at this time for management.  I have discussed cystoscopy, retrograde, ureteroscopy with laser and possible extraction of the mid ureteral stone.  Thought would be to take a staged approach and do the right lower pole stone at a later date.  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  He desires to proceed.  Findings: Urethra was normal.  Prostate nonobstructive.  Bladder was normal, with normal urothelium, no trabeculations.  Ureteral orifice ease were normal in configuration and location.  Retrograde study of the right ureter revealed a normal distal ureter.  At the pelvic brim there was a filling defect consistent with the prior mentioned ureteral stone.  There is proximal hydroureteronephrosis.  There was a filling defect in the right lower pole consistent with the other  stone.  Description of procedure: The patient was properly identified and marked in the holding area.  He received 2 g of Ancef intravenously.  He is taken to the operating room where general anesthetic was administered with the LMA.  He was placed in the dorsolithotomy position.  Genitalia and perineum were prepped, draped, proper timeout performed.  21 French panendoscope was advanced under direct vision into his bladder.  Circumferential inspection was performed, retrograde study was performed using of 6 [place] open-ended catheter and Omnipaque.  The above-mentioned findings were noted.  Following this, sensor tip guidewire was easily advanced through the open-ended catheter and into the upper pole calyceal system, documented with fluoroscopy.  I then removed the cystoscope.  Distal ureter was dilated first with the inner core and then the entire 12/14 ureteral access catheter.  This was then removed.  6 French semirigid dual-lumen scope was then advanced.  It was quite difficult to negotiate over the pelvis.  Because of this, I placed a Super Stiff guidewire to assist with straightening of the ureter.  This enabled me to reach the stone.  The 365 m fiber was then passed through the scope.  I then treated the stone with "dust" setting.  I was able to treat the posterior part of the stone.  However, because of the curve of the ureter and the cystoscope, I was unable to get the most anterior part of the stone in the ureter.  I then backed the semirigid scope out.  I passed the ureteral access catheter, leaving a safety wire in.  The dual-lumen flexible ureteroscope was then utilized to access the ureter.  I passed the remaining ureteral stone, and negotiated the  scope easily up into the renal pelvis.  I then encountered the larger stone sitting in the lower pole calyceal system in a dependent position.  I utilized the dust settings as well for the stone, and totally ablated the stone into smaller fragments,  I would estimate that the largest of these was perhaps 2 mm in size.  Fragments wound up in the posterior calyces in the upper, interpolar and lower pole or areas.  After all the larger fragments were dusted, I backed the scope down into the proximal ureter where the remaining ureteral stone was.  I treated the stone the same with the dusting setting.  Again, multiple small fragments resulted.  I did not feel any of these needed to be extracted.  At this point, after inspection and there being no significant stone matter remaining, I have removed the ureteroscope.  The access catheter was removed as well.  Th guidewire was backloaded through the cystoscope.  I then passed a 26 cm a 6 [place] contour double-J stent.  This was easily advanced up to the ureter using fluoroscopic and cystoscopic guidance.  Once properly positioned, it was deployed in the ureter with excellent proximal and distal curl seen with fluoroscopy and cystoscopy, respectively.  The bladder was drained.  The scope was removed.  The tether was left intact.  It was tied in a knot outside the urethral meatus, trimmed, and then taped to his penis.  At this point, the procedure was terminated.  The patient was awakened, taken to the PACU in stable condition, having tolerated procedure well.

## 2020-08-09 ENCOUNTER — Encounter (HOSPITAL_COMMUNITY): Payer: Self-pay | Admitting: Urology

## 2020-08-09 LAB — URINE CULTURE

## 2020-08-13 ENCOUNTER — Encounter (HOSPITAL_COMMUNITY): Payer: Self-pay | Admitting: *Deleted

## 2020-08-13 ENCOUNTER — Emergency Department (HOSPITAL_COMMUNITY): Payer: PRIVATE HEALTH INSURANCE

## 2020-08-13 ENCOUNTER — Other Ambulatory Visit: Payer: Self-pay

## 2020-08-13 ENCOUNTER — Emergency Department (HOSPITAL_COMMUNITY)
Admission: EM | Admit: 2020-08-13 | Discharge: 2020-08-13 | Disposition: A | Discharge status: Home / Self Care | Payer: PRIVATE HEALTH INSURANCE | Attending: Emergency Medicine | Admitting: Emergency Medicine

## 2020-08-13 DIAGNOSIS — N132 Hydronephrosis with renal and ureteral calculous obstruction: Secondary | ICD-10-CM | POA: Diagnosis not present

## 2020-08-13 DIAGNOSIS — Z8616 Personal history of COVID-19: Secondary | ICD-10-CM | POA: Insufficient documentation

## 2020-08-13 DIAGNOSIS — R1031 Right lower quadrant pain: Secondary | ICD-10-CM | POA: Diagnosis present

## 2020-08-13 DIAGNOSIS — N200 Calculus of kidney: Secondary | ICD-10-CM

## 2020-08-13 LAB — CBC WITH DIFFERENTIAL/PLATELET
Abs Immature Granulocytes: 0.02 10*3/uL (ref 0.00–0.07)
Basophils Absolute: 0 10*3/uL (ref 0.0–0.1)
Basophils Relative: 0 %
Eosinophils Absolute: 0.1 10*3/uL (ref 0.0–0.5)
Eosinophils Relative: 1 %
HCT: 41.6 % (ref 39.0–52.0)
Hemoglobin: 14.8 g/dL (ref 13.0–17.0)
Immature Granulocytes: 0 %
Lymphocytes Relative: 8 %
Lymphs Abs: 0.8 10*3/uL (ref 0.7–4.0)
MCH: 33.5 pg (ref 26.0–34.0)
MCHC: 35.6 g/dL (ref 30.0–36.0)
MCV: 94.1 fL (ref 80.0–100.0)
Monocytes Absolute: 0.8 10*3/uL (ref 0.1–1.0)
Monocytes Relative: 8 %
Neutro Abs: 8.2 10*3/uL — ABNORMAL HIGH (ref 1.7–7.7)
Neutrophils Relative %: 83 %
Platelets: 245 10*3/uL (ref 150–400)
RBC: 4.42 MIL/uL (ref 4.22–5.81)
RDW: 11.1 % — ABNORMAL LOW (ref 11.5–15.5)
WBC: 10 10*3/uL (ref 4.0–10.5)
nRBC: 0 % (ref 0.0–0.2)

## 2020-08-13 LAB — BASIC METABOLIC PANEL
Anion gap: 10 (ref 5–15)
BUN: 16 mg/dL (ref 6–20)
CO2: 24 mmol/L (ref 22–32)
Calcium: 9.1 mg/dL (ref 8.9–10.3)
Chloride: 99 mmol/L (ref 98–111)
Creatinine, Ser: 1.18 mg/dL (ref 0.61–1.24)
GFR, Estimated: >60 mL/min (ref >60)
Glucose, Bld: 101 mg/dL — ABNORMAL HIGH (ref 70–99)
Potassium: 4.2 mmol/L (ref 3.5–5.1)
Sodium: 133 mmol/L — ABNORMAL LOW (ref 135–145)

## 2020-08-13 LAB — URINALYSIS, ROUTINE W REFLEX MICROSCOPIC
Bacteria, UA: NONE SEEN
Glucose, UA: NEGATIVE mg/dL
Ketones, ur: NEGATIVE mg/dL
Nitrite: NEGATIVE
Protein, ur: 100 mg/dL — AB
RBC / HPF: >50 RBC/hpf — ABNORMAL HIGH (ref 0–5)
Specific Gravity, Urine: 1.013 (ref 1.005–1.030)
pH: 7 (ref 5.0–8.0)

## 2020-08-13 MED ORDER — ONDANSETRON HCL 4 MG/2ML IJ SOLN
4.0000 mg | Freq: Once | INTRAMUSCULAR | Status: AC
  Administered 2020-08-13: 4 mg via INTRAVENOUS
  Filled 2020-08-13: qty 2

## 2020-08-13 MED ORDER — DIPHENHYDRAMINE HCL 25 MG PO CAPS
25.0000 mg | ORAL_CAPSULE | Freq: Once | ORAL | Status: AC
  Administered 2020-08-13: 25 mg via ORAL
  Filled 2020-08-13: qty 1

## 2020-08-13 MED ORDER — HYDROMORPHONE HCL 1 MG/ML IJ SOLN
0.5000 mg | Freq: Once | INTRAMUSCULAR | Status: AC
Start: 2020-08-13 — End: 2020-08-13
  Administered 2020-08-13: 0.5 mg via INTRAVENOUS
  Filled 2020-08-13: qty 1

## 2020-08-13 MED ORDER — KETOROLAC TROMETHAMINE 30 MG/ML IJ SOLN
30.0000 mg | Freq: Once | INTRAMUSCULAR | Status: AC
  Administered 2020-08-13: 30 mg via INTRAVENOUS
  Filled 2020-08-13: qty 1

## 2020-08-13 MED ORDER — HYDROMORPHONE HCL 1 MG/ML IJ SOLN
1.0000 mg | Freq: Once | INTRAMUSCULAR | Status: AC
Start: 2020-08-13 — End: 2020-08-13
  Administered 2020-08-13: 1 mg via INTRAVENOUS
  Filled 2020-08-13: qty 1

## 2020-08-13 MED ORDER — HYDROCODONE-ACETAMINOPHEN 5-325 MG PO TABS: 1.0000 | ORAL_TABLET | Freq: Four times a day (QID) | ORAL | 0 refills | Status: DC | PRN

## 2020-08-13 MED ORDER — SODIUM CHLORIDE 0.9 % IV BOLUS
1000.0000 mL | Freq: Once | INTRAVENOUS | Status: AC
  Administered 2020-08-13: 1000 mL via INTRAVENOUS

## 2020-08-13 NOTE — ED Triage Notes (Signed)
Pt complains of right flank pain. He had procedure last week to break down kidney stones and had stent placed. He removed stent this morning and has had increased pain since.

## 2020-08-13 NOTE — ED Provider Notes (Signed)
[facility]-EMERGENCY DEPT Provider Note   CSN: [id number] Arrival date & time: 08/13/20  0749     History Chief Complaint  Patient presents with   Flank Pain    Devin Sanders is a 40 y.o. male with a past medical history of kidney stones, cystinuria presenting to the ED with a chief complaint of right sided abdominal pain.  States that he had a procedure done on 08/08/2020 to extract a right ureteral stone with a stent placed.  This morning was instructed to remove stent which he did.  Since then he has had worsening right lower quadrant pain and right flank pain.  Reports hematuria and seeing fragments of the stone when he removed the stent.  Denies any fever.  Ports associated nausea and vomiting.  Has not tried medications to help with pain or nausea at home.  No changes to bowel movements.  No dysuria.  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)   GERD (gastroesophageal reflux disease)    pt denied   History of kidney stones    Left ureteral calculus    Renal calculus, left    Wears glasses     Patient Active Problem List   Diagnosis Date Noted   Ureteral stone with hydronephrosis 08/08/2020   Hydronephrosis of right kidney 09/28/2017    Past Surgical History:  Procedure Laterality Date   CYSTOSCOPY WITH RETROGRADE PYELOGRAM, URETEROSCOPY AND STENT PLACEMENT Bilateral 06/26/2017   Procedure: CYSTOSCOPY WITH RETROGRADE PYELOGRAM, URETEROSCOPY, STONE BASKETRY  AND STENT PLACEMENT;  Surgeon: [name], MD;  Location: [facility];  Service: Urology;  Laterality: Bilateral;   CYSTOSCOPY WITH RETROGRADE PYELOGRAM, URETEROSCOPY AND STENT PLACEMENT Right 09/18/2017   Procedure: CYSTOSCOPY WITH RETROGRADE PYELOGRAM, URETEROSCOPY AND STENT PLACEMENT;  Surgeon: Crist Fat, MD;  Location: [facility];  Service: Urology;   Laterality: Right;   CYSTOSCOPY WITH RETROGRADE PYELOGRAM, URETEROSCOPY AND STENT PLACEMENT Bilateral 10/13/2017   Procedure: CYSTOSCOPY WITH RETROGRADE PYELOGRAM, BILATERAL URETEROSCOPY AND BILATERAL STENT PLACEMENT, LASER;  Surgeon: [name], MD;  Location: [facility];  Service: Urology;  Laterality: Bilateral;  90 MINS   CYSTOSCOPY WITH RETROGRADE PYELOGRAM, URETEROSCOPY AND STENT PLACEMENT Bilateral 03/24/2018   Procedure: CYSTOSCOPY WITH RETROGRADE PYELOGRAM, URETEROSCOPY AND STENT PLACEMENT;  Surgeon: [name], MD;  Location: [facility];  Service: Urology;  Laterality: Bilateral;  1 HR   CYSTOSCOPY WITH RETROGRADE PYELOGRAM, URETEROSCOPY AND STENT PLACEMENT Bilateral 07/07/2018   Procedure: CYSTOSCOPY WITH RETROGRADE PYELOGRAM, URETEROSCOPY AND STENT PLACEMENT;  Surgeon: [name], MD;  Location: [facility];  Service: Urology;  Laterality: Bilateral;  75 MINUTES   CYSTOSCOPY WITH RETROGRADE PYELOGRAM, URETEROSCOPY AND STENT PLACEMENT Left 01/12/2019   Procedure: CYSTOSCOPY WITH RETROGRADE PYELOGRAM, URETEROSCOPY AND STENT PLACEMENT;  Surgeon: [name], MD;  Location: [facility];  Service: Urology;  Laterality: Left;  90 MINS   CYSTOSCOPY WITH RETROGRADE PYELOGRAM, URETEROSCOPY AND STENT PLACEMENT Left 01/28/2019   Procedure: CYSTOSCOPY WITH RETROGRADE PYELOGRAM, URETEROSCOPY AND STENT EXCHANGE STONE BASKET EXTRACTION ;  Surgeon: [name], MD;  Location: [facility];  Service: Urology;  Laterality: Left;   CYSTOSCOPY WITH RETROGRADE PYELOGRAM, URETEROSCOPY AND STENT PLACEMENT Bilateral 05/13/2019   Procedure: CYSTOSCOPY WITH BILATERAL  RETROGRADE PYELOGRAM, LEFT URETEROSCOPY WITH HOLMIUM LASER AND STENT PLACEMENT;  Surgeon: [name], MD;  Location: [facility];  Service: Urology;  Laterality: Bilateral;  CYSTOSCOPY WITH RETROGRADE PYELOGRAM, URETEROSCOPY AND STENT PLACEMENT Bilateral 11/02/2019   Procedure: CYSTOSCOPY WITH RETROGRADE PYELOGRAM, URETEROSCOPY AND STENT  PLACEMENT;  Surgeon: [name], MD;  Location: [facility];  Service: Urology;  Laterality: Bilateral;  90 MINS   CYSTOSCOPY/RETROGRADE/URETEROSCOPY/STONE EXTRACTION WITH BASKET Left 10/05/2017   Procedure: CYSTOSCOPY/RETROGRADE/STONE REMOVAL FROM BLADDER ;  Surgeon: Bjorn Pippin, MD;  Location: [facility];  Service: Urology;  Laterality: Left;   CYSTOSCOPY/URETEROSCOPY/HOLMIUM LASER/STENT PLACEMENT Right 09/28/2017   Procedure: CYSTOSCOPY/RIGHT RETROGRADE URETEROSCOPY/HOLMIUM LASER/ RIGHT STENT PLACEMENT;  Surgeon: Rene Paci, MD;  Location: [facility];  Service: Urology;  Laterality: Right;   CYSTOSCOPY/URETEROSCOPY/HOLMIUM LASER/STENT PLACEMENT Bilateral 01/27/2018   Procedure: CYSTOSCOPY/URETEROSCOPY/HOLMIUM LASER/STENT PLACEMENT;  Surgeon: [name], MD;  Location: [facility];  Service: Urology;  Laterality: Bilateral;   CYSTOSCOPY/URETEROSCOPY/HOLMIUM LASER/STENT PLACEMENT Right 08/08/2020   Procedure: CYSTOSCOPY/URETEROSCOPY/RETROGRADE/ HOLMIUM LASER/STENT PLACEMENT;  Surgeon: Marcine Matar, MD;  Location: [facility];  Service: Urology;  Laterality: Right;   HOLMIUM LASER APPLICATION Bilateral 06/26/2017   Procedure: HOLMIUM LASER APPLICATION;  Surgeon: [name], MD;  Location: [facility];  Service: Urology;  Laterality: Bilateral;   HOLMIUM LASER APPLICATION Right 09/18/2017   Procedure: HOLMIUM LASER APPLICATION;  Surgeon: Crist Fat, MD;  Location: [facility];  Service: Urology;  Laterality: Right;   HOLMIUM LASER APPLICATION Bilateral 10/13/2017   Procedure: HOLMIUM LASER APPLICATION;  Surgeon: [name], MD;  Location: [facility];  Service: Urology;  Laterality: Bilateral;   HOLMIUM LASER APPLICATION Bilateral 01/27/2018   Procedure: HOLMIUM LASER APPLICATION;  Surgeon: [name], MD;  Location: [facility];  Service: Urology;  Laterality: Bilateral;   HOLMIUM LASER APPLICATION Bilateral 03/24/2018    Procedure: HOLMIUM LASER APPLICATION;  Surgeon: [name], MD;  Location: [facility];  Service: Urology;  Laterality: Bilateral;   HOLMIUM LASER APPLICATION Bilateral 07/07/2018   Procedure: HOLMIUM LASER APPLICATION;  Surgeon: [name], MD;  Location: [facility];  Service: Urology;  Laterality: Bilateral;   HOLMIUM LASER APPLICATION Left 01/12/2019   Procedure: HOLMIUM LASER APPLICATION;  Surgeon: [name], MD;  Location: [facility];  Service: Urology;  Laterality: Left;   HOLMIUM LASER APPLICATION Bilateral 11/02/2019   Procedure: HOLMIUM LASER APPLICATION;  Surgeon: [name], MD;  Location: [facility];  Service: Urology;  Laterality: Bilateral;   PERCUTANEOUS NEPHROSTOLITHOTOMY  2003;  2009;  06-22-2014; 01-30-2015  @ [place]   URETEROSCOPIC STONE MANIPULATION UNILATERAL  multiple since 1997--  last one 02-19-2017  @ [facility]       No family history on file.  Social History   Tobacco Use   Smoking status: Never   Smokeless tobacco: Never  Vaping Use   Vaping Use: Never used  Substance Use Topics   Alcohol use: Never   Drug use: Never    Home Medications Prior to Admission medications   Medication Sig Start Date End Date Taking? Authorizing Provider  HYDROcodone-acetaminophen (NORCO/VICODIN) 5-325 MG tablet Take 1 tablet by mouth every 6 (six) hours as needed. 08/13/20  Yes Araya Roel, PA-C  melatonin 5 MG TABS Take 5 mg by mouth at bedtime as needed (sleep).   Yes [provider]  oxybutynin (DITROPAN) 5 MG tablet Take 1 tablet (5 mg total) by mouth every 8 (eight) hours as needed for bladder spasms. 08/08/20  Yes Dahlstedt, Jeannett Senior, MD  traMADol (ULTRAM) 50 MG tablet Take 1 tablet (50 mg total) by mouth every 6 (six) hours as needed for moderate pain. 08/08/20 08/08/21 Yes Dahlstedt, Jeannett Senior,  MD  tamsulosin (FLOMAX) 0.4 MG CAPS capsule Take 1 capsule (0.4 mg total) by mouth daily. 04/19/20   McDonald, Mia A, PA-C    Allergies    Morphine and  Other  Review of Systems   Review of Systems  Constitutional:  Negative for appetite change and chills.  HENT:  Negative for ear pain, rhinorrhea, sneezing and sore throat.   Eyes:  Negative for photophobia and visual disturbance.  Respiratory:  Negative for cough, chest tightness and wheezing.   Cardiovascular:  Negative for palpitations.  Gastrointestinal:  Negative for blood in stool, constipation, diarrhea and nausea.  Skin:  Negative for rash.   Physical Exam Updated Vital Signs BP 123/84   Pulse 76   Temp 97.8 F (36.6 C) (Oral)   Resp 16   SpO2 91%   Physical Exam Vitals and nursing note reviewed.  Constitutional:      General: He is not in acute distress.    Appearance: He is well-developed.  HENT:     Head: Normocephalic and atraumatic.     Nose: Nose normal.  Eyes:     General: No scleral icterus.       Left eye: No discharge.     Conjunctiva/sclera: Conjunctivae normal.  Cardiovascular:     Rate and Rhythm: Normal rate and regular rhythm.     Heart sounds: Normal heart sounds. No murmur heard.   No friction rub. No gallop.  Pulmonary:     Effort: Pulmonary effort is normal. No respiratory distress.     Breath sounds: Normal breath sounds.  Abdominal:     General: Bowel sounds are normal. There is no distension.     Palpations: Abdomen is soft.     Tenderness: There is abdominal tenderness (Right lower quadrant). There is right CVA tenderness. There is no guarding.  Musculoskeletal:        General: Normal range of motion.     Cervical back: Normal range of motion and neck supple.  Skin:    General: Skin is warm and dry.     Findings: No rash.  Neurological:     Mental Status: He is alert.     Motor: No abnormal muscle tone.     Coordination: Coordination normal.    ED Results / Procedures / Treatments   Labs (all labs ordered are listed, but only abnormal results are displayed) Labs Reviewed  BASIC METABOLIC PANEL - Abnormal; Notable for the  following components:      Result Value   Sodium 133 (*)    Glucose, Bld 101 (*)    All other components within normal limits  CBC WITH DIFFERENTIAL/PLATELET - Abnormal; Notable for the following components:   RDW 11.1 (*)    Neutro Abs 8.2 (*)    All other components within normal limits  URINALYSIS, ROUTINE W REFLEX MICROSCOPIC - Abnormal; Notable for the following components:   APPearance CLOUDY (*)    Hgb urine dipstick LARGE (*)    Bilirubin Urine SMALL (*)    Protein, ur 100 (*)    Leukocytes,Ua LARGE (*)    RBC / HPF >50 (*)    All other components within normal limits  URINE CULTURE    EKG None  Radiology CT Renal Stone Study  Result Date: 08/13/2020 CLINICAL DATA:  Acute right flank pain.  Status post lithotripsy. EXAM: CT ABDOMEN AND PELVIS WITHOUT CONTRAST TECHNIQUE: Multidetector CT imaging of the abdomen and pelvis was performed following the standard protocol without IV contrast. COMPARISON:  August 08, 2020. FINDINGS: Lower chest: No acute abnormality. Hepatobiliary: No focal liver abnormality is seen. No gallstones, gallbladder wall thickening, or biliary dilatation. Pancreas: Unremarkable. No pancreatic ductal dilatation or surrounding inflammatory changes. Spleen: Normal in size without focal abnormality. Adrenals/Urinary Tract: Adrenal glands are unremarkable. Left renal cortical scarring is noted. Bilateral nephrolithiasis is noted. There has been interval lithotripsy of large calculus involving lower pole collecting system of right kidney as well as large distal right ureteral calculus. Small residual calculus remains in upper pole collecting system of right kidney. Moderate to severe right hydroureteronephrosis is noted with perinephric stranding secondary to multiple small stone fragments seen in the distal right ureter extending to the right ureterovesical junction, consistent with Steinstrasse sign. Stomach/Bowel: Stomach is within normal limits. Appendix appears  normal. No evidence of bowel wall thickening, distention, or inflammatory changes. Vascular/Lymphatic: No significant vascular findings are present. No enlarged abdominal or pelvic lymph nodes. Reproductive: Prostate is unremarkable. Other: Small bilateral fat containing inguinal hernias are noted. No ascites is noted. Musculoskeletal: No acute or significant osseous findings. IMPRESSION: Status post lithotripsy of large right renal calculus as well as large calculus seen in distal right ureter on prior exam. Moderate to severe right hydroureteronephrosis is noted with perinephric stranding secondary to multiple small stone fragments seen in the distal right ureter extending to the right ureterovesical junction, consistent with Steinstrasse sign. Small bilateral fat containing inguinal hernias are noted. Electronically Signed   By: Lupita Raider M.D.   On: 08/13/2020 09:51    Procedures Procedures   Medications Ordered in ED Medications  sodium chloride 0.9 % bolus 1,000 mL (0 mLs Intravenous Stopped 08/13/20 1107)  HYDROmorphone (DILAUDID) injection 1 mg (1 mg Intravenous Given 08/13/20 0826)  ondansetron (ZOFRAN) injection 4 mg (4 mg Intravenous Given 08/13/20 0825)  HYDROmorphone (DILAUDID) injection 0.5 mg (0.5 mg Intravenous Given 08/13/20 0921)  diphenhydrAMINE (BENADRYL) capsule 25 mg (25 mg Oral Given 08/13/20 1024)  ketorolac (TORADOL) 30 MG/ML injection 30 mg (30 mg Intravenous Given 08/13/20 1105)    ED Course  I have reviewed the triage vital signs and the nursing notes.  Pertinent labs & imaging results that were available during my care of the patient were reviewed by me and considered in my medical decision making (see chart for details).  Clinical Course as of 08/13/20 1115  Mon Aug 13, 2020  0842 Leukocytes,Ua(!): LARGE [HK]  0842 RBC / HPF(!): >50 [HK]  0842 WBC: 10.0 [HK]  0915 Creatinine: 1.18 [HK]  1045 Flomax, pain control, f/u office Dr. Annabell Howells consult. [HK]    Clinical Course  User Index [HK] Dietrich Pates, PA-C   MDM Rules/Calculators/A&P                           40 year old male with recent right ureteral stone extraction and stent placement on 08/08/2020 presenting to the ED with a chief complaint of right-sided flank pain and right lower quadrant pain.  Removed his stent this morning as he was instructed to do so with had pain since then.  Reports seeing stone fragments when he removed it.  Reports hematuria.  Denies any fever but does report nausea.  Has not tried medications at home to help with his symptoms.  On exam he has tenderness palpation of the right CVA, right lower quadrant.  No rebound or guarding.  Will obtain lab work, urinalysis and reassess.  Lab work shows urinalysis with many RBCs, large leukocytes.  This was sent for culture.  Creatinine is normal here.  No leukocytosis.  CT renal stone study shows several small stone fragments in the distal right ureter to the right UVJ causing hydronephrosis.  I spoke to on-call urologist Dr. Annabell Howells who reviewed the work-up.  He recommends pain control and Flomax if he is not already on Flomax.  Will arrange for follow-up in the office at some point this week.  Says we can try further pain control with Toradol.  Patient's pain controlled here.  Will discharge home with short course of pain medication and continued Flomax.  Tell him to call the urology office for follow-up.  Return precautions given.   Patient is hemodynamically stable, in NAD, and able to ambulate in the ED. Evaluation does not show pathology that would require ongoing emergent intervention or inpatient treatment. I explained the diagnosis to the patient. Pain has been managed and has no complaints prior to discharge. Patient is comfortable with above plan and is stable for discharge at this time. All questions were answered prior to disposition. Strict return precautions for returning to the ED were discussed. Encouraged follow up with PCP.   Prior to  providing a prescription for a controlled substance, I independently reviewed the patient's recent prescription history on the [place] Controlled Substance Reporting System. The patient had no recent or regular prescriptions and was deemed appropriate for a brief, less than 3 day prescription of narcotic for acute analgesia.  An After Visit Summary was printed and given to the patient.   Portions of this note were generated with Scientist, clinical (histocompatibility and immunogenetics). Dictation errors may occur despite best attempts at proofreading.  Final Clinical Impression(s) / ED Diagnoses Final diagnoses:  Nephrolithiasis    Rx / DC Orders ED Discharge Orders          Ordered    HYDROcodone-acetaminophen (NORCO/VICODIN) 5-325 MG tablet  Every 6 hours PRN        08/13/20 1113             Dietrich Pates, PA-C 08/13/20 1115    Koleen Distance, MD 08/13/20 1228

## 2020-08-13 NOTE — Discharge Instructions (Addendum)
Your CT scan showed multiple small stone fragments on the right side. The urologist states that this is to be expected after your procedure. Take the pain medicine as needed and take the Flomax as you are instructed. Follow-up with urologist this week. Return to the ER for worsening pain, uncontrollable vomiting or fever.

## 2020-08-14 LAB — URINE CULTURE: Culture: NO GROWTH

## 2020-08-20 NOTE — Discharge Summary (Signed)
Patient ID: LODEN LAURENT MRN: [id number] DOB/AGE: Sep 25, 1980 40 y.o.  Admit date: 08/08/2020 Discharge date: 08/20/2020  Primary Care Physician:  System, Provider Not In  Discharge Diagnoses:   right renal/ ureteral calculi Present on Admission:  Ureteral stone with hydronephrosis   Consults:  None     Discharge Medications: Allergies as of 08/08/2020       Reactions   Morphine Nausea And Vomiting   Other Hives, Swelling   Mangos        Medication List     TAKE these medications    melatonin 5 MG Tabs Take 5 mg by mouth at bedtime as needed (sleep).   oxybutynin 5 MG tablet Commonly known as: DITROPAN Take 1 tablet (5 mg total) by mouth every 8 (eight) hours as needed for bladder spasms.   tamsulosin 0.4 MG Caps capsule Commonly known as: FLOMAX Take 1 capsule (0.4 mg total) by mouth daily. What changed:  when to take this reasons to take this   traMADol 50 MG tablet Commonly known as: Ultram Take 1 tablet (50 mg total) by mouth every 6 (six) hours as needed for moderate pain.         Significant Diagnostic Studies:  CT Renal Stone Study  Result Date: 08/13/2020 CLINICAL DATA:  Acute right flank pain.  Status post lithotripsy. EXAM: CT ABDOMEN AND PELVIS WITHOUT CONTRAST TECHNIQUE: Multidetector CT imaging of the abdomen and pelvis was performed following the standard protocol without IV contrast. COMPARISON:  August 08, 2020. FINDINGS: Lower chest: No acute abnormality. Hepatobiliary: No focal liver abnormality is seen. No gallstones, gallbladder wall thickening, or biliary dilatation. Pancreas: Unremarkable. No pancreatic ductal dilatation or surrounding inflammatory changes. Spleen: Normal in size without focal abnormality. Adrenals/Urinary Tract: Adrenal glands are unremarkable. Left renal cortical scarring is noted. Bilateral nephrolithiasis is noted. There has been interval lithotripsy of large calculus involving lower pole collecting system of right  kidney as well as large distal right ureteral calculus. Small residual calculus remains in upper pole collecting system of right kidney. Moderate to severe right hydroureteronephrosis is noted with perinephric stranding secondary to multiple small stone fragments seen in the distal right ureter extending to the right ureterovesical junction, consistent with Steinstrasse sign. Stomach/Bowel: Stomach is within normal limits. Appendix appears normal. No evidence of bowel wall thickening, distention, or inflammatory changes. Vascular/Lymphatic: No significant vascular findings are present. No enlarged abdominal or pelvic lymph nodes. Reproductive: Prostate is unremarkable. Other: Small bilateral fat containing inguinal hernias are noted. No ascites is noted. Musculoskeletal: No acute or significant osseous findings. IMPRESSION: Status post lithotripsy of large right renal calculus as well as large calculus seen in distal right ureter on prior exam. Moderate to severe right hydroureteronephrosis is noted with perinephric stranding secondary to multiple small stone fragments seen in the distal right ureter extending to the right ureterovesical junction, consistent with Steinstrasse sign. Small bilateral fat containing inguinal hernias are noted. Electronically Signed   By: Lupita Raider M.D.   On: 08/13/2020 09:51    Brief H and P: For complete details please refer to admission H and P, but in brief  the patient was admitted for  management of a right ureteral stone with hydronephrosis and intractable pain.  Hospital Course:   A the patient was admitted for observation/ pain management as well as intervention of his right ureteral stone.  The ureteral and renal stone were treated with holmium laser lithotripsy and stent placement.  He had an uncomplicated procedure and was  discharged home the evening of the procedure.   Day of Discharge BP 134/88   Pulse 93   Temp 97.8 F (36.6 C) (Oral)   Resp 15   Ht 5'  9" (1.753 m)   Wt 104.3 kg   SpO2 97%   BMI 33.97 kg/m   No results found for this or any previous visit (from the past 24 hour(s)).  Physical Exam: General: Alert and awake oriented x3 not in any acute distress. HEENT: anicteric sclera, pupils reactive to light and accommodation CVS: S1-S2 clear no murmur rubs or gallops Chest: clear to auscultation bilaterally, no wheezing rales or rhonchi Abdomen: soft nontender, nondistended, normal bowel sounds, no organomegaly Extremities: no cyanosis, clubbing or edema noted bilaterally Neuro: Cranial nerves II-XII intact, no focal neurological deficits  Disposition:    Home  Diet:   no restrictions  Activity:   no restrictions    DISCHARGE FOLLOW-UP   Follow-up Information     [name], MD Follow up.   Specialty: Urology Why: We will call to set up an appointment Contact information: [address] [place] [address] [phone]                 Time spent on Discharge:    5 minutes  Signed: Bertram Millard Dayanis Bergquist 08/20/2020, 7:51 AM

## 2020-08-27 NOTE — Discharge Summary (Signed)
Patient ID: Devin Sanders MRN: [id number] DOB/AGE: 05-12-1980 40 y.o.  Admit date: 08/08/2020 Discharge date:08/08/20  Primary Care Physician:  System, Provider Not In  Discharge Diagnoses:   Present on Admission:  Ureteral stone with hydronephrosis     Discharge Medications: Allergies as of 08/08/2020       Reactions   Morphine Nausea And Vomiting   Other Hives, Swelling   Mangos        Medication List     TAKE these medications    melatonin 5 MG Tabs Take 5 mg by mouth at bedtime as needed (sleep).   oxybutynin 5 MG tablet Commonly known as: DITROPAN Take 1 tablet (5 mg total) by mouth every 8 (eight) hours as needed for bladder spasms.   tamsulosin 0.4 MG Caps capsule Commonly known as: FLOMAX Take 1 capsule (0.4 mg total) by mouth daily. What changed:  when to take this reasons to take this   traMADol 50 MG tablet Commonly known as: Ultram Take 1 tablet (50 mg total) by mouth every 6 (six) hours as needed for moderate pain.         Brief H and P: For complete details please refer to admission H and P, but in brief Pt admitted for URS of Rt renal caclculi  Hospital Course: D/C following URS Active Problems:   Ureteral stone with hydronephrosis   Day of Discharge BP 134/88   Pulse 93   Temp 97.8 F (36.6 C) (Oral)   Resp 15   Ht 5\' 9"  (1.753 m)   Wt 104.3 kg   SpO2 97%   BMI 33.97 kg/m   No results found for this or any previous visit (from the past 24 hour(s)).  Physical Exam: General: Alert and awake oriented x3 not in any acute distress. HEENT: anicteric sclera, pupils reactive to light and accommodation CVS: S1-S2 clear no murmur rubs or gallops Chest: clear to auscultation bilaterally, no wheezing rales or rhonchi Abdomen: soft nontender, nondistended, normal bowel sounds, no organomegaly Extremities: no cyanosis, clubbing or edema noted bilaterally Neuro: Cranial nerves II-XII intact, no focal neurological  deficits  Disposition:  Homew  Diet:  No restrictions  Activity:  No restrictions   TESTS THAT NEED FOLLOW-UP   N/A  DISCHARGE FOLLOW-UP   Follow-up Information     , MD Follow up.   Specialty: Urology Why: We will call to set up an appointment Contact information: [address] [place] [phone]                 Signed: [phone] Yann Biehn 08/27/2020, 7:28 AM

## 2020-09-30 ENCOUNTER — Other Ambulatory Visit: Payer: Self-pay

## 2020-09-30 ENCOUNTER — Emergency Department (HOSPITAL_COMMUNITY)
Admission: EM | Admit: 2020-09-30 | Discharge: 2020-09-30 | Disposition: A | Discharge status: Home / Self Care | Payer: PRIVATE HEALTH INSURANCE | Attending: Emergency Medicine | Admitting: Emergency Medicine

## 2020-09-30 DIAGNOSIS — Z87442 Personal history of urinary calculi: Secondary | ICD-10-CM | POA: Insufficient documentation

## 2020-09-30 DIAGNOSIS — K219 Gastro-esophageal reflux disease without esophagitis: Secondary | ICD-10-CM | POA: Diagnosis not present

## 2020-09-30 DIAGNOSIS — Z8616 Personal history of COVID-19: Secondary | ICD-10-CM | POA: Insufficient documentation

## 2020-09-30 DIAGNOSIS — R109 Unspecified abdominal pain: Secondary | ICD-10-CM | POA: Diagnosis not present

## 2020-09-30 DIAGNOSIS — N200 Calculus of kidney: Secondary | ICD-10-CM

## 2020-09-30 LAB — URINALYSIS, ROUTINE W REFLEX MICROSCOPIC
Bilirubin Urine: NEGATIVE
Glucose, UA: NEGATIVE mg/dL
Hgb urine dipstick: NEGATIVE
Ketones, ur: NEGATIVE mg/dL
Leukocytes,Ua: NEGATIVE
Nitrite: NEGATIVE
Protein, ur: NEGATIVE mg/dL
Specific Gravity, Urine: 1.015 (ref 1.005–1.030)
pH: 6 (ref 5.0–8.0)

## 2020-09-30 LAB — CBC WITH DIFFERENTIAL/PLATELET
Abs Immature Granulocytes: 0.01 10*3/uL (ref 0.00–0.07)
Basophils Absolute: 0 10*3/uL (ref 0.0–0.1)
Basophils Relative: 0 %
Eosinophils Absolute: 0.1 10*3/uL (ref 0.0–0.5)
Eosinophils Relative: 2 %
HCT: 40.8 % (ref 39.0–52.0)
Hemoglobin: 14.1 g/dL (ref 13.0–17.0)
Immature Granulocytes: 0 %
Lymphocytes Relative: 20 %
Lymphs Abs: 1.1 10*3/uL (ref 0.7–4.0)
MCH: 33.4 pg (ref 26.0–34.0)
MCHC: 34.6 g/dL (ref 30.0–36.0)
MCV: 96.7 fL (ref 80.0–100.0)
Monocytes Absolute: 0.5 10*3/uL (ref 0.1–1.0)
Monocytes Relative: 10 %
Neutro Abs: 3.6 10*3/uL (ref 1.7–7.7)
Neutrophils Relative %: 68 %
Platelets: 269 10*3/uL (ref 150–400)
RBC: 4.22 MIL/uL (ref 4.22–5.81)
RDW: 11.4 % — ABNORMAL LOW (ref 11.5–15.5)
WBC: 5.3 10*3/uL (ref 4.0–10.5)
nRBC: 0 % (ref 0.0–0.2)

## 2020-09-30 LAB — BASIC METABOLIC PANEL
Anion gap: 8 (ref 5–15)
BUN: 19 mg/dL (ref 6–20)
CO2: 25 mmol/L (ref 22–32)
Calcium: 9.9 mg/dL (ref 8.9–10.3)
Chloride: 110 mmol/L (ref 98–111)
Creatinine, Ser: 1.07 mg/dL (ref 0.61–1.24)
GFR, Estimated: >60 mL/min (ref >60)
Glucose, Bld: 104 mg/dL — ABNORMAL HIGH (ref 70–99)
Potassium: 4 mmol/L (ref 3.5–5.1)
Sodium: 143 mmol/L (ref 135–145)

## 2020-09-30 MED ORDER — HYDROCODONE-ACETAMINOPHEN 5-325 MG PO TABS: 1.0000 | ORAL_TABLET | Freq: Four times a day (QID) | ORAL | 0 refills | Status: DC | PRN

## 2020-09-30 MED ORDER — DIPHENHYDRAMINE HCL 50 MG/ML IJ SOLN
25.0000 mg | Freq: Once | INTRAMUSCULAR | Status: AC
  Administered 2020-09-30: 25 mg via INTRAVENOUS
  Filled 2020-09-30: qty 1

## 2020-09-30 MED ORDER — HYDROMORPHONE HCL 1 MG/ML IJ SOLN
1.0000 mg | Freq: Once | INTRAMUSCULAR | Status: AC
  Administered 2020-09-30: 1 mg via INTRAVENOUS
  Filled 2020-09-30: qty 1

## 2020-09-30 MED ORDER — ONDANSETRON HCL 4 MG/2ML IJ SOLN
4.0000 mg | Freq: Once | INTRAMUSCULAR | Status: AC
  Administered 2020-09-30: 4 mg via INTRAVENOUS
  Filled 2020-09-30: qty 2

## 2020-09-30 MED ORDER — KETOROLAC TROMETHAMINE 30 MG/ML IJ SOLN
30.0000 mg | Freq: Once | INTRAMUSCULAR | Status: AC
  Administered 2020-09-30: 30 mg via INTRAVENOUS
  Filled 2020-09-30: qty 1

## 2020-09-30 NOTE — Discharge Instructions (Addendum)
1.  Continue ibuprofen and Flomax at home.  Take 1-2 Vicodin every 6 hours as needed for pain. 2.  Call your urologist tomorrow morning to get a follow-up soon as possible. 3.  Return to emergency department if you have fever, vomiting, pain not controlled by medications or other concerning symptoms.

## 2020-09-30 NOTE — ED Provider Notes (Signed)
[facility]-EMERGENCY DEPT Provider Note   CSN: [id number] Arrival date & time: 09/30/20  0507     History Chief Complaint  Patient presents with   Groin Pain    Devin Sanders is a 40 y.o. male.  Patient is a 40 year old male with past medical history of recurrent renal calculi, cystinuria.  Patient recently underwent lithotripsy by Dr. Urban Gibson in August for a large right-sided kidney stone.  He had a stent in place for a brief period of time.  He presents today with complaints of left flank pain.  This has been worsening over the past few days.  He reports passing 2 small stones.  He denies any fevers or chills.  He denies any burning with urination or blood in his urine.  Pain is similar to prior renal calculi.  The history is provided by the patient.  Groin Pain This is a new problem. The current episode started 2 days ago. The problem occurs constantly. The problem has been gradually worsening. Nothing aggravates the symptoms. Nothing relieves the symptoms. He has tried nothing for the symptoms.      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)   GERD (gastroesophageal reflux disease)    pt denied   History of kidney stones    Left ureteral calculus    Renal calculus, left    Wears glasses     Patient Active Problem List   Diagnosis Date Noted   Ureteral stone with hydronephrosis 08/08/2020   Hydronephrosis of right kidney 09/28/2017    Past Surgical History:  Procedure Laterality Date   CYSTOSCOPY WITH RETROGRADE PYELOGRAM, URETEROSCOPY AND STENT PLACEMENT Bilateral 06/26/2017   Procedure: CYSTOSCOPY WITH RETROGRADE PYELOGRAM, URETEROSCOPY, STONE BASKETRY  AND STENT PLACEMENT;  Surgeon: [name], MD;  Location[facility];  Service: Urology;  Laterality: Bilateral;   CYSTOSCOPY WITH RETROGRADE PYELOGRAM, URETEROSCOPY AND STENT  PLACEMENT Right 09/18/2017   Procedure: CYSTOSCOPY WITH RETROGRADE PYELOGRAM, URETEROSCOPY AND STENT PLACEMENT;  Surgeon: Crist Fat, MD;  Location: [facility];  Service: Urology;  Laterality: Right;   CYSTOSCOPY WITH RETROGRADE PYELOGRAM, URETEROSCOPY AND STENT PLACEMENT Bilateral 10/13/2017   Procedure: CYSTOSCOPY WITH RETROGRADE PYELOGRAM, BILATERAL URETEROSCOPY AND BILATERAL STENT PLACEMENT, LASER;  Surgeon: [name], MD;  Location: [facility];  Service: Urology;  Laterality: Bilateral;  90 MINS   CYSTOSCOPY WITH RETROGRADE PYELOGRAM, URETEROSCOPY AND STENT PLACEMENT Bilateral 03/24/2018   Procedure: CYSTOSCOPY WITH RETROGRADE PYELOGRAM, URETEROSCOPY AND STENT PLACEMENT;  Surgeon: [name], MD;  Location: [facility];  Service: Urology;  Laterality: Bilateral;  1 HR   CYSTOSCOPY WITH RETROGRADE PYELOGRAM, URETEROSCOPY AND STENT PLACEMENT Bilateral 07/07/2018   Procedure: CYSTOSCOPY WITH RETROGRADE PYELOGRAM, URETEROSCOPY AND STENT PLACEMENT;  Surgeon: [name], MD;  Location: [facility];  Service: Urology;  Laterality: Bilateral;  75 MINUTES   CYSTOSCOPY WITH RETROGRADE PYELOGRAM, URETEROSCOPY AND STENT PLACEMENT Left 01/12/2019   Procedure: CYSTOSCOPY WITH RETROGRADE PYELOGRAM, URETEROSCOPY AND STENT PLACEMENT;  Surgeon: [name], MD;  Location: [facility];  Service: Urology;  Laterality: Left;  90 MINS   CYSTOSCOPY WITH RETROGRADE PYELOGRAM, URETEROSCOPY AND STENT PLACEMENT Left 01/28/2019   Procedure: CYSTOSCOPY WITH RETROGRADE PYELOGRAM, URETEROSCOPY AND STENT EXCHANGE STONE BASKET EXTRACTION ;  Surgeon: [name], MD;  Location: [facility];  Service: Urology;  Laterality: Left;   CYSTOSCOPY WITH RETROGRADE PYELOGRAM, URETEROSCOPY AND STENT PLACEMENT Bilateral 05/13/2019  Procedure: CYSTOSCOPY WITH BILATERAL  RETROGRADE PYELOGRAM, LEFT URETEROSCOPY WITH HOLMIUM LASER AND STENT PLACEMENT;  Surgeon: [name], MD;  Location: [facility];  Service: Urology;   Laterality: Bilateral;   CYSTOSCOPY WITH RETROGRADE PYELOGRAM, URETEROSCOPY AND STENT PLACEMENT Bilateral 11/02/2019   Procedure: CYSTOSCOPY WITH RETROGRADE PYELOGRAM, URETEROSCOPY AND STENT PLACEMENT;  Surgeon: [name], MD;  Location: [facility];  Service: Urology;  Laterality: Bilateral;  90 MINS   CYSTOSCOPY/RETROGRADE/URETEROSCOPY/STONE EXTRACTION WITH BASKET Left 10/05/2017   Procedure: CYSTOSCOPY/RETROGRADE/STONE REMOVAL FROM BLADDER ;  Surgeon: Bjorn Pippin, MD;  Location: [facility];  Service: Urology;  Laterality: Left;   CYSTOSCOPY/URETEROSCOPY/HOLMIUM LASER/STENT PLACEMENT Right 09/28/2017   Procedure: CYSTOSCOPY/RIGHT RETROGRADE URETEROSCOPY/HOLMIUM LASER/ RIGHT STENT PLACEMENT;  Surgeon: Rene Paci, MD;  Location: [facility];  Service: Urology;  Laterality: Right;   CYSTOSCOPY/URETEROSCOPY/HOLMIUM LASER/STENT PLACEMENT Bilateral 01/27/2018   Procedure: CYSTOSCOPY/URETEROSCOPY/HOLMIUM LASER/STENT PLACEMENT;  Surgeon: [name], MD;  Location: [facility];  Service: Urology;  Laterality: Bilateral;   CYSTOSCOPY/URETEROSCOPY/HOLMIUM LASER/STENT PLACEMENT Right 08/08/2020   Procedure: CYSTOSCOPY/URETEROSCOPY/RETROGRADE/ HOLMIUM LASER/STENT PLACEMENT;  Surgeon: Marcine Matar, MD;  Location: [facility];  Service: Urology;  Laterality: Right;   HOLMIUM LASER APPLICATION Bilateral 06/26/2017   Procedure: HOLMIUM LASER APPLICATION;  Surgeon: [name], MD;  Location: [facility];  Service: Urology;  Laterality: Bilateral;   HOLMIUM LASER APPLICATION Right 09/18/2017   Procedure: HOLMIUM LASER APPLICATION;  Surgeon: Crist Fat, MD;  Location: [facility];  Service: Urology;  Laterality: Right;   HOLMIUM LASER APPLICATION Bilateral 10/13/2017   Procedure: HOLMIUM LASER APPLICATION;  Surgeon: [name], MD;  Location: [facility];  Service: Urology;  Laterality: Bilateral;   HOLMIUM LASER APPLICATION Bilateral 01/27/2018    Procedure: HOLMIUM LASER APPLICATION;  Surgeon: [name], MD;  Location: [facility];  Service: Urology;  Laterality: Bilateral;   HOLMIUM LASER APPLICATION Bilateral 03/24/2018   Procedure: HOLMIUM LASER APPLICATION;  Surgeon: [name], MD;  Location: [facility];  Service: Urology;  Laterality: Bilateral;   HOLMIUM LASER APPLICATION Bilateral 07/07/2018   Procedure: HOLMIUM LASER APPLICATION;  Surgeon: [name], MD;  Location: [facility];  Service: Urology;  Laterality: Bilateral;   HOLMIUM LASER APPLICATION Left 01/12/2019   Procedure: HOLMIUM LASER APPLICATION;  Surgeon: [name], MD;  Location: [facility];  Service: Urology;  Laterality: Left;   HOLMIUM LASER APPLICATION Bilateral 11/02/2019   Procedure: HOLMIUM LASER APPLICATION;  Surgeon: [name], MD;  Location: [facility];  Service: Urology;  Laterality: Bilateral;   PERCUTANEOUS NEPHROSTOLITHOTOMY  2003;  2009;  06-22-2014; 01-30-2015  @ [facility]   URETEROSCOPIC STONE MANIPULATION UNILATERAL  multiple since 1997--  last one 02-19-2017  @ [facility]       No family history on file.  Social History   Tobacco Use   Smoking status: Never   Smokeless tobacco: Never  Vaping Use   Vaping Use: Never used  Substance Use Topics   Alcohol use: Never   Drug use: Never    Home Medications Prior to Admission medications   Medication Sig Start Date End Date Taking? Authorizing Provider  HYDROcodone-acetaminophen (NORCO/VICODIN) 5-325 MG tablet Take 1 tablet by mouth every 6 (six) hours as needed. 08/13/20   Khatri, Hina, PA-C  melatonin 5 MG TABS Take 5 mg by mouth at bedtime as needed (sleep).    [provider]  oxybutynin (DITROPAN) 5 MG tablet Take 1 tablet (5 mg total) by mouth every 8 (eight) hours as needed for bladder spasms. 08/08/20  Marcine Matar, MD  tamsulosin (FLOMAX) 0.4 MG CAPS capsule Take 1 capsule (0.4 mg total) by mouth daily. 04/19/20   McDonald, Mia A, PA-C   traMADol (ULTRAM) 50 MG tablet Take 1 tablet (50 mg total) by mouth every 6 (six) hours as needed for moderate pain. 08/08/20 08/08/21  Marcine Matar, MD    Allergies    Morphine and Other  Review of Systems   Review of Systems  All other systems reviewed and are negative.  Physical Exam Updated Vital Signs BP (!) 152/104 (BP Location: Right Arm)   Pulse 95   Temp 98.5 F (36.9 C) (Oral)   Resp 18   Ht 5\' 9"  (1.753 m)   Wt 99.8 kg   SpO2 100%   BMI 32.49 kg/m   Physical Exam Vitals and nursing note reviewed.  Constitutional:      General: He is not in acute distress.    Appearance: He is well-developed. He is not diaphoretic.  HENT:     Head: Normocephalic and atraumatic.  Cardiovascular:     Rate and Rhythm: Normal rate and regular rhythm.     Heart sounds: No murmur heard.   No friction rub.  Pulmonary:     Effort: Pulmonary effort is normal. No respiratory distress.     Breath sounds: Normal breath sounds. No wheezing or rales.  Abdominal:     General: Bowel sounds are normal. There is no distension.     Palpations: Abdomen is soft.     Tenderness: There is no abdominal tenderness. There is left CVA tenderness. There is no right CVA tenderness or guarding.  Musculoskeletal:        General: Normal range of motion.     Cervical back: Normal range of motion and neck supple.  Skin:    General: Skin is warm and dry.  Neurological:     Mental Status: He is alert and oriented to person, place, and time.     Coordination: Coordination normal.    ED Results / Procedures / Treatments   Labs (all labs ordered are listed, but only abnormal results are displayed) Labs Reviewed  BASIC METABOLIC PANEL  CBC WITH DIFFERENTIAL/PLATELET    EKG None  Radiology No results found.  Procedures Procedures   Medications Ordered in ED Medications  ondansetron (ZOFRAN) injection 4 mg (has no administration in time range)  ketorolac (TORADOL) 30 MG/ML injection 30 mg  (has no administration in time range)  HYDROmorphone (DILAUDID) injection 1 mg (has no administration in time range)    ED Course  I have reviewed the triage vital signs and the nursing notes.  Pertinent labs & imaging results that were available during my care of the patient were reviewed by me and considered in my medical decision making (see chart for details).    MDM Rules/Calculators/A&P  Patient with history of recurrent renal calculi presenting with complaints of left flank pain.  He recently had lithotripsy on a large calculus on the right, and at the time was noted to have smaller, nonobstructing stones in the kidney on the left.  He presents today with left flank pain presumably related to renal calculi.  He shows me where he has passed 2 stones in the past 2 days and has these with him in a Ziploc bag.  Patient will be given medication for pain and see how he responds.  As the stones were small on previous CT scan, I do not feel as though reimaging is indicated.  Urinalysis  pending to rule out infection.  If negative, anticipate discharge.  Final Clinical Impression(s) / ED Diagnoses Final diagnoses:  None    Rx / DC Orders ED Discharge Orders     None        Geoffery Lyons, MD 09/30/20 [phone]

## 2020-09-30 NOTE — ED Provider Notes (Addendum)
Pain control for recurrent kidney stones.  Follow-up on urinalysis to rule out infection.  If pain controlled may be suitable for discharge. Physical Exam  BP (!) 136/100   Pulse 77   Temp 98.5 F (36.9 C) (Oral)   Resp 18   Ht 5\' 9"  (1.753 m)   Wt 99.8 kg   SpO2 93%   BMI 32.49 kg/m   Physical Exam  ED Course/Procedures     Procedures  MDM  Urinalysis is normal, renal function normal.  Patient reports history of multiple kidney stones.  He does continue to have some left lower quadrant and flank pain but it is tolerable at this point.  Patient feels comfortable to continue home treatment with ibuprofen Flomax and Vicodin.  He will call urology in the morning for follow-up.       , MD 09/30/20 10/02/20    [id number], MD 09/30/20 [phone]

## 2020-09-30 NOTE — ED Triage Notes (Signed)
Pt has L inguinal pain due to hx of kidney stones. Pt has had pain for two days. He passed two kidney stones at home, and he has them present. Pt states that the pain has gotten worse over the last few hours.

## 2020-10-04 ENCOUNTER — Other Ambulatory Visit: Payer: Self-pay | Admitting: Urology

## 2020-10-04 NOTE — Progress Notes (Signed)
Sent message, via epic in basket, requesting orders in epic from surgeon.  

## 2020-10-10 NOTE — Patient Instructions (Addendum)
DUE TO COVID-19 ONLY ONE VISITOR IS ALLOWED TO COME WITH YOU AND STAY IN THE WAITING ROOM ONLY DURING PRE OP AND PROCEDURE.   **NO VISITORS ARE ALLOWED IN THE SHORT STAY AREA OR RECOVERY ROOM!!**       Your procedure is scheduled on: 10/12/20   Report to [facility] Main Entrance   Report to Short Stay at 5:15 AM   [facility])   Call this number if you have problems the morning of surgery [phone]   Do not eat food :After Midnight.   May have liquids until 4:30 AM day of surgery  CLEAR LIQUID DIET  Foods Allowed                                                                     Foods Excluded  Water, Black Coffee and tea (no milk or creamer)           liquids that you cannot  Plain Jell-O in any flavor  (No red)                                   see through such as: Fruit ices (not with fruit pulp)                                           milk, soups, orange juice              Iced Popsicles (No red)                                               All solid food                                   Apple juices Sports drinks like Gatorade (No red) Lightly seasoned clear broth or consume(fat free) Sugar    Oral Hygiene is also important to reduce your risk of infection.                                    Remember - BRUSH YOUR TEETH THE MORNING OF SURGERY WITH YOUR REGULAR TOOTHPASTE   Take these medicines the morning of surgery with A SIP OF WATER: Norco                              You may not have any metal on your body including jewelry, and body piercing             Do not wear lotions, powders, cologne, or deodorant              Men may shave face and neck.   Do not bring valuables to the hospital. [facility] IS NOT  RESPONSIBLE   FOR VALUABLES.    Patients discharged on the day of surgery will not be allowed to drive home.  Please read over the following fact sheets you were given: IF YOU HAVE QUESTIONS ABOUT YOUR PRE-OP INSTRUCTIONS PLEASE  CALL [phone]- [facility] - Preparing for Surgery Before surgery, you can play an important role.  Because skin is not sterile, your skin needs to be as free of germs as possible.  You can reduce the number of germs on your skin by washing with CHG (chlorahexidine gluconate) soap before surgery.  CHG is an antiseptic cleaner which kills germs and bonds with the skin to continue killing germs even after washing. Please DO NOT use if you have an allergy to CHG or antibacterial soaps.  If your skin becomes reddened/irritated stop using the CHG and inform your nurse when you arrive at Short Stay. Do not shave (including legs and underarms) for at least 48 hours prior to the first CHG shower.  You may shave your face/neck.  Please follow these instructions carefully:  1.  Shower with CHG Soap the night before surgery and the  morning of surgery.  2.  If you choose to wash your hair, wash your hair first as usual with your normal  shampoo.  3.  After you shampoo, rinse your hair and body thoroughly to remove the shampoo.                             4.  Use CHG as you would any other liquid soap.  You can apply chg directly to the skin and wash.  Gently with a scrungie or clean washcloth.  5.  Apply the CHG Soap to your body ONLY FROM THE NECK DOWN.   Do   not use on face/ open                           Wound or open sores. Avoid contact with eyes, ears mouth and   genitals (private parts).                       Wash face,  Genitals (private parts) with your normal soap.             6.  Wash thoroughly, paying special attention to the area where your    surgery  will be performed.  7.  Thoroughly rinse your body with warm water from the neck down.  8.  DO NOT shower/wash with your normal soap after using and rinsing off the CHG Soap.                9.  Pat yourself dry with a clean towel.            10.  Wear clean pajamas.            11.  Place clean sheets on your bed the night of your  first shower and do not  sleep with pets. Day of Surgery : Do not apply any lotions/deodorants the morning of surgery.  Please wear clean clothes to the hospital/surgery center.  FAILURE TO FOLLOW THESE INSTRUCTIONS MAY RESULT IN THE CANCELLATION OF YOUR SURGERY  PATIENT SIGNATURE_________________________________  NURSE SIGNATURE__________________________________  ________________________________________________________________________

## 2020-10-10 NOTE — Progress Notes (Addendum)
COVID swab appointment: n/a  COVID Vaccine Completed: yes x2 Date COVID Vaccine completed: Has received booster: COVID vaccine manufacturer: Pfizer    [employer]   Date of COVID positive in last 90 days: no  PCP - [facility] Cardiologist - n/a  Chest x-ray - n/a EKG - n/a Stress Test - n/a ECHO - n/a Cardiac Cath -  Pacemaker/ICD device last checked: n/a Spinal Cord Stimulator:n/a  Sleep Study - n/a CPAP -   Fasting Blood Sugar - n/a Checks Blood Sugar _____ times a day  Blood Thinner Instructions: n/a Aspirin Instructions: Last Dose:  Activity level: Can go up a flight of stairs and perform activities of daily living without stopping and without symptoms of chest pain or shortness of breath.    Anesthesia review:   Patient denies shortness of breath, fever, cough and chest pain at PAT appointment  Pt BP at PAT 138/109. He reports having a lot of pain and that it is always high when he is pain. Discussed with PA Shanda Bumps. Want pt to check at home when not in pain. Pt will check BP  when not in pain and if still high will contact PCP.   Patient verbalized understanding of instructions that were given to them at the PAT appointment. Patient was also instructed that they will need to review over the PAT instructions again at home before surgery.

## 2020-10-11 ENCOUNTER — Encounter (HOSPITAL_COMMUNITY)
Admission: RE | Admit: 2020-10-11 | Discharge: 2020-10-11 | Disposition: A | Discharge status: Home / Self Care | Payer: No Typology Code available for payment source | Source: Ambulatory Visit | Attending: Urology | Admitting: Urology

## 2020-10-11 ENCOUNTER — Encounter (HOSPITAL_COMMUNITY): Payer: Self-pay

## 2020-10-11 ENCOUNTER — Other Ambulatory Visit: Payer: Self-pay

## 2020-10-11 DIAGNOSIS — Z01812 Encounter for preprocedural laboratory examination: Secondary | ICD-10-CM | POA: Diagnosis not present

## 2020-10-11 MED ORDER — GENTAMICIN SULFATE 40 MG/ML IJ SOLN
5.0000 mg/kg | INTRAVENOUS | Status: AC
  Administered 2020-10-12: 440 mg via INTRAVENOUS
  Filled 2020-10-11: qty 11

## 2020-10-11 NOTE — Anesthesia Preprocedure Evaluation (Addendum)
Anesthesia Evaluation  Patient identified by MRN, date of birth, ID band Patient awake    Reviewed: Allergy & Precautions, NPO status , Patient's Chart, lab work & pertinent test results  Airway Mallampati: II  TM Distance: >3 FB Neck ROM: Full    Dental no notable dental hx. (+) Dental Advisory Given, Teeth Intact   Pulmonary neg pulmonary ROS,    Pulmonary exam normal breath sounds clear to auscultation       Cardiovascular negative cardio ROS Normal cardiovascular exam Rhythm:Regular Rate:Normal     Neuro/Psych negative neurological ROS     GI/Hepatic Neg liver ROS, GERD  ,  Endo/Other  negative endocrine ROS  Renal/GU Renal disease     Musculoskeletal negative musculoskeletal ROS (+)   Abdominal (+) + obese,   Peds  Hematology negative hematology ROS (+)   Anesthesia Other Findings   Reproductive/Obstetrics                            Anesthesia Physical Anesthesia Plan  ASA: 2  Anesthesia Plan: General   Post-op Pain Management:    Induction: Intravenous  PONV Risk Score and Plan: 3 and Ondansetron, Dexamethasone, Treatment may vary due to age or medical condition and Midazolam  Airway Management Planned: LMA  Additional Equipment:   Intra-op Plan:   Post-operative Plan: Extubation in OR  Informed Consent: I have reviewed the patients History and Physical, chart, labs and discussed the procedure including the risks, benefits and alternatives for the proposed anesthesia with the patient or authorized representative who has indicated his/her understanding and acceptance.     Dental advisory given  Plan Discussed with: CRNA  Anesthesia Plan Comments:        Anesthesia Quick Evaluation

## 2020-10-12 ENCOUNTER — Ambulatory Visit (HOSPITAL_COMMUNITY): Payer: PRIVATE HEALTH INSURANCE | Admitting: Anesthesiology

## 2020-10-12 ENCOUNTER — Encounter (HOSPITAL_COMMUNITY): Payer: Self-pay | Admitting: Urology

## 2020-10-12 ENCOUNTER — Ambulatory Visit (HOSPITAL_COMMUNITY): Payer: PRIVATE HEALTH INSURANCE | Admitting: Physician Assistant

## 2020-10-12 ENCOUNTER — Encounter (HOSPITAL_COMMUNITY)
Admission: RE | Disposition: A | Discharge status: Home / Self Care | Payer: Self-pay | Source: Home / Self Care | Attending: Urology

## 2020-10-12 ENCOUNTER — Ambulatory Visit (HOSPITAL_COMMUNITY)
Admission: RE | Admit: 2020-10-12 | Discharge: 2020-10-12 | Disposition: A | Discharge status: Home / Self Care | Payer: PRIVATE HEALTH INSURANCE | Attending: Urology | Admitting: Urology

## 2020-10-12 ENCOUNTER — Ambulatory Visit (HOSPITAL_COMMUNITY): Payer: PRIVATE HEALTH INSURANCE

## 2020-10-12 DIAGNOSIS — N202 Calculus of kidney with calculus of ureter: Secondary | ICD-10-CM | POA: Insufficient documentation

## 2020-10-12 DIAGNOSIS — E7201 Cystinuria: Secondary | ICD-10-CM | POA: Insufficient documentation

## 2020-10-12 DIAGNOSIS — N200 Calculus of kidney: Secondary | ICD-10-CM | POA: Insufficient documentation

## 2020-10-12 DIAGNOSIS — Z791 Long term (current) use of non-steroidal anti-inflammatories (NSAID): Secondary | ICD-10-CM | POA: Insufficient documentation

## 2020-10-12 DIAGNOSIS — Z885 Allergy status to narcotic agent status: Secondary | ICD-10-CM | POA: Insufficient documentation

## 2020-10-12 DIAGNOSIS — Z87442 Personal history of urinary calculi: Secondary | ICD-10-CM | POA: Diagnosis not present

## 2020-10-12 DIAGNOSIS — Z8616 Personal history of COVID-19: Secondary | ICD-10-CM | POA: Insufficient documentation

## 2020-10-12 DIAGNOSIS — Z6834 Body mass index (BMI) 34.0-34.9, adult: Secondary | ICD-10-CM | POA: Diagnosis not present

## 2020-10-12 DIAGNOSIS — E669 Obesity, unspecified: Secondary | ICD-10-CM | POA: Insufficient documentation

## 2020-10-12 HISTORY — PX: CYSTOSCOPY WITH RETROGRADE PYELOGRAM, URETEROSCOPY AND STENT PLACEMENT: SHX5789

## 2020-10-12 SURGERY — CYSTOURETEROSCOPY, WITH RETROGRADE PYELOGRAM AND STENT INSERTION
Anesthesia: General | Site: Ureter | Laterality: Bilateral

## 2020-10-12 MED ORDER — FENTANYL CITRATE PF 50 MCG/ML IJ SOSY
25.0000 ug | PREFILLED_SYRINGE | INTRAMUSCULAR | Status: AC | PRN
  Administered 2020-10-12 (×3): 25 ug via INTRAVENOUS

## 2020-10-12 MED ORDER — ONDANSETRON HCL 4 MG/2ML IJ SOLN
INTRAMUSCULAR | Status: AC
  Filled 2020-10-12: qty 2

## 2020-10-12 MED ORDER — FENTANYL CITRATE (PF) 100 MCG/2ML IJ SOLN
INTRAMUSCULAR | Status: AC
  Filled 2020-10-12: qty 2

## 2020-10-12 MED ORDER — ORAL CARE MOUTH RINSE
15.0000 mL | Freq: Once | OROMUCOSAL | Status: AC
  Administered 2020-10-12: 15 mL via OROMUCOSAL

## 2020-10-12 MED ORDER — PROPOFOL 10 MG/ML IV BOLUS
INTRAVENOUS | Status: AC
  Filled 2020-10-12: qty 20

## 2020-10-12 MED ORDER — DEXAMETHASONE SODIUM PHOSPHATE 10 MG/ML IJ SOLN
INTRAMUSCULAR | Status: AC
  Filled 2020-10-12: qty 1

## 2020-10-12 MED ORDER — LIDOCAINE 2% (20 MG/ML) 5 ML SYRINGE
INTRAMUSCULAR | Status: DC | PRN
  Administered 2020-10-12: 80 mg via INTRAVENOUS

## 2020-10-12 MED ORDER — MIDAZOLAM HCL 2 MG/2ML IJ SOLN
INTRAMUSCULAR | Status: AC
  Filled 2020-10-12: qty 2

## 2020-10-12 MED ORDER — PROPOFOL 500 MG/50ML IV EMUL
INTRAVENOUS | Status: AC
  Filled 2020-10-12: qty 50

## 2020-10-12 MED ORDER — CHLORHEXIDINE GLUCONATE 0.12 % MT SOLN: 15.0000 mL | Freq: Once | OROMUCOSAL | Status: AC

## 2020-10-12 MED ORDER — OXYCODONE-ACETAMINOPHEN 5-325 MG PO TABS: 1.0000 | ORAL_TABLET | Freq: Four times a day (QID) | ORAL | 0 refills | Status: DC | PRN

## 2020-10-12 MED ORDER — FENTANYL CITRATE PF 50 MCG/ML IJ SOSY
PREFILLED_SYRINGE | INTRAMUSCULAR | Status: AC
  Administered 2020-10-12: 25 ug via INTRAVENOUS
  Filled 2020-10-12: qty 1

## 2020-10-12 MED ORDER — LACTATED RINGERS IV SOLN: INTRAVENOUS | Status: DC

## 2020-10-12 MED ORDER — LIDOCAINE HCL (PF) 2 % IJ SOLN
INTRAMUSCULAR | Status: AC
  Filled 2020-10-12: qty 5

## 2020-10-12 MED ORDER — PROMETHAZINE HCL 25 MG/ML IJ SOLN
INTRAMUSCULAR | Status: AC
  Filled 2020-10-12: qty 1

## 2020-10-12 MED ORDER — SODIUM CHLORIDE 0.9 % IR SOLN
Status: DC | PRN
  Administered 2020-10-12: 3000 mL via INTRAVESICAL

## 2020-10-12 MED ORDER — IOHEXOL 300 MG/ML  SOLN
INTRAMUSCULAR | Status: DC | PRN
  Administered 2020-10-12: 20 mL via URETHRAL

## 2020-10-12 MED ORDER — FENTANYL CITRATE PF 50 MCG/ML IJ SOSY
PREFILLED_SYRINGE | INTRAMUSCULAR | Status: AC
  Administered 2020-10-12: 50 ug via INTRAVENOUS
  Filled 2020-10-12: qty 1

## 2020-10-12 MED ORDER — CEPHALEXIN 500 MG PO CAPS: 500.0000 mg | ORAL_CAPSULE | Freq: Two times a day (BID) | ORAL | 0 refills | Status: AC

## 2020-10-12 MED ORDER — PROPOFOL 500 MG/50ML IV EMUL
INTRAVENOUS | Status: DC | PRN
Start: 2020-10-12 — End: 2020-10-12
  Administered 2020-10-12: 200 mg via INTRAVENOUS

## 2020-10-12 MED ORDER — DEXAMETHASONE SODIUM PHOSPHATE 10 MG/ML IJ SOLN
INTRAMUSCULAR | Status: DC | PRN
  Administered 2020-10-12: 10 mg via INTRAVENOUS

## 2020-10-12 MED ORDER — FENTANYL CITRATE (PF) 100 MCG/2ML IJ SOLN
INTRAMUSCULAR | Status: DC | PRN
  Administered 2020-10-12 (×2): 50 ug via INTRAVENOUS

## 2020-10-12 MED ORDER — CEFAZOLIN SODIUM-DEXTROSE 2-4 GM/100ML-% IV SOLN
2.0000 g | Freq: Once | INTRAVENOUS | Status: AC
  Administered 2020-10-12: 2 g via INTRAVENOUS
  Filled 2020-10-12: qty 100

## 2020-10-12 MED ORDER — PROMETHAZINE HCL 25 MG/ML IJ SOLN
6.2500 mg | INTRAMUSCULAR | Status: DC | PRN
  Administered 2020-10-12: 6.25 mg via INTRAVENOUS

## 2020-10-12 MED ORDER — MIDAZOLAM HCL 2 MG/2ML IJ SOLN
INTRAMUSCULAR | Status: DC | PRN
  Administered 2020-10-12: 2 mg via INTRAVENOUS

## 2020-10-12 MED ORDER — MEPERIDINE HCL 50 MG/ML IJ SOLN: 6.2500 mg | INTRAMUSCULAR | Status: DC | PRN

## 2020-10-12 MED ORDER — ONDANSETRON HCL 4 MG/2ML IJ SOLN
INTRAMUSCULAR | Status: DC | PRN
  Administered 2020-10-12: 4 mg via INTRAVENOUS

## 2020-10-12 SURGICAL SUPPLY — 24 items
BAG URO CATCHER STRL LF (MISCELLANEOUS) ×3 IMPLANT
BASKET LASER NITINOL 1.9FR (BASKET) ×2 IMPLANT
BSKT STON RTRVL 120 1.9FR (BASKET) ×2
CATH INTERMIT  6FR 70CM (CATHETERS) ×3 IMPLANT
CLOTH BEACON ORANGE TIMEOUT ST (SAFETY) ×3 IMPLANT
EXTRACTOR STONE 1.7FRX115CM (UROLOGICAL SUPPLIES) IMPLANT
GLOVE SURG ENC TEXT LTX SZ7.5 (GLOVE) ×3 IMPLANT
GOWN STRL REUS W/ TWL LRG LVL3 (GOWN DISPOSABLE) ×3 IMPLANT
GOWN STRL REUS W/TWL LRG LVL3 (GOWN DISPOSABLE) ×9
GUIDEWIRE ANG ZIPWIRE 038X150 (WIRE) ×5 IMPLANT
GUIDEWIRE STR DUAL SENSOR (WIRE) ×5 IMPLANT
KIT TURNOVER KIT A (KITS) ×3 IMPLANT
LASER FIB FLEXIVA PULSE ID 365 (Laser) IMPLANT
LASER FIB FLEXIVA PULSE ID 550 (Laser) IMPLANT
LASER FIB FLEXIVA PULSE ID 910 (Laser) IMPLANT
MANIFOLD NEPTUNE II (INSTRUMENTS) ×3 IMPLANT
PACK CYSTO (CUSTOM PROCEDURE TRAY) ×3 IMPLANT
SHEATH URETERAL 12FRX28CM (UROLOGICAL SUPPLIES) IMPLANT
SHEATH URETERAL 12FRX35CM (MISCELLANEOUS) ×2 IMPLANT
TRACTIP FLEXIVA PULS ID 200XHI (Laser) IMPLANT
TRACTIP FLEXIVA PULSE ID 200 (Laser)
TUBE FEEDING 8FR 16IN STR KANG (MISCELLANEOUS) ×3 IMPLANT
TUBING CONNECTING 10 (TUBING) ×3 IMPLANT
TUBING UROLOGY SET (TUBING) ×3 IMPLANT

## 2020-10-12 NOTE — Op Note (Signed)
NAME: Devin Sanders, MUNTER MEDICAL RECORD NO: [id number] ACCOUNT NO: [id number] DATE OF BIRTH: Jul 04, 1980 FACILITY: [facility] LOCATION: [facility] PHYSICIAN: [name], MD  Operative Report   DATE OF PROCEDURE: 10/12/2020  PREOPERATIVE DIAGNOSES:  Cystinuria, recurrent ureteral stone.  PROCEDURE PERFORMED:   1.  Cystoscopy with bilateral retrograde pyelograms interpretation. 2.  Bilateral ureteroscopy with basketing of stone. 3.  Insertion of bilateral ureteral stents, 5 x 24 Polaris with tether.  ESTIMATED BLOOD LOSS:  Nil.  COMPLICATIONS:  None.  SPECIMENS:  Bilateral renal and ureteral stone fragments for discard.  FINDINGS: 1.  Right greater than left renal and ureteral stone fragments. Total volume approximately 8 mm2. 2.  Complete resolution of all accessible stone fragments larger than one-third mm following basket extraction bilaterally. 3.  Successful placement of bilateral ureteral stents, proximal end in renal pelvis, distal end in urinary bladder.  INDICATIONS:  The patient is a very pleasant but unfortunate 40 year old man with history of cystinuria.  He is very compliant with medical therapy and behavioral changes.  Despite this, he does have fairly rapid recurrent stone disease, requiring  procedural intervention, usually one to two times per year. He began having his usual prodrome of colicky flank pain several weeks ago. He was on alpha blockers and pain meds; however, his pain has persisted.  Options were discussed for management  including continued medical therapy versus proceeding with bilateral ureteroscopic cleanout as he has done well with many times before and he wished to proceed.  Informed consent was obtained and placed in medical record.  DESCRIPTION OF PROCEDURE:  The patient being verified, procedure being bilateral ureteroscopic stone manipulation was confirmed.  Procedure timeout was performed.  Intravenous antibiotics were administered.  General  anesthesia was induced.  The patient  was placed into a low lithotomy position, sterile field was created, prepping and draping the patient's penis, perineum and proximal thigh using iodine.  Cystourethroscopy was performed using 21-French rigid scope with offset lens.  Inspection of the  anterior and posterior urethra is unremarkable.  Inspection of the urinary bladder revealed no diverticula, calcifications or papillary lesions.  The left ureteral orifice was cannulated with a 6-French end-hole catheter and a left retrograde pyelogram  was obtained.  Left retrograde pyelogram demonstrated single left ureter, single system left kidney.  No obvious hydronephrosis or filling defects were noted.  Next, right retrograde pyelogram was obtained.  Right retrograde pyelogram demonstrated single right ureter,  single system right kidney.  Again, no obvious hydronephrosis or large filling defects were noted.  The ZIPwires were advanced up bilateral kidneys acting as externalized safety wires. An 8-French feeding tube placed in the urinary bladder for pressure  release.  Semi-rigid ureteroscopy was performed of the distal four-fifths of the right ureter alongside a separate sensor working wire.  No mucosal abnormalities were found.  Next, semirigid ureteroscopy was performed of the distal four-fifths of the  left ureter alongside a separate sensor working wire.  No mucosal abnormalities were found. Semirigid scope was then exchanged for an 11/13 medium length ureteral access sheath to the level of proximal ureter, using continuous fluoroscopic guidance and  flexible digital ureteroscopy was performed of proximal left ureter and systematic inspection of left kidney including all calices x3.  There was multifocal small volume papillary tip calcifications, most of these were just 2-3 mm.  These were amenable  to simple basketing with the Escape basket, removed and set aside for discard.  Access sheath was removed under  continuous vision.  No significant  mucosal abnormalities were found.  Next, the access sheath was placed over the right sensor working wire at  the level of the proximal right ureter and flexible digital ureteroscopy was performed of the proximal right ureter and systematic inspection of the right kidney including all calices x3.  There was multifocal, somewhat larger volume stones on the  right, a dominant stone approximately 6 mm in the upper mid calix.  This was very elongated in its geometry and did appear amenable to simple basketing.  It was basketed with the Escape basket and removed on its long axis and again set aside for discard.  Additional calcifications were quite small at less than a millimeter and flushed through with irrigation via the sheath.  The access sheath was removed under continuous vision.  No significant mucosal abnormalities were found.  We achieved the goals of  surgery today, rendering the patient stone free clinically.  Given the bilateral nature of procedure, decision was made to place temporary stents and 5 x 24 Polaris type stents was placed over remaining safety wires bilaterally using fluoroscopic  guidance.  Good proximal and distal plane were noted.  Tether was left in place, fashioned to the dorsum of penis.  The procedure was terminated.  The patient tolerated the procedure well.  No immediate perioperative complications.  The patient was taken  to postanesthesia care in stable condition.  Plan for discharge home.   SHW D: 10/12/2020 8:34:04 am T: 10/12/2020 9:16:00 am  JOB: [id number]/ [id number]

## 2020-10-12 NOTE — Anesthesia Procedure Notes (Signed)
Procedure Name: LMA Insertion Date/Time: 10/12/2020 7:42 AM Performed by: Vanessa Tye, CRNA Pre-anesthesia Checklist: Emergency Drugs available, Patient identified, Suction available and Patient being monitored Patient Re-evaluated:Patient Re-evaluated prior to induction Oxygen Delivery Method: Circle system utilized Preoxygenation: Pre-oxygenation with 100% oxygen Induction Type: IV induction Ventilation: Mask ventilation without difficulty LMA: LMA with gastric port inserted LMA Size: 4.0 Number of attempts: 1 Placement Confirmation: positive ETCO2 and breath sounds checked- equal and bilateral Tube secured with: Tape Dental Injury: Teeth and Oropharynx as per pre-operative assessment

## 2020-10-12 NOTE — Brief Op Note (Signed)
10/12/2020  8:27 AM  PATIENT:  Devin Sanders  40 y.o. male  PRE-OPERATIVE DIAGNOSIS:  RECURRENT UROLITHIASIS  POST-OPERATIVE DIAGNOSIS:  RECURRENT UROLITHIASIS  PROCEDURE:  Procedure(s) with comments: CYSTOSCOPY WITH RETROGRADE PYELOGRAM, URETEROSCOPY, BASKETING OF STONES AND BILATERAL STENT PLACEMENTS (Bilateral) - 75 MINS  SURGEON:  Surgeon(s) and Role:    * [name], MD - Primary  PHYSICIAN ASSISTANT:   ASSISTANTS: none   ANESTHESIA:   general  EBL:  0 mL   BLOOD ADMINISTERED:none  DRAINS: none   LOCAL MEDICATIONS USED:  NONE  SPECIMEN:  Source of Specimen:  bilateral renal / ureteral stone fragments  DISPOSITION OF SPECIMEN:   discard  COUNTS:  YES  TOURNIQUET:  * No tourniquets in log *  DICTATION: .Other Dictation: Dictation Number [id number]  PLAN OF CARE: Discharge to home after PACU  PATIENT DISPOSITION:  PACU - hemodynamically stable.   Delay start of Pharmacological VTE agent (>24hrs) due to surgical blood loss or risk of bleeding: yes

## 2020-10-12 NOTE — Anesthesia Postprocedure Evaluation (Signed)
Anesthesia Post Note  Patient: Devin Sanders  Procedure(s) Performed: CYSTOSCOPY WITH RETROGRADE PYELOGRAM, URETEROSCOPY, BASKETING OF STONES AND BILATERAL STENT PLACEMENTS (Bilateral: Ureter)     Patient location during evaluation: PACU Anesthesia Type: General Level of consciousness: sedated and patient cooperative Pain management: pain level controlled Vital Signs Assessment: post-procedure vital signs reviewed and stable Respiratory status: spontaneous breathing Cardiovascular status: stable Anesthetic complications: no   No notable events documented.  Last Vitals:  Vitals:   10/12/20 1000 10/12/20 1015  BP: 130/80 135/80  Pulse: 86 84  Resp: 18 18  Temp:  36.6 C  SpO2: 96% 97%    Last Pain:  Vitals:   10/12/20 1015  TempSrc:   PainSc: 3                  Lewie Loron

## 2020-10-12 NOTE — Transfer of Care (Signed)
Immediate Anesthesia Transfer of Care Note  Patient: Devin Sanders  Procedure(s) Performed: CYSTOSCOPY WITH RETROGRADE PYELOGRAM, URETEROSCOPY, BASKETING OF STONES AND BILATERAL STENT PLACEMENTS (Bilateral: Ureter)  Patient Location: PACU  Anesthesia Type:General  Level of Consciousness: drowsy  Airway & Oxygen Therapy: Patient Spontanous Breathing and Patient connected to face mask  Post-op Assessment: Report given to RN and Post -op Vital signs reviewed and stable  Post vital signs: Reviewed and stable  Last Vitals:  Vitals Value Taken Time  BP 125/86 10/12/20 0833  Temp    Pulse 79 10/12/20 0834  Resp 9 10/12/20 0834  SpO2 100 % 10/12/20 0834  Vitals shown include unvalidated device data.  Last Pain:  Vitals:   10/12/20 0533  TempSrc: Oral         Complications: No notable events documented.

## 2020-10-12 NOTE — Discharge Instructions (Addendum)
1 - You may have urinary urgency (bladder spasms) and bloody urine on / off with stent in place. This is normal.  2 - Remove tethered stents on Monday morning at home by pulling on strings, then blue-white plastic tubing, and discarding. Office is open Monday if any problems arise. There are TWO stents.   3 - Call MD or go to ER for fever >102, severe pain / nausea / vomiting not relieved by medications, or acute change in medical status

## 2020-10-12 NOTE — H&P (Signed)
Devin Sanders is an 40 y.o. male.    Chief Complaint: Pre-OP BILATERAL Ureteroscopic Stone Manipulation  HPI:   1 - Recurrent Nephrolithiasis -  Pre 2019 - PCNL several each side, URS x innumerable each side at [facility].  02/2017 - left URS for 1.5cm stone to stone reduction (not stone free) at [facility] per OR notes  09/2017 - Rt ureteroscopy / Lt medical passage Berneice Heinrich / Annabell Howells); 10/2017 - bilateral URS to stone free  01/2018 - bilateral URS to stone free  01/2019 -bilateral stage urs to stone free; 05/2019 - bilateral URS to stone free; 09/2019 [place] - 1cm RUP, 1cm RLP, 1.4cm LUP no hydro  08/2020 - Rt URS for RLP 1cm, Rt mid 17mm, punctate Lt renal not adressed.   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   PMH sig for mild obesity. 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 His PCP is [employer] MD.   Today " Sherard " is seen to proceed with BILATERAL ureteroscopy for recurrent stones. NO interval fevers.    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)   GERD (gastroesophageal reflux disease)    pt denied   History of kidney stones    Left ureteral calculus    Renal calculus, left    Wears glasses     Past Surgical History:  Procedure Laterality Date   CYSTOSCOPY WITH RETROGRADE PYELOGRAM, URETEROSCOPY AND STENT PLACEMENT Bilateral 06/26/2017   Procedure: CYSTOSCOPY WITH RETROGRADE PYELOGRAM, URETEROSCOPY, STONE BASKETRY  AND STENT PLACEMENT;  Surgeon: 06/28/2017, MD;  Location: [facility];  Service: Urology;  Laterality: Bilateral;   CYSTOSCOPY WITH RETROGRADE PYELOGRAM, URETEROSCOPY AND STENT PLACEMENT Right 09/18/2017   Procedure: CYSTOSCOPY WITH RETROGRADE PYELOGRAM, URETEROSCOPY AND STENT PLACEMENT;  Surgeon: 09/20/2017, MD;  [facility];  Service: Urology;   Laterality: Right;   CYSTOSCOPY WITH RETROGRADE PYELOGRAM, URETEROSCOPY AND STENT PLACEMENT Bilateral 10/13/2017   Procedure: CYSTOSCOPY WITH RETROGRADE PYELOGRAM, BILATERAL URETEROSCOPY AND BILATERAL STENT PLACEMENT, LASER;  Surgeon: 12/13/2017, MD;  [facility];  Service: Urology;  Laterality: Bilateral;  90 MINS   CYSTOSCOPY WITH RETROGRADE PYELOGRAM, URETEROSCOPY AND STENT PLACEMENT Bilateral 03/24/2018   Procedure: CYSTOSCOPY WITH RETROGRADE PYELOGRAM, URETEROSCOPY AND STENT PLACEMENT;  Surgeon: 03/26/2018, MD;  [facility];  Service: Urology;  Laterality: Bilateral;  1 HR   CYSTOSCOPY WITH RETROGRADE PYELOGRAM, URETEROSCOPY AND STENT PLACEMENT Bilateral 07/07/2018   Procedure: CYSTOSCOPY WITH RETROGRADE PYELOGRAM, URETEROSCOPY AND STENT PLACEMENT;  Surgeon: 09/07/2018, MD;  [facility];  Service: Urology;  Laterality: Bilateral;  75 MINUTES   CYSTOSCOPY WITH RETROGRADE PYELOGRAM, URETEROSCOPY AND STENT PLACEMENT Left 01/12/2019   Procedure: CYSTOSCOPY WITH RETROGRADE PYELOGRAM, URETEROSCOPY AND STENT PLACEMENT;  Surgeon: 03/12/2019, MD;  [facility];  Service: Urology;  Laterality: Left;  90 MINS   CYSTOSCOPY WITH RETROGRADE PYELOGRAM, URETEROSCOPY AND STENT PLACEMENT Left 01/28/2019   Procedure: CYSTOSCOPY WITH RETROGRADE PYELOGRAM, URETEROSCOPY AND STENT EXCHANGE STONE BASKET EXTRACTION ;  Surgeon: 01/30/2019, MD;  Location: [facility];  Service: Urology;  Laterality: Left;   CYSTOSCOPY WITH RETROGRADE PYELOGRAM, URETEROSCOPY AND STENT PLACEMENT Bilateral 05/13/2019   Procedure: CYSTOSCOPY WITH BILATERAL  RETROGRADE PYELOGRAM, LEFT URETEROSCOPY WITH HOLMIUM LASER AND STENT PLACEMENT;  Surgeon: 07/13/2019, MD;  [name];  Service: Urology;  Laterality: Bilateral;   CYSTOSCOPY WITH RETROGRADE PYELOGRAM, URETEROSCOPY AND STENT PLACEMENT Bilateral 11/02/2019   Procedure: CYSTOSCOPY WITH RETROGRADE PYELOGRAM, URETEROSCOPY AND STENT  PLACEMENT;  Surgeon: [name], MD;  Location: [facility];  Service: Urology;  Laterality: Bilateral;  90 MINS   CYSTOSCOPY/RETROGRADE/URETEROSCOPY/STONE EXTRACTION WITH BASKET Left 10/05/2017   Procedure: CYSTOSCOPY/RETROGRADE/STONE REMOVAL FROM BLADDER ;  Surgeon: Bjorn Pippin, MD;  [facility];  Service: Urology;  Laterality: Left;   CYSTOSCOPY/URETEROSCOPY/HOLMIUM LASER/STENT PLACEMENT Right 09/28/2017   Procedure: CYSTOSCOPY/RIGHT RETROGRADE URETEROSCOPY/HOLMIUM LASER/ RIGHT STENT PLACEMENT;  Surgeon: Rene Paci, MD;  [facility];  Service: Urology;  Laterality: Right;   CYSTOSCOPY/URETEROSCOPY/HOLMIUM LASER/STENT PLACEMENT Bilateral 01/27/2018   Procedure: CYSTOSCOPY/URETEROSCOPY/HOLMIUM LASER/STENT PLACEMENT;  Surgeon: [name], MD;  Location: [facility];  Service: Urology;  Laterality: Bilateral;   CYSTOSCOPY/URETEROSCOPY/HOLMIUM LASER/STENT PLACEMENT Right 08/08/2020   Procedure: CYSTOSCOPY/URETEROSCOPY/RETROGRADE/ HOLMIUM LASER/STENT PLACEMENT;  Surgeon: Marcine Matar, MD;  [facility];  Service: Urology;  Laterality: Right;   HOLMIUM LASER APPLICATION Bilateral 06/26/2017   Procedure: HOLMIUM LASER APPLICATION;  Surgeon: [name], MD;  Location: [facility];  Service: Urology;  Laterality: Bilateral;   HOLMIUM LASER APPLICATION Right 09/18/2017   Procedure: HOLMIUM LASER APPLICATION;  Surgeon: Crist Fat, MD;  [facility];  Service: Urology;  Laterality: Right;   HOLMIUM LASER APPLICATION Bilateral 10/13/2017   Procedure: HOLMIUM LASER APPLICATION;  Surgeon: [name], MD;  [facility];  Service: Urology;  Laterality: Bilateral;   HOLMIUM LASER APPLICATION Bilateral 01/27/2018   Procedure: HOLMIUM LASER APPLICATION;  Surgeon: [name], MD;  Location: [facility];  Service: Urology;  Laterality: Bilateral;   HOLMIUM LASER APPLICATION Bilateral 03/24/2018    Procedure: HOLMIUM LASER APPLICATION;  Surgeon: [name], MD;  [facility];  Service: Urology;  Laterality: Bilateral;   HOLMIUM LASER APPLICATION Bilateral 07/07/2018   Procedure: HOLMIUM LASER APPLICATION;  Surgeon: [name], MD;  [facility];  Service: Urology;  Laterality: Bilateral;   HOLMIUM LASER APPLICATION Left 01/12/2019   Procedure: HOLMIUM LASER APPLICATION;  Surgeon: [name], MD;  [facility];  Service: Urology;  Laterality: Left;   HOLMIUM LASER APPLICATION Bilateral 11/02/2019   Procedure: HOLMIUM LASER APPLICATION;  Surgeon: [name], MD;  Location: [facility];  Service: Urology;  Laterality: Bilateral;   PERCUTANEOUS NEPHROSTOLITHOTOMY  2003;  2009;  06-22-2014; 01-30-2015  @ [facility]   URETEROSCOPIC STONE MANIPULATION UNILATERAL  multiple since 1997--  last one 02-19-2017  @ [facility]    History reviewed. No pertinent family history. Social History:  reports that he has never smoked. He has never used smokeless tobacco. He reports that he does not drink alcohol and does not use drugs.  Allergies:  Allergies  Allergen Reactions   Morphine Nausea And Vomiting   Other Hives and Swelling    Mangos    Medications Prior to Admission  Medication Sig Dispense Refill   HYDROcodone-acetaminophen (NORCO/VICODIN) 5-325 MG tablet Take 1-2 tablets by mouth every 6 (six) hours as needed for moderate pain or severe pain. 20 tablet 0   HYDROcodone-acetaminophen (NORCO/VICODIN) 5-325 MG tablet Take 1 tablet by mouth every 6 (six) hours as needed. (Patient not taking: Reported on 09/30/2020) 6 tablet 0   ibuprofen (ADVIL) 200 MG tablet Take 600 mg by mouth every 6 (six) hours as needed for mild pain.     melatonin 5 MG TABS Take 5 mg by mouth at bedtime as  needed (sleep). (Patient not taking: Reported on 09/30/2020)     oxybutynin (DITROPAN) 5 MG tablet Take 1 tablet (5 mg total) by mouth every 8 (eight) hours as needed for bladder spasms.  (Patient not taking: Reported on 09/30/2020) 30 tablet 1   tamsulosin (FLOMAX) 0.4 MG CAPS capsule Take 1 capsule (0.4 mg total) by mouth daily. (Patient not taking: Reported on 09/30/2020) 30 capsule 0   traMADol (ULTRAM) 50 MG tablet Take 1 tablet (50 mg total) by mouth every 6 (six) hours as needed for moderate pain. (Patient not taking: Reported on 09/30/2020) 15 tablet 0    No results found for this or any previous visit (from the past 48 hour(s)). No results found.  Review of Systems  Constitutional:  Negative for chills and fever.  Genitourinary:  Positive for flank pain.  All other systems reviewed and are negative.  Blood pressure (!) 147/102, pulse 87, temperature 98.1 F (36.7 C), temperature source Oral, resp. rate 16, SpO2 96 %. Physical Exam Vitals reviewed.  HENT:     Head: Normocephalic.     Nose: Nose normal.     Mouth/Throat:     Mouth: Mucous membranes are moist.  Pulmonary:     Effort: Pulmonary effort is normal.  Abdominal:     General: Abdomen is flat.  Genitourinary:    Comments: Minmal CVAT at present Musculoskeletal:     Cervical back: Normal range of motion.  Skin:    General: Skin is warm.  Neurological:     General: No focal deficit present.     Mental Status: He is alert.  Psychiatric:        Mood and Affect: Mood normal.     Assessment/Plan  Proceed as planned with BILATERAL ureteroscopic stone manipulation. Risks, benefits, alternatives, expected peri-op course discussed previosly and reiterated today.   [name], MD 10/12/2020, 7:00 AM

## 2020-10-13 ENCOUNTER — Emergency Department (HOSPITAL_COMMUNITY): Payer: PRIVATE HEALTH INSURANCE

## 2020-10-13 ENCOUNTER — Emergency Department (HOSPITAL_COMMUNITY)
Admission: EM | Admit: 2020-10-13 | Discharge: 2020-10-14 | Disposition: A | Discharge status: Home / Self Care | Payer: PRIVATE HEALTH INSURANCE | Source: Home / Self Care | Attending: Emergency Medicine | Admitting: Emergency Medicine

## 2020-10-13 ENCOUNTER — Encounter (HOSPITAL_COMMUNITY): Payer: Self-pay | Admitting: Urology

## 2020-10-13 ENCOUNTER — Other Ambulatory Visit: Payer: Self-pay

## 2020-10-13 DIAGNOSIS — R310 Gross hematuria: Secondary | ICD-10-CM

## 2020-10-13 DIAGNOSIS — R112 Nausea with vomiting, unspecified: Secondary | ICD-10-CM | POA: Insufficient documentation

## 2020-10-13 DIAGNOSIS — N23 Unspecified renal colic: Secondary | ICD-10-CM | POA: Diagnosis not present

## 2020-10-13 DIAGNOSIS — R1030 Lower abdominal pain, unspecified: Secondary | ICD-10-CM

## 2020-10-13 DIAGNOSIS — Z8616 Personal history of COVID-19: Secondary | ICD-10-CM | POA: Insufficient documentation

## 2020-10-13 DIAGNOSIS — N9989 Other postprocedural complications and disorders of genitourinary system: Secondary | ICD-10-CM | POA: Diagnosis not present

## 2020-10-13 LAB — URINALYSIS, ROUTINE W REFLEX MICROSCOPIC
Bacteria, UA: NONE SEEN
Cystine Crys, UA: POSITIVE
RBC / HPF: >50 RBC/hpf — ABNORMAL HIGH (ref 0–5)

## 2020-10-13 MED ORDER — OXYCODONE-ACETAMINOPHEN 5-325 MG PO TABS
1.0000 | ORAL_TABLET | Freq: Once | ORAL | Status: AC
  Administered 2020-10-13: 1 via ORAL
  Filled 2020-10-13: qty 1

## 2020-10-13 MED ORDER — KETOROLAC TROMETHAMINE 30 MG/ML IJ SOLN
30.0000 mg | Freq: Once | INTRAMUSCULAR | Status: AC
  Administered 2020-10-14: 30 mg via INTRAVENOUS
  Filled 2020-10-13: qty 1

## 2020-10-13 MED ORDER — ONDANSETRON HCL 4 MG/2ML IJ SOLN
4.0000 mg | Freq: Once | INTRAMUSCULAR | Status: AC
  Administered 2020-10-14: 4 mg via INTRAVENOUS
  Filled 2020-10-13: qty 2

## 2020-10-13 NOTE — ED Provider Notes (Signed)
[facility]-EMERGENCY DEPT Provider Note   CSN: [id number] Arrival date & time: 10/13/20  1656     History Chief Complaint  Patient presents with   Post-op Problem   Hematuria    Devin Sanders is a 40 y.o. male.  Patient presents to the ED with a chief complaint of flank pain.  He states that he had ureteral stents placed yesterday by Dr. Berneice Heinrich.  States that today he is having significant pain that has worsened since yesterday.  He states that he is having blood clots when he urinates.  He rates his pain as severe.  He has had nausea and vomiting because of the pain.  He has been taking his home meds including percocet, and keflex.  The history is provided by the patient. No language interpreter was used.      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)   GERD (gastroesophageal reflux disease)    pt denied   History of kidney stones    Left ureteral calculus    Renal calculus, left    Wears glasses     Patient Active Problem List   Diagnosis Date Noted   Ureteral stone with hydronephrosis 08/08/2020   Hydronephrosis of right kidney 09/28/2017    Past Surgical History:  Procedure Laterality Date   CYSTOSCOPY WITH RETROGRADE PYELOGRAM, URETEROSCOPY AND STENT PLACEMENT Bilateral 06/26/2017   Procedure: CYSTOSCOPY WITH RETROGRADE PYELOGRAM, URETEROSCOPY, STONE BASKETRY  AND STENT PLACEMENT;  Surgeon: [name], MD;  Location: [facility];  Service: Urology;  Laterality: Bilateral;   CYSTOSCOPY WITH RETROGRADE PYELOGRAM, URETEROSCOPY AND STENT PLACEMENT Right 09/18/2017   Procedure: CYSTOSCOPY WITH RETROGRADE PYELOGRAM, URETEROSCOPY AND STENT PLACEMENT;  Surgeon: Crist Fat, MD;  [facility];  Service: Urology;  Laterality: Right;   CYSTOSCOPY WITH RETROGRADE PYELOGRAM, URETEROSCOPY AND STENT PLACEMENT Bilateral 10/13/2017    Procedure: CYSTOSCOPY WITH RETROGRADE PYELOGRAM, BILATERAL URETEROSCOPY AND BILATERAL STENT PLACEMENT, LASER;  Surgeon: [name], MD;  [facility];  Service: Urology;  Laterality: Bilateral;  90 MINS   CYSTOSCOPY WITH RETROGRADE PYELOGRAM, URETEROSCOPY AND STENT PLACEMENT Bilateral 03/24/2018   Procedure: CYSTOSCOPY WITH RETROGRADE PYELOGRAM, URETEROSCOPY AND STENT PLACEMENT;  Surgeon: [name], MD;  [facility];  Service: Urology;  Laterality: Bilateral;  1 HR   CYSTOSCOPY WITH RETROGRADE PYELOGRAM, URETEROSCOPY AND STENT PLACEMENT Bilateral 07/07/2018   Procedure: CYSTOSCOPY WITH RETROGRADE PYELOGRAM, URETEROSCOPY AND STENT PLACEMENT;  Surgeon: [name], MD;  [facility];  Service: Urology;  Laterality: Bilateral;  75 MINUTES   CYSTOSCOPY WITH RETROGRADE PYELOGRAM, URETEROSCOPY AND STENT PLACEMENT Left 01/12/2019   Procedure: CYSTOSCOPY WITH RETROGRADE PYELOGRAM, URETEROSCOPY AND STENT PLACEMENT;  Surgeon: [name], MD;  [facility];  Service: Urology;  Laterality: Left;  90 MINS   CYSTOSCOPY WITH RETROGRADE PYELOGRAM, URETEROSCOPY AND STENT PLACEMENT Left 01/28/2019   Procedure: CYSTOSCOPY WITH RETROGRADE PYELOGRAM, URETEROSCOPY AND STENT EXCHANGE STONE BASKET EXTRACTION ;  Surgeon: [name], MD;  Location: [facility];  Service: Urology;  Laterality: Left;   CYSTOSCOPY WITH RETROGRADE PYELOGRAM, URETEROSCOPY AND STENT PLACEMENT Bilateral 05/13/2019   Procedure: CYSTOSCOPY WITH BILATERAL  RETROGRADE PYELOGRAM, LEFT URETEROSCOPY WITH HOLMIUM LASER AND STENT PLACEMENT;  Surgeon: [name], MD;  [facility];  Service: Urology;  Laterality: Bilateral;   CYSTOSCOPY WITH RETROGRADE PYELOGRAM, URETEROSCOPY AND STENT PLACEMENT Bilateral 11/02/2019   Procedure: CYSTOSCOPY WITH  RETROGRADE PYELOGRAM, URETEROSCOPY AND STENT PLACEMENT;  Surgeon: [name], MD;  Location: [facility];  Service: Urology;  Laterality:  Bilateral;  90 MINS   CYSTOSCOPY WITH RETROGRADE PYELOGRAM, URETEROSCOPY AND STENT PLACEMENT Bilateral 10/12/2020   Procedure: CYSTOSCOPY WITH RETROGRADE PYELOGRAM, URETEROSCOPY, BASKETING OF STONES AND BILATERAL STENT PLACEMENTS;  Surgeon: [name], MD;  [facility];  Service: Urology;  Laterality: Bilateral;  75 MINS   CYSTOSCOPY/RETROGRADE/URETEROSCOPY/STONE EXTRACTION WITH BASKET Left 10/05/2017   Procedure: CYSTOSCOPY/RETROGRADE/STONE REMOVAL FROM BLADDER ;  Surgeon: Bjorn Pippin, MD;  [facility];  Service: Urology;  Laterality: Left;   CYSTOSCOPY/URETEROSCOPY/HOLMIUM LASER/STENT PLACEMENT Right 09/28/2017   Procedure: CYSTOSCOPY/RIGHT RETROGRADE URETEROSCOPY/HOLMIUM LASER/ RIGHT STENT PLACEMENT;  Surgeon: Rene Paci, MD;  [facility];  Service: Urology;  Laterality: Right;   CYSTOSCOPY/URETEROSCOPY/HOLMIUM LASER/STENT PLACEMENT Bilateral 01/27/2018   Procedure: CYSTOSCOPY/URETEROSCOPY/HOLMIUM LASER/STENT PLACEMENT;  Surgeon: [name], MD;  Location: [facility];  Service: Urology;  Laterality: Bilateral;   CYSTOSCOPY/URETEROSCOPY/HOLMIUM LASER/STENT PLACEMENT Right 08/08/2020   Procedure: CYSTOSCOPY/URETEROSCOPY/RETROGRADE/ HOLMIUM LASER/STENT PLACEMENT;  Surgeon: Marcine Matar, MD;  [facility];  Service: Urology;  Laterality: Right;   HOLMIUM LASER APPLICATION Bilateral 06/26/2017   Procedure: HOLMIUM LASER APPLICATION;  Surgeon: [name], MD;  Location: [facility];  Service: Urology;  Laterality: Bilateral;   HOLMIUM LASER APPLICATION Right 09/18/2017   Procedure: HOLMIUM LASER APPLICATION;  Surgeon: Crist Fat, MD;  [facility];  Service: Urology;  Laterality: Right;   HOLMIUM LASER APPLICATION Bilateral 10/13/2017   Procedure: HOLMIUM LASER APPLICATION;  Surgeon: [name], MD;  [facility];  Service: Urology;  Laterality: Bilateral;   HOLMIUM LASER APPLICATION Bilateral 01/27/2018    Procedure: HOLMIUM LASER APPLICATION;  Surgeon: [name], MD;  Location: [facility];  Service: Urology;  Laterality: Bilateral;   HOLMIUM LASER APPLICATION Bilateral 03/24/2018   Procedure: HOLMIUM LASER APPLICATION;  Surgeon: [name], MD;  [facility];  Service: Urology;  Laterality: Bilateral;   HOLMIUM LASER APPLICATION Bilateral 07/07/2018   Procedure: HOLMIUM LASER APPLICATION;  Surgeon: [name], MD;  [facility];  Service: Urology;  Laterality: Bilateral;   HOLMIUM LASER APPLICATION Left 01/12/2019   Procedure: HOLMIUM LASER APPLICATION;  Surgeon: [name], MD;  [facility];  Service: Urology;  Laterality: Left;   HOLMIUM LASER APPLICATION Bilateral 11/02/2019   Procedure: HOLMIUM LASER APPLICATION;  Surgeon: [name], MD;  Location: [facility];  Service: Urology;  Laterality: Bilateral;   PERCUTANEOUS NEPHROSTOLITHOTOMY  2003;  2009;  06-22-2014; 01-30-2015  @ [facility]   URETEROSCOPIC STONE MANIPULATION UNILATERAL  multiple since 1997--  last one 02-19-2017  @ [facility]       History reviewed. No pertinent family history.  Social History   Tobacco Use   Smoking status: Never   Smokeless tobacco: Never  Vaping Use   Vaping Use: Never used  Substance Use Topics   Alcohol use: Never   Drug use: Never    Home Medications Prior to Admission medications   Medication Sig Start Date End Date Taking? Authorizing Provider  cephALEXin (KEFLEX) 500 MG capsule Take 1 capsule (500 mg total) by mouth 2 (two) times daily for 3 days. To prevent infection with tethered stents 10/12/20 10/15/20 Yes [name], MD  ibuprofen (ADVIL) 200 MG tablet Take 600 mg by mouth every 6 (six) hours as needed for mild pain.   Yes [provider]  oxyCODONE-acetaminophen (PERCOCET) 5-325 MG tablet Take 1 tablet by mouth  every 6 (six) hours as needed for severe pain (post-operatively). 10/12/20 10/12/21 Yes [name], MD   melatonin 5 MG TABS Take 5 mg by mouth at bedtime as needed (sleep). Patient not taking: No sig reported    [provider]  oxybutynin (DITROPAN) 5 MG tablet Take 1 tablet (5 mg total) by mouth every 8 (eight) hours as needed for bladder spasms. Patient not taking: No sig reported 08/08/20   Marcine Matar, MD  tamsulosin (FLOMAX) 0.4 MG CAPS capsule Take 1 capsule (0.4 mg total) by mouth daily. Patient not taking: Reported on 10/13/2020 04/19/20   McDonald, Pedro Earls A, PA-C    Allergies    Morphine and Other  Review of Systems   Review of Systems  All other systems reviewed and are negative.  Physical Exam Updated Vital Signs BP (!) 139/91   Pulse 88   Temp 98.2 F (36.8 C) (Oral)   Resp 16   Ht 5\' 10"  (1.778 m)   Wt 109.6 kg   SpO2 95%   BMI 34.67 kg/m   Physical Exam Vitals and nursing note reviewed.  Constitutional:      Appearance: He is well-developed.  HENT:     Head: Normocephalic and atraumatic.  Eyes:     Conjunctiva/sclera: Conjunctivae normal.  Cardiovascular:     Rate and Rhythm: Normal rate and regular rhythm.     Heart sounds: No murmur heard. Pulmonary:     Effort: Pulmonary effort is normal. No respiratory distress.     Breath sounds: Normal breath sounds.  Abdominal:     Palpations: Abdomen is soft.     Tenderness: There is no abdominal tenderness.  Musculoskeletal:        General: Normal range of motion.     Cervical back: Neck supple.  Skin:    General: Skin is warm and dry.  Neurological:     Mental Status: He is alert and oriented to person, place, and time.     Motor: No weakness.  Psychiatric:        Mood and Affect: Mood normal.        Behavior: Behavior normal.    ED Results / Procedures / Treatments   Labs (all labs ordered are listed, but only abnormal results are displayed) Labs Reviewed  URINALYSIS, ROUTINE W REFLEX MICROSCOPIC - Abnormal; Notable for the following components:      Result Value   Color, Urine RED (*)     APPearance TURBID (*)    Glucose, UA   (*)    Value: TEST NOT REPORTED DUE TO COLOR INTERFERENCE OF URINE PIGMENT   Hgb urine dipstick   (*)    Value: TEST NOT REPORTED DUE TO COLOR INTERFERENCE OF URINE PIGMENT   Bilirubin Urine   (*)    Value: TEST NOT REPORTED DUE TO COLOR INTERFERENCE OF URINE PIGMENT   Ketones, ur   (*)    Value: TEST NOT REPORTED DUE TO COLOR INTERFERENCE OF URINE PIGMENT   Protein, ur   (*)    Value: TEST NOT REPORTED DUE TO COLOR INTERFERENCE OF URINE PIGMENT   Nitrite   (*)    Value: TEST NOT REPORTED DUE TO COLOR INTERFERENCE OF URINE PIGMENT   Leukocytes,Ua   (*)    Value: TEST NOT REPORTED DUE TO COLOR INTERFERENCE OF URINE PIGMENT   RBC / HPF >50 (*)    All other components within normal limits  CBC WITH DIFFERENTIAL/PLATELET  COMPREHENSIVE METABOLIC PANEL    EKG  None  Radiology DG C-Arm 1-60 Min-No Report  Result Date: 10/12/2020 Fluoroscopy was utilized by the requesting physician.  No radiographic interpretation.   CT Renal Stone Study  Result Date: 10/13/2020 CLINICAL DATA:  Flank pain. EXAM: CT ABDOMEN AND PELVIS WITHOUT CONTRAST TECHNIQUE: Multidetector CT imaging of the abdomen and pelvis was performed following the standard protocol without IV contrast. COMPARISON:  August 13, 2020 FINDINGS: Lower chest: No acute abnormality. Hepatobiliary: No focal liver abnormality is seen. No gallstones, gallbladder wall thickening, or biliary dilatation. Pancreas: Unremarkable. No pancreatic ductal dilatation or surrounding inflammatory changes. Spleen: Normal in size without focal abnormality. Adrenals/Urinary Tract: Normal adrenal glands. Bilateral double-J ureteral stents in place. Resolution of previously seen right hydroureter and hydronephrosis. No evidence ureteral calculi. Bilateral nonobstructive renal calculi the largest measuring 3.5 mm in the left kidney. Small amount of gas within the superior right renal pelvis, likely postprocedural.  Stomach/Bowel: Stomach is within normal limits. Appendix appears normal. No evidence of bowel wall thickening, distention, or inflammatory changes. Vascular/Lymphatic: No significant vascular findings are present. No enlarged abdominal or pelvic lymph nodes. Reproductive: Prostate is unremarkable. Other: No abdominal wall hernia or abnormality. No abdominopelvic ascites. 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 IMPRESSION: 1. Bilateral double-J ureteral stents in place. Resolution of previously seen right hydroureter and hydronephrosis. No evidence of ureteral calculi. 2. Bilateral nonobstructive renal calculi. 3. Small amount of gas within the superior right renal pelvis, likely postprocedural. 4. 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 Electronically Signed   By: Ted Mcalpine M.D.   On: 10/13/2020 17:44    Procedures Procedures   Medications Ordered in ED Medications  ketorolac (TORADOL) 30 MG/ML injection 30 mg (has no administration in time range)  ondansetron (ZOFRAN) injection 4 mg (has no administration in time range)  oxyCODONE-acetaminophen (PERCOCET/ROXICET) 5-325 MG per tablet 1 tablet (1 tablet Oral Given 10/13/20 1720)    ED Course  I have reviewed the triage vital signs and the nursing notes.  Pertinent labs & imaging results that were available during my care of the patient were reviewed by me and considered in my medical decision making (see chart for details).    MDM Rules/Calculators/A&P                          Patient here with abdominal and flank pain in the setting of ureteral stent placement yesterday.  He reports that the pain has worsened.    CT shows that stents are in place with resolution of hydroureter and hydronephrosis.  Will treat pain and check labs.  Urine is grossly bloody and unable to have UA.  He has been on antibiotic.  He is not febrile.    Potassium was initially 5.5, suspect  hemolysis, repeat was 4.1.  Case discussed with Dr. Wilkie Aye, who agrees with plan for pain control and discharge.  Patient feels somewhat improved after pain medicines in the ED.  I will give him a few more Percocet for home and we can try some AZO to see if that will help any.  He is encouraged to follow-up with his urologist. Final Clinical Impression(s) / ED Diagnoses Final diagnoses:  Gross hematuria  Lower abdominal pain    Rx / DC Orders ED Discharge Orders     None        Roxy Horseman, PA-C 10/14/20 0326    Shon Baton, MD 10/14/20 [phone]

## 2020-10-13 NOTE — ED Provider Notes (Signed)
Emergency Medicine Provider Triage Evaluation Note  Devin Sanders , a 39 y.o. male  was evaluated in triage.  Pt complains of post op pain.  Review of Systems  Positive: Abd pain, hematuria, nausea Negative: fever  Physical Exam  BP (!) 168/110 (BP Location: Left Arm)   Pulse 96   Temp 98.2 F (36.8 C) (Oral)   Resp 18   Ht 5\' 10"  (1.778 m)   Wt 109.6 kg   SpO2 93%   BMI 34.67 kg/m  Gen:   Awake, appears uncomfortable Resp:  Normal effort  MSK:   Moves extremities without difficulty  Other:  TTP suprapubic  Medical Decision Making  Medically screening exam initiated at 5:13 PM.  Appropriate orders placed.  was informed that the remainder of the evaluation will be completed by another provider, this initial triage assessment does not replace that evaluation, and the importance of remaining in the ED until their evaluation is complete.  Pt with hx of kidney stones, had bilateral ureteral stents placed yesterday but has had persistent abd pain, hematuria passing clots.     Devin Brothers, PA-C 10/13/20 1718    Long, 12/13/20, MD 10/15/20 [phone]

## 2020-10-13 NOTE — ED Triage Notes (Signed)
Patient states that he had multiple stones removed and had right and left stents placed. Patient states the pain has gotten worse since yesterday. Patient states he has never had an issue before when he had stents placed. Patient states he is having blood clots when he urinates.Patient denies any fever.

## 2020-10-14 ENCOUNTER — Inpatient Hospital Stay (HOSPITAL_COMMUNITY)
Admission: EM | Admit: 2020-10-14 | Discharge: 2020-10-18 | DRG: 700 | Disposition: A | Discharge status: Home / Self Care | Payer: PRIVATE HEALTH INSURANCE | Attending: Urology | Admitting: Urology

## 2020-10-14 ENCOUNTER — Encounter (HOSPITAL_COMMUNITY): Payer: Self-pay

## 2020-10-14 ENCOUNTER — Other Ambulatory Visit: Payer: Self-pay

## 2020-10-14 DIAGNOSIS — K219 Gastro-esophageal reflux disease without esophagitis: Secondary | ICD-10-CM | POA: Diagnosis present

## 2020-10-14 DIAGNOSIS — N23 Unspecified renal colic: Secondary | ICD-10-CM

## 2020-10-14 DIAGNOSIS — Z6834 Body mass index (BMI) 34.0-34.9, adult: Secondary | ICD-10-CM

## 2020-10-14 DIAGNOSIS — Z91018 Allergy to other foods: Secondary | ICD-10-CM

## 2020-10-14 DIAGNOSIS — Z885 Allergy status to narcotic agent status: Secondary | ICD-10-CM

## 2020-10-14 DIAGNOSIS — Z79899 Other long term (current) drug therapy: Secondary | ICD-10-CM

## 2020-10-14 DIAGNOSIS — Z87442 Personal history of urinary calculi: Secondary | ICD-10-CM

## 2020-10-14 DIAGNOSIS — R310 Gross hematuria: Secondary | ICD-10-CM | POA: Diagnosis present

## 2020-10-14 DIAGNOSIS — Z20822 Contact with and (suspected) exposure to covid-19: Secondary | ICD-10-CM | POA: Diagnosis present

## 2020-10-14 DIAGNOSIS — E669 Obesity, unspecified: Secondary | ICD-10-CM | POA: Diagnosis present

## 2020-10-14 DIAGNOSIS — Z8616 Personal history of COVID-19: Secondary | ICD-10-CM

## 2020-10-14 DIAGNOSIS — R338 Other retention of urine: Secondary | ICD-10-CM | POA: Diagnosis present

## 2020-10-14 DIAGNOSIS — N9989 Other postprocedural complications and disorders of genitourinary system: Principal | ICD-10-CM | POA: Diagnosis present

## 2020-10-14 DIAGNOSIS — R339 Retention of urine, unspecified: Secondary | ICD-10-CM | POA: Diagnosis present

## 2020-10-14 DIAGNOSIS — R102 Pelvic and perineal pain: Secondary | ICD-10-CM | POA: Diagnosis present

## 2020-10-14 DIAGNOSIS — Y838 Other surgical procedures as the cause of abnormal reaction of the patient, or of later complication, without mention of misadventure at the time of the procedure: Secondary | ICD-10-CM | POA: Diagnosis present

## 2020-10-14 LAB — CBC WITH DIFFERENTIAL/PLATELET
Abs Immature Granulocytes: 0.05 10*3/uL (ref 0.00–0.07)
Basophils Absolute: 0 10*3/uL (ref 0.0–0.1)
Basophils Relative: 0 %
Eosinophils Absolute: 0 10*3/uL (ref 0.0–0.5)
Eosinophils Relative: 0 %
HCT: 44.8 % (ref 39.0–52.0)
Hemoglobin: 15.2 g/dL (ref 13.0–17.0)
Immature Granulocytes: 0 %
Lymphocytes Relative: 21 %
Lymphs Abs: 2.4 10*3/uL (ref 0.7–4.0)
MCH: 33.3 pg (ref 26.0–34.0)
MCHC: 33.9 g/dL (ref 30.0–36.0)
MCV: 98.2 fL (ref 80.0–100.0)
Monocytes Absolute: 0.8 10*3/uL (ref 0.1–1.0)
Monocytes Relative: 7 %
Neutro Abs: 8 10*3/uL — ABNORMAL HIGH (ref 1.7–7.7)
Neutrophils Relative %: 72 %
Platelets: 244 10*3/uL (ref 150–400)
RBC: 4.56 MIL/uL (ref 4.22–5.81)
RDW: 11.6 % (ref 11.5–15.5)
WBC: 11.3 10*3/uL — ABNORMAL HIGH (ref 4.0–10.5)
nRBC: 0 % (ref 0.0–0.2)

## 2020-10-14 LAB — CBC
HCT: 41.7 % (ref 39.0–52.0)
Hemoglobin: 14.6 g/dL (ref 13.0–17.0)
MCH: 33.7 pg (ref 26.0–34.0)
MCHC: 35 g/dL (ref 30.0–36.0)
MCV: 96.3 fL (ref 80.0–100.0)
Platelets: 242 10*3/uL (ref 150–400)
RBC: 4.33 MIL/uL (ref 4.22–5.81)
RDW: 11.4 % — ABNORMAL LOW (ref 11.5–15.5)
WBC: 13.2 10*3/uL — ABNORMAL HIGH (ref 4.0–10.5)
nRBC: 0 % (ref 0.0–0.2)

## 2020-10-14 LAB — COMPREHENSIVE METABOLIC PANEL
ALT: 24 U/L (ref 0–44)
AST: 31 U/L (ref 15–41)
Albumin: 4.3 g/dL (ref 3.5–5.0)
Alkaline Phosphatase: 66 U/L (ref 38–126)
Anion gap: 7 (ref 5–15)
BUN: 18 mg/dL (ref 6–20)
CO2: 24 mmol/L (ref 22–32)
Calcium: 9 mg/dL (ref 8.9–10.3)
Chloride: 105 mmol/L (ref 98–111)
Creatinine, Ser: 1.21 mg/dL (ref 0.61–1.24)
GFR, Estimated: >60 mL/min (ref >60)
Glucose, Bld: 103 mg/dL — ABNORMAL HIGH (ref 70–99)
Potassium: 5.5 mmol/L — ABNORMAL HIGH (ref 3.5–5.1)
Sodium: 136 mmol/L (ref 135–145)
Total Bilirubin: 1.6 mg/dL — ABNORMAL HIGH (ref 0.3–1.2)
Total Protein: 7.4 g/dL (ref 6.5–8.1)

## 2020-10-14 LAB — BASIC METABOLIC PANEL
Anion gap: 8 (ref 5–15)
BUN: 23 mg/dL — ABNORMAL HIGH (ref 6–20)
CO2: 25 mmol/L (ref 22–32)
Calcium: 9.1 mg/dL (ref 8.9–10.3)
Chloride: 103 mmol/L (ref 98–111)
Creatinine, Ser: 1.28 mg/dL — ABNORMAL HIGH (ref 0.61–1.24)
GFR, Estimated: >60 mL/min (ref >60)
Glucose, Bld: 136 mg/dL — ABNORMAL HIGH (ref 70–99)
Potassium: 4.1 mmol/L (ref 3.5–5.1)
Sodium: 136 mmol/L (ref 135–145)

## 2020-10-14 LAB — POTASSIUM: Potassium: 4.1 mmol/L (ref 3.5–5.1)

## 2020-10-14 MED ORDER — HYDROCODONE-ACETAMINOPHEN 5-325 MG PO TABS
2.0000 | ORAL_TABLET | Freq: Once | ORAL | Status: AC
Start: 2020-10-14 — End: 2020-10-14
  Administered 2020-10-14: 2 via ORAL
  Filled 2020-10-14: qty 2

## 2020-10-14 MED ORDER — HYDROMORPHONE HCL 1 MG/ML IJ SOLN
0.5000 mg | Freq: Once | INTRAMUSCULAR | Status: AC
  Administered 2020-10-15: 0.5 mg via INTRAVENOUS
  Filled 2020-10-14: qty 1

## 2020-10-14 MED ORDER — ONDANSETRON 4 MG PO TBDP: 4.0000 mg | ORAL_TABLET | Freq: Once | ORAL | Status: DC

## 2020-10-14 MED ORDER — ONDANSETRON HCL 4 MG/2ML IJ SOLN
4.0000 mg | Freq: Once | INTRAMUSCULAR | Status: AC
  Administered 2020-10-14: 4 mg via INTRAVENOUS
  Filled 2020-10-14: qty 2

## 2020-10-14 MED ORDER — PHENAZOPYRIDINE HCL 95 MG PO TABS: 95.0000 mg | ORAL_TABLET | Freq: Three times a day (TID) | ORAL | 0 refills | Status: DC | PRN

## 2020-10-14 MED ORDER — OXYCODONE-ACETAMINOPHEN 5-325 MG PO TABS: 2.0000 | ORAL_TABLET | Freq: Four times a day (QID) | ORAL | 0 refills | Status: DC | PRN

## 2020-10-14 MED ORDER — DIPHENHYDRAMINE HCL 50 MG/ML IJ SOLN
12.5000 mg | Freq: Once | INTRAMUSCULAR | Status: AC
  Administered 2020-10-14: 12.5 mg via INTRAVENOUS
  Filled 2020-10-14: qty 1

## 2020-10-14 MED ORDER — DIPHENHYDRAMINE HCL 50 MG/ML IJ SOLN
25.0000 mg | Freq: Once | INTRAMUSCULAR | Status: AC
  Administered 2020-10-14: 25 mg via INTRAVENOUS
  Filled 2020-10-14: qty 1

## 2020-10-14 MED ORDER — HYDROMORPHONE HCL 1 MG/ML IJ SOLN
1.0000 mg | Freq: Once | INTRAMUSCULAR | Status: AC
  Administered 2020-10-14: 1 mg via INTRAVENOUS
  Filled 2020-10-14: qty 1

## 2020-10-14 NOTE — ED Provider Notes (Signed)
Emergency Medicine Provider Triage Evaluation Note  Devin Sanders , a 40 y.o. male  was evaluated in triage.  Pt complains of urinary retention since 7 PM.  Patient had ureteral stents removed earlier today has had significant pain inability to urinate.  The pain is so severe it has made him nauseated.  Review of Systems  Positive: Urinary retention, abdominal pain Negative: Fever, chills  Physical Exam  BP (!) 182/135 (BP Location: Left Arm)   Pulse (!) 103   Temp 98.2 F (36.8 C) (Oral)   Resp (!) 22   Ht 5\' 10"  (1.778 m)   Wt 109.3 kg   SpO2 93%   BMI 34.58 kg/m  Gen:   Awake, no distress   Resp:  Normal effort  MSK:   Moves extremities without difficulty  Other:  Actively vomiting in triage  Medical Decision Making  Medically screening exam initiated at 9:21 PM.  Appropriate orders placed.  was informed that the remainder of the evaluation will be completed by another provider, this initial triage assessment does not replace that evaluation, and the importance of remaining in the ED until their evaluation is complete.     [name] 10/14/20 2126    2127, MD 10/15/20 [phone]

## 2020-10-14 NOTE — ED Triage Notes (Signed)
Pt states that he had stents removed today and complains of pain. Pt states that since 7 pm he has been having difficulty urinating and only small amounts are coming out.

## 2020-10-14 NOTE — ED Notes (Signed)
Bladder scan:  , provider notified

## 2020-10-14 NOTE — ED Provider Notes (Addendum)
[facility]-EMERGENCY DEPT Provider Note   CSN: [id number] Arrival date & time: 10/14/20  2051     History Chief Complaint  Patient presents with   Urinary Retention   Flank Pain    Devin Sanders is a 40 y.o. male with a history of cystinuria and frequent kidney stones who presents to the emergency department with a chief complaint of urinary retention.  The patient reports that he has been unable to void since approximately 18:30-19:00.  Reports that around that time that he developed sudden onset, constant, worsening bilateral lower abdominal pain and distention.  Pain is 10 out of 10, constant, nonradiating.  He is unable to characterize the pain.  He reports that the pain has been so intense that he has had persistent vomiting since onset of pain.  He is actively vomiting in the room. He has been having gross hematuria since yesterday, but reports that he has had increasingly large clots since onset of hematuria. He removed his stents earlier today, as directed by urology.   No fever, chills, flank pain, diarrhea, constipation, penile or testicular pain or swelling, chest pain, shortness of breath, dizziness, lightheadedness.   He had a cystoscopy with bilateral ureteral stent placement on 10/7 with Dr. Berneice Heinrich.  Yesterday, the patient was seen in the emergency department for persistent flank pain and gross hematuria.  CT was performed and ureteral stents were in place and there was resolution of previously seen right hydroureter and hydronephrosis.  There was a small amount of gas noted within the right renal pelvis, but that was thought to be postprocedural.  His pain was controlled after he had reassuring labs and he was discharged home with pain medication and AZO with follow-up to his urologist.  He does not take any blood thinners.  The history is provided by the patient and medical records. No language interpreter was used.      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 (condition make renal stones)   GERD (gastroesophageal reflux disease)    pt denied   History of kidney stones    Left ureteral calculus    Renal calculus, left    Wears glasses     Patient Active Problem List   Diagnosis Date Noted   Ureteral stone with hydronephrosis 08/08/2020   Hydronephrosis of right kidney 09/28/2017    Past Surgical History:  Procedure Laterality Date   CYSTOSCOPY WITH RETROGRADE PYELOGRAM, URETEROSCOPY AND STENT PLACEMENT Bilateral 06/26/2017   Procedure: CYSTOSCOPY WITH RETROGRADE PYELOGRAM, URETEROSCOPY, STONE BASKETRY  AND STENT PLACEMENT;  Surgeon: [name], MD;  Location: [facility];  Service: Urology;  Laterality: Bilateral;   CYSTOSCOPY WITH RETROGRADE PYELOGRAM, URETEROSCOPY AND STENT PLACEMENT Right 09/18/2017   Procedure: CYSTOSCOPY WITH RETROGRADE PYELOGRAM, URETEROSCOPY AND STENT PLACEMENT;  Surgeon: Crist Fat, MD;  Location: [facility];  Service: Urology;  Laterality: Right;   CYSTOSCOPY WITH RETROGRADE PYELOGRAM, URETEROSCOPY AND STENT PLACEMENT Bilateral 10/13/2017   Procedure: CYSTOSCOPY WITH RETROGRADE PYELOGRAM, BILATERAL URETEROSCOPY AND BILATERAL STENT PLACEMENT, LASER;  Surgeon: [name], MD;  Location: [facility];  Service: Urology;  Laterality: Bilateral;  90 MINS   CYSTOSCOPY WITH RETROGRADE PYELOGRAM, URETEROSCOPY AND STENT PLACEMENT Bilateral 03/24/2018   Procedure: CYSTOSCOPY WITH RETROGRADE PYELOGRAM, URETEROSCOPY AND STENT PLACEMENT;  Surgeon: [name], MD;  Location: [facility];  Service: Urology;  Laterality: Bilateral;  1 HR   CYSTOSCOPY WITH  RETROGRADE PYELOGRAM, URETEROSCOPY AND STENT PLACEMENT Bilateral 07/07/2018   Procedure: CYSTOSCOPY WITH RETROGRADE PYELOGRAM, URETEROSCOPY AND STENT PLACEMENT;  Surgeon: [name], MD;  Location: [facility];  Service: Urology;  Laterality: Bilateral;  75 MINUTES    CYSTOSCOPY WITH RETROGRADE PYELOGRAM, URETEROSCOPY AND STENT PLACEMENT Left 01/12/2019   Procedure: CYSTOSCOPY WITH RETROGRADE PYELOGRAM, URETEROSCOPY AND STENT PLACEMENT;  Surgeon: [name], MD;  Location: [facility];  Service: Urology;  Laterality: Left;  90 MINS   CYSTOSCOPY WITH RETROGRADE PYELOGRAM, URETEROSCOPY AND STENT PLACEMENT Left 01/28/2019   Procedure: CYSTOSCOPY WITH RETROGRADE PYELOGRAM, URETEROSCOPY AND STENT EXCHANGE STONE BASKET EXTRACTION ;  Surgeon: [name], MD;  Location: [facility];  Service: Urology;  Laterality: Left;   CYSTOSCOPY WITH RETROGRADE PYELOGRAM, URETEROSCOPY AND STENT PLACEMENT Bilateral 05/13/2019   Procedure: CYSTOSCOPY WITH BILATERAL  RETROGRADE PYELOGRAM, LEFT URETEROSCOPY WITH HOLMIUM LASER AND STENT PLACEMENT;  Surgeon: [name], MD;  Location: [facility];  Service: Urology;  Laterality: Bilateral;   CYSTOSCOPY WITH RETROGRADE PYELOGRAM, URETEROSCOPY AND STENT PLACEMENT Bilateral 11/02/2019   Procedure: CYSTOSCOPY WITH RETROGRADE PYELOGRAM, URETEROSCOPY AND STENT PLACEMENT;  Surgeon: [name], MD;  Location: [facility];  Service: Urology;  Laterality: Bilateral;  90 MINS   CYSTOSCOPY WITH RETROGRADE PYELOGRAM, URETEROSCOPY AND STENT PLACEMENT Bilateral 10/12/2020   Procedure: CYSTOSCOPY WITH RETROGRADE PYELOGRAM, URETEROSCOPY, BASKETING OF STONES AND BILATERAL STENT PLACEMENTS;  Surgeon: [name], MD;  Location: [facility];  Service: Urology;  Laterality: Bilateral;  75 MINS   CYSTOSCOPY/RETROGRADE/URETEROSCOPY/STONE EXTRACTION WITH BASKET Left 10/05/2017   Procedure: CYSTOSCOPY/RETROGRADE/STONE REMOVAL FROM BLADDER ;  Surgeon: Bjorn Pippin, MD;  Location: [facility];  Service: Urology;  Laterality: Left;   CYSTOSCOPY/URETEROSCOPY/HOLMIUM LASER/STENT PLACEMENT Right 09/28/2017   Procedure: CYSTOSCOPY/RIGHT RETROGRADE URETEROSCOPY/HOLMIUM LASER/ RIGHT STENT PLACEMENT;  Surgeon: Rene Paci, MD;  Location:  [facility];  Service: Urology;  Laterality: Right;   CYSTOSCOPY/URETEROSCOPY/HOLMIUM LASER/STENT PLACEMENT Bilateral 01/27/2018   Procedure: CYSTOSCOPY/URETEROSCOPY/HOLMIUM LASER/STENT PLACEMENT;  Surgeon: [name], MD;  Location: [facility];  Service: Urology;  Laterality: Bilateral;   CYSTOSCOPY/URETEROSCOPY/HOLMIUM LASER/STENT PLACEMENT Right 08/08/2020   Procedure: CYSTOSCOPY/URETEROSCOPY/RETROGRADE/ HOLMIUM LASER/STENT PLACEMENT;  Surgeon: Marcine Matar, MD;  Location: [facility];  Service: Urology;  Laterality: Right;   HOLMIUM LASER APPLICATION Bilateral 06/26/2017   Procedure: HOLMIUM LASER APPLICATION;  Surgeon: [name], MD;  Location: [facility];  Service: Urology;  Laterality: Bilateral;   HOLMIUM LASER APPLICATION Right 09/18/2017   Procedure: HOLMIUM LASER APPLICATION;  Surgeon: Crist Fat, MD;  Location: [facility];  Service: Urology;  Laterality: Right;   HOLMIUM LASER APPLICATION Bilateral 10/13/2017   Procedure: HOLMIUM LASER APPLICATION;  Surgeon: [name], MD;  Location: [facility];  Service: Urology;  Laterality: Bilateral;   HOLMIUM LASER APPLICATION Bilateral 01/27/2018   Procedure: HOLMIUM LASER APPLICATION;  Surgeon: [name], MD;  Location: [facility];  Service: Urology;  Laterality: Bilateral;   HOLMIUM LASER APPLICATION Bilateral 03/24/2018   Procedure: HOLMIUM LASER APPLICATION;  Surgeon: [name], MD;  Location: [facility];  Service: Urology;  Laterality: Bilateral;   HOLMIUM LASER APPLICATION Bilateral 07/07/2018   Procedure: HOLMIUM LASER APPLICATION;  Surgeon: [name], MD;  Location: [facility];  Service: Urology;  Laterality: Bilateral;   HOLMIUM LASER APPLICATION Left 01/12/2019   Procedure: HOLMIUM LASER APPLICATION;  Surgeon: [name], MD;  Location: [facility];  Service: Urology;  Laterality: Left;   HOLMIUM LASER APPLICATION Bilateral 11/02/2019   Procedure: HOLMIUM LASER APPLICATION;   Surgeon: [name], MD;  Location: [facility];  Service: Urology;  Laterality: Bilateral;   PERCUTANEOUS NEPHROSTOLITHOTOMY  2003;  2009;  06-22-2014; 01-30-2015  @ [facility]   URETEROSCOPIC STONE MANIPULATION UNILATERAL  multiple since 1997--  last one 02-19-2017  @ [facility]       History reviewed. No pertinent family history.  Social History   Tobacco Use   Smoking status: Never   Smokeless tobacco: Never  Vaping Use   Vaping Use: Never used  Substance Use Topics   Alcohol use: Never   Drug use: Never    Home Medications Prior to Admission medications   Medication Sig Start Date End Date Taking? Authorizing Provider  cephALEXin (KEFLEX) 500 MG capsule Take 1 capsule (500 mg total) by mouth 2 (two) times daily for 3 days. To prevent infection with tethered stents 10/12/20 10/15/20 Yes [name], MD  ibuprofen (ADVIL) 200 MG tablet Take 600 mg by mouth every 6 (six) hours as needed for mild pain.   Yes [provider]  oxyCODONE-acetaminophen (PERCOCET) 5-325 MG tablet Take 1 tablet by mouth every 6 (six) hours as needed for severe pain (post-operatively). 10/12/20 10/12/21 Yes [name], MD  tamsulosin (FLOMAX) 0.4 MG CAPS capsule Take 1 capsule (0.4 mg total) by mouth daily. Patient taking differently: Take 0.4 mg by mouth daily as needed. 04/19/20  Yes Emelynn Rance A, PA-C  melatonin 5 MG TABS Take 5 mg by mouth at bedtime as needed (sleep). Patient not taking: No sig reported    [provider]  oxybutynin (DITROPAN) 5 MG tablet Take 1 tablet (5 mg total) by mouth every 8 (eight) hours as needed for bladder spasms. Patient not taking: No sig reported 08/08/20   Marcine Matar, MD  oxyCODONE-acetaminophen (PERCOCET) 5-325 MG tablet Take 2 tablets by mouth every 6 (six) hours as needed. Patient not taking: Reported on 10/14/2020 10/14/20   Roxy Horseman, PA-C  phenazopyridine (PYRIDIUM) 95 MG tablet Take 1 tablet (95 mg total) by mouth  3 (three) times daily as needed for pain. 10/14/20   Roxy Horseman, PA-C    Allergies    Morphine and Other  Review of Systems   Review of Systems  Constitutional:  Negative for appetite change, chills, diaphoresis and fever.  HENT:  Negative for congestion and sore throat.   Eyes:  Negative for visual disturbance.  Respiratory:  Negative for cough, shortness of breath and wheezing.   Cardiovascular:  Negative for chest pain and palpitations.  Gastrointestinal:  Positive for abdominal distention, abdominal pain, nausea and vomiting. Negative for anal bleeding, blood in stool, constipation and diarrhea.  Genitourinary:  Positive for decreased urine volume, difficulty urinating and hematuria. Negative for dysuria, flank pain, frequency, penile pain, penile swelling, scrotal swelling and testicular pain.  Musculoskeletal:  Negative for back pain, myalgias, neck pain and neck stiffness.  Skin:  Negative for rash.  Allergic/Immunologic: Negative for immunocompromised state.  Neurological:  Negative for dizziness, seizures, syncope, weakness, numbness and headaches.  Psychiatric/Behavioral:  Negative for confusion.    Physical Exam Updated Vital Signs BP 117/85   Pulse 85   Temp 98.2 F (36.8 C) (Oral)   Resp 16   Ht 5\' 10"  (1.778 m)   Wt 109.3 kg   SpO2 92%   BMI 34.58 kg/m   Physical Exam Vitals and nursing note reviewed.  Constitutional:      Appearance: He is well-developed.     Comments: Uncomfortable appearing Actively vomiting  HENT:     Head: Normocephalic.  Eyes:  Conjunctiva/sclera: Conjunctivae normal.  Cardiovascular:     Rate and Rhythm: Normal rate and regular rhythm.     Heart sounds: No murmur heard.   No friction rub. No gallop.  Pulmonary:     Effort: Pulmonary effort is normal. No respiratory distress.     Breath sounds: No stridor. No wheezing, rhonchi or rales.  Chest:     Chest wall: No tenderness.  Abdominal:     General: There is  distension.     Palpations: Abdomen is soft. There is no mass.     Tenderness: There is abdominal tenderness. There is no right CVA tenderness, left CVA tenderness, guarding or rebound.     Hernia: No hernia is present.     Comments: Distention in the suprapubic area with tender to palpation in the bilateral lower abdomen.  No significant pain around site of stent removal.  Hypoactive bowel sounds.  Musculoskeletal:        General: No tenderness.     Cervical back: Neck supple.     Right lower leg: No edema.     Left lower leg: No edema.  Skin:    General: Skin is warm and dry.  Neurological:     Mental Status: He is alert.  Psychiatric:        Behavior: Behavior normal.    ED Results / Procedures / Treatments   Labs (all labs ordered are listed, but only abnormal results are displayed) Labs Reviewed  CBC - Abnormal; Notable for the following components:      Result Value   WBC 13.2 (*)    RDW 11.4 (*)    All other components within normal limits  BASIC METABOLIC PANEL - Abnormal; Notable for the following components:   Glucose, Bld 136 (*)    BUN 23 (*)    Creatinine, Ser 1.28 (*)    All other components within normal limits    EKG None  Radiology CT Renal Stone Study  Result Date: 10/15/2020 CLINICAL DATA:  Urinary tract stone, symptomatic/complicated. Ureteral stent removal earlier today, now with urinary hesitancy. EXAM: CT ABDOMEN AND PELVIS WITHOUT CONTRAST TECHNIQUE: Multidetector CT imaging of the abdomen and pelvis was performed following the standard protocol without IV contrast. COMPARISON:  10/13/2020 FINDINGS: Lower chest: Mild bibasilar atelectasis. The visualized heart and pericardium are unremarkable. Hepatobiliary: No focal liver abnormality is seen. No gallstones, gallbladder wall thickening, or biliary dilatation. Pancreas: Unremarkable Spleen: Unremarkable Adrenals/Urinary Tract: The adrenal glands are unremarkable. The kidneys are normal in position. Mild  left asymmetric cortical scarring. Since the prior examination, the bilateral ureteral stents have been removed. There has developed mild right hydronephrosis and perinephric and periureteric inflammatory stranding. Nonobstructing calculi measuring up to 8 mm calculus within the lower pole the right kidney are unchanged. No ureteral calculus. On the left, numerous nonobstructing renal calculi are again identified measuring up to 5 mm. No hydronephrosis on the left. No ureteral calculi identified. Foley catheter balloon seen within the bladder lumen which appears decompressed. Stomach/Bowel: Stomach is within normal limits. Appendix appears normal. No evidence of bowel wall thickening, distention, or inflammatory changes. No free intraperitoneal gas or fluid. Vascular/Lymphatic: No significant vascular findings are present. No enlarged abdominal or pelvic lymph nodes. Reproductive: Prostate is unremarkable. Other: Small bilateral fat containing umbilical hernias noted. Musculoskeletal: No acute bone abnormality. No lytic or blastic bone lesion. IMPRESSION: Interval removal of bilateral ureteral stents with development of mild right hydronephrosis and perinephric and periureteric inflammatory stranding. No ureteral calculus identified.  This may relate to postprocedural edema, ureteral spasm, or an underlying stricture at the ureterovesicular junction. Stable bilateral nonobstructing nephrolithiasis. Electronically Signed   By: Helyn Numbers M.D.   On: 10/15/2020 04:02   CT Renal Stone Study  Result Date: 10/13/2020 CLINICAL DATA:  Flank pain. EXAM: CT ABDOMEN AND PELVIS WITHOUT CONTRAST TECHNIQUE: Multidetector CT imaging of the abdomen and pelvis was performed following the standard protocol without IV contrast. COMPARISON:  August 13, 2020 FINDINGS: Lower chest: No acute abnormality. Hepatobiliary: No focal liver abnormality is seen. No gallstones, gallbladder wall thickening, or biliary dilatation. Pancreas:  Unremarkable. No pancreatic ductal dilatation or surrounding inflammatory changes. Spleen: Normal in size without focal abnormality. Adrenals/Urinary Tract: Normal adrenal glands. Bilateral double-J ureteral stents in place. Resolution of previously seen right hydroureter and hydronephrosis. No evidence ureteral calculi. Bilateral nonobstructive renal calculi the largest measuring 3.5 mm in the left kidney. Small amount of gas within the superior right renal pelvis, likely postprocedural. Stomach/Bowel: Stomach is within normal limits. Appendix appears normal. No evidence of bowel wall thickening, distention, or inflammatory changes. Vascular/Lymphatic: No significant vascular findings are present. No enlarged abdominal or pelvic lymph nodes. Reproductive: Prostate is unremarkable. Other: No abdominal wall hernia or abnormality. No abdominopelvic ascites. 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 IMPRESSION: 1. Bilateral double-J ureteral stents in place. Resolution of previously seen right hydroureter and hydronephrosis. No evidence of ureteral calculi. 2. Bilateral nonobstructive renal calculi. 3. Small amount of gas within the superior right renal pelvis, likely postprocedural. 4. 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 Electronically Signed   By: Ted Mcalpine M.D.   On: 10/13/2020 17:44    Procedures Procedures   Medications Ordered in ED Medications  ondansetron (ZOFRAN) injection 4 mg (4 mg Intravenous Given 10/14/20 2245)  HYDROmorphone (DILAUDID) injection 1 mg (1 mg Intravenous Given 10/14/20 2250)  diphenhydrAMINE (BENADRYL) injection 12.5 mg (12.5 mg Intravenous Given 10/14/20 2247)  HYDROmorphone (DILAUDID) injection 0.5 mg (0.5 mg Intravenous Given 10/15/20 0026)  metoCLOPramide (REGLAN) injection 10 mg (10 mg Intravenous Given 10/15/20 0239)  ketorolac (TORADOL) 30 MG/ML injection 30 mg (30 mg Intravenous Given 10/15/20  0240)  sodium chloride 0.9 % bolus 1,000 mL (1,000 mLs Intravenous New Bag/Given 10/15/20 0435)  ketamine 50 mg in normal saline 5 mL (10 mg/mL) syringe (33 mg Intravenous Given 10/15/20 0406)  HYDROmorphone (DILAUDID) injection 1 mg (1 mg Intravenous Given 10/15/20 0519)    ED Course  I have reviewed the triage vital signs and the nursing notes.  Pertinent labs & imaging results that were available during my care of the patient were reviewed by me and considered in my medical decision making (see chart for details).  Clinical Course as of 10/15/20 2671  Wynelle Link Oct 14, 2020  2351 Spoke with Dr. Liliane Shi, urology.  Consult appreciated.  Discussed plan of care.  Foley has been placed and patient had 400 cc of output in his Foley prior to irrigation.  Hemoglobin is stable.  Will plan to control the patient's pain, irrigate bladder, and discharge the patient home with Foley catheter with close outpatient follow-up to urology. [MM]  Mon Oct 15, 2020  0330 Patient rechecked.  He is now able to tolerate fluids, but reports that pain is 7 out of 10.  No improvement from previous. [MM]  [phone] Patient rechecked.  Patient has received ketamine.  Reports that it did not improve his pain, but he felt as if he was going to die and saw himself  in heaven.  He request that he does not receive any more ketamine. [MM]    Clinical Course User Index [MM] Brittanyann Wittner, Coral Else, PA-C   MDM Rules/Calculators/A&P                           40 year old male with a history of cystinuria and frequent kidney stones who presents to the emergency department with urinary retention, lower abdominal pain and distention, nausea and vomiting, onset this evening with gross hematuria for the last day.  He had bilateral ureteral stent placements with urology on 10/7.  He remove the stents at home today.  No constitutional symptoms.  Hypertensive.  Vital signs are otherwise unremarkable.  Patient is actively vomiting on exam.  Abdomen is  distended and he has significant tenderness palpation in the bilateral lower abdomen.  I strongly suspect that given gross hematuria with large clots that there is a clot causing urinary retention.  Will order bladder scan.  Discussed the patient with Dr. Estell Harpin, attending physician who is in agreement with work-up and plan.  RN reports reports 113 ccs and did not think that bladder appeared distended on bladder scan.  Given continued hematuria, recommended Foley placement.  Unfortunately, only a 22-gauge three-way Foley was available.  Patient will place regular Foley catheter with manual irrigation.  Given more than 24 hours of hematuria with new retention, urology was consulted.  Does not think that admission is indicated at this time.  Is in agreement with plan for Foley placement and irrigation with close follow-up in the office.  Following manual irrigation, Foley is mostly clear with faint hematuria.  No clots.  Patient has been successfully fluid challenge without additional vomiting.  However, he reports no improvement in his pain.  Pain was aggressively managed in the emergency department with Toradol, multiple doses of Dilaudid, and ketamine.  Patient reports that he had an out of body experience with ketamine, but there is no improvement in pain.  Labs have been reviewed and independently interpreted by me.  Hemoglobin is stable.  No metabolic derangements.  He has a mild leukocytosis, likely reactive.  Urinalysis will not be obtained at this time as the patient has been having gross hematuria.  Although patient had CT imaging yesterday, given resolution of urinary retention with unimproved pain since arrival in the ED, repeat CT stone study was obtained, which demonstrates new development of mild right hydronephrosis and perinephric and periureteral inflammatory stranding.   Given concern for uncontrolled pain, Dr. Liliane Shi with urology was reconsulted who will plan for admission for the  patient. The patient appears reasonably stabilized for admission considering the current resources, flow, and capabilities available in the ED at this time, and I doubt any other [facility] requiring further screening and/or treatment in the ED prior to admission.   Final Clinical Impression(s) / ED Diagnoses Final diagnoses:  Ureteral colic  Urinary retention  Gross hematuria    Rx / DC Orders ED Discharge Orders     None           Barkley Boards, PA-C 10/15/20 0537    Bethann Berkshire, MD 10/15/20 1113

## 2020-10-14 NOTE — Discharge Instructions (Addendum)
Please call the urologist tomorrow.  Return for fever, worsening pain, or persistent vomiting.

## 2020-10-15 ENCOUNTER — Emergency Department (HOSPITAL_COMMUNITY): Payer: PRIVATE HEALTH INSURANCE

## 2020-10-15 DIAGNOSIS — R338 Other retention of urine: Secondary | ICD-10-CM | POA: Diagnosis present

## 2020-10-15 LAB — RESP PANEL BY RT-PCR (FLU A&B, COVID) ARPGX2
Influenza A by PCR: NEGATIVE
Influenza B by PCR: NEGATIVE
SARS Coronavirus 2 by RT PCR: NEGATIVE

## 2020-10-15 MED ORDER — KETOROLAC TROMETHAMINE 30 MG/ML IJ SOLN
30.0000 mg | Freq: Four times a day (QID) | INTRAMUSCULAR | Status: DC
Start: 2020-10-15 — End: 2020-10-18
  Administered 2020-10-15 – 2020-10-18 (×14): 30 mg via INTRAVENOUS
  Filled 2020-10-15 (×14): qty 1

## 2020-10-15 MED ORDER — DIPHENHYDRAMINE HCL 12.5 MG/5ML PO ELIX: 12.5000 mg | ORAL_SOLUTION | Freq: Four times a day (QID) | ORAL | Status: DC | PRN

## 2020-10-15 MED ORDER — ACETAMINOPHEN 325 MG PO TABS
650.0000 mg | ORAL_TABLET | ORAL | Status: DC | PRN
  Filled 2020-10-15: qty 2

## 2020-10-15 MED ORDER — OXYBUTYNIN CHLORIDE 5 MG PO TABS
5.0000 mg | ORAL_TABLET | Freq: Three times a day (TID) | ORAL | Status: DC | PRN
  Administered 2020-10-16 – 2020-10-18 (×2): 5 mg via ORAL
  Filled 2020-10-15 (×2): qty 1

## 2020-10-15 MED ORDER — OXYCODONE HCL 5 MG PO TABS
5.0000 mg | ORAL_TABLET | ORAL | Status: DC | PRN
  Administered 2020-10-15 – 2020-10-18 (×9): 5 mg via ORAL
  Filled 2020-10-15 (×9): qty 1

## 2020-10-15 MED ORDER — METOCLOPRAMIDE HCL 5 MG/ML IJ SOLN
10.0000 mg | Freq: Once | INTRAMUSCULAR | Status: AC
  Administered 2020-10-15: 10 mg via INTRAVENOUS
  Filled 2020-10-15: qty 2

## 2020-10-15 MED ORDER — TAMSULOSIN HCL 0.4 MG PO CAPS
0.4000 mg | ORAL_CAPSULE | Freq: Every day | ORAL | Status: DC
  Administered 2020-10-15 – 2020-10-18 (×4): 0.4 mg via ORAL
  Filled 2020-10-15 (×4): qty 1

## 2020-10-15 MED ORDER — SODIUM CHLORIDE 0.9 % IV SOLN: INTRAVENOUS | Status: DC

## 2020-10-15 MED ORDER — HYDROMORPHONE HCL 1 MG/ML IJ SOLN
1.0000 mg | Freq: Once | INTRAMUSCULAR | Status: AC
Start: 2020-10-15 — End: 2020-10-15
  Administered 2020-10-15: 1 mg via INTRAVENOUS
  Filled 2020-10-15: qty 1

## 2020-10-15 MED ORDER — SODIUM CHLORIDE 0.9 % IV BOLUS
1000.0000 mL | Freq: Once | INTRAVENOUS | Status: AC
Start: 2020-10-15 — End: 2020-10-15
  Administered 2020-10-15: 1000 mL via INTRAVENOUS

## 2020-10-15 MED ORDER — KETOROLAC TROMETHAMINE 30 MG/ML IJ SOLN
30.0000 mg | Freq: Once | INTRAMUSCULAR | Status: AC
  Administered 2020-10-15: 30 mg via INTRAVENOUS
  Filled 2020-10-15: qty 1

## 2020-10-15 MED ORDER — HYDROMORPHONE HCL 1 MG/ML IJ SOLN
0.5000 mg | INTRAMUSCULAR | Status: DC | PRN
  Administered 2020-10-15 – 2020-10-18 (×28): 1 mg via INTRAVENOUS
  Filled 2020-10-15 (×28): qty 1

## 2020-10-15 MED ORDER — ONDANSETRON HCL 4 MG/2ML IJ SOLN
4.0000 mg | INTRAMUSCULAR | Status: DC | PRN
  Administered 2020-10-15 (×2): 4 mg via INTRAVENOUS
  Filled 2020-10-15 (×2): qty 2

## 2020-10-15 MED ORDER — KETAMINE HCL 50 MG/5ML IJ SOSY
0.3000 mg/kg | PREFILLED_SYRINGE | Freq: Once | INTRAMUSCULAR | Status: AC
  Administered 2020-10-15: 33 mg via INTRAVENOUS
  Filled 2020-10-15: qty 5

## 2020-10-15 MED ORDER — DIPHENHYDRAMINE HCL 50 MG/ML IJ SOLN
12.5000 mg | Freq: Four times a day (QID) | INTRAMUSCULAR | Status: DC | PRN
Start: 2020-10-15 — End: 2020-10-18
  Administered 2020-10-15 – 2020-10-16 (×4): 25 mg via INTRAVENOUS
  Administered 2020-10-16: 12.5 mg via INTRAVENOUS
  Administered 2020-10-17 – 2020-10-18 (×5): 25 mg via INTRAVENOUS
  Filled 2020-10-15 (×10): qty 1

## 2020-10-15 NOTE — ED Notes (Signed)
Irrigated bladder with 1L of saline, urine ran clear towards end.  Several small blood clots visualized  during irrigation.  Pt tolerated well.

## 2020-10-15 NOTE — H&P (Signed)
H&P  Chief Complaint: flank pain  History of Present Illness: Devin Sanders is a 40 y.o. year old male with a history of cystinuria requiring multiple ureteroscopic interventions.  He is status post bilateral ureteroscopy with laser lithotripsy on 10/12/2020 with Dr. Berneice Heinrich.  He presents to the emergency department with a 12-hour history of progressively worsening bilateral flank pain associated with gross hematuria, emesis and urinary retention following stent removal yesterday.  CT shows very mild right-sided hydronephrosis with slight perinephric stranding with no appreciable stone burden within either ureter.  The patient has required multiple pain medications while in the emergency department that have only marginally improved his symptoms.  He denies any subjective fevers or chills at home.  Prior to his surgery, he denies any lower urinary tract symptoms and currently does not require BPH treatment.  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)   GERD (gastroesophageal reflux disease)    pt denied   History of kidney stones    Left ureteral calculus    Renal calculus, left    Wears glasses     Past Surgical History:  Procedure Laterality Date   CYSTOSCOPY WITH RETROGRADE PYELOGRAM, URETEROSCOPY AND STENT PLACEMENT Bilateral 06/26/2017   Procedure: CYSTOSCOPY WITH RETROGRADE PYELOGRAM, URETEROSCOPY, STONE BASKETRY  AND STENT PLACEMENT;  Surgeon: [name], MD;  Location: [facility];  Service: Urology;  Laterality: Bilateral;   CYSTOSCOPY WITH RETROGRADE PYELOGRAM, URETEROSCOPY AND STENT PLACEMENT Right 09/18/2017   Procedure: CYSTOSCOPY WITH RETROGRADE PYELOGRAM, URETEROSCOPY AND STENT PLACEMENT;  Surgeon: Crist Fat, MD;  Location: [facility];  Service: Urology;  Laterality: Right;   CYSTOSCOPY WITH RETROGRADE PYELOGRAM, URETEROSCOPY AND STENT PLACEMENT Bilateral  10/13/2017   Procedure: CYSTOSCOPY WITH RETROGRADE PYELOGRAM, BILATERAL URETEROSCOPY AND BILATERAL STENT PLACEMENT, LASER;  Surgeon: [name], MD;  Location: [facility];  Service: Urology;  Laterality: Bilateral;  90 MINS   CYSTOSCOPY WITH RETROGRADE PYELOGRAM, URETEROSCOPY AND STENT PLACEMENT Bilateral 03/24/2018   Procedure: CYSTOSCOPY WITH RETROGRADE PYELOGRAM, URETEROSCOPY AND STENT PLACEMENT;  Surgeon: [name], MD;  Location: [facility];  Service: Urology;  Laterality: Bilateral;  1 HR   CYSTOSCOPY WITH RETROGRADE PYELOGRAM, URETEROSCOPY AND STENT PLACEMENT Bilateral 07/07/2018   Procedure: CYSTOSCOPY WITH RETROGRADE PYELOGRAM, URETEROSCOPY AND STENT PLACEMENT;  Surgeon: [name], MD;  Location: [facility];  Service: Urology;  Laterality: Bilateral;  75 MINUTES   CYSTOSCOPY WITH RETROGRADE PYELOGRAM, URETEROSCOPY AND STENT PLACEMENT Left 01/12/2019   Procedure: CYSTOSCOPY WITH RETROGRADE PYELOGRAM, URETEROSCOPY AND STENT PLACEMENT;  Surgeon: [name], MD;  Location: [facility];  Service: Urology;  Laterality: Left;  90 MINS   CYSTOSCOPY WITH RETROGRADE PYELOGRAM, URETEROSCOPY AND STENT PLACEMENT Left 01/28/2019   Procedure: CYSTOSCOPY WITH RETROGRADE PYELOGRAM, URETEROSCOPY AND STENT EXCHANGE STONE BASKET EXTRACTION ;  Surgeon: [name], MD;  Location: [facility];  Service: Urology;  Laterality: Left;   CYSTOSCOPY WITH RETROGRADE PYELOGRAM, URETEROSCOPY AND STENT PLACEMENT Bilateral 05/13/2019   Procedure: CYSTOSCOPY WITH BILATERAL  RETROGRADE PYELOGRAM, LEFT URETEROSCOPY WITH HOLMIUM LASER AND STENT PLACEMENT;  Surgeon: [name], MD;  Location: [facility];  Service: Urology;  Laterality: Bilateral;   CYSTOSCOPY WITH RETROGRADE PYELOGRAM, URETEROSCOPY AND STENT PLACEMENT Bilateral 11/02/2019   Procedure: CYSTOSCOPY WITH RETROGRADE PYELOGRAM, URETEROSCOPY AND STENT PLACEMENT;  Surgeon: [name], MD;  Location: [facility];  Service: Urology;   Laterality: Bilateral;  90 MINS   CYSTOSCOPY  WITH RETROGRADE PYELOGRAM, URETEROSCOPY AND STENT PLACEMENT Bilateral 10/12/2020   Procedure: CYSTOSCOPY WITH RETROGRADE PYELOGRAM, URETEROSCOPY, BASKETING OF STONES AND BILATERAL STENT PLACEMENTS;  Surgeon: [name], MD;  Location: [facility];  Service: Urology;  Laterality: Bilateral;  75 MINS   CYSTOSCOPY/RETROGRADE/URETEROSCOPY/STONE EXTRACTION WITH BASKET Left 10/05/2017   Procedure: CYSTOSCOPY/RETROGRADE/STONE REMOVAL FROM BLADDER ;  Surgeon: Bjorn Pippin, MD;  Location: [facility];  Service: Urology;  Laterality: Left;   CYSTOSCOPY/URETEROSCOPY/HOLMIUM LASER/STENT PLACEMENT Right 09/28/2017   Procedure: CYSTOSCOPY/RIGHT RETROGRADE URETEROSCOPY/HOLMIUM LASER/ RIGHT STENT PLACEMENT;  Surgeon: Rene Paci, MD;  Location: [facility];  Service: Urology;  Laterality: Right;   CYSTOSCOPY/URETEROSCOPY/HOLMIUM LASER/STENT PLACEMENT Bilateral 01/27/2018   Procedure: CYSTOSCOPY/URETEROSCOPY/HOLMIUM LASER/STENT PLACEMENT;  Surgeon: [name], MD;  Location[facility];  Service: Urology;  Laterality: Bilateral;   CYSTOSCOPY/URETEROSCOPY/HOLMIUM LASER/STENT PLACEMENT Right 08/08/2020   Procedure: CYSTOSCOPY/URETEROSCOPY/RETROGRADE/ HOLMIUM LASER/STENT PLACEMENT;  Surgeon: Marcine Matar, MD;  Location: [facility];  Service: Urology;  Laterality: Right;   HOLMIUM LASER APPLICATION Bilateral 06/26/2017   Procedure: HOLMIUM LASER APPLICATION;  Surgeon: [name], MD;  Location: [facility];  Service: Urology;  Laterality: Bilateral;   HOLMIUM LASER APPLICATION Right 09/18/2017   Procedure: HOLMIUM LASER APPLICATION;  Surgeon: Crist Fat, MD;  Location: [facility];  Service: Urology;  Laterality: Right;   HOLMIUM LASER APPLICATION Bilateral 10/13/2017   Procedure: HOLMIUM LASER APPLICATION;  Surgeon: [name], MD;  Location: [facility];  Service: Urology;  Laterality: Bilateral;   HOLMIUM LASER APPLICATION Bilateral  01/27/2018   Procedure: HOLMIUM LASER APPLICATION;  Surgeon: [name], MD;  Location: [facility];  Service: Urology;  Laterality: Bilateral;   HOLMIUM LASER APPLICATION Bilateral 03/24/2018   Procedure: HOLMIUM LASER APPLICATION;  Surgeon: [name], MD;  Location: [facility];  Service: Urology;  Laterality: Bilateral;   HOLMIUM LASER APPLICATION Bilateral 07/07/2018   Procedure: HOLMIUM LASER APPLICATION;  Surgeon: [name], MD;  Location: [facility];  Service: Urology;  Laterality: Bilateral;   HOLMIUM LASER APPLICATION Left 01/12/2019   Procedure: HOLMIUM LASER APPLICATION;  Surgeon: [name], MD;  Location: [facility];  Service: Urology;  Laterality: Left;   HOLMIUM LASER APPLICATION Bilateral 11/02/2019   Procedure: HOLMIUM LASER APPLICATION;  Surgeon: [name], MD;  Location: [facility];  Service: Urology;  Laterality: Bilateral;   PERCUTANEOUS NEPHROSTOLITHOTOMY  2003;  2009;  06-22-2014; 01-30-2015  @ [facility]   URETEROSCOPIC STONE MANIPULATION UNILATERAL  multiple since 1997--  last one 02-19-2017  @ [facility]    Home Medications:  Current Meds  Medication Sig   cephALEXin (KEFLEX) 500 MG capsule Take 1 capsule (500 mg total) by mouth 2 (two) times daily for 3 days. To prevent infection with tethered stents   ibuprofen (ADVIL) 200 MG tablet Take 600 mg by mouth every 6 (six) hours as needed for mild pain.   oxyCODONE-acetaminophen (PERCOCET) 5-325 MG tablet Take 1 tablet by mouth every 6 (six) hours as needed for severe pain (post-operatively).   tamsulosin (FLOMAX) 0.4 MG CAPS capsule Take 1 capsule (0.4 mg total) by mouth daily. (Patient taking differently: Take 0.4 mg by mouth daily as needed.)    Allergies:  Allergies  Allergen Reactions   Morphine Nausea And Vomiting   Other Hives and Swelling    Mangos    History reviewed. No pertinent family history.  Social History:  reports that he has never smoked. He has never used smokeless  tobacco. He reports that he does not drink alcohol and does not  use drugs.  ROS: A complete review of systems was performed.  All systems are negative except for pertinent findings as noted.  Physical Exam:  Vital signs in last 24 hours: Temp:  [98.2 F (36.8 C)] 98.2 F (36.8 C) (10/09 2058) Pulse Rate:  [80-104] 82 (10/10 0600) Resp:  [16-22] 16 (10/10 0600) BP: (101-182)/(69-135) 106/72 (10/10 0600) SpO2:  [89 %-95 %] 92 % (10/10 0600) Weight:  [109.3 kg] 109.3 kg (10/09 2058) Constitutional:  Alert and oriented, No acute distress Cardiovascular: Regular rate and rhythm, No JVD Respiratory: Normal respiratory effort, Lungs clear bilaterally GI: Abdomen is soft, nontender, nondistended, no abdominal masses GU: Foley catheter in place and draining blood-tinged urine Lymphatic: No lymphadenopathy Neurologic: Grossly intact, no focal deficits Psychiatric: Normal mood and affect    Laboratory Data:  Recent Labs    10/14/20 0015 10/14/20 2250  WBC 11.3* 13.2*  HGB 15.2 14.6  HCT 44.8 41.7  PLT 244 242    Recent Labs    10/14/20 0015 10/14/20 0245 10/14/20 2250  NA 136  --  136  K 5.5* 4.1 4.1  CL 105  --  103  GLUCOSE 103*  --  136*  BUN 18  --  23*  CALCIUM 9.0  --  9.1  CREATININE 1.21  --  1.28*     Results for orders placed or performed during the hospital encounter of 10/14/20 (from the past 24 hour(s))  CBC     Status: Abnormal   Collection Time: 10/14/20 10:50 PM  Result Value Ref Range   WBC 13.2 (H) 4.0 - 10.5 K/uL   RBC 4.33 4.22 - 5.81 MIL/uL   Hemoglobin 14.6 13.0 - 17.0 g/dL   HCT 10.3 15.9 - 45.8 %   MCV 96.3 80.0 - 100.0 fL   MCH 33.7 26.0 - 34.0 pg   MCHC 35.0 30.0 - 36.0 g/dL   RDW 59.2 (L) 92.4 - 46.2 %   Platelets 242 150 - 400 K/uL   nRBC 0.0 0.0 - 0.2 %  Basic metabolic panel     Status: Abnormal   Collection Time: 10/14/20 10:50 PM  Result Value Ref Range   Sodium 136 135 - 145 mmol/L   Potassium 4.1 3.5 - 5.1 mmol/L   Chloride  103 98 - 111 mmol/L   CO2 25 22 - 32 mmol/L   Glucose, Bld 136 (H) 70 - 99 mg/dL   BUN 23 (H) 6 - 20 mg/dL   Creatinine, Ser 8.63 (H) 0.61 - 1.24 mg/dL   Calcium 9.1 8.9 - 81.7 mg/dL   GFR, Estimated >71 >16 mL/min   Anion gap 8 5 - 15  Resp Panel by RT-PCR (Flu A&B, Covid) Nasopharyngeal Swab     Status: None   Collection Time: 10/15/20  6:14 AM   Specimen: Nasopharyngeal Swab; Nasopharyngeal(NP) swabs in vial transport medium  Result Value Ref Range   SARS Coronavirus 2 by RT PCR NEGATIVE NEGATIVE   Influenza A by PCR NEGATIVE NEGATIVE   Influenza B by PCR NEGATIVE NEGATIVE   Recent Results (from the past 240 hour(s))  Resp Panel by RT-PCR (Flu A&B, Covid) Nasopharyngeal Swab     Status: None   Collection Time: 10/15/20  6:14 AM   Specimen: Nasopharyngeal Swab; Nasopharyngeal(NP) swabs in vial transport medium  Result Value Ref Range Status   SARS Coronavirus 2 by RT PCR NEGATIVE NEGATIVE Final    Comment: (NOTE) SARS-CoV-2 target nucleic acids are NOT DETECTED.  The SARS-CoV-2 RNA is generally  detectable in upper respiratory specimens during the acute phase of infection. The lowest concentration of SARS-CoV-2 viral copies this assay can detect is 138 copies/mL. A negative result does not preclude SARS-Cov-2 infection and should not be used as the sole basis for treatment or other patient management decisions. A negative result may occur with  improper specimen collection/handling, submission of specimen other than nasopharyngeal swab, presence of viral mutation(s) within the areas targeted by this assay, and inadequate number of viral copies(<138 copies/mL). A negative result must be combined with clinical observations, patient history, and epidemiological information. The expected result is Negative.  Fact Sheet for Patients:  BloggerCourse.com  Fact Sheet for Healthcare Providers:  SeriousBroker.it  This test is no t  yet approved or cleared by the [place] FDA and  has been authorized for detection and/or diagnosis of SARS-CoV-2 by FDA under an Emergency Use Authorization (EUA). This EUA will remain  in effect (meaning this test can be used) for the duration of the COVID-19 declaration under Section 564(b)(1) of the Act, 21 U.S.C.section 360bbb-3(b)(1), unless the authorization is terminated  or revoked sooner.       Influenza A by PCR NEGATIVE NEGATIVE Final   Influenza B by PCR NEGATIVE NEGATIVE Final    Comment: (NOTE) The Xpert Xpress SARS-CoV-2/FLU/RSV plus assay is intended as an aid in the diagnosis of influenza from Nasopharyngeal swab specimens and should not be used as a sole basis for treatment. Nasal washings and aspirates are unacceptable for Xpert Xpress SARS-CoV-2/FLU/RSV testing.  Fact Sheet for Patients: BloggerCourse.com  Fact Sheet for Healthcare Providers: SeriousBroker.it  This test is not yet approved or cleared by the [place] FDA and has been authorized for detection and/or diagnosis of SARS-CoV-2 by FDA under an Emergency Use Authorization (EUA). This EUA will remain in effect (meaning this test can be used) for the duration of the COVID-19 declaration under Section 564(b)(1) of the Act, 21 U.S.C. section 360bbb-3(b)(1), unless the authorization is terminated or revoked.  Performed at [facility], [address], [place], [place] [address]     Renal Function: Recent Labs    10/14/20 0015 10/14/20 2250  CREATININE 1.21 1.28*   Estimated Creatinine Clearance: 94.9 mL/min (A) (by C-G formula based on SCr of 1.28 mg/dL (H)).  Radiologic Imaging: CT Renal Stone Study  Result Date: 10/15/2020 CLINICAL DATA:  Urinary tract stone, symptomatic/complicated. Ureteral stent removal earlier today, now with urinary hesitancy. EXAM: CT ABDOMEN AND PELVIS WITHOUT CONTRAST TECHNIQUE:  Multidetector CT imaging of the abdomen and pelvis was performed following the standard protocol without IV contrast. COMPARISON:  10/13/2020 FINDINGS: Lower chest: Mild bibasilar atelectasis. The visualized heart and pericardium are unremarkable. Hepatobiliary: No focal liver abnormality is seen. No gallstones, gallbladder wall thickening, or biliary dilatation. Pancreas: Unremarkable Spleen: Unremarkable Adrenals/Urinary Tract: The adrenal glands are unremarkable. The kidneys are normal in position. Mild left asymmetric cortical scarring. Since the prior examination, the bilateral ureteral stents have been removed. There has developed mild right hydronephrosis and perinephric and periureteric inflammatory stranding. Nonobstructing calculi measuring up to 8 mm calculus within the lower pole the right kidney are unchanged. No ureteral calculus. On the left, numerous nonobstructing renal calculi are again identified measuring up to 5 mm. No hydronephrosis on the left. No ureteral calculi identified. Foley catheter balloon seen within the bladder lumen which appears decompressed. Stomach/Bowel: Stomach is within normal limits. Appendix appears normal. No evidence of bowel wall thickening, distention, or inflammatory changes. No free intraperitoneal gas or  fluid. Vascular/Lymphatic: No significant vascular findings are present. No enlarged abdominal or pelvic lymph nodes. Reproductive: Prostate is unremarkable. Other: Small bilateral fat containing umbilical hernias noted. Musculoskeletal: No acute bone abnormality. No lytic or blastic bone lesion. IMPRESSION: Interval removal of bilateral ureteral stents with development of mild right hydronephrosis and perinephric and periureteric inflammatory stranding. No ureteral calculus identified. This may relate to postprocedural edema, ureteral spasm, or an underlying stricture at the ureterovesicular junction. Stable bilateral nonobstructing nephrolithiasis. Electronically  Signed   By: Helyn Numbers M.D.   On: 10/15/2020 04:02   CT Renal Stone Study  Result Date: 10/13/2020 CLINICAL DATA:  Flank pain. EXAM: CT ABDOMEN AND PELVIS WITHOUT CONTRAST TECHNIQUE: Multidetector CT imaging of the abdomen and pelvis was performed following the standard protocol without IV contrast. COMPARISON:  August 13, 2020 FINDINGS: Lower chest: No acute abnormality. Hepatobiliary: No focal liver abnormality is seen. No gallstones, gallbladder wall thickening, or biliary dilatation. Pancreas: Unremarkable. No pancreatic ductal dilatation or surrounding inflammatory changes. Spleen: Normal in size without focal abnormality. Adrenals/Urinary Tract: Normal adrenal glands. Bilateral double-J ureteral stents in place. Resolution of previously seen right hydroureter and hydronephrosis. No evidence ureteral calculi. Bilateral nonobstructive renal calculi the largest measuring 3.5 mm in the left kidney. Small amount of gas within the superior right renal pelvis, likely postprocedural. Stomach/Bowel: Stomach is within normal limits. Appendix appears normal. No evidence of bowel wall thickening, distention, or inflammatory changes. Vascular/Lymphatic: No significant vascular findings are present. No enlarged abdominal or pelvic lymph nodes. Reproductive: Prostate is unremarkable. Other: No abdominal wall hernia or abnormality. No abdominopelvic ascites. 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 IMPRESSION: 1. Bilateral double-J ureteral stents in place. Resolution of previously seen right hydroureter and hydronephrosis. No evidence of ureteral calculi. 2. Bilateral nonobstructive renal calculi. 3. Small amount of gas within the superior right renal pelvis, likely postprocedural. 4. 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 Electronically Signed   By: Ted Mcalpine M.D.   On: 10/13/2020 17:44    Assessment:  40 year old male with renal  colic, urinary retention gross hematuria following bilateral stent removal  Plan:  -Place the patient in observation for pain management and IV fluid resuscitation.  His renal colic is likely secondary to ureteral spasm following stent removal and should resolve with time. -Start tamsulosin 0.4 mg once daily.  Keep Foley catheter in place for the time being.  Rhoderick Moody, MD 10/15/2020, 7:13 AM  [facility] Pager: [phone]

## 2020-10-16 ENCOUNTER — Other Ambulatory Visit: Payer: Self-pay

## 2020-10-16 DIAGNOSIS — Z885 Allergy status to narcotic agent status: Secondary | ICD-10-CM | POA: Diagnosis not present

## 2020-10-16 DIAGNOSIS — N9989 Other postprocedural complications and disorders of genitourinary system: Secondary | ICD-10-CM | POA: Diagnosis present

## 2020-10-16 DIAGNOSIS — K219 Gastro-esophageal reflux disease without esophagitis: Secondary | ICD-10-CM | POA: Diagnosis present

## 2020-10-16 DIAGNOSIS — E669 Obesity, unspecified: Secondary | ICD-10-CM | POA: Diagnosis present

## 2020-10-16 DIAGNOSIS — Y838 Other surgical procedures as the cause of abnormal reaction of the patient, or of later complication, without mention of misadventure at the time of the procedure: Secondary | ICD-10-CM | POA: Diagnosis present

## 2020-10-16 DIAGNOSIS — Z87442 Personal history of urinary calculi: Secondary | ICD-10-CM | POA: Diagnosis not present

## 2020-10-16 DIAGNOSIS — Z20822 Contact with and (suspected) exposure to covid-19: Secondary | ICD-10-CM | POA: Diagnosis present

## 2020-10-16 DIAGNOSIS — Z79899 Other long term (current) drug therapy: Secondary | ICD-10-CM | POA: Diagnosis not present

## 2020-10-16 DIAGNOSIS — Z6834 Body mass index (BMI) 34.0-34.9, adult: Secondary | ICD-10-CM | POA: Diagnosis not present

## 2020-10-16 DIAGNOSIS — R102 Pelvic and perineal pain: Secondary | ICD-10-CM | POA: Diagnosis present

## 2020-10-16 DIAGNOSIS — Z8616 Personal history of COVID-19: Secondary | ICD-10-CM | POA: Diagnosis not present

## 2020-10-16 DIAGNOSIS — N23 Unspecified renal colic: Secondary | ICD-10-CM | POA: Diagnosis present

## 2020-10-16 DIAGNOSIS — R339 Retention of urine, unspecified: Secondary | ICD-10-CM | POA: Diagnosis present

## 2020-10-16 DIAGNOSIS — R310 Gross hematuria: Secondary | ICD-10-CM | POA: Diagnosis present

## 2020-10-16 DIAGNOSIS — Z91018 Allergy to other foods: Secondary | ICD-10-CM | POA: Diagnosis not present

## 2020-10-16 MED ORDER — ACETAMINOPHEN 500 MG PO TABS
1000.0000 mg | ORAL_TABLET | Freq: Three times a day (TID) | ORAL | Status: DC
  Administered 2020-10-16 – 2020-10-18 (×7): 1000 mg via ORAL
  Filled 2020-10-16 (×7): qty 2

## 2020-10-16 MED ORDER — LORATADINE 10 MG PO TABS
10.0000 mg | ORAL_TABLET | Freq: Every day | ORAL | Status: DC
  Administered 2020-10-16 – 2020-10-18 (×3): 10 mg via ORAL
  Filled 2020-10-16 (×3): qty 1

## 2020-10-16 NOTE — Progress Notes (Signed)
Subjective/Chief Complaint:  1 - Pain After Ureteroscopy - admitted through ER early AM 10/10 for pelvic pain after uncomplicated bilateral ureteroscopy to stone free on 10/12/20. No fevers. Cr 1.3, CT stone free, just mild hydro and peri-ureteral stranding as expected. Placed on scheduled ketorolac with PRN hyromorphone.  Today "Devin Sanders" is stable. Stil with difficult to control low pelvid / flank pain. NO fevers.    Objective: Vital signs in last 24 hours: Pulse Rate:  [78-104] 104 (10/11 1558) Resp:  [13-20] 18 (10/11 1558) BP: (104-140)/(66-92) 136/88 (10/11 1558) SpO2:  [90 %-99 %] 93 % (10/11 1558)    Intake/Output from previous day: 10/10 0701 - 10/11 0700 In: -  Out: 1600 [Urine:1600] Intake/Output this shift: Total I/O In: -  Out: 1100 [Urine:1100]  NAD, AOx3, Very pleasant despite being in ER over 24 hours Non-labored breathing on room air RRR by monitor Mild obesity, no CVAT Foley in place (removed during exam) No c/c/e  Lab Results:  Recent Labs    10/14/20 0015 10/14/20 2250  WBC 11.3* 13.2*  HGB 15.2 14.6  HCT 44.8 41.7  PLT 244 242   BMET Recent Labs    10/14/20 0015 10/14/20 0245 10/14/20 2250  NA 136  --  136  K 5.5* 4.1 4.1  CL 105  --  103  CO2 24  --  25  GLUCOSE 103*  --  136*  BUN 18  --  23*  CREATININE 1.21  --  1.28*  CALCIUM 9.0  --  9.1   PT/INR No results for input(s): LABPROT, INR in the last 72 hours. ABG No results for input(s): PHART, HCO3 in the last 72 hours.  Invalid input(s): PCO2, PO2  Studies/Results: CT Renal Stone Study  Result Date: 10/15/2020 CLINICAL DATA:  Urinary tract stone, symptomatic/complicated. Ureteral stent removal earlier today, now with urinary hesitancy. EXAM: CT ABDOMEN AND PELVIS WITHOUT CONTRAST TECHNIQUE: Multidetector CT imaging of the abdomen and pelvis was performed following the standard protocol without IV contrast. COMPARISON:  10/13/2020 FINDINGS: Lower chest: Mild bibasilar  atelectasis. The visualized heart and pericardium are unremarkable. Hepatobiliary: No focal liver abnormality is seen. No gallstones, gallbladder wall thickening, or biliary dilatation. Pancreas: Unremarkable Spleen: Unremarkable Adrenals/Urinary Tract: The adrenal glands are unremarkable. The kidneys are normal in position. Mild left asymmetric cortical scarring. Since the prior examination, the bilateral ureteral stents have been removed. There has developed mild right hydronephrosis and perinephric and periureteric inflammatory stranding. Nonobstructing calculi measuring up to 8 mm calculus within the lower pole the right kidney are unchanged. No ureteral calculus. On the left, numerous nonobstructing renal calculi are again identified measuring up to 5 mm. No hydronephrosis on the left. No ureteral calculi identified. Foley catheter balloon seen within the bladder lumen which appears decompressed. Stomach/Bowel: Stomach is within normal limits. Appendix appears normal. No evidence of bowel wall thickening, distention, or inflammatory changes. No free intraperitoneal gas or fluid. Vascular/Lymphatic: No significant vascular findings are present. No enlarged abdominal or pelvic lymph nodes. Reproductive: Prostate is unremarkable. Other: Small bilateral fat containing umbilical hernias noted. Musculoskeletal: No acute bone abnormality. No lytic or blastic bone lesion. IMPRESSION: Interval removal of bilateral ureteral stents with development of mild right hydronephrosis and perinephric and periureteric inflammatory stranding. No ureteral calculus identified. This may relate to postprocedural edema, ureteral spasm, or an underlying stricture at the ureterovesicular junction. Stable bilateral nonobstructing nephrolithiasis. Electronically Signed   By: Helyn Numbers M.D.   On: 10/15/2020 04:02    Anti-infectives: Anti-infectives (  From admission, onward)    None       Assessment/Plan:  Pain from ureteral  edema. No procedureal intervention warranted. Add scheduled tyelnol today as adjunct. He will be OK for DC when pain controlled.    Devin Sanders 10/16/2020

## 2020-10-16 NOTE — Plan of Care (Signed)

## 2020-10-17 NOTE — Progress Notes (Signed)
   Subjective/Chief Complaint:   1 - Pain After Ureteroscopy - admitted through ER early AM 10/10 for pelvic pain after uncomplicated bilateral ureteroscopy to stone free on 10/12/20. No fevers. Cr 1.3, CT stone free, just mild hydro and peri-ureteral stranding as expected. Placed on scheduled ketorolac with PRN hyromorphone. Added scheduled tylenol 10/11.  Today "Devin Sanders" is improving. Pain more tolerable, but still requiring narcotics, improed with addition of tylenol. Better rest now admitted to 4th floor.   Objective: Vital signs in last 24 hours: Temp:  [97.8 F (36.6 C)-98.1 F (36.7 C)] 97.9 F (36.6 C) (10/12 2019) Pulse Rate:  [70-95] 95 (10/12 2019) Resp:  [18-22] 18 (10/12 2019) BP: (121-139)/(77-85) 139/85 (10/12 2019) SpO2:  [93 %-98 %] 97 % (10/12 2019)    Intake/Output from previous day: 10/11 0701 - 10/12 0700 In: 120 [P.O.:120] Out: 3050 [Urine:3050] Intake/Output this shift: No intake/output data recorded.   NAD, AOx3, In better spirits now on med-surg floor.  Non-labored breathing on room air RRR by monitor Mild obesity, no CVAT Clear yellow urine in bedside urinal No c/c/e  Lab Results:  Recent Labs    10/14/20 2250  WBC 13.2*  HGB 14.6  HCT 41.7  PLT 242   BMET Recent Labs    10/14/20 2250  NA 136  K 4.1  CL 103  CO2 25  GLUCOSE 136*  BUN 23*  CREATININE 1.28*  CALCIUM 9.1   PT/INR No results for input(s): LABPROT, INR in the last 72 hours. ABG No results for input(s): PHART, HCO3 in the last 72 hours.  Invalid input(s): PCO2, PO2  Studies/Results: No results found.  Anti-infectives: Anti-infectives (From admission, onward)    None       Assessment/Plan:  He will be OK for DC when pain controlled. No procedural intervention warranted. This will just take tie for edema to resolve.    [name] 10/17/2020

## 2020-10-18 MED ORDER — KETOROLAC TROMETHAMINE 10 MG PO TABS: 10.0000 mg | ORAL_TABLET | Freq: Three times a day (TID) | ORAL | 0 refills | Status: DC | PRN

## 2020-10-18 MED ORDER — HYDROMORPHONE HCL 2 MG PO TABS: 2.0000 mg | ORAL_TABLET | Freq: Four times a day (QID) | ORAL | 0 refills | Status: AC | PRN

## 2020-10-18 NOTE — Discharge Instructions (Signed)
1 - You may have urinary urgency (bladder spasms) and bloody urine on / off for up to 2 weeks.  This is normal.  2 - Call MD or go to ER for fever >102, severe pain / nausea / vomiting not relieved by medications, or acute change in medical status  

## 2020-10-18 NOTE — Progress Notes (Signed)
Patient has been taught discharge instructions and has verified that they understand teaching. IV has been removed. Patient has no further questions.

## 2020-10-18 NOTE — Discharge Summary (Signed)
Physician Discharge Summary  Patient ID: Devin Sanders MRN: [id number] DOB/AGE: 1980/06/07 40 y.o.  Admit date: 10/14/2020 Discharge date: 10/18/2020  Admission Diagnoses: Pelvic Pain after Kidney Stone Treatment  Discharge Diagnoses: same    Discharged Condition: good  Hospital Course:    1 - Pain After Ureteroscopy - admitted through ER early AM 10/10 for pelvic pain after uncomplicated bilateral ureteroscopy to stone free on 10/12/20. No fevers. Cr 1.3, CT stone free, just mild hydro and peri-ureteral stranding as expected (no fluid collections, obstructing stones). Placed on scheduled ketorolac with PRN hyromorphone. Added scheduled tylenol 10/11. By the afternoon of 10/13, the day of discharge, he is ambulatory, pain controlled, tollerating PO nutrition, having volitional voids, and felt to be adequtae for discahrge  Consults: None  Significant Diagnostic Studies: labs: as per above  Treatments: IV hydration and pain meds   Discharge Exam: Blood pressure (!) 139/98, pulse 88, temperature 97.8 F (36.6 C), resp. rate 16, height 5\' 10"  (1.778 m), weight 109.3 kg, SpO2 97 %. General appearance: alert and cooperative Eyes: negative Nose: Nares normal. Septum midline. Mucosa normal. No drainage or sinus tenderness. Throat: lips, mucosa, and tongue normal; teeth and gums normal Neck: supple, symmetrical, trachea midline Back: symmetric, no curvature. ROM normal. No CVA tenderness. Cardio: regular rate and rhythm, S1, S2 normal, no murmur, click, rub or gallop GI: soft, non-tender; bowel sounds normal; no masses,  no organomegaly Male genitalia: normal Extremities: extremities normal, atraumatic, no cyanosis or edema Lymph nodes: Cervical, supraclavicular, and axillary nodes normal. Neurologic: Grossly normal  Disposition: HOME There are no questions and answers to display.         Allergies as of 10/18/2020       Reactions   African Mango [irvingia [place]onensis]  Hives   Morphine Nausea And Vomiting   Other Hives, Swelling   Mangos        Medication List     STOP taking these medications    cephALEXin 500 MG capsule Commonly known as: KEFLEX   melatonin 5 MG Tabs   oxybutynin 5 MG tablet Commonly known as: DITROPAN   oxyCODONE-acetaminophen 5-325 MG tablet Commonly known as: Percocet   phenazopyridine 95 MG tablet Commonly known as: PYRIDIUM   tamsulosin 0.4 MG Caps capsule Commonly known as: FLOMAX       TAKE these medications    HYDROmorphone 2 MG tablet Commonly known as: Dilaudid Take 1 tablet (2 mg total) by mouth every 6 (six) hours as needed for up to 5 days for severe pain (post-operatively).   ibuprofen 200 MG tablet Commonly known as: ADVIL Take 600 mg by mouth every 6 (six) hours as needed for mild pain.   ketorolac 10 MG tablet Commonly known as: TORADOL Take 1 tablet (10 mg total) by mouth every 8 (eight) hours as needed for moderate pain.         Signed: 02-13-1986 10/18/2020, 2:09 PM

## 2021-01-25 ENCOUNTER — Emergency Department (HOSPITAL_COMMUNITY)
Admission: EM | Admit: 2021-01-25 | Discharge: 2021-01-25 | Disposition: A | Discharge status: Home / Self Care | Payer: PRIVATE HEALTH INSURANCE | Attending: Emergency Medicine | Admitting: Emergency Medicine

## 2021-01-25 ENCOUNTER — Emergency Department (HOSPITAL_COMMUNITY): Payer: PRIVATE HEALTH INSURANCE

## 2021-01-25 ENCOUNTER — Other Ambulatory Visit: Payer: Self-pay

## 2021-01-25 ENCOUNTER — Encounter (HOSPITAL_COMMUNITY): Payer: Self-pay

## 2021-01-25 DIAGNOSIS — R109 Unspecified abdominal pain: Secondary | ICD-10-CM | POA: Diagnosis present

## 2021-01-25 DIAGNOSIS — R000 Tachycardia, unspecified: Secondary | ICD-10-CM | POA: Diagnosis not present

## 2021-01-25 DIAGNOSIS — N200 Calculus of kidney: Secondary | ICD-10-CM | POA: Diagnosis not present

## 2021-01-25 DIAGNOSIS — R1031 Right lower quadrant pain: Secondary | ICD-10-CM

## 2021-01-25 LAB — CBC WITH DIFFERENTIAL/PLATELET
Abs Immature Granulocytes: 0.01 10*3/uL (ref 0.00–0.07)
Basophils Absolute: 0 10*3/uL (ref 0.0–0.1)
Basophils Relative: 0 %
Eosinophils Absolute: 0.1 10*3/uL (ref 0.0–0.5)
Eosinophils Relative: 3 %
HCT: 45 % (ref 39.0–52.0)
Hemoglobin: 15.4 g/dL (ref 13.0–17.0)
Immature Granulocytes: 0 %
Lymphocytes Relative: 13 %
Lymphs Abs: 0.6 10*3/uL — ABNORMAL LOW (ref 0.7–4.0)
MCH: 32.5 pg (ref 26.0–34.0)
MCHC: 34.2 g/dL (ref 30.0–36.0)
MCV: 94.9 fL (ref 80.0–100.0)
Monocytes Absolute: 0.4 10*3/uL (ref 0.1–1.0)
Monocytes Relative: 8 %
Neutro Abs: 3.6 10*3/uL (ref 1.7–7.7)
Neutrophils Relative %: 76 %
Platelets: 227 10*3/uL (ref 150–400)
RBC: 4.74 MIL/uL (ref 4.22–5.81)
RDW: 11.5 % (ref 11.5–15.5)
WBC: 4.6 10*3/uL (ref 4.0–10.5)
nRBC: 0 % (ref 0.0–0.2)

## 2021-01-25 LAB — COMPREHENSIVE METABOLIC PANEL
ALT: 31 U/L (ref 0–44)
AST: 29 U/L (ref 15–41)
Albumin: 4.3 g/dL (ref 3.5–5.0)
Alkaline Phosphatase: 81 U/L (ref 38–126)
Anion gap: 8 (ref 5–15)
BUN: 17 mg/dL (ref 6–20)
CO2: 23 mmol/L (ref 22–32)
Calcium: 8.5 mg/dL — ABNORMAL LOW (ref 8.9–10.3)
Chloride: 106 mmol/L (ref 98–111)
Creatinine, Ser: 0.87 mg/dL (ref 0.61–1.24)
GFR, Estimated: >60 mL/min (ref >60)
Glucose, Bld: 103 mg/dL — ABNORMAL HIGH (ref 70–99)
Potassium: 3.7 mmol/L (ref 3.5–5.1)
Sodium: 137 mmol/L (ref 135–145)
Total Bilirubin: 1.1 mg/dL (ref 0.3–1.2)
Total Protein: 7.8 g/dL (ref 6.5–8.1)

## 2021-01-25 LAB — URINALYSIS, ROUTINE W REFLEX MICROSCOPIC
Bilirubin Urine: NEGATIVE
Glucose, UA: NEGATIVE mg/dL
Hgb urine dipstick: NEGATIVE
Ketones, ur: NEGATIVE mg/dL
Leukocytes,Ua: NEGATIVE
Nitrite: NEGATIVE
Protein, ur: NEGATIVE mg/dL
Specific Gravity, Urine: 1.025 (ref 1.005–1.030)
pH: 5 (ref 5.0–8.0)

## 2021-01-25 LAB — LIPASE, BLOOD: Lipase: 95 U/L — ABNORMAL HIGH (ref 11–51)

## 2021-01-25 MED ORDER — DIPHENHYDRAMINE HCL 50 MG/ML IJ SOLN
12.5000 mg | Freq: Once | INTRAMUSCULAR | Status: AC
  Administered 2021-01-25: 12.5 mg via INTRAVENOUS
  Filled 2021-01-25: qty 1

## 2021-01-25 MED ORDER — KETOROLAC TROMETHAMINE 30 MG/ML IJ SOLN
30.0000 mg | Freq: Once | INTRAMUSCULAR | Status: AC
  Administered 2021-01-25: 30 mg via INTRAVENOUS
  Filled 2021-01-25: qty 1

## 2021-01-25 MED ORDER — HYDROCODONE-ACETAMINOPHEN 5-325 MG PO TABS: 2.0000 | ORAL_TABLET | Freq: Four times a day (QID) | ORAL | 0 refills | Status: DC | PRN

## 2021-01-25 MED ORDER — METOCLOPRAMIDE HCL 5 MG/ML IJ SOLN
10.0000 mg | Freq: Once | INTRAMUSCULAR | Status: AC
Start: 2021-01-25 — End: 2021-01-25
  Administered 2021-01-25: 10 mg via INTRAVENOUS
  Filled 2021-01-25: qty 2

## 2021-01-25 MED ORDER — IBUPROFEN 400 MG PO TABS
400.0000 mg | ORAL_TABLET | Freq: Three times a day (TID) | ORAL | 0 refills | Status: DC
Start: 2021-01-25 — End: 2021-02-20

## 2021-01-25 MED ORDER — HYDROMORPHONE HCL 1 MG/ML IJ SOLN
1.0000 mg | Freq: Once | INTRAMUSCULAR | Status: AC
  Administered 2021-01-25: 1 mg via INTRAVENOUS
  Filled 2021-01-25: qty 1

## 2021-01-25 MED ORDER — DIPHENHYDRAMINE HCL 50 MG/ML IJ SOLN
12.5000 mg | INTRAMUSCULAR | Status: DC | PRN
  Administered 2021-01-25: 12.5 mg via INTRAVENOUS
  Filled 2021-01-25: qty 1

## 2021-01-25 MED ORDER — SODIUM CHLORIDE 0.9 % IV BOLUS
1000.0000 mL | Freq: Once | INTRAVENOUS | Status: AC
  Administered 2021-01-25: 1000 mL via INTRAVENOUS

## 2021-01-25 NOTE — ED Provider Notes (Signed)
[facility] DEPT Provider Note   CSN: [id number] Arrival date & time: 01/25/21  [id number]     History  Chief Complaint  Patient presents with   Flank Pain   Abdominal Pain    Devin Sanders is a well-appearing 41 y.o. 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  This has been accompanied with nausea and 10 episodes of vomiting.  Pressure, activities, sitting in the wrong position make the pain worse.  Pain is rated 10 out of 10 described as sharp, waxing and waning.  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  He usually tries to pass the stones at home and only comes in when the pain is severe and/or he cannot stop vomiting.  He has been able to pass 2 to 3 stones and brought them in a bag to the ER.  He had a recent sinus infection last week, but is now left with resolving nasal congestion.  Denies discoloration of urine, urinary frequency, or changes in bowel habits.  Denies fever, shortness of breath, chest pain, numbness, light-headedness, dizziness.  He was last seen in October 2022 for similar presentation in which stones were identified.  The history is provided by the patient and medical records.  Flank Pain Associated symptoms include abdominal pain. Pertinent negatives include no chest pain, no headaches and no shortness of breath.  Abdominal Pain Associated symptoms: nausea and vomiting   Associated symptoms: no chest pain, no chills, no constipation, no cough, no diarrhea, no dysuria, no fever, no hematuria, no shortness of breath and no sore throat       Home Medications Prior to Admission medications   Medication Sig Start Date End Date Taking? Authorizing Provider  HYDROcodone-acetaminophen (NORCO/VICODIN) 5-325 MG  tablet Take 2 tablets by mouth every 6 (six) hours as needed. 01/25/21  Yes Prince Rome, PA-C  ibuprofen (ADVIL) 400 MG tablet Take 1 tablet (400 mg total) by mouth 3 (three) times daily. Take with food. 01/25/21  Yes Prince Rome, PA-C  ketorolac (TORADOL) 10 MG tablet Take 1 tablet (10 mg total) by mouth every 8 (eight) hours as needed for moderate pain. 10/18/20   Alexis Frock, MD      Allergies    African mango Geroge Baseman gabonensis], Morphine, and Other    Review of Systems   Review of Systems  Constitutional:  Negative for appetite change, chills, diaphoresis and fever.  HENT:  Positive for congestion. Negative for ear pain, rhinorrhea, sinus pressure, sinus pain and sore throat.   Eyes:  Negative for pain and visual disturbance.  Respiratory:  Negative for cough and shortness of breath.   Cardiovascular:  Negative for chest pain and palpitations.  Gastrointestinal:  Positive for abdominal pain, nausea and vomiting. Negative for constipation and diarrhea.  Genitourinary:  Positive for flank pain. Negative for dysuria, frequency, hematuria and urgency.  Musculoskeletal:  Negative for arthralgias, back pain, neck pain and neck stiffness.  Skin:  Negative for color change and rash.  Neurological:  Negative for seizures, syncope, light-headedness, numbness and headaches.  Psychiatric/Behavioral:  The patient is not nervous/anxious.   All other systems reviewed and are negative.  Physical Exam Updated Vital Signs BP (!) 143/109 (BP Location: Left Arm)    Pulse (!) 130  Temp 98.4 F (36.9 C) (Oral)    Resp 16    Ht 5\' 10"  (1.778 m)    Wt 104.3 kg    SpO2 98%    BMI 33.00 kg/m  Physical Exam Vitals and nursing note reviewed.  Constitutional:      General: He is not in acute distress.    Appearance: He is well-developed. He is not ill-appearing or diaphoretic.  HENT:     Head: Normocephalic and atraumatic.  Eyes:     General: No scleral icterus.     Conjunctiva/sclera: Conjunctivae normal.  Cardiovascular:     Rate and Rhythm: Regular rhythm. Tachycardia present.     Heart sounds: Normal heart sounds. No murmur heard. Pulmonary:     Effort: Pulmonary effort is normal. No respiratory distress.     Breath sounds: Normal breath sounds.  Abdominal:     General: Abdomen is protuberant. There is no distension.     Palpations: Abdomen is soft. There is no hepatomegaly or splenomegaly.     Tenderness: There is abdominal tenderness in the right lower quadrant. There is right CVA tenderness and left CVA tenderness. There is no guarding or rebound. Negative signs include Murphy's sign and McBurney's sign.  Musculoskeletal:        General: No swelling.     Cervical back: Neck supple.  Skin:    General: Skin is warm and dry.     Capillary Refill: Capillary refill takes less than 2 seconds.     Coloration: Skin is not cyanotic or jaundiced.  Neurological:     Mental Status: He is alert and oriented to person, place, and time.  Psychiatric:        Mood and Affect: Mood normal. Mood is not anxious.    ED Results / Procedures / Treatments   Labs (all labs ordered are listed, but only abnormal results are displayed) Labs Reviewed  CBC WITH DIFFERENTIAL/PLATELET - Abnormal; Notable for the following components:      Result Value   Lymphs Abs 0.6 (*)    All other components within normal limits  COMPREHENSIVE METABOLIC PANEL - Abnormal; Notable for the following components:   Glucose, Bld 103 (*)    Calcium 8.5 (*)    All other components within normal limits  LIPASE, BLOOD - Abnormal; Notable for the following components:   Lipase 95 (*)    All other components within normal limits  URINALYSIS, ROUTINE W REFLEX MICROSCOPIC    EKG None  Radiology CT Renal Stone Study  Result Date: 01/25/2021 CLINICAL DATA:  Left flank pain for 2 days. EXAM: CT ABDOMEN AND PELVIS WITHOUT CONTRAST TECHNIQUE: Multidetector CT imaging of the abdomen and  pelvis was performed following the standard protocol without IV contrast. RADIATION DOSE REDUCTION: This exam was performed according to the departmental dose-optimization program which includes automated exposure control, adjustment of the mA and/or kV according to patient size and/or use of iterative reconstruction technique. COMPARISON:  October 15, 2020, June 10, 2017, September 22, 2020 FINDINGS: Lower chest: Dependent atelectasis of posterior lung bases are identified. The heart size is normal. Hepatobiliary: No focal liver abnormality is seen. No gallstones, gallbladder wall thickening, or biliary dilatation. Pancreas: Heterogeneous fatty replacement of the pancreas with relative stair of the pale unchanged compared with prior CT scan of June 10, 2017. The pancreas is otherwise unremarkable. Spleen: Normal in size without focal abnormality. Adrenals/Urinary Tract: The bilateral adrenal glands are normal. Small nonobstructing stones are identified in bilateral kidneys, largest stone  in the midpole left kidney measuring 7 mm. There is no hydronephrosis or ureteral stones bilaterally. Subtle focal low-density is identified in the lateral midpole right kidney unchanged compared to multiple prior exams including September 23, 2018. Bladder is normal. Stomach/Bowel: Stomach is within normal limits. Appendix appears normal. No evidence of bowel wall thickening, distention, or inflammatory changes. Vascular/Lymphatic: No significant vascular findings are present. No enlarged abdominal or pelvic lymph nodes. Reproductive: Prostate is unremarkable. Other: Focal fatty herniation of mesenteric fat is identified in the bilateral inguinal canal. Musculoskeletal: No acute or significant osseous findings. IMPRESSION: Small nonobstructing stones are identified in bilateral kidneys, largest stone in the midpole left kidney measuring 7 mm. No hydronephrosis or ureteral stones bilaterally. Electronically Signed   By: Abelardo Diesel  M.D.   On: 01/25/2021 10:04    Procedures Procedures    Medications Ordered in ED Medications  diphenhydrAMINE (BENADRYL) injection 12.5 mg (12.5 mg Intravenous Given 01/25/21 1110)  HYDROmorphone (DILAUDID) injection 1 mg (1 mg Intravenous Given 01/25/21 0930)  metoCLOPramide (REGLAN) injection 10 mg (10 mg Intravenous Given 01/25/21 0930)  diphenhydrAMINE (BENADRYL) injection 12.5 mg (12.5 mg Intravenous Given 01/25/21 0931)  HYDROmorphone (DILAUDID) injection 1 mg (1 mg Intravenous Given 01/25/21 1109)  sodium chloride 0.9 % bolus 1,000 mL (0 mLs Intravenous Stopped 01/25/21 1339)  ketorolac (TORADOL) 30 MG/ML injection 30 mg (30 mg Intravenous Given 01/25/21 1201)    ED Course/ Medical Decision Making/ A&P                           Medical Decision Making Amount and/or Complexity of Data Reviewed Labs: ordered. Decision-making details documented in ED Course. Radiology: ordered and independent interpretation performed. Decision-making details documented in ED Course. ECG/medicine tests: ordered and independent interpretation performed. Decision-making details documented in ED Course.  Risk Prescription drug management.   41 year old male with a history of cystinuria presents to the ED for concern of right abdominal pain and left flank pain.  This involves an extensive number of treatment options, and is a complaint that carries with it a high risk of complications and morbidity.  The differential diagnosis includes renal calculi, hydronephrosis, renal cyst, pyelonephritis, acute cystitis, pancreatitis.  Comorbidities that complicate the patient evaluation include history of cystinuria.  Additional history obtained from internal records within epic such as previous ED visits.  I ordered, and personally interpreted labs.  The pertinent results include an unremarkable urinalysis, which likely be abnormal in UTI, acute cystitis, or pyelonephritis.  The patient has a positive CVA  tenderness bilaterally, WBC count was not elevated and patient is afebrile, unlikely pyelonephritis at this time.  Patient's hemoglobin and hematocrit are within normal range and patient denies hematuria, I do not believe he is anemic.  Patient exhibits vomiting and back pain, but patient is not jaundiced, stool and urine have not changed color, total bilirubin is normal at 1.1, and lipase is very mildly elevated, so unlikely pancreatitis.    I ordered imaging studies including CT renal stone study.  I independently visualized and interpreted imaging which showed small nonobstructing stones in the kidneys bilaterally with the largest stone measuring 7 mm.  No evidence of hydronephrosis or ureteral stones bilaterally.  I agree with the radiologist interpretation  I ordered medication including Dilaudid, metoclopramide, and Benadryl for pain, nausea, vomiting.  Reevaluation of the patient after these medicines showed that the patient's heart rate and blood pressure have lowered patient now rates pain as a 7  out of 10.  Patient quested more assistance with pain relief.  Provided another dose of Dilaudid.  Also provided Toradol to reduce inflammation in the affected areas.  I have reviewed the patients home medicines and have made adjustments as needed.  Consider providing antibiotic, but given that the patient is afebrile, not tachycardic, WBC is not elevated, and UA is unremarkable I do not believe this is necessary at this time  After the interventions noted above, I reevaluated the patient and found that his pain is now minimal and he has been able to provide urine.  This along with imaging results does not suggest obstruction.  Still denies dysuria or hematuria and UA again was unremarkable.    Dispostion: After consideration of the diagnostic results and the patients response to treatment, I feel that the patent would benefit from outpatient follow-up with urology at this time.  Patient vital stable and  patient expresses desire to be discharged and go home.  Provided patient with anti-inflammatories to hold him over until he can see urology.  Discussed course of treatment thoroughly with the patient and he demonstrated understanding.  Patient in agreement and has no further questions.        Final Clinical Impression(s) / ED Diagnoses Final diagnoses:  Kidney stone  Right lower quadrant abdominal pain  Left flank pain    Rx / DC Orders ED Discharge Orders          Ordered    ibuprofen (ADVIL) 400 MG tablet  3 times daily        01/25/21 1328    HYDROcodone-acetaminophen (NORCO/VICODIN) 5-325 MG tablet  Every 6 hours PRN        01/25/21 1328              Prince Rome, [place] [id number]    Carmin Muskrat, MD 01/29/21 2351

## 2021-01-25 NOTE — Discharge Instructions (Addendum)
Take 1 tablet of 400 mg ibuprofen times per day with food for maximum of 5 days.  Only take the Norco for breakthrough pain every 6-8 hours as needed.  Stop using the Norco if experiencing itching, rash, anaphylaxis.  Use Benadryl if needed with the Norco as previously tolerated.  Follow-up with Dr. Urban Gibson of urology within the next 48 to 72 hours.  Turn to the ED for worsening symptoms including but not limited to fever, shortness of breath, chest pain, severe abdominal pain, dizziness.

## 2021-01-25 NOTE — ED Triage Notes (Signed)
Patient c/o left flank pain and right lower abdominal pain. Patient has some kidney stones in a baggie and states he has more that he has been trying to pass x 2 days.

## 2021-02-01 ENCOUNTER — Other Ambulatory Visit: Payer: Self-pay | Admitting: Urology

## 2021-02-05 ENCOUNTER — Other Ambulatory Visit: Payer: Self-pay

## 2021-02-05 ENCOUNTER — Encounter (HOSPITAL_COMMUNITY): Payer: Self-pay

## 2021-02-05 DIAGNOSIS — Z01812 Encounter for preprocedural laboratory examination: Secondary | ICD-10-CM | POA: Diagnosis present

## 2021-02-05 NOTE — Progress Notes (Addendum)
Anesthesia Review:  PCP: [facility]  Cardiologist : none  Chest x-ray : EKG : Echo : Stress test: Cardiac Cath :  Activity level: can do a flgiht of stairs without difficulty  Sleep Study/ CPAP : none  Fasting Blood Sugar :      / Checks Blood Sugar -- times a day:   Blood Thinner/ Instructions /Last Dose: ASA / Instructions/ Last Dose :   01/25/21- CBC and CMP- in epic  IN ED 01/25/21.  Hx of multiple cystopies.  Last 10/12/2020.

## 2021-02-07 ENCOUNTER — Encounter (HOSPITAL_COMMUNITY)
Admission: RE | Admit: 2021-02-07 | Discharge: 2021-02-07 | Disposition: A | Discharge status: Home / Self Care | Payer: No Typology Code available for payment source | Source: Ambulatory Visit | Attending: Urology | Admitting: Urology

## 2021-02-07 DIAGNOSIS — Z01812 Encounter for preprocedural laboratory examination: Secondary | ICD-10-CM | POA: Diagnosis not present

## 2021-02-14 ENCOUNTER — Emergency Department (HOSPITAL_COMMUNITY)
Admission: EM | Admit: 2021-02-14 | Discharge: 2021-02-14 | Disposition: A | Discharge status: Home / Self Care | Payer: PRIVATE HEALTH INSURANCE | Attending: Emergency Medicine | Admitting: Emergency Medicine

## 2021-02-14 ENCOUNTER — Encounter (HOSPITAL_COMMUNITY): Payer: Self-pay

## 2021-02-14 ENCOUNTER — Other Ambulatory Visit: Payer: Self-pay

## 2021-02-14 ENCOUNTER — Emergency Department (HOSPITAL_COMMUNITY): Payer: PRIVATE HEALTH INSURANCE

## 2021-02-14 DIAGNOSIS — N200 Calculus of kidney: Secondary | ICD-10-CM | POA: Diagnosis not present

## 2021-02-14 DIAGNOSIS — R109 Unspecified abdominal pain: Secondary | ICD-10-CM

## 2021-02-14 LAB — CBC
HCT: 42.2 % (ref 39.0–52.0)
Hemoglobin: 14.4 g/dL (ref 13.0–17.0)
MCH: 32.5 pg (ref 26.0–34.0)
MCHC: 34.1 g/dL (ref 30.0–36.0)
MCV: 95.3 fL (ref 80.0–100.0)
Platelets: 294 10*3/uL (ref 150–400)
RBC: 4.43 MIL/uL (ref 4.22–5.81)
RDW: 11.8 % (ref 11.5–15.5)
WBC: 6.4 10*3/uL (ref 4.0–10.5)
nRBC: 0 % (ref 0.0–0.2)

## 2021-02-14 LAB — BASIC METABOLIC PANEL
Anion gap: 7 (ref 5–15)
BUN: 21 mg/dL — ABNORMAL HIGH (ref 6–20)
CO2: 25 mmol/L (ref 22–32)
Calcium: 9.2 mg/dL (ref 8.9–10.3)
Chloride: 105 mmol/L (ref 98–111)
Creatinine, Ser: 1.09 mg/dL (ref 0.61–1.24)
GFR, Estimated: >60 mL/min (ref >60)
Glucose, Bld: 96 mg/dL (ref 70–99)
Potassium: 3.8 mmol/L (ref 3.5–5.1)
Sodium: 137 mmol/L (ref 135–145)

## 2021-02-14 MED ORDER — SODIUM CHLORIDE 0.9 % IV SOLN: INTRAVENOUS | Status: DC

## 2021-02-14 MED ORDER — ONDANSETRON HCL 4 MG/2ML IJ SOLN
4.0000 mg | Freq: Once | INTRAMUSCULAR | Status: AC
  Administered 2021-02-14: 4 mg via INTRAVENOUS
  Filled 2021-02-14: qty 2

## 2021-02-14 MED ORDER — FENTANYL CITRATE PF 50 MCG/ML IJ SOSY
100.0000 ug | PREFILLED_SYRINGE | INTRAMUSCULAR | Status: DC | PRN
  Administered 2021-02-14 (×3): 100 ug via INTRAVENOUS
  Filled 2021-02-14 (×3): qty 2

## 2021-02-14 MED ORDER — DIPHENHYDRAMINE HCL 50 MG/ML IJ SOLN
12.5000 mg | Freq: Once | INTRAMUSCULAR | Status: AC
  Administered 2021-02-14: 12.5 mg via INTRAVENOUS
  Filled 2021-02-14: qty 1

## 2021-02-14 NOTE — Anesthesia Preprocedure Evaluation (Addendum)
Anesthesia Evaluation  Patient identified by MRN, date of birth, ID band Patient awake    Reviewed: Allergy & Precautions, NPO status , Patient's Chart, lab work & pertinent test results  Airway Mallampati: II  TM Distance: >3 FB Neck ROM: Full    Dental no notable dental hx. (+) Dental Advisory Given, Teeth Intact   Pulmonary neg pulmonary ROS,    Pulmonary exam normal breath sounds clear to auscultation       Cardiovascular negative cardio ROS Normal cardiovascular exam Rhythm:Regular Rate:Normal     Neuro/Psych negative neurological ROS  negative psych ROS   GI/Hepatic negative GI ROS, Neg liver ROS, GERD  ,  Endo/Other  negative endocrine ROS  Renal/GU Renal diseasenegative Renal ROS  negative genitourinary   Musculoskeletal negative musculoskeletal ROS (+)   Abdominal (+) + obese,   Peds negative pediatric ROS (+)  Hematology negative hematology ROS (+)   Anesthesia Other Findings   Reproductive/Obstetrics negative OB ROS                            Anesthesia Physical  Anesthesia Plan  ASA: 2  Anesthesia Plan: General   Post-op Pain Management:    Induction: Intravenous  PONV Risk Score and Plan: 3 and Ondansetron, Dexamethasone, Treatment may vary due to age or medical condition and Midazolam  Airway Management Planned: LMA  Additional Equipment: None  Intra-op Plan:   Post-operative Plan: Extubation in OR  Informed Consent: I have reviewed the patients History and Physical, chart, labs and discussed the procedure including the risks, benefits and alternatives for the proposed anesthesia with the patient or authorized representative who has indicated his/her understanding and acceptance.     Dental advisory given  Plan Discussed with: CRNA and Anesthesiologist  Anesthesia Plan Comments:         Anesthesia Quick Evaluation

## 2021-02-14 NOTE — ED Provider Notes (Signed)
[facility] DEPT Provider Note   CSN: [id number] Arrival date & time: 02/14/21  1525     History  Chief Complaint  Patient presents with   Flank Pain    Devin Sanders is a 41 y.o. male.  He presents for evaluation of left flank pain, and left abdominal pain.  This has been associated with vomiting.  He has known kidney stones and is due to have cystoscopy with stent placement, tomorrow morning.  He denies blood in the emesis, diarrhea, fever, chills or hematuria at this time.  He has been unable to tolerate his oral pain medicine.     Home Medications Prior to Admission medications   Medication Sig Start Date End Date Taking? Authorizing Provider  ibuprofen (ADVIL) 400 MG tablet Take 1 tablet (400 mg total) by mouth 3 (three) times daily. Take with food. Patient not taking: Reported on 02/04/2021 [id number]   Prince Rome, PA-C  ketorolac (TORADOL) 10 MG tablet Take 1 tablet (10 mg total) by mouth every 8 (eight) hours as needed for moderate pain. Patient not taking: Reported on 02/04/2021 10/18/20   Alexis Frock, MD  PERCOCET 5-325 MG tablet Take 1-2 tablets by mouth every 6 (six) hours as needed for pain (Kindey stones). 01/29/21   [provider]  tamsulosin (FLOMAX) 0.4 MG CAPS capsule Take 0.4 mg by mouth daily as needed (Kidney stones).    [provider]      Allergies    Morphine and Other    Review of Systems   Review of Systems  Physical Exam Updated Vital Signs BP 131/76    Pulse 79    Temp 98 F (36.7 C) (Oral)    Resp 18    Ht 5\' 10"  (1.778 m)    Wt 104.3 kg    SpO2 98%    BMI 33.00 kg/m  Physical Exam Vitals and nursing note reviewed.  Constitutional:      General: He is not in acute distress.    Appearance: He is well-developed. He is not ill-appearing or diaphoretic.  HENT:     Head: Normocephalic and atraumatic.     Right Ear: External ear normal.     Left Ear: External ear normal.  Eyes:      Conjunctiva/sclera: Conjunctivae normal.     Pupils: Pupils are equal, round, and reactive to light.  Neck:     Trachea: Phonation normal.  Cardiovascular:     Rate and Rhythm: Normal rate and regular rhythm.     Heart sounds: Normal heart sounds.  Pulmonary:     Effort: Pulmonary effort is normal.     Breath sounds: Normal breath sounds.  Abdominal:     General: There is no distension.     Palpations: Abdomen is soft.     Tenderness: There is no abdominal tenderness.  Musculoskeletal:        General: Normal range of motion.     Cervical back: Normal range of motion and neck supple.  Skin:    General: Skin is warm and dry.  Neurological:     Mental Status: He is alert and oriented to person, place, and time.     Cranial Nerves: No cranial nerve deficit.     Sensory: No sensory deficit.     Motor: No abnormal muscle tone.     Coordination: Coordination normal.  Psychiatric:        Behavior: Behavior normal.        Thought Content:  Thought content normal.        Judgment: Judgment normal.    ED Results / Procedures / Treatments   Labs (all labs ordered are listed, but only abnormal results are displayed) Labs Reviewed  BASIC METABOLIC PANEL - Abnormal; Notable for the following components:      Result Value   BUN 21 (*)    All other components within normal limits  CBC    EKG None  Radiology DG C-Arm 1-60 Min-No Report  Result Date: 02/15/2021 Fluoroscopy was utilized by the requesting physician.  No radiographic interpretation.   CT Renal Stone Study  Result Date: 02/14/2021 CLINICAL DATA:  Left-sided flank pain with nausea and vomiting EXAM: CT ABDOMEN AND PELVIS WITHOUT CONTRAST TECHNIQUE: Multidetector CT imaging of the abdomen and pelvis was performed following the standard protocol without IV contrast. RADIATION DOSE REDUCTION: This exam was performed according to the departmental dose-optimization program which includes automated exposure control, adjustment of  the mA and/or kV according to patient size and/or use of iterative reconstruction technique. COMPARISON:  Multiple priors including most recent CT January 25, 2021 FINDINGS: Lower chest: No acute abnormality. Hepatobiliary: Unremarkable noncontrast appearance of the hepatic parenchyma. Gallbladder is unremarkable. No biliary ductal dilation. Pancreas: No pancreatic ductal dilation or evidence of acute inflammation. Spleen: Within normal limits. Adrenals/Urinary Tract: Bilateral adrenal glands appear normal. No hydronephrosis. Single punctate right upper pole nephrolithiasis. Numerous left-sided nephrolithiasis measuring up to 5 mm. No obstructive ureteral or bladder calculi identified. Urinary bladder is decompressed limiting evaluation. Stomach/Bowel: No radiopaque enteric contrast was administered. Stomach is unremarkable for degree of distension. No pathologic dilation of small or large bowel. The appendix and terminal ileum appear normal. No evidence of acute bowel inflammation. Vascular/Lymphatic: Normal caliber aorta. No pathologically enlarged abdominal or pelvic lymph nodes. Reproductive: Prostate is unremarkable. Other: No abdominopelvic free fluid. Musculoskeletal: Minimal spondylosis.  No acute osseous abnormality. IMPRESSION: 1. Bilateral nephrolithiasis, similar prior. No obstructive ureteral or bladder calculi identified. 2. No acute findings within the abdomen or pelvis. Electronically Signed   By: Dahlia Bailiff M.D.   On: 02/14/2021 16:22    Procedures Procedures    Medications Ordered in ED Medications  ondansetron (ZOFRAN) injection 4 mg (4 mg Intravenous Given 02/14/21 1617)  diphenhydrAMINE (BENADRYL) injection 12.5 mg (12.5 mg Intravenous Given 02/14/21 1929)    ED Course/ Medical Decision Making/ A&P Clinical Course as of 02/15/21 1303  Thu Feb 14, 2021  1930 Case discussed with urology, Dr. Abner Greenspan, he states that since the patient has no signs of infection, he can be treated  symptomatically with continuation of plans for cystoscopy and stent placement tomorrow.   [EW]    Clinical Course User Index [EW] Daleen Bo, MD                             Patient Vitals for the past 24 hrs:  BP Temp Temp src Pulse Resp SpO2 Height Weight  02/14/21 1945 131/76 -- -- 79 18 98 % -- --  02/14/21 1915 (!) 135/98 -- -- 68 18 95 % -- --  02/14/21 1818 135/87 98 F (36.7 C) Oral 73 17 97 % -- --  02/14/21 1745 130/82 -- -- 70 18 94 % -- --  02/14/21 1600 -- -- -- -- -- -- 5\' 10"  (1.778 m) 104.3 kg  02/14/21 1533 (!) 146/109 (!) 97.5 F (36.4 C) Oral (!) 101 16 94 % -- --  Medical Decision Making: Summary of Illness/Injury: Patient with known kidney stones, vomiting, unable to tolerate pain medicine, due to have ureteral stenting, to treat chronic stones, tomorrow morning  Critical Interventions-laboratory testing, CT imaging; to evaluate  Chief Complaint  Patient presents with   Flank Pain    and assess for illness characterized as Acute and Chronic   The Differential Diagnoses include obstructing ureteral stones, acute infection, renal insufficiency.  I did not require  Additional Historical Information from Anyone, as the patient is a good historian.  I decided to review pertinent External Data, and in summary prior CT images and laboratory tests in the EMR.   Clinical Laboratory Tests Ordered, included CBC and Metabolic panel. Review indicates essentially normal. Emergent testing abnormality management required for stabilization-no  Radiologic Tests Ordered, included CT abdomen pelvis.  I independently Visualized: CT images, which show renal stones without ureter abnormalities or obstruction   This patient is Presenting for Evaluation of flank and abdominal pain, which does require a range of treatment options, and is a complaint that involves a moderate risk of morbidity and mortality.  Pharmaceutical Risk Management IV medications to control nausea and  pain Prescription Management   After These Interventions, the Patient was reevaluated and was found pain and vomiting controlled  Discussion of Management or Test Interpretation With External Provider(s): Discussed with on-call urologist Dr. Abner Greenspan who states patient can be discharged with current plan in place for ureteroscopy and stenting tomorrow  Surgical RiskMinor Without Identified Risk                Final Clinical Impression(s) / ED Diagnoses Final diagnoses:  Flank pain  Nephrolithiasis    Rx / DC Orders ED Discharge Orders     None         Daleen Bo, MD 02/15/21 1310

## 2021-02-14 NOTE — ED Notes (Addendum)
An After Visit Summary was printed and given to the patient. Discharge instructions given and no further questions at this time.  Pt states wife is taking him home.  

## 2021-02-14 NOTE — ED Provider Triage Note (Signed)
Emergency Medicine Provider Triage Evaluation Note  LINARD MCGARRITY , a 41 y.o. male  was evaluated in triage.  Pt complains of worsening left-sided flank pain, nausea, vomiting, inability to tolerate p.o. intake since yesterday.  Patient has history of kidney stone for which he has a procedure scheduled for tomorrow morning.  Patient states he is unable to keep down his pain medication.  States he has been passing fragments of stones today.  Review of Systems  Positive: As above Negative: As above  Physical Exam  BP (!) 146/109 (BP Location: Left Arm)    Pulse (!) 101    Temp (!) 97.5 F (36.4 C) (Oral)    Resp 16    SpO2 94%  Gen:   Awake, no distress   Resp:  Normal effort  MSK:   Moves extremities without difficulty  Other:  Left flank tenderness and left quadrant abdominal pain present.  Medical Decision Making  Medically screening exam initiated at 3:41 PM.  Appropriate orders placed.  Lanette Hampshire was informed that the remainder of the evaluation will be completed by another provider, this initial triage assessment does not replace that evaluation, and the importance of remaining in the ED until their evaluation is complete.     Evlyn Courier, PA-C 02/14/21 1542

## 2021-02-14 NOTE — Discharge Instructions (Signed)
Follow-up tomorrow for cystoscopy as scheduled.

## 2021-02-14 NOTE — ED Notes (Signed)
Patient transported to CT 

## 2021-02-14 NOTE — ED Triage Notes (Signed)
Pt presents with c/o left flank pain. Pt reports he has diagnosed kidney stones on the left side and is having surgery tomorrow to have them removed but the pain is not bearable.

## 2021-02-15 ENCOUNTER — Ambulatory Visit (HOSPITAL_COMMUNITY): Payer: PRIVATE HEALTH INSURANCE

## 2021-02-15 ENCOUNTER — Observation Stay (HOSPITAL_COMMUNITY)
Admission: RE | Admit: 2021-02-15 | Discharge: 2021-02-16 | Disposition: A | Discharge status: Home / Self Care | Payer: PRIVATE HEALTH INSURANCE | Attending: Urology | Admitting: Urology

## 2021-02-15 ENCOUNTER — Encounter (HOSPITAL_COMMUNITY): Payer: Self-pay | Admitting: Urology

## 2021-02-15 ENCOUNTER — Encounter (HOSPITAL_COMMUNITY)
Admission: RE | Disposition: A | Discharge status: Home / Self Care | Payer: Self-pay | Source: Home / Self Care | Attending: Urology

## 2021-02-15 ENCOUNTER — Ambulatory Visit (HOSPITAL_COMMUNITY): Payer: PRIVATE HEALTH INSURANCE | Admitting: Anesthesiology

## 2021-02-15 ENCOUNTER — Ambulatory Visit (HOSPITAL_BASED_OUTPATIENT_CLINIC_OR_DEPARTMENT_OTHER): Payer: PRIVATE HEALTH INSURANCE | Admitting: Anesthesiology

## 2021-02-15 DIAGNOSIS — N200 Calculus of kidney: Principal | ICD-10-CM

## 2021-02-15 DIAGNOSIS — Z8616 Personal history of COVID-19: Secondary | ICD-10-CM | POA: Diagnosis not present

## 2021-02-15 DIAGNOSIS — E7201 Cystinuria: Secondary | ICD-10-CM | POA: Insufficient documentation

## 2021-02-15 HISTORY — PX: HOLMIUM LASER APPLICATION: SHX5852

## 2021-02-15 HISTORY — PX: CYSTOSCOPY WITH RETROGRADE PYELOGRAM, URETEROSCOPY AND STENT PLACEMENT: SHX5789

## 2021-02-15 SURGERY — CYSTOURETEROSCOPY, WITH RETROGRADE PYELOGRAM AND STENT INSERTION
Anesthesia: General | Laterality: Bilateral

## 2021-02-15 MED ORDER — OXYCODONE HCL 5 MG PO TABS
ORAL_TABLET | ORAL | Status: AC
  Administered 2021-02-15: 5 mg
  Filled 2021-02-15: qty 1

## 2021-02-15 MED ORDER — FENTANYL CITRATE PF 50 MCG/ML IJ SOSY
PREFILLED_SYRINGE | INTRAMUSCULAR | Status: AC
  Administered 2021-02-15: 100 ug via INTRAVENOUS
  Filled 2021-02-15: qty 2

## 2021-02-15 MED ORDER — LACTATED RINGERS IV SOLN: INTRAVENOUS | Status: DC

## 2021-02-15 MED ORDER — DIPHENHYDRAMINE HCL 50 MG/ML IJ SOLN
25.0000 mg | Freq: Once | INTRAMUSCULAR | Status: AC
  Administered 2021-02-15: 25 mg via INTRAVENOUS

## 2021-02-15 MED ORDER — GENTAMICIN SULFATE 40 MG/ML IJ SOLN
5.0000 mg/kg | INTRAVENOUS | Status: AC
  Administered 2021-02-15: 430 mg via INTRAVENOUS
  Filled 2021-02-15: qty 10.75

## 2021-02-15 MED ORDER — SODIUM CHLORIDE 0.9 % IV SOLN: INTRAVENOUS | Status: DC

## 2021-02-15 MED ORDER — DIPHENHYDRAMINE HCL 50 MG/ML IJ SOLN
INTRAMUSCULAR | Status: AC
  Filled 2021-02-15: qty 1

## 2021-02-15 MED ORDER — MIDAZOLAM HCL 2 MG/2ML IJ SOLN
INTRAMUSCULAR | Status: AC
  Filled 2021-02-15: qty 2

## 2021-02-15 MED ORDER — DEXAMETHASONE SODIUM PHOSPHATE 10 MG/ML IJ SOLN
INTRAMUSCULAR | Status: DC | PRN
  Administered 2021-02-15: 10 mg via INTRAVENOUS

## 2021-02-15 MED ORDER — KETOROLAC TROMETHAMINE 10 MG PO TABS: 10.0000 mg | ORAL_TABLET | Freq: Three times a day (TID) | ORAL | 0 refills | Status: DC | PRN

## 2021-02-15 MED ORDER — HYDROMORPHONE HCL 1 MG/ML IJ SOLN
INTRAMUSCULAR | Status: AC
  Administered 2021-02-15: 0.5 mg via INTRAVENOUS
  Filled 2021-02-15: qty 1

## 2021-02-15 MED ORDER — PROPOFOL 10 MG/ML IV BOLUS
INTRAVENOUS | Status: DC | PRN
  Administered 2021-02-15: 200 mg via INTRAVENOUS

## 2021-02-15 MED ORDER — SENNOSIDES-DOCUSATE SODIUM 8.6-50 MG PO TABS
1.0000 | ORAL_TABLET | Freq: Two times a day (BID) | ORAL | Status: DC
  Administered 2021-02-15 – 2021-02-16 (×3): 1 via ORAL
  Filled 2021-02-15 (×3): qty 1

## 2021-02-15 MED ORDER — ACETAMINOPHEN 325 MG PO TABS: 325.0000 mg | ORAL_TABLET | ORAL | Status: DC | PRN

## 2021-02-15 MED ORDER — MIDAZOLAM HCL 5 MG/5ML IJ SOLN
INTRAMUSCULAR | Status: DC | PRN
  Administered 2021-02-15: 2 mg via INTRAVENOUS

## 2021-02-15 MED ORDER — ACETAMINOPHEN 500 MG PO TABS
1000.0000 mg | ORAL_TABLET | Freq: Three times a day (TID) | ORAL | Status: AC
  Administered 2021-02-15 – 2021-02-16 (×3): 1000 mg via ORAL
  Filled 2021-02-15 (×3): qty 2

## 2021-02-15 MED ORDER — FENTANYL CITRATE PF 50 MCG/ML IJ SOSY
25.0000 ug | PREFILLED_SYRINGE | INTRAMUSCULAR | Status: DC | PRN
  Administered 2021-02-15: 50 ug via INTRAVENOUS

## 2021-02-15 MED ORDER — ONDANSETRON HCL 4 MG/2ML IJ SOLN
INTRAMUSCULAR | Status: AC
  Filled 2021-02-15: qty 2

## 2021-02-15 MED ORDER — FENTANYL CITRATE (PF) 100 MCG/2ML IJ SOLN
INTRAMUSCULAR | Status: AC
  Filled 2021-02-15: qty 2

## 2021-02-15 MED ORDER — FENTANYL CITRATE (PF) 100 MCG/2ML IJ SOLN
INTRAMUSCULAR | Status: DC | PRN
  Administered 2021-02-15 (×2): 25 ug via INTRAVENOUS

## 2021-02-15 MED ORDER — DIPHENHYDRAMINE HCL 12.5 MG/5ML PO ELIX
12.5000 mg | ORAL_SOLUTION | Freq: Four times a day (QID) | ORAL | Status: DC | PRN
  Administered 2021-02-16 (×2): 12.5 mg via ORAL
  Filled 2021-02-15 (×2): qty 5

## 2021-02-15 MED ORDER — ONDANSETRON HCL 4 MG/2ML IJ SOLN
INTRAMUSCULAR | Status: DC | PRN
  Administered 2021-02-15: 4 mg via INTRAVENOUS

## 2021-02-15 MED ORDER — ACETAMINOPHEN 160 MG/5ML PO SOLN: 325.0000 mg | ORAL | Status: DC | PRN

## 2021-02-15 MED ORDER — MEPERIDINE HCL 50 MG/ML IJ SOLN: 6.2500 mg | INTRAMUSCULAR | Status: DC | PRN

## 2021-02-15 MED ORDER — DEXAMETHASONE SODIUM PHOSPHATE 10 MG/ML IJ SOLN
INTRAMUSCULAR | Status: AC
  Filled 2021-02-15: qty 1

## 2021-02-15 MED ORDER — DIPHENHYDRAMINE HCL 50 MG/ML IJ SOLN
12.5000 mg | Freq: Four times a day (QID) | INTRAMUSCULAR | Status: DC | PRN
  Administered 2021-02-15 (×2): 12.5 mg via INTRAVENOUS
  Filled 2021-02-15 (×2): qty 1

## 2021-02-15 MED ORDER — DIPHENHYDRAMINE HCL 25 MG PO CAPS
25.0000 mg | ORAL_CAPSULE | Freq: Four times a day (QID) | ORAL | Status: DC | PRN
  Administered 2021-02-15: 25 mg via ORAL
  Filled 2021-02-15 (×2): qty 1

## 2021-02-15 MED ORDER — HYDROMORPHONE HCL 1 MG/ML IJ SOLN
0.5000 mg | INTRAMUSCULAR | Status: DC | PRN
  Administered 2021-02-15 – 2021-02-16 (×10): 1 mg via INTRAVENOUS
  Filled 2021-02-15 (×11): qty 1

## 2021-02-15 MED ORDER — CHLORHEXIDINE GLUCONATE CLOTH 2 % EX PADS
6.0000 | MEDICATED_PAD | Freq: Every day | CUTANEOUS | Status: DC
  Administered 2021-02-15: 6 via TOPICAL

## 2021-02-15 MED ORDER — SODIUM CHLORIDE 0.9 % IR SOLN: 3000.0000 mL | Status: DC

## 2021-02-15 MED ORDER — OXYCODONE HCL 5 MG PO TABS
5.0000 mg | ORAL_TABLET | Freq: Once | ORAL | Status: AC | PRN
  Administered 2021-02-15: 5 mg via ORAL

## 2021-02-15 MED ORDER — PROPOFOL 10 MG/ML IV BOLUS
INTRAVENOUS | Status: AC
  Filled 2021-02-15: qty 20

## 2021-02-15 MED ORDER — FENTANYL CITRATE PF 50 MCG/ML IJ SOSY: 50.0000 ug | PREFILLED_SYRINGE | INTRAMUSCULAR | Status: DC

## 2021-02-15 MED ORDER — TAMSULOSIN HCL 0.4 MG PO CAPS: 0.4000 mg | ORAL_CAPSULE | Freq: Every day | ORAL | Status: DC | PRN

## 2021-02-15 MED ORDER — 0.9 % SODIUM CHLORIDE (POUR BTL) OPTIME
TOPICAL | Status: DC | PRN
Start: 2021-02-15 — End: 2021-02-15
  Administered 2021-02-15: 1000 mL

## 2021-02-15 MED ORDER — OXYCODONE HCL 5 MG PO TABS
10.0000 mg | ORAL_TABLET | ORAL | Status: DC | PRN
  Administered 2021-02-15 – 2021-02-16 (×3): 10 mg via ORAL
  Filled 2021-02-15 (×4): qty 2

## 2021-02-15 MED ORDER — HYDROMORPHONE HCL 1 MG/ML IJ SOLN
0.2500 mg | INTRAMUSCULAR | Status: DC | PRN
  Administered 2021-02-15 (×2): 0.5 mg via INTRAVENOUS

## 2021-02-15 MED ORDER — OXYCODONE HCL 5 MG/5ML PO SOLN: 5.0000 mg | Freq: Once | ORAL | Status: AC | PRN

## 2021-02-15 MED ORDER — LIDOCAINE HCL (PF) 2 % IJ SOLN
INTRAMUSCULAR | Status: AC
  Filled 2021-02-15: qty 5

## 2021-02-15 MED ORDER — PERCOCET 5-325 MG PO TABS: 1.0000 | ORAL_TABLET | Freq: Three times a day (TID) | ORAL | 0 refills | Status: DC | PRN

## 2021-02-15 MED ORDER — LIDOCAINE 2% (20 MG/ML) 5 ML SYRINGE
INTRAMUSCULAR | Status: DC | PRN
  Administered 2021-02-15: 100 mg via INTRAVENOUS

## 2021-02-15 MED ORDER — SODIUM CHLORIDE 0.9 % IR SOLN
Status: DC | PRN
  Administered 2021-02-15: 3000 mL

## 2021-02-15 MED ORDER — STERILE WATER FOR IRRIGATION IR SOLN
Status: DC | PRN
Start: 2021-02-15 — End: 2021-02-15
  Administered 2021-02-15: 500 mL

## 2021-02-15 MED ORDER — FENTANYL CITRATE PF 50 MCG/ML IJ SOSY
PREFILLED_SYRINGE | INTRAMUSCULAR | Status: AC
  Filled 2021-02-15: qty 3

## 2021-02-15 MED ORDER — ONDANSETRON HCL 4 MG/2ML IJ SOLN: 4.0000 mg | Freq: Once | INTRAMUSCULAR | Status: DC | PRN

## 2021-02-15 MED ORDER — IOHEXOL 300 MG/ML  SOLN
INTRAMUSCULAR | Status: DC | PRN
  Administered 2021-02-15: 20 mL

## 2021-02-15 SURGICAL SUPPLY — 29 items
BAG DRN RND TRDRP ANRFLXCHMBR (UROLOGICAL SUPPLIES) ×1
BAG URINE DRAIN 2000ML AR STRL (UROLOGICAL SUPPLIES) ×1 IMPLANT
BAG URO CATCHER STRL LF (MISCELLANEOUS) ×2 IMPLANT
BASKET LASER NITINOL 1.9FR (BASKET) ×1 IMPLANT
BSKT STON RTRVL 120 1.9FR (BASKET) ×1
CATH FOLEY 3WAY 30CC 22FR (CATHETERS) ×1 IMPLANT
CATH URETL OPEN END 6FR 70 (CATHETERS) ×2 IMPLANT
CLOTH BEACON ORANGE TIMEOUT ST (SAFETY) ×1 IMPLANT
EXTRACTOR STONE 1.7FRX115CM (UROLOGICAL SUPPLIES) IMPLANT
GLOVE SURG ENC TEXT LTX SZ7.5 (GLOVE) ×2 IMPLANT
GOWN STRL REUS W/TWL LRG LVL3 (GOWN DISPOSABLE) ×2 IMPLANT
GUIDEWIRE ANG ZIPWIRE 038X150 (WIRE) ×3 IMPLANT
GUIDEWIRE STR DUAL SENSOR (WIRE) ×3 IMPLANT
KIT TURNOVER KIT A (KITS) IMPLANT
LASER FIB FLEXIVA PULSE ID 365 (Laser) IMPLANT
LASER FIB FLEXIVA PULSE ID 550 (Laser) IMPLANT
LASER FIB FLEXIVA PULSE ID 910 (Laser) IMPLANT
MANIFOLD NEPTUNE II (INSTRUMENTS) ×2 IMPLANT
PACK CYSTO (CUSTOM PROCEDURE TRAY) ×2 IMPLANT
SHEATH NAVIGATOR HD 11/13X28 (SHEATH) IMPLANT
SHEATH NAVIGATOR HD 11/13X36 (SHEATH) ×1 IMPLANT
STENT POLARIS 5FRX24 (STENTS) ×2 IMPLANT
SYR 30ML LL (SYRINGE) ×1 IMPLANT
SYR 50ML LL SCALE MARK (SYRINGE) ×1 IMPLANT
TRACTIP FLEXIVA PULS ID 200XHI (Laser) IMPLANT
TRACTIP FLEXIVA PULSE ID 200 (Laser) ×2
TUBE FEEDING 8FR 16IN STR KANG (MISCELLANEOUS) ×2 IMPLANT
TUBING CONNECTING 10 (TUBING) ×2 IMPLANT
TUBING UROLOGY SET (TUBING) ×2 IMPLANT

## 2021-02-15 NOTE — Anesthesia Postprocedure Evaluation (Signed)
Anesthesia Post Note  Patient: MAZE CORNIEL  Procedure(s) Performed: CYSTOSCOPY WITH RETROGRADE PYELOGRAM, URETEROSCOPY AND STENT PLACEMENT (Bilateral) HOLMIUM LASER APPLICATION (Bilateral)     Patient location during evaluation: PACU Anesthesia Type: General Level of consciousness: awake and alert Pain management: pain level controlled Vital Signs Assessment: post-procedure vital signs reviewed and stable Respiratory status: spontaneous breathing, nonlabored ventilation, respiratory function stable and patient connected to nasal cannula oxygen Cardiovascular status: blood pressure returned to baseline and stable Postop Assessment: no apparent nausea or vomiting Anesthetic complications: no   No notable events documented.  Last Vitals:  Vitals:   02/15/21 0845 02/15/21 0900  BP: (!) 136/100 (!) 135/103  Pulse: 96 84  Resp: 16 17  Temp:    SpO2: 100% 97%    Last Pain:  Vitals:   02/15/21 0929  TempSrc:   PainSc: 8                  Minh Jasper

## 2021-02-15 NOTE — Op Note (Signed)
NAME: Devin Sanders, Devin Sanders MEDICAL RECORD NO: [id number] ACCOUNT NO: [id number] DATE OF BIRTH: [birth year] FACILITY: [facility] LOCATION: [id number] PHYSICIAN: Alexis Frock, MD  Operative Report   DATE OF PROCEDURE: 02/15/2021  PREOPERATIVE DIAGNOSIS:  Cystinuria, bilateral renal stones, recurrent.  POSTOPERATIVE DIAGNOSIS:  Cystinuria, bilateral renal stones, recurrent plus left infundibular renal stenosis.  PROCEDURE PERFORMED: 1.  Cystoscopy with bilateral retrograde pyelograms interpretation. 2.  Bilateral ureteroscopy with laser lithotripsy. 3.  Insertion of bilateral ureteral stents. 4.  Left laser endopyelotomy.  ESTIMATED BLOOD LOSS:  50 mL.  COMPLICATIONS:  None.  SPECIMEN:  Bilateral renal stone fragments irrigated for discard.  FINDINGS:   1.  Bilateral mostly papillary tip calcifications, no hydronephrosis. 2.  Left upper mid infundibular stenosis. 3.  Resolution of infundibular stenosis following laser endopyelotomy.  DRAINS:  3-way Foley catheter to normal saline irrigation.  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  He is status post innumerable endoscopic procedures for recurrent stones.  He is very compliant with medical therapy.  He was found recently on  workup of colicky flank pain to have bilateral renal stones without hydronephrosis.  Some of these stones were larger than 6-7 mm and felt likely to be too large past medical therapy and to prevent progression to obstruction or need for percutaneous surgery, we both agreed on elective bilateral ureteroscopy.  He presents for this today.  Informed consent was obtained and placed in medical record.  Timeout performed.  Intravenous antibiotics administered.  General anesthesia induced.  The patient was placed into a low lithotomy position.  Prepped and draped the base of the penis, perineum and proximal thighs using iodine.  Cystourethroscopy was performed using  a 21-French rigid  cystoscope with offset lens.  Inspection of the anterior and posterior urethra was unremarkable. Inspection of the bladder revealed no diverticula, calcifications or papillary lesions.  The left ureteral orifice was cannulated with a 6-French end-hole  catheter and a left retrograde pyelogram was obtained.  Left retrograde pyelogram demonstrated single left ureter, single system left kidney.  Marland Kitchen038 zipswire advanced as a safety wire.  Next, right retrograde pyelogram was obtained.  Right retrograde pyelogram demonstrate single right ureter, single system right kidney.  No filling defects or narrowing noted.  Marland Kitchen038 zipswire advanced as a safety wire.  An 8-French feeding tube placed in the urinary bladder for pressure release.  Semi-rigid ureteroscopy was  performed of the distal 2/3 of the ureter. No mucosal abnormalities were found.  Next, semirigid ureteroscopy was performed  of the contralateral ureter.  The semirigid scope was then exchanged for an 11/13 medium length ureteral access sheath to the level of proximal ureter.  Using continuous fluoroscopic  guidance, a flexible digital ureteroscopy was performed of the proximal left ureter with systematic inspection of left kidney.  There is multifocal relatively small volume intrarenal stones, several papillary tip calcifications that were dominant in the lower pole.  These were amenable to laser lithotripsy, which was performed using holmium laser settings of 0.2 joules and 20 Hz and using dusting technique, the stones were completely ablated.  There was an area in the upper mid pole  nfundibula an  additional calix.  This was felt to have a high likelihood to represent infundibular stenosis with likely stone behind this possibly exacerbating his symptomatology with intrarenal obstruction, but did appear amenable to laser endopyelotomy and similarly  using the laser fiber with settings of 1 joule and 10 Hz.  Incision was  made in this  infundibulum, thus opening it to estimated diameter approximately 6 mm.  There was some stone within this, which was similarly ablated and irrigated as this bleeding from this, but not severe.  Next, the access sheath was placed over the right sensor working wire to the level of the proximal right ureter and using continuous fluoroscopic guidance and flexible digital ureteroscopy  was performed in the proximal ureter and systematic inspection of the right kidney including all calices x3.  Similarly, there was multifocal papillary tip calcifications, most of which were amenable to laser ablation with dominant fragments, which were grasped  with the escape basket and removed and set aside and discarded.   Access sheath was removed under continuous vision.  No significant mucosal abnormalities were found.  Given the endopyelotomy performed, it was felt that stents would be most prudent bialterally .  Bladder was once again inspected.  There was noted to be some mild nonpulsatile ooze from the area of the left ureteral orifice likely consistent with bleeding from the endopyelotomy and a3-way catheter on very gentle irrigation was plaed with plan for overnight observation, likely discharge after verifying hemodynamic stability and resolution of bleeding.   CHR D: 02/15/2021 8:25:09 am T: 02/15/2021 11:28:00 am  JOB: [id number]

## 2021-02-15 NOTE — Transfer of Care (Signed)
Immediate Anesthesia Transfer of Care Note  Patient: Devin Sanders  Procedure(s) Performed: CYSTOSCOPY WITH RETROGRADE PYELOGRAM, URETEROSCOPY AND STENT PLACEMENT (Bilateral) HOLMIUM LASER APPLICATION (Bilateral)  Patient Location: PACU  Anesthesia Type:General  Level of Consciousness: drowsy  Airway & Oxygen Therapy: Patient Spontanous Breathing and Patient connected to face mask oxygen  Post-op Assessment: Report given to RN and Post -op Vital signs reviewed and stable  Post vital signs: Reviewed and stable  Last Vitals:  Vitals Value Taken Time  BP 132/90 02/15/21 0825  Temp 36.7 C 02/15/21 0825  Pulse 83 02/15/21 0826  Resp 8 02/15/21 0826  SpO2 94 % 02/15/21 0826  Vitals shown include unvalidated device data.  Last Pain:  Vitals:   02/15/21 0545  TempSrc: Oral         Complications: No notable events documented.

## 2021-02-15 NOTE — Anesthesia Procedure Notes (Signed)
Procedure Name: LMA Insertion Date/Time: 02/15/2021 7:21 AM Performed by: Florene Route, CRNA Patient Re-evaluated:Patient Re-evaluated prior to induction Oxygen Delivery Method: Circle system utilized Preoxygenation: Pre-oxygenation with 100% oxygen Induction Type: IV induction LMA: LMA inserted LMA Size: 4.0 Number of attempts: 1 Placement Confirmation: positive ETCO2 and breath sounds checked- equal and bilateral Tube secured with: Tape Dental Injury: Teeth and Oropharynx as per pre-operative assessment

## 2021-02-15 NOTE — H&P (Signed)
Devin Sanders is an 41 y.o. male.    Chief Complaint: Pre-Op BILATERAL Ureteroscopic Stone Manipulation  HPI:   1 - Recurrent Nephrolithiasis -  Pre 2019 - PCNL several each side, URS x innumerable each side at [facility].  02/2017 - left URS for 1.5cm stone to stone reduction (not stone free) at [facility] per OR notes  09/2017 - Rt ureteroscopy / Lt medical passage Devin Sanders / Devin Sanders); 10/2017 - bilateral URS to stone free  01/2018 - bilateral URS to stone free  01/2019 -bilateral stage urs to stone free; 05/2019 - bilateral URS to stone free; 09/2019 [place] - 1cm RUP, 1cm RLP, 1.4cm LUP no hydro  08/2020 - Rt URS for RLP 1cm, Rt mid 3mm, punctate Lt renal not adressed.  02/2021 - ER CT with scattered L>R Pap tip calcs, abtou 1.2cm volume left, no hydro.   2 - Cysteinuria - ON thiola 500mg  BID / day PLUS K-Cit 2000MEQ BID meals for cysteinuria.   PMH sig for mild obesity. 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 His PCP is Morrie Sheldon MD.   Today " Devin Sanders " is seen to proceed with BILATERAL ureteroscopy for recurrent stones. NO interval fevers. Most recent imaging w/o hydro and UA without infectious paremeters.   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)   History of kidney stones    Left ureteral calculus    Renal calculus, left    Wears glasses     Past Surgical History:  Procedure Laterality Date   CYSTOSCOPY WITH RETROGRADE PYELOGRAM, URETEROSCOPY AND STENT PLACEMENT Bilateral 06/26/2017   Procedure: CYSTOSCOPY WITH RETROGRADE PYELOGRAM, URETEROSCOPY, STONE BASKETRY  AND STENT PLACEMENT;  Surgeon: Alexis Frock, MD;  Location: [facility];  Service: Urology;  Laterality: Bilateral;   CYSTOSCOPY WITH RETROGRADE PYELOGRAM, URETEROSCOPY AND STENT PLACEMENT Right 09/18/2017   Procedure: CYSTOSCOPY WITH RETROGRADE PYELOGRAM, URETEROSCOPY AND  STENT PLACEMENT;  Surgeon: Ardis Hughs, MD;  Location: [facility];  Service: Urology;  Laterality: Right;   CYSTOSCOPY WITH RETROGRADE PYELOGRAM, URETEROSCOPY AND STENT PLACEMENT Bilateral 10/13/2017   Procedure: CYSTOSCOPY WITH RETROGRADE PYELOGRAM, BILATERAL URETEROSCOPY AND BILATERAL STENT PLACEMENT, LASER;  Surgeon: Alexis Frock, MD;  Location: [facility];  Service: Urology;  Laterality: Bilateral;  48 MINS   CYSTOSCOPY WITH RETROGRADE PYELOGRAM, URETEROSCOPY AND STENT PLACEMENT Bilateral 03/24/2018   Procedure: CYSTOSCOPY WITH RETROGRADE PYELOGRAM, URETEROSCOPY AND STENT PLACEMENT;  Surgeon: Alexis Frock, MD;  Location: [facility];  Service: Urology;  Laterality: Bilateral;  1 HR   CYSTOSCOPY WITH RETROGRADE PYELOGRAM, URETEROSCOPY AND STENT PLACEMENT Bilateral 07/07/2018   Procedure: CYSTOSCOPY WITH RETROGRADE PYELOGRAM, URETEROSCOPY AND STENT PLACEMENT;  Surgeon: Alexis Frock, MD;  Location: [facility];  Service: Urology;  Laterality: Bilateral;  67 MINUTES   CYSTOSCOPY WITH RETROGRADE PYELOGRAM, URETEROSCOPY AND STENT PLACEMENT Left 01/12/2019   Procedure: CYSTOSCOPY WITH RETROGRADE PYELOGRAM, URETEROSCOPY AND STENT PLACEMENT;  Surgeon: Alexis Frock, MD;  Location: [facility];  Service: Urology;  Laterality: Left;  90 MINS   CYSTOSCOPY WITH RETROGRADE PYELOGRAM, URETEROSCOPY AND STENT PLACEMENT Left 01/28/2019   Procedure: CYSTOSCOPY WITH RETROGRADE PYELOGRAM, URETEROSCOPY AND STENT EXCHANGE STONE BASKET EXTRACTION ;  Surgeon: Alexis Frock, MD;  Location: [facility];  Service: Urology;  Laterality: Left;   CYSTOSCOPY WITH RETROGRADE PYELOGRAM, URETEROSCOPY AND STENT PLACEMENT Bilateral 05/13/2019   Procedure: CYSTOSCOPY WITH BILATERAL  RETROGRADE PYELOGRAM, LEFT  URETEROSCOPY WITH HOLMIUM LASER AND STENT PLACEMENT;  Surgeon: Alexis Frock, MD;  Location: [facility];  Service: Urology;  Laterality: Bilateral;   CYSTOSCOPY WITH RETROGRADE PYELOGRAM, URETEROSCOPY AND STENT PLACEMENT  Bilateral 11/02/2019   Procedure: CYSTOSCOPY WITH RETROGRADE PYELOGRAM, URETEROSCOPY AND STENT PLACEMENT;  Surgeon: Alexis Frock, MD;  Location: [facility];  Service: Urology;  Laterality: Bilateral;  17 MINS   CYSTOSCOPY WITH RETROGRADE PYELOGRAM, URETEROSCOPY AND STENT PLACEMENT Bilateral 10/12/2020   Procedure: CYSTOSCOPY WITH RETROGRADE PYELOGRAM, URETEROSCOPY, BASKETING OF STONES AND BILATERAL STENT PLACEMENTS;  Surgeon: Alexis Frock, MD;  Location: [facility];  Service: Urology;  Laterality: Bilateral;  75 MINS   CYSTOSCOPY/RETROGRADE/URETEROSCOPY/STONE EXTRACTION WITH BASKET Left 10/05/2017   Procedure: CYSTOSCOPY/RETROGRADE/STONE REMOVAL FROM BLADDER ;  Surgeon: Irine Seal, MD;  Location: [facility];  Service: Urology;  Laterality: Left;   CYSTOSCOPY/URETEROSCOPY/HOLMIUM LASER/STENT PLACEMENT Right 09/28/2017   Procedure: CYSTOSCOPY/RIGHT RETROGRADE URETEROSCOPY/HOLMIUM LASER/ RIGHT STENT PLACEMENT;  Surgeon: Ceasar Mons, MD;  Location: [facility];  Service: Urology;  Laterality: Right;   CYSTOSCOPY/URETEROSCOPY/HOLMIUM LASER/STENT PLACEMENT Bilateral 01/27/2018   Procedure: CYSTOSCOPY/URETEROSCOPY/HOLMIUM LASER/STENT PLACEMENT;  Surgeon: Alexis Frock, MD;  Location: [facility];  Service: Urology;  Laterality: Bilateral;   CYSTOSCOPY/URETEROSCOPY/HOLMIUM LASER/STENT PLACEMENT Right 08/08/2020   Procedure: CYSTOSCOPY/URETEROSCOPY/RETROGRADE/ HOLMIUM LASER/STENT PLACEMENT;  Surgeon: Franchot Gallo, MD;  Location: [facility];  Service: Urology;  Laterality: Right;   HOLMIUM LASER APPLICATION Bilateral [id number]   Procedure: HOLMIUM LASER APPLICATION;  Surgeon: Alexis Frock, MD;  Location: [facility];  Service: Urology;  Laterality: Bilateral;   HOLMIUM LASER APPLICATION Right [id number]   Procedure: HOLMIUM LASER APPLICATION;  Surgeon: Ardis Hughs, MD;  Location: [facility];  Service: Urology;  Laterality: Right;   HOLMIUM LASER  APPLICATION Bilateral [id number]   Procedure: HOLMIUM LASER APPLICATION;  Surgeon: Alexis Frock, MD;  Location: [facility];  Service: Urology;  Laterality: Bilateral;   HOLMIUM LASER APPLICATION Bilateral [id number]   Procedure: HOLMIUM LASER APPLICATION;  Surgeon: Alexis Frock, MD;  Location: [facility];  Service: Urology;  Laterality: Bilateral;   HOLMIUM LASER APPLICATION Bilateral [id number]   Procedure: HOLMIUM LASER APPLICATION;  Surgeon: Alexis Frock, MD;  Location: [facility];  Service: Urology;  Laterality: Bilateral;   HOLMIUM LASER APPLICATION Bilateral [id number]   Procedure: HOLMIUM LASER APPLICATION;  Surgeon: Alexis Frock, MD;  Location: [facility];  Service: Urology;  Laterality: Bilateral;   HOLMIUM LASER APPLICATION Left [id number]   Procedure: HOLMIUM LASER APPLICATION;  Surgeon: Alexis Frock, MD;  Location: [facility];  Service: Urology;  Laterality: Left;   HOLMIUM LASER APPLICATION Bilateral [id number]   Procedure: HOLMIUM LASER APPLICATION;  Surgeon: Alexis Frock, MD;  Location: [facility];  Service: Urology;  Laterality: Bilateral;   PERCUTANEOUS NEPHROSTOLITHOTOMY  2003;  2009;  06-22-2014; 01-30-2015  @ [facility]   URETEROSCOPIC STONE MANIPULATION UNILATERAL  multiple since 1997--  last one 02-19-2017  @ [facility]    History reviewed. No pertinent family history. Social History:  reports that he has never smoked. He has never used smokeless tobacco. He reports that he does not drink alcohol and does not use drugs.  Allergies:  Allergies  Allergen Reactions   Morphine Nausea And Vomiting   Other Hives and Swelling    Mangos    Medications Prior to Admission  Medication Sig Dispense Refill   PERCOCET 5-325 MG tablet Take 1-2 tablets by mouth every 6 (six) hours as needed for pain ([place] stones).     tamsulosin (FLOMAX) 0.4 MG CAPS capsule  Take 0.4 mg by mouth daily as needed (Kidney stones).     ibuprofen (ADVIL) 400 MG tablet Take 1 tablet  (400 mg total) by mouth 3 (three) times daily. Take with food. (Patient not taking: Reported on 02/04/2021) 15 tablet 0   ketorolac (TORADOL) 10 MG tablet Take 1 tablet (10 mg total) by mouth every 8 (eight) hours as needed for moderate pain. (Patient not taking: Reported on 02/04/2021) 20 tablet 0    Results for orders placed or performed during the hospital encounter of 02/14/21 (from the past 48 hour(s))  CBC     Status: None   Collection Time: 02/14/21  4:10 PM  Result Value Ref Range   WBC 6.4 4.0 - 10.5 K/uL   RBC 4.43 4.22 - 5.81 MIL/uL   Hemoglobin 14.4 13.0 - 17.0 g/dL   HCT 42.2 39.0 - 52.0 %   MCV 95.3 80.0 - 100.0 fL   MCH 32.5 26.0 - 34.0 pg   MCHC 34.1 30.0 - 36.0 g/dL   RDW 11.8 11.5 - 15.5 %   Platelets 294 150 - 400 K/uL   nRBC 0.0 0.0 - 0.2 %    Comment: Performed at [facility] [place] [address], [place], [place] [id number]  Basic metabolic panel     Status: Abnormal   Collection Time: 02/14/21  4:10 PM  Result Value Ref Range   Sodium 137 135 - 145 mmol/L   Potassium 3.8 3.5 - 5.1 mmol/L   Chloride 105 98 - 111 mmol/L   CO2 25 22 - 32 mmol/L   Glucose, Bld 96 70 - 99 mg/dL    Comment: Glucose reference range applies only to samples taken after fasting for at least 8 hours.   BUN 21 (H) 6 - 20 mg/dL   Creatinine, Ser 1.09 0.61 - 1.24 mg/dL   Calcium 9.2 8.9 - 10.3 mg/dL   GFR, Estimated >60 >60 mL/min    Comment: (NOTE) Calculated using the CKD-EPI Creatinine Equation (2021)    Anion gap 7 5 - 15    Comment: Performed at [facility], [place] [address], [place], [place] [address]   CT Renal Stone Study  Result Date: 02/14/2021 CLINICAL DATA:  Left-sided flank pain with nausea and vomiting EXAM: CT ABDOMEN AND PELVIS WITHOUT CONTRAST TECHNIQUE: Multidetector CT imaging of the abdomen and pelvis was performed following the standard protocol without IV contrast. RADIATION DOSE REDUCTION: This exam was performed according to the  departmental dose-optimization program which includes automated exposure control, adjustment of the mA and/or kV according to patient size and/or use of iterative reconstruction technique. COMPARISON:  Multiple priors including most recent CT January 25, 2021 FINDINGS: Lower chest: No acute abnormality. Hepatobiliary: Unremarkable noncontrast appearance of the hepatic parenchyma. Gallbladder is unremarkable. No biliary ductal dilation. Pancreas: No pancreatic ductal dilation or evidence of acute inflammation. Spleen: Within normal limits. Adrenals/Urinary Tract: Bilateral adrenal glands appear normal. No hydronephrosis. Single punctate right upper pole nephrolithiasis. Numerous left-sided nephrolithiasis measuring up to 5 mm. No obstructive ureteral or bladder calculi identified. Urinary bladder is decompressed limiting evaluation. Stomach/Bowel: No radiopaque enteric contrast was administered. Stomach is unremarkable for degree of distension. No pathologic dilation of small or large bowel. The appendix and terminal ileum appear normal. No evidence of acute bowel inflammation. Vascular/Lymphatic: Normal caliber aorta. No pathologically enlarged abdominal or pelvic lymph nodes. Reproductive: Prostate is unremarkable. Other: No abdominopelvic free fluid. Musculoskeletal: Minimal spondylosis.  No acute osseous abnormality. IMPRESSION: 1. Bilateral nephrolithiasis, similar  prior. No obstructive ureteral or bladder calculi identified. 2. No acute findings within the abdomen or pelvis. Electronically Signed   By: Dahlia Bailiff M.D.   On: 02/14/2021 16:22    Review of Systems  Constitutional:  Negative for chills and fever.  Genitourinary:  Positive for flank pain.  All other systems reviewed and are negative.  Blood pressure (!) 132/102, pulse 66, temperature 98.5 F (36.9 C), temperature source Oral, resp. rate 16, SpO2 96 %. Physical Exam Vitals reviewed.  HENT:     Nose: Nose normal.  Eyes:     Pupils:  Pupils are equal, round, and reactive to light.  Cardiovascular:     Rate and Rhythm: Normal rate.  Pulmonary:     Effort: Pulmonary effort is normal.  Abdominal:     General: Abdomen is flat.  Genitourinary:    Comments: Mild left CVAT at present Musculoskeletal:        General: Normal range of motion.     Cervical back: Normal range of motion.  Skin:    General: Skin is warm.  Neurological:     General: No focal deficit present.     Mental Status: He is alert.  Psychiatric:        Mood and Affect: Mood normal.     Assessment/Plan  Proceed as planned with BILATERAL ureteroscopic stone manipulation with goal of stone free. Risks, benefits, alternatives, expected peri-op course discussed previously and reiterated today.   Alexis Frock, MD 02/15/2021, 7:00 AM

## 2021-02-15 NOTE — Brief Op Note (Signed)
02/15/2021  8:17 AM  PATIENT:  Devin Sanders  41 y.o. male  PRE-OPERATIVE DIAGNOSIS:  BILATERAL RECURRENT KIDNEY STONES  POST-OPERATIVE DIAGNOSIS:  BILATERAL RECURRENT KIDNEY STONES, LEFT Infundibular Stenosis  PROCEDURE:  Procedure(s) with comments: CYSTOSCOPY WITH RETROGRADE PYELOGRAM, URETEROSCOPY AND STENT PLACEMENT (Bilateral) - 75 MINS HOLMIUM LASER APPLICATION (Bilateral), LEFT Laser Endopyelotomy (incision of infundibular stenosis)  SURGEON:  Surgeon(s) and Role:    [name], MD - Primary  PHYSICIAN ASSISTANT:   ASSISTANTS: none   ANESTHESIA:   general  EBL:  39mL   BLOOD ADMINISTERED:none  DRAINS:  3 way foley to NS irrigation,     LOCAL MEDICATIONS USED:  NONE  SPECIMEN:  Source of Specimen:  bilateral renal stone fragments  DISPOSITION OF SPECIMEN:   discard  COUNTS:  YES  TOURNIQUET:  * No tourniquets in log *  DICTATION: .Other Dictation: Dictation Number [id number]  PLAN OF CARE: Admit for overnight observation  PATIENT DISPOSITION:  PACU - hemodynamically stable.   Delay start of Pharmacological VTE agent (>24hrs) due to surgical blood loss or risk of bleeding: yes

## 2021-02-16 ENCOUNTER — Encounter (HOSPITAL_COMMUNITY): Payer: Self-pay | Admitting: Urology

## 2021-02-16 DIAGNOSIS — N200 Calculus of kidney: Secondary | ICD-10-CM | POA: Diagnosis not present

## 2021-02-16 LAB — HEMOGLOBIN AND HEMATOCRIT, BLOOD
HCT: 40.3 % (ref 39.0–52.0)
Hemoglobin: 13.4 g/dL (ref 13.0–17.0)

## 2021-02-16 NOTE — Discharge Instructions (Signed)

## 2021-02-16 NOTE — Progress Notes (Signed)
Patient has been taught and explained discharge instructions. Patient has no further questions at this time. IV has been removed and site is clean, dry and intact. ? ?Rylyn Ranganathan, RN ?

## 2021-02-16 NOTE — Discharge Summary (Signed)
Patient ID: Devin Sanders MRN: [id number] DOB/AGE: Aug 01, 1980 41 y.o.  Admit date: 02/15/2021 Discharge date: 02/16/2021  Primary Care Physician:  Medicine, [facility]  Discharge Diagnoses: Urolithiasis, cystinuria    Discharge Medications: Allergies as of 02/16/2021       Reactions   Morphine Nausea And Vomiting   Other Hives, Swelling   Mangos        Medication List     TAKE these medications    ibuprofen 400 MG tablet Commonly known as: ADVIL Take 1 tablet (400 mg total) by mouth 3 (three) times daily. Take with food.   ketorolac 10 MG tablet Commonly known as: TORADOL Take 1 tablet (10 mg total) by mouth every 8 (eight) hours as needed for moderate pain.   Percocet 5-325 MG tablet Generic drug: oxyCODONE-acetaminophen Take 1-2 tablets by mouth every 8 (eight) hours as needed for severe pain. Post-operatively What changed:  when to take this reasons to take this additional instructions   tamsulosin 0.4 MG Caps capsule Commonly known as: FLOMAX Take 0.4 mg by mouth daily as needed (Kidney stones).         Significant Diagnostic Studies:  DG C-Arm 1-60 Min-No Report  Result Date: 02/15/2021 Fluoroscopy was utilized by the requesting physician.  No radiographic interpretation.    Brief H and P: For complete details please refer to admission H and P, but in brief patient has a longstanding history of recurrent cystine stones.  Admitted for management.  Hospital Course: Patient underwent bilateral ureteroscopic stone management with endopyelotomy.  He had an uncomplicated postoperative course.  His catheter was removed on postoperative day #1.  CBI was performed initially but urine cleared quite quickly.  He was discharged home on postoperative day #1.  Catheter had been removed. Principal Problem:   Renal stone   Day of Discharge BP 138/87 (BP Location: Left Arm)    Pulse 95    Temp 97.8 F (36.6 C) (Oral)    Resp (!) 22    Ht 5\' 10"   (1.778 m)    Wt 104.3 kg    SpO2 97%    BMI 32.99 kg/m   Results for orders placed or performed during the hospital encounter of 02/15/21 (from the past 24 hour(s))  Hemoglobin and hematocrit, blood     Status: None   Collection Time: 02/16/21  4:45 AM  Result Value Ref Range   Hemoglobin 13.4 13.0 - 17.0 g/dL   HCT 40.3 39.0 - 52.0 %    Physical Exam: General: Alert and awake oriented x3 not in any acute distress. HEENT: anicteric sclera, pupils reactive to light and accommodation CVS: S1-S2 clear no murmur rubs or gallops Chest: clear to auscultation bilaterally, no wheezing rales or rhonchi Abdomen: soft nontender, nondistended, normal bowel sounds, no organomegaly Extremities: no cyanosis, clubbing or edema noted bilaterally Neuro: Cranial nerves II-XII intact, no focal neurological deficits  Disposition: Home  Diet: Regular  Activity: Gradually increase, discussed with patient  [place]     Alexis Frock, MD Follow up on 02/19/2021.   Specialty: Urology Why: at 7:45 for MD visit and ultrasound Contact information: [place] [address] [phone]                 Time spent on Discharge:  10 minutes  Signed: Lillette Boxer Braxton Weisbecker 02/16/2021, 8:39 AM

## 2021-02-18 ENCOUNTER — Inpatient Hospital Stay (HOSPITAL_COMMUNITY)
Admission: EM | Admit: 2021-02-18 | Discharge: 2021-02-20 | DRG: 948 | Disposition: A | Discharge status: Home / Self Care | Payer: PRIVATE HEALTH INSURANCE | Attending: Urology | Admitting: Urology

## 2021-02-18 ENCOUNTER — Encounter (HOSPITAL_COMMUNITY): Payer: Self-pay

## 2021-02-18 ENCOUNTER — Emergency Department (HOSPITAL_COMMUNITY): Payer: PRIVATE HEALTH INSURANCE

## 2021-02-18 ENCOUNTER — Other Ambulatory Visit: Payer: Self-pay

## 2021-02-18 DIAGNOSIS — R109 Unspecified abdominal pain: Secondary | ICD-10-CM | POA: Diagnosis not present

## 2021-02-18 DIAGNOSIS — G8918 Other acute postprocedural pain: Principal | ICD-10-CM | POA: Diagnosis present

## 2021-02-18 DIAGNOSIS — Z885 Allergy status to narcotic agent status: Secondary | ICD-10-CM

## 2021-02-18 DIAGNOSIS — Z20822 Contact with and (suspected) exposure to covid-19: Secondary | ICD-10-CM | POA: Diagnosis present

## 2021-02-18 DIAGNOSIS — Z87442 Personal history of urinary calculi: Secondary | ICD-10-CM

## 2021-02-18 DIAGNOSIS — Z9114 Patient's other noncompliance with medication regimen: Secondary | ICD-10-CM

## 2021-02-18 DIAGNOSIS — Z79899 Other long term (current) drug therapy: Secondary | ICD-10-CM

## 2021-02-18 DIAGNOSIS — R52 Pain, unspecified: Secondary | ICD-10-CM | POA: Diagnosis present

## 2021-02-18 DIAGNOSIS — G8929 Other chronic pain: Secondary | ICD-10-CM | POA: Diagnosis present

## 2021-02-18 DIAGNOSIS — Z8616 Personal history of COVID-19: Secondary | ICD-10-CM

## 2021-02-18 LAB — CBC
HCT: 39.4 % (ref 39.0–52.0)
Hemoglobin: 14.3 g/dL (ref 13.0–17.0)
MCH: 34.3 pg — ABNORMAL HIGH (ref 26.0–34.0)
MCHC: 36.3 g/dL — ABNORMAL HIGH (ref 30.0–36.0)
MCV: 94.5 fL (ref 80.0–100.0)
Platelets: 243 10*3/uL (ref 150–400)
RBC: 4.17 MIL/uL — ABNORMAL LOW (ref 4.22–5.81)
RDW: 11.8 % (ref 11.5–15.5)
WBC: 7.8 10*3/uL (ref 4.0–10.5)
nRBC: 0 % (ref 0.0–0.2)

## 2021-02-18 LAB — URINALYSIS, ROUTINE W REFLEX MICROSCOPIC
Bilirubin Urine: NEGATIVE
Glucose, UA: NEGATIVE mg/dL
Ketones, ur: NEGATIVE mg/dL
Nitrite: NEGATIVE
Protein, ur: 100 mg/dL — AB
RBC / HPF: >50 RBC/hpf — ABNORMAL HIGH (ref 0–5)
Specific Gravity, Urine: 1.014 (ref 1.005–1.030)
pH: 7 (ref 5.0–8.0)

## 2021-02-18 LAB — COMPREHENSIVE METABOLIC PANEL
ALT: 27 U/L (ref 0–44)
AST: 22 U/L (ref 15–41)
Albumin: 3.7 g/dL (ref 3.5–5.0)
Alkaline Phosphatase: 65 U/L (ref 38–126)
Anion gap: 6 (ref 5–15)
BUN: 19 mg/dL (ref 6–20)
CO2: 26 mmol/L (ref 22–32)
Calcium: 8.6 mg/dL — ABNORMAL LOW (ref 8.9–10.3)
Chloride: 104 mmol/L (ref 98–111)
Creatinine, Ser: 1 mg/dL (ref 0.61–1.24)
GFR, Estimated: >60 mL/min (ref >60)
Glucose, Bld: 92 mg/dL (ref 70–99)
Potassium: 3.6 mmol/L (ref 3.5–5.1)
Sodium: 136 mmol/L (ref 135–145)
Total Bilirubin: 0.7 mg/dL (ref 0.3–1.2)
Total Protein: 6.4 g/dL — ABNORMAL LOW (ref 6.5–8.1)

## 2021-02-18 LAB — RESP PANEL BY RT-PCR (FLU A&B, COVID) ARPGX2
Influenza A by PCR: NEGATIVE
Influenza B by PCR: NEGATIVE
SARS Coronavirus 2 by RT PCR: NEGATIVE

## 2021-02-18 MED ORDER — ONDANSETRON HCL 4 MG/2ML IJ SOLN
4.0000 mg | INTRAMUSCULAR | Status: DC | PRN
  Administered 2021-02-18 – 2021-02-19 (×3): 4 mg via INTRAVENOUS
  Filled 2021-02-18 (×3): qty 2

## 2021-02-18 MED ORDER — OXYCODONE HCL 5 MG PO TABS
5.0000 mg | ORAL_TABLET | ORAL | Status: DC | PRN
  Administered 2021-02-18 – 2021-02-20 (×2): 5 mg via ORAL
  Filled 2021-02-18 (×4): qty 1

## 2021-02-18 MED ORDER — SODIUM CHLORIDE 0.9 % IV BOLUS
1000.0000 mL | Freq: Once | INTRAVENOUS | Status: AC
  Administered 2021-02-18: 1000 mL via INTRAVENOUS

## 2021-02-18 MED ORDER — SODIUM CHLORIDE 0.9 % IV SOLN
6.2500 mg | Freq: Four times a day (QID) | INTRAVENOUS | Status: DC | PRN
  Administered 2021-02-18: 6.25 mg via INTRAVENOUS
  Filled 2021-02-18 (×4): qty 0.25

## 2021-02-18 MED ORDER — HYDROMORPHONE HCL 1 MG/ML IJ SOLN
0.5000 mg | Freq: Once | INTRAMUSCULAR | Status: AC
  Administered 2021-02-18: 0.5 mg via INTRAVENOUS
  Filled 2021-02-18: qty 1

## 2021-02-18 MED ORDER — TAMSULOSIN HCL 0.4 MG PO CAPS
0.4000 mg | ORAL_CAPSULE | Freq: Every day | ORAL | Status: DC
  Administered 2021-02-18 – 2021-02-19 (×2): 0.4 mg via ORAL
  Filled 2021-02-18 (×2): qty 1

## 2021-02-18 MED ORDER — SODIUM CHLORIDE 0.9 % IV SOLN: Freq: Once | INTRAVENOUS | Status: AC

## 2021-02-18 MED ORDER — DIPHENHYDRAMINE HCL 50 MG/ML IJ SOLN
25.0000 mg | Freq: Once | INTRAMUSCULAR | Status: AC
Start: 2021-02-18 — End: 2021-02-18
  Administered 2021-02-18: 25 mg via INTRAVENOUS
  Filled 2021-02-18: qty 1

## 2021-02-18 MED ORDER — HYDROMORPHONE HCL 1 MG/ML IJ SOLN
1.0000 mg | Freq: Once | INTRAMUSCULAR | Status: AC
  Administered 2021-02-18: 1 mg via INTRAVENOUS
  Filled 2021-02-18: qty 1

## 2021-02-18 MED ORDER — KETOROLAC TROMETHAMINE 15 MG/ML IJ SOLN
15.0000 mg | Freq: Once | INTRAMUSCULAR | Status: AC
  Administered 2021-02-18: 15 mg via INTRAVENOUS
  Filled 2021-02-18: qty 1

## 2021-02-18 MED ORDER — DIPHENHYDRAMINE HCL 50 MG/ML IJ SOLN
12.5000 mg | Freq: Three times a day (TID) | INTRAMUSCULAR | Status: DC | PRN
  Administered 2021-02-18 – 2021-02-20 (×5): 12.5 mg via INTRAMUSCULAR
  Filled 2021-02-18 (×6): qty 1

## 2021-02-18 MED ORDER — DIPHENHYDRAMINE HCL 25 MG PO CAPS
25.0000 mg | ORAL_CAPSULE | Freq: Three times a day (TID) | ORAL | Status: DC | PRN
  Filled 2021-02-18: qty 1

## 2021-02-18 MED ORDER — POLYETHYLENE GLYCOL 3350 17 G PO PACK
17.0000 g | PACK | Freq: Every day | ORAL | Status: DC | PRN
  Administered 2021-02-20: 17 g via ORAL
  Filled 2021-02-18: qty 1

## 2021-02-18 MED ORDER — ONDANSETRON HCL 4 MG/2ML IJ SOLN
4.0000 mg | Freq: Once | INTRAMUSCULAR | Status: AC
  Administered 2021-02-18: 4 mg via INTRAVENOUS
  Filled 2021-02-18: qty 2

## 2021-02-18 MED ORDER — SODIUM CHLORIDE 0.9 % IV SOLN: INTRAVENOUS | Status: DC

## 2021-02-18 MED ORDER — ACETAMINOPHEN 325 MG PO TABS: 650.0000 mg | ORAL_TABLET | ORAL | Status: DC | PRN

## 2021-02-18 MED ORDER — HYDROMORPHONE HCL 1 MG/ML IJ SOLN
0.5000 mg | INTRAMUSCULAR | Status: DC | PRN
  Administered 2021-02-18 – 2021-02-20 (×17): 1 mg via INTRAVENOUS
  Filled 2021-02-18 (×17): qty 1

## 2021-02-18 MED ORDER — KETOROLAC TROMETHAMINE 30 MG/ML IJ SOLN
30.0000 mg | Freq: Four times a day (QID) | INTRAMUSCULAR | Status: DC
  Administered 2021-02-18 – 2021-02-20 (×8): 30 mg via INTRAVENOUS
  Filled 2021-02-18 (×8): qty 1

## 2021-02-18 NOTE — H&P (Signed)
H&P  Chief Complaint: refractory L flank pain.   History of Present Illness: Devin Sanders is a 41 y.o. year old who presents to the ED with severe L flank pain.  Devin Sanders is s/p bilateral ureteroscopy with laser litho and stent placement 2/10. 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 He was discharged the next day. He presented to the ED earlier this morning c/o severe L flank pain with associated N/V.  A renal ultrasound was obtained in the ED, which I reviewed and is overall unremarkable (see full report below). He also had a KUB which demonstrates the stents are positioned well bilaterally.   At the time of my exam. Devin Sanders appears uncomfortable. He reports he still has marked discomfort.  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)   History of kidney stones    Left ureteral calculus    Renal calculus, left    Wears glasses     Past Surgical History:  Procedure Laterality Date   CYSTOSCOPY WITH RETROGRADE PYELOGRAM, URETEROSCOPY AND STENT PLACEMENT Bilateral 06/26/2017   Procedure: CYSTOSCOPY WITH RETROGRADE PYELOGRAM, URETEROSCOPY, STONE BASKETRY  AND STENT PLACEMENT;  Surgeon: [name], MD;  Location: [facility];  Service: Urology;  Laterality: Bilateral;   CYSTOSCOPY WITH RETROGRADE PYELOGRAM, URETEROSCOPY AND STENT PLACEMENT Right 09/18/2017   Procedure: CYSTOSCOPY WITH RETROGRADE PYELOGRAM, URETEROSCOPY AND STENT PLACEMENT;  Surgeon: Crist Fat, MD;  Location: [facility];  Service: Urology;  Laterality: Right;   CYSTOSCOPY WITH RETROGRADE PYELOGRAM, URETEROSCOPY AND STENT PLACEMENT Bilateral 10/13/2017   Procedure: CYSTOSCOPY WITH RETROGRADE PYELOGRAM, BILATERAL URETEROSCOPY AND BILATERAL STENT PLACEMENT, LASER;  Surgeon: [name], MD;  Location: [facility];   Service: Urology;  Laterality: Bilateral;  90 MINS   CYSTOSCOPY WITH RETROGRADE PYELOGRAM, URETEROSCOPY AND STENT PLACEMENT Bilateral 03/24/2018   Procedure: CYSTOSCOPY WITH RETROGRADE PYELOGRAM, URETEROSCOPY AND STENT PLACEMENT;  Surgeon: [name], MD;  Location: [facility];  Service: Urology;  Laterality: Bilateral;  1 HR   CYSTOSCOPY WITH RETROGRADE PYELOGRAM, URETEROSCOPY AND STENT PLACEMENT Bilateral 07/07/2018   Procedure: CYSTOSCOPY WITH RETROGRADE PYELOGRAM, URETEROSCOPY AND STENT PLACEMENT;  Surgeon: [name], MD;  Location: [facility];  Service: Urology;  Laterality: Bilateral;  75 MINUTES   CYSTOSCOPY WITH RETROGRADE PYELOGRAM, URETEROSCOPY AND STENT PLACEMENT Left 01/12/2019   Procedure: CYSTOSCOPY WITH RETROGRADE PYELOGRAM, URETEROSCOPY AND STENT PLACEMENT;  Surgeon: [name], MD;  Location: [facility];  Service: Urology;  Laterality: Left;  90 MINS   CYSTOSCOPY WITH RETROGRADE PYELOGRAM, URETEROSCOPY AND STENT PLACEMENT Left 01/28/2019   Procedure: CYSTOSCOPY WITH RETROGRADE PYELOGRAM, URETEROSCOPY AND STENT EXCHANGE STONE BASKET EXTRACTION ;  Surgeon: [name], MD;  Location: [facility];  Service: Urology;  Laterality: Left;   CYSTOSCOPY WITH RETROGRADE PYELOGRAM, URETEROSCOPY AND STENT PLACEMENT Bilateral 05/13/2019   Procedure: CYSTOSCOPY WITH BILATERAL  RETROGRADE PYELOGRAM, LEFT URETEROSCOPY WITH HOLMIUM LASER AND STENT PLACEMENT;  Surgeon: [name], MD;  Location: [facility];  Service: Urology;  Laterality: Bilateral;   CYSTOSCOPY WITH RETROGRADE PYELOGRAM, URETEROSCOPY AND STENT PLACEMENT Bilateral 11/02/2019   Procedure: CYSTOSCOPY WITH RETROGRADE PYELOGRAM, URETEROSCOPY AND STENT PLACEMENT;  Surgeon: [name], MD;  Location: [facility];  Service: Urology;  Laterality: Bilateral;  90 MINS   CYSTOSCOPY WITH RETROGRADE PYELOGRAM, URETEROSCOPY AND STENT PLACEMENT Bilateral 10/12/2020  Procedure: CYSTOSCOPY WITH RETROGRADE PYELOGRAM,  URETEROSCOPY, BASKETING OF STONES AND BILATERAL STENT PLACEMENTS;  Surgeon: [name], MD;  Location: [facility];  Service: Urology;  Laterality: Bilateral;  75 MINS   CYSTOSCOPY WITH RETROGRADE PYELOGRAM, URETEROSCOPY AND STENT PLACEMENT Bilateral 02/15/2021   Procedure: CYSTOSCOPY WITH RETROGRADE PYELOGRAM, URETEROSCOPY AND STENT PLACEMENT;  Surgeon: [name], MD;  Location: [facility];  Service: Urology;  Laterality: Bilateral;  75 MINS   CYSTOSCOPY/RETROGRADE/URETEROSCOPY/STONE EXTRACTION WITH BASKET Left 10/05/2017   Procedure: CYSTOSCOPY/RETROGRADE/STONE REMOVAL FROM BLADDER ;  Surgeon: Bjorn Pippin, MD;  Location: [facility];  Service: Urology;  Laterality: Left;   CYSTOSCOPY/URETEROSCOPY/HOLMIUM LASER/STENT PLACEMENT Right 09/28/2017   Procedure: CYSTOSCOPY/RIGHT RETROGRADE URETEROSCOPY/HOLMIUM LASER/ RIGHT STENT PLACEMENT;  Surgeon: Rene Paci, MD;  Location: [facility];  Service: Urology;  Laterality: Right;   CYSTOSCOPY/URETEROSCOPY/HOLMIUM LASER/STENT PLACEMENT Bilateral 01/27/2018   Procedure: CYSTOSCOPY/URETEROSCOPY/HOLMIUM LASER/STENT PLACEMENT;  Surgeon: [name], MD;  Location: [facility];  Service: Urology;  Laterality: Bilateral;   CYSTOSCOPY/URETEROSCOPY/HOLMIUM LASER/STENT PLACEMENT Right 08/08/2020   Procedure: CYSTOSCOPY/URETEROSCOPY/RETROGRADE/ HOLMIUM LASER/STENT PLACEMENT;  Surgeon: Marcine Matar, MD;  Location: [facility];  Service: Urology;  Laterality: Right;   HOLMIUM LASER APPLICATION Bilateral 06/26/2017   Procedure: HOLMIUM LASER APPLICATION;  Surgeon: [name], MD;  Location: [facility];  Service: Urology;  Laterality: Bilateral;   HOLMIUM LASER APPLICATION Right 09/18/2017   Procedure: HOLMIUM LASER APPLICATION;  Surgeon: Crist Fat, MD;  Location: [facility];  Service: Urology;  Laterality: Right;   HOLMIUM LASER APPLICATION Bilateral 10/13/2017   Procedure: HOLMIUM LASER APPLICATION;  Surgeon: [name],  MD;  Location: [facility];  Service: Urology;  Laterality: Bilateral;   HOLMIUM LASER APPLICATION Bilateral 01/27/2018   Procedure: HOLMIUM LASER APPLICATION;  Surgeon: [name], MD;  Location: [facility];  Service: Urology;  Laterality: Bilateral;   HOLMIUM LASER APPLICATION Bilateral 03/24/2018   Procedure: HOLMIUM LASER APPLICATION;  Surgeon: [name], MD;  Location[facility];  Service: Urology;  Laterality: Bilateral;   HOLMIUM LASER APPLICATION Bilateral 07/07/2018   Procedure: HOLMIUM LASER APPLICATION;  Surgeon: [name], MD;  Location[facility];  Service: Urology;  Laterality: Bilateral;   HOLMIUM LASER APPLICATION Left 01/12/2019   Procedure: HOLMIUM LASER APPLICATION;  Surgeon: [name], MD;  Location[facility];  Service: Urology;  Laterality: Left;   HOLMIUM LASER APPLICATION Bilateral 11/02/2019   Procedure: HOLMIUM LASER APPLICATION;  Surgeon: [name], MD;  Location: [facility];  Service: Urology;  Laterality: Bilateral;   HOLMIUM LASER APPLICATION Bilateral 02/15/2021   Procedure: HOLMIUM LASER APPLICATION;  Surgeon: [name], MD;  Location[facility];  Service: Urology;  Laterality: Bilateral;   PERCUTANEOUS NEPHROSTOLITHOTOMY  2003;  2009;  06-22-2014; 01-30-2015  @ [facility]   URETEROSCOPIC STONE MANIPULATION UNILATERAL  multiple since 1997--  last one 02-19-2017  @ [facility]    Home Medications:  No outpatient medications have been marked as taking for the 02/18/21 encounter [facility] Encounter).    Allergies:  Allergies  Allergen Reactions   Morphine Nausea And Vomiting   Other Hives and Swelling    Mangos    History reviewed. No pertinent family history.  Social History:  reports that he has never smoked. He has never used smokeless tobacco. He reports that he does not drink alcohol and does not use drugs.  ROS: A complete review of systems was performed.  All systems are negative except for pertinent findings as  noted.  Physical Exam:  Vital signs in last 24  hours: Temp:  [98.3 F (36.8 C)] 98.3 F (36.8 C) (02/13 0807) Pulse Rate:  [90-106] 90 (02/13 1445) Resp:  [16-20] 16 (02/13 1445) BP: (120-198)/(91-126) 120/91 (02/13 1445) SpO2:  [92 %-96 %] 92 % (02/13 1445) Weight:  [229 lb 15 oz (104.3 kg)] 229 lb 15 oz (104.3 kg) (02/13 4403) Constitutional:  Alert and oriented, No acute distress Cardiovascular: Regular rate and rhythm, No JVD Respiratory: Normal respiratory effort, Lungs clear bilaterally GI: Abdomen is soft, nontender, nondistended, no abdominal masses GU: No CVA tenderness Lymphatic: No lymphadenopathy Neurologic: Grossly intact, no focal deficits Psychiatric: Normal mood and affect   Laboratory Data:  Recent Labs    02/16/21 0445 02/18/21 0835  WBC  --  7.8  HGB 13.4 14.3  HCT 40.3 39.4  PLT  --  243    Recent Labs    02/18/21 0835  NA 136  K 3.6  CL 104  GLUCOSE 92  BUN 19  CALCIUM 8.6*  CREATININE 1.00     Results for orders placed or performed during the hospital encounter of 02/18/21 (from the past 24 hour(s))  Comprehensive metabolic panel     Status: Abnormal   Collection Time: 02/18/21  8:35 AM  Result Value Ref Range   Sodium 136 135 - 145 mmol/L   Potassium 3.6 3.5 - 5.1 mmol/L   Chloride 104 98 - 111 mmol/L   CO2 26 22 - 32 mmol/L   Glucose, Bld 92 70 - 99 mg/dL   BUN 19 6 - 20 mg/dL   Creatinine, Ser 4.74 0.61 - 1.24 mg/dL   Calcium 8.6 (L) 8.9 - 10.3 mg/dL   Total Protein 6.4 (L) 6.5 - 8.1 g/dL   Albumin 3.7 3.5 - 5.0 g/dL   AST 22 15 - 41 U/L   ALT 27 0 - 44 U/L   Alkaline Phosphatase 65 38 - 126 U/L   Total Bilirubin 0.7 0.3 - 1.2 mg/dL   GFR, Estimated >25 >95 mL/min   Anion gap 6 5 - 15  CBC     Status: Abnormal   Collection Time: 02/18/21  8:35 AM  Result Value Ref Range   WBC 7.8 4.0 - 10.5 K/uL   RBC 4.17 (L) 4.22 - 5.81 MIL/uL   Hemoglobin 14.3 13.0 - 17.0 g/dL   HCT 63.8 75.6 - 43.3 %   MCV 94.5 80.0 - 100.0 fL    MCH 34.3 (H) 26.0 - 34.0 pg   MCHC 36.3 (H) 30.0 - 36.0 g/dL   RDW 29.5 18.8 - 41.6 %   Platelets 243 150 - 400 K/uL   nRBC 0.0 0.0 - 0.2 %  Urinalysis, Routine w reflex microscopic     Status: Abnormal   Collection Time: 02/18/21  8:35 AM  Result Value Ref Range   Color, Urine RED (A) YELLOW   APPearance CLOUDY (A) CLEAR   Specific Gravity, Urine 1.014 1.005 - 1.030   pH 7.0 5.0 - 8.0   Glucose, UA NEGATIVE NEGATIVE mg/dL   Hgb urine dipstick MODERATE (A) NEGATIVE   Bilirubin Urine NEGATIVE NEGATIVE   Ketones, ur NEGATIVE NEGATIVE mg/dL   Protein, ur 606 (A) NEGATIVE mg/dL   Nitrite NEGATIVE NEGATIVE   Leukocytes,Ua TRACE (A) NEGATIVE   RBC / HPF >50 (H) 0 - 5 RBC/hpf   WBC, UA 6-10 0 - 5 WBC/hpf   Bacteria, UA RARE (A) NONE SEEN   No results found for this or any previous visit (from the past 240 hour(s)).  Renal  Function: Recent Labs    02/14/21 1610 02/18/21 0835  CREATININE 1.09 1.00   Estimated Creatinine Clearance: 117.6 mL/min (by C-G formula based on SCr of 1 mg/dL).  Radiologic Imaging: DG Abdomen 1 View  Result Date: 02/18/2021 CLINICAL DATA:  Left flank pain. Patient reports he had 2 kidney stones removed 3 days ago. Increased pain. EXAM: ABDOMEN - 1 VIEW COMPARISON:  Renal ultrasound 02/18/2021, CT abdomen and pelvis without contrast 02/14/2021, KUB 01/19/2019 FINDINGS: There are bilateral nephroureteral stents with the proximal pigtail overlying the bilateral renal pelves and distal pigtails overlying the urinary bladder. These are new compared to 02/14/2021 CT. On the prior 01/19/2019 KUB there was a left nephroureteral stent. Approximally 5 left hemipelvis phleboliths appear unchanged from 01/19/2019 KUB. On recent 02/14/2021 CT, left-greater-than-right renal stones were seen measuring up to 5 mm on the left within the left renal midpole. This stone is likely seen lateral to the left nephroureteral stent and overlying the posterior left twelfth rib. Portions of  each kidney are obscured by overlying stool within the colon. Nonobstructed bowel-gas pattern. The lung bases are clear. No acute skeletal abnormality. IMPRESSION: Bilateral nephroureteral stents. No definite ureteral stone is seen. There were subcentimeter stones within the left-greater-than-right kidneys on prior CT. The largest is likely visualized overlying the lateral left renal midpole. Electronically Signed   By: Neita Garnetonald  Viola M.D.   On: 02/18/2021 14:14   US RENAL  Result Date: 02/18/2021 CLINICAL DATA:  LEFT-sided pain EXAM: RENAL / URINARY TRACT ULTRASOUND COMPLETE COMPARISON:  CT 02/14/2021 FINDINGS: Right Kidney: Renal measurements: 30.2 x 6.0 x 7 cm = volume: 230 mL. 1.2 cm cystic lesion which is anechoic. 8 mm shadowing calculus. No hydronephrosis Left Kidney: Renal measurements: 12.6 x 5.6 x 4 5 cm = volume: 167 mL. 10 mm calculus. No hydronephrosis Bladder: Appears normal for degree of bladder distention. Other: None. IMPRESSION: 1. Bilateral nonobstructing small renal calculi. 2. No hydronephrosis. 3. Bilateral benign appearing renal cysts. 4. Findings similar to CT several days prior. Electronically Signed   By: Genevive BiStewart  Edmunds M.D.   On: 02/18/2021 13:17    Assessment:  41 yo M with history of cystinuria, nephrolithiasis with refractory pain s/p bilateral ureteroscopy with laser litho + stent placement on Friday.   Plan:  Admit to observation for IV pain control.  Will start scheduled Toradol with PRN pain medication.  Regular diet  Irine SealLuke Grady Lucci, MD 02/18/2021, 3:33 PM  [facility] Pager: [phone]

## 2021-02-18 NOTE — ED Triage Notes (Signed)
Patient reports that he had 2 kidney stones removed 3 days ago and has had increased pain. Patient states he also has been vomiting.

## 2021-02-18 NOTE — ED Provider Notes (Signed)
[facility] DEPT Provider Note   CSN: [id number] Arrival date & time: 02/18/21  0800     History  Chief Complaint  Patient presents with   post op issue   Emesis    Devin Sanders is a 41 y.o. male.  41 year old male with prior medical history as detailed below presents for evaluation.  Patient is known to Dr. Tresa Moore of Urology.  Patient with recent bilateral ureteroscopic stone management with endopyelotomy (on left).  Discharged February 11.  Patient returns today with complaint of persistent left flank pain.  Patient complains of persistent nausea and vomiting and uncontrolled pain.  He denies fever.  He denies difficulty making urine.  The history is provided by the patient and medical records.  Emesis Severity:  Moderate Duration:  2 days Timing:  Constant Quality:  Stomach contents Progression:  Unchanged Chronicity:  New     Home Medications Prior to Admission medications   Medication Sig Start Date End Date Taking? Authorizing Provider  ibuprofen (ADVIL) 400 MG tablet Take 1 tablet (400 mg total) by mouth 3 (three) times daily. Take with food. Patient not taking: Reported on 02/04/2021 [id number]   Prince Rome, PA-C  ketorolac (TORADOL) 10 MG tablet Take 1 tablet (10 mg total) by mouth every 8 (eight) hours as needed for moderate pain. 02/15/21   Alexis Frock, MD  PERCOCET 5-325 MG tablet Take 1-2 tablets by mouth every 8 (eight) hours as needed for severe pain. Post-operatively 02/15/21   Alexis Frock, MD  tamsulosin (FLOMAX) 0.4 MG CAPS capsule Take 0.4 mg by mouth daily as needed (Kidney stones).    [provider]      Allergies    Morphine and Other    Review of Systems   Review of Systems  Gastrointestinal:  Positive for vomiting.  All other systems reviewed and are negative.  Physical Exam Updated Vital Signs BP (!) 198/126 (BP Location: Left Arm)    Pulse (!) 106    Temp 98.3 F (36.8 C) (Oral)     Resp 20    Ht 5\' 10"  (1.778 m)    Wt 104.3 kg    SpO2 96%    BMI 32.99 kg/m  Physical Exam Vitals and nursing note reviewed.  Constitutional:      General: He is not in acute distress.    Appearance: Normal appearance. He is well-developed.  HENT:     Head: Normocephalic and atraumatic.  Eyes:     Conjunctiva/sclera: Conjunctivae normal.     Pupils: Pupils are equal, round, and reactive to light.  Cardiovascular:     Rate and Rhythm: Normal rate and regular rhythm.     Heart sounds: Normal heart sounds.  Pulmonary:     Effort: Pulmonary effort is normal. No respiratory distress.     Breath sounds: Normal breath sounds.  Abdominal:     General: There is no distension.     Palpations: Abdomen is soft.     Tenderness: There is no abdominal tenderness.  Genitourinary:    Comments: CVA tenderness present on left Musculoskeletal:        General: No deformity. Normal range of motion.     Cervical back: Normal range of motion and neck supple.  Skin:    General: Skin is warm and dry.  Neurological:     General: No focal deficit present.     Mental Status: He is alert and oriented to person, place, and time.    ED  Results / Procedures / Treatments   Labs (all labs ordered are listed, but only abnormal results are displayed) Labs Reviewed  COMPREHENSIVE METABOLIC PANEL - Abnormal; Notable for the following components:      Result Value   Calcium 8.6 (*)    Total Protein 6.4 (*)    All other components within normal limits  CBC - Abnormal; Notable for the following components:   RBC 4.17 (*)    MCH 34.3 (*)    MCHC 36.3 (*)    All other components within normal limits  URINALYSIS, ROUTINE W REFLEX MICROSCOPIC - Abnormal; Notable for the following components:   Color, Urine RED (*)    APPearance CLOUDY (*)    Hgb urine dipstick MODERATE (*)    Protein, ur 100 (*)    Leukocytes,Ua TRACE (*)    RBC / HPF >50 (*)    Bacteria, UA RARE (*)    All other components within normal  limits    EKG None  Radiology US RENAL  Result Date: 02/18/2021 CLINICAL DATA:  LEFT-sided pain EXAM: RENAL / URINARY TRACT ULTRASOUND COMPLETE COMPARISON:  CT 02/14/2021 FINDINGS: Right Kidney: Renal measurements: 30.2 x 6.0 x 7 cm = volume: 230 mL. 1.2 cm cystic lesion which is anechoic. 8 mm shadowing calculus. No hydronephrosis Left Kidney: Renal measurements: 12.6 x 5.6 x 4 5 cm = volume: 167 mL. 10 mm calculus. No hydronephrosis Bladder: Appears normal for degree of bladder distention. Other: None. IMPRESSION: 1. Bilateral nonobstructing small renal calculi. 2. No hydronephrosis. 3. Bilateral benign appearing renal cysts. 4. Findings similar to CT several days prior. Electronically Signed   By: Suzy Bouchard M.D.   On: 02/18/2021 13:17    Procedures Procedures    Medications Ordered in ED Medications  0.9 %  sodium chloride infusion (has no administration in time range)  sodium chloride 0.9 % bolus 1,000 mL (1,000 mLs Intravenous Bolus 02/18/21 1153)  ondansetron (ZOFRAN) injection 4 mg (4 mg Intravenous Given 02/18/21 1152)  HYDROmorphone (DILAUDID) injection 0.5 mg (0.5 mg Intravenous Given 02/18/21 1153)  ketorolac (TORADOL) 15 MG/ML injection 15 mg (15 mg Intravenous Given 02/18/21 1221)  HYDROmorphone (DILAUDID) injection 1 mg (1 mg Intravenous Given 02/18/21 1220)  diphenhydrAMINE (BENADRYL) injection 25 mg (25 mg Intravenous Given 02/18/21 1300)  HYDROmorphone (DILAUDID) injection 0.5 mg (0.5 mg Intravenous Given 02/18/21 1301)    ED Course/ Medical Decision Making/ A&P                           Medical Decision Making Amount and/or Complexity of Data Reviewed Labs: ordered. Radiology: ordered.  Risk Prescription drug management. Decision regarding hospitalization.    Medical Screen Complete  This patient presented to the ED with complaint of left flank pain status post urologic intervention.  This complaint involves an extensive number of treatment options. The  initial differential diagnosis includes, but is not limited to, stent displacement, infection, hydronephrosis, metabolic abnormality, inadequate pain control, etc  This presentation is: Acute, Self-Limited, Previously Undiagnosed, Uncertain Prognosis, Complicated, Systemic Symptoms, and Threat to Life/Bodily Function  Patient is presenting with complaint of left flank pain after recent urologic intervention.  Patient reports that pain is not controlled at home despite use of oral narcotics and oral antinausea medication.  He denies fever.  Patient with some improvement in symptoms with administration of IV fluids, IV pain medication.  Renal ultrasound is without evidence of hydronephrosis.  Urology services were case and will  evaluate in the ED.  Patient may require admission for pain control.  Co morbidities that complicated the patient's evaluation  History of multiple prior kidney stones   Additional history obtained:  External records from outside sources obtained and reviewed including prior ED visits and prior Inpatient records.    Lab Tests:  I ordered and personally interpreted labs.  The pertinent results include: CBC, BMP, UA   Imaging Studies ordered:  I ordered imaging studies including an ultrasound, KUB I independently visualized and interpreted obtained imaging which showed no hydro, no acute pathology I agree with the radiologist interpretation.    Medicines ordered:  I ordered medication including Dilaudid, Benadryl, Toradol, Zofran for pain Reevaluation of the patient after these medicines showed that the patient: improved     Consultations Obtained:  I consulted urology,  and discussed lab and imaging findings as well as pertinent plan of care.    Problem List / ED Course:  Left flank pain status post urologic intervention   Reevaluation:  After the interventions noted above, I reevaluated the patient and found that they have:  improved   Disposition:  After consideration of the diagnostic results and the patients response to treatment, I feel that the patent would benefit from pending urology evaluation here in the ED.          Final Clinical Impression(s) / ED Diagnoses Final diagnoses:  Pain  Flank pain    Rx / DC Orders ED Discharge Orders     None         Valarie Merino, MD 02/19/21 [phone]

## 2021-02-18 NOTE — Plan of Care (Signed)
New admission into 1521.  Pt Devin Sanders, pleasant and cooperative with staff.  IVF and Pain medication regimen per orders.   Problem: Education: Goal: Knowledge of General Education information will improve Description: Including pain rating scale, medication(s)/side effects and non-pharmacologic comfort measures Outcome: Progressing   Problem: Health Behavior/Discharge Planning: Goal: Ability to manage health-related needs will improve Outcome: Progressing   Problem: Clinical Measurements: Goal: Ability to maintain clinical measurements within normal limits will improve Outcome: Progressing Goal: Will remain free from infection Outcome: Progressing Goal: Diagnostic test results will improve Outcome: Progressing Goal: Respiratory complications will improve Outcome: Progressing Goal: Cardiovascular complication will be avoided Outcome: Progressing   Problem: Activity: Goal: Risk for activity intolerance will decrease Outcome: Progressing   Problem: Nutrition: Goal: Adequate nutrition will be maintained Outcome: Progressing   Problem: Coping: Goal: Level of anxiety will decrease Outcome: Progressing   Problem: Elimination: Goal: Will not experience complications related to bowel motility Outcome: Progressing Goal: Will not experience complications related to urinary retention Outcome: Progressing   Problem: Pain Managment: Goal: General experience of comfort will improve Outcome: Progressing   Problem: Safety: Goal: Ability to remain free from injury will improve Outcome: Progressing   Problem: Skin Integrity: Goal: Risk for impaired skin integrity will decrease Outcome: Progressing

## 2021-02-19 MED ORDER — SODIUM CHLORIDE 0.9 % IV SOLN
12.5000 mg | Freq: Four times a day (QID) | INTRAVENOUS | Status: DC | PRN
  Filled 2021-02-19: qty 0.5

## 2021-02-19 NOTE — TOC CM/SW Note (Signed)
°  Transition of Care [facility]) Screening Note   Patient Details  Name: Devin Sanders Date of Birth: April 29, 1980   Transition of [facility]) CM/SW Contact:    Darleene Cleaver, LCSW Phone Number: 02/19/2021, 11:54 PM    Transition of Care Department [facility]) has reviewed patient and no TOC needs have been identified at this time. We will continue to monitor patient advancement through interdisciplinary progression rounds. If new patient transition needs arise, please place a TOC consult.

## 2021-02-19 NOTE — Progress Notes (Signed)
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 NO high grade fevers. NO leukocytosis, NO sig change in Cr. KUB, Renal US 2/13 with bilat stents in good position, no hydro.   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 ON thiola at baseline (variably compliant)  Today " [name] " is improving. Flank pain better as is nausea on promethazine. No emesis today. Still mild hematuria, no clots with volitional voiding.    Objective: Vital signs in last 24 hours: Temp:  [97.7 F (36.5 C)-99.1 F (37.3 C)] 98.4 F (36.9 C) (02/14 1520) Pulse Rate:  [79-99] 98 (02/14 1520) Resp:  [15-20] 20 (02/14 1520) BP: (111-137)/(79-101) 133/97 (02/14 1520) SpO2:  [92 %-96 %] 96 % (02/14 1520) Last BM Date : 02/17/21  Intake/Output from previous day: 02/13 0701 - 02/14 0700 In: 308.4 [I.V.:308.4] Out: 400 [Urine:400] Intake/Output this shift: Total I/O In: 370 [P.O.:370] Out: 750 [Urine:750]  EXAM: NAD, AOx3, Near baseline Non-labored breathign on RA RRR STable truncal obesity, no severe CVAT Urine tea colored, no clots No c/c/e  Lab Results:  Recent Labs    02/18/21 0835  WBC 7.8  HGB 14.3  HCT 39.4  PLT 243   BMET Recent Labs    02/18/21 0835  NA 136  K 3.6  CL 104  CO2 26  GLUCOSE 92  BUN 19  CREATININE 1.00  CALCIUM 8.6*   PT/INR No results for input(s): LABPROT, INR in the last 72 hours. ABG No results for input(s): PHART, HCO3 in the last 72 hours.  Invalid input(s): PCO2, PO2  Studies/Results: DG Abdomen 1 View  Result Date: 02/18/2021 CLINICAL DATA:  Left flank pain. Patient reports he had 2 kidney stones removed 3 days ago. Increased pain. EXAM: ABDOMEN - 1 VIEW COMPARISON:  Renal ultrasound 02/18/2021, CT abdomen and pelvis without contrast 02/14/2021, KUB 01/19/2019  FINDINGS: There are bilateral nephroureteral stents with the proximal pigtail overlying the bilateral renal pelves and distal pigtails overlying the urinary bladder. These are new compared to 02/14/2021 CT. On the prior 01/19/2019 KUB there was a left nephroureteral stent. Approximally 5 left hemipelvis phleboliths appear unchanged from 01/19/2019 KUB. On recent 02/14/2021 CT, left-greater-than-right renal stones were seen measuring up to 5 mm on the left within the left renal midpole. This stone is likely seen lateral to the left nephroureteral stent and overlying the posterior left twelfth rib. Portions of each kidney are obscured by overlying stool within the colon. Nonobstructed bowel-gas pattern. The lung bases are clear. No acute skeletal abnormality. IMPRESSION: Bilateral nephroureteral stents. No definite ureteral stone is seen. There were subcentimeter stones within the left-greater-than-right kidneys on prior CT. The largest is likely visualized overlying the lateral left renal midpole. Electronically Signed   By: Yvonne Kendall M.D.   On: 02/18/2021 14:14   US RENAL  Result Date: 02/18/2021 CLINICAL DATA:  LEFT-sided pain EXAM: RENAL / URINARY TRACT ULTRASOUND COMPLETE COMPARISON:  CT 02/14/2021 FINDINGS: Right Kidney: Renal measurements: 30.2 x 6.0 x 7 cm = volume: 230 mL. 1.2 cm cystic lesion which is anechoic. 8 mm shadowing calculus. No hydronephrosis Left Kidney: Renal measurements: 12.6 x 5.6 x 4 5 cm = volume: 167 mL. 10 mm calculus. No hydronephrosis Bladder: Appears normal for degree of bladder distention. Other: None. IMPRESSION: 1. Bilateral nonobstructing small renal calculi. 2. No hydronephrosis. 3.  Bilateral benign appearing renal cysts. 4. Findings similar to CT several days prior. Electronically Signed   By: Suzy Bouchard M.D.   On: 02/18/2021 13:17    Anti-infectives: Anti-infectives (From admission, onward)    None       Assessment/Plan:  1 - Pain / Nausea After  Ureteroscopy - no anatomic, infectious complicating factors. Discussed that he can go home whenever symptoms managable, he is close. Have discussed with his wife my concern for some increasing drug-seeking tendencies and she verifies not seeking from multiple providers and really only related to stone episdes which is reassuirng.   2 - Cystinuria - now stone free. Will plan on office stent pull likely in abtou a week as do not want them to encrust.   Alexis Frock 02/19/2021

## 2021-02-19 NOTE — Plan of Care (Signed)

## 2021-02-20 DIAGNOSIS — Z8616 Personal history of COVID-19: Secondary | ICD-10-CM | POA: Diagnosis not present

## 2021-02-20 DIAGNOSIS — Z885 Allergy status to narcotic agent status: Secondary | ICD-10-CM | POA: Diagnosis not present

## 2021-02-20 DIAGNOSIS — Z87442 Personal history of urinary calculi: Secondary | ICD-10-CM | POA: Diagnosis not present

## 2021-02-20 DIAGNOSIS — Z20822 Contact with and (suspected) exposure to covid-19: Secondary | ICD-10-CM | POA: Diagnosis present

## 2021-02-20 DIAGNOSIS — G8918 Other acute postprocedural pain: Secondary | ICD-10-CM | POA: Diagnosis present

## 2021-02-20 DIAGNOSIS — G8929 Other chronic pain: Secondary | ICD-10-CM | POA: Diagnosis present

## 2021-02-20 DIAGNOSIS — Z9114 Patient's other noncompliance with medication regimen: Secondary | ICD-10-CM | POA: Diagnosis not present

## 2021-02-20 DIAGNOSIS — Z79899 Other long term (current) drug therapy: Secondary | ICD-10-CM | POA: Diagnosis not present

## 2021-02-20 DIAGNOSIS — R109 Unspecified abdominal pain: Secondary | ICD-10-CM | POA: Diagnosis present

## 2021-02-20 MED ORDER — PROMETHAZINE HCL 50 MG PO TABS: 50.0000 mg | ORAL_TABLET | Freq: Four times a day (QID) | ORAL | 0 refills | Status: DC | PRN

## 2021-02-20 MED ORDER — SENNOSIDES-DOCUSATE SODIUM 8.6-50 MG PO TABS: 1.0000 | ORAL_TABLET | Freq: Two times a day (BID) | ORAL | 0 refills | Status: DC

## 2021-02-20 MED ORDER — HYDROMORPHONE HCL 2 MG PO TABS: 2.0000 mg | ORAL_TABLET | Freq: Four times a day (QID) | ORAL | 0 refills | Status: DC | PRN

## 2021-02-20 MED ORDER — KETOROLAC TROMETHAMINE 10 MG PO TABS: 10.0000 mg | ORAL_TABLET | Freq: Three times a day (TID) | ORAL | 0 refills | Status: DC | PRN

## 2021-02-20 NOTE — Plan of Care (Signed)
  Problem: Clinical Measurements: Goal: Ability to maintain clinical measurements within normal limits will improve Outcome: Progressing   Problem: Activity: Goal: Risk for activity intolerance will decrease Outcome: Progressing   Problem: Nutrition: Goal: Adequate nutrition will be maintained Outcome: Progressing   Problem: Pain Managment: Goal: General experience of comfort will improve Outcome: Progressing   Problem: Safety: Goal: Ability to remain free from injury will improve Outcome: Progressing   Problem: Skin Integrity: Goal: Risk for impaired skin integrity will decrease Outcome: Progressing   

## 2021-02-20 NOTE — Discharge Summary (Signed)
Physician Discharge Summary  Patient ID: Devin Sanders MRN: [id number] DOB/AGE: [birth year] 41 y.o.  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 NO high grade fevers. NO leukocytosis, NO sig change in Cr. KUB, Renal US 2/13 with bilat stents in good position, no hydro. Improved on regimen of IV hydromorphone and promethazine.   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 ON thiola at baseline (variably compliant)  By the early afternoon of 02/20/2021, he is ambulatory, pain manageable, voiding without problems, and felt to be adequate for discharge.   Consults: None  Significant Diagnostic Studies: labs: as per above  Treatments: IV hydration + pain meds + nausea meds  Discharge Exam: Blood pressure (!) 142/93, pulse 79, temperature 97.7 F (36.5 C), temperature source Oral, resp. rate 20, height 5\' 10"  (1.778 m), weight 104.3 kg, SpO2 96 %. General appearance: alert, cooperative, appears stated age, and at baseline.  Eyes: negative Nose: Nares normal. Septum midline. Mucosa normal. No drainage or sinus tenderness. Throat: lips, mucosa, and tongue normal; teeth and gums normal Neck: supple, symmetrical, trachea midline Back: symmetric, no curvature. ROM normal. No CVA tenderness. Resp: non-labored on room air Cardio: NL rate GI: stable truncal obesity.  Male genitalia: normal Extremities: extremities normal, atraumatic, no cyanosis or edema  Disposition:  There are no questions and answers to display.      HOME     Follow-up Information     Alexis Frock, MD Follow up.   Specialty: Urology Why: Office  will call to arrange stent removal next week Contact information: [place] [address] [phone]                 Signed: Alexis Frock 02/20/2021, 1:21 PM

## 2021-02-20 NOTE — Discharge Instructions (Signed)
1 - You may have urinary urgency (bladder spasms) and bloody urine on / off with stent in place. This is normal. ° °2 - Call MD or go to ER for fever >102, severe pain / nausea / vomiting not relieved by medications, or acute change in medical status ° °

## 2021-02-20 NOTE — Progress Notes (Signed)
AVS given and reviewed with pt. Medications discussed. All questions answered to satisfaction. Pt verbalized understanding of information given. Pt to be escorted off the unit with all belongings via wheelchair by staff member.  

## 2021-02-24 ENCOUNTER — Encounter (HOSPITAL_COMMUNITY): Payer: Self-pay

## 2021-02-24 ENCOUNTER — Emergency Department (HOSPITAL_COMMUNITY): Payer: PRIVATE HEALTH INSURANCE

## 2021-02-24 ENCOUNTER — Emergency Department (HOSPITAL_COMMUNITY)
Admission: EM | Admit: 2021-02-24 | Discharge: 2021-02-24 | Disposition: A | Discharge status: Home / Self Care | Payer: PRIVATE HEALTH INSURANCE | Attending: Emergency Medicine | Admitting: Emergency Medicine

## 2021-02-24 DIAGNOSIS — Z87442 Personal history of urinary calculi: Secondary | ICD-10-CM | POA: Insufficient documentation

## 2021-02-24 DIAGNOSIS — R112 Nausea with vomiting, unspecified: Secondary | ICD-10-CM | POA: Diagnosis not present

## 2021-02-24 DIAGNOSIS — R339 Retention of urine, unspecified: Secondary | ICD-10-CM | POA: Insufficient documentation

## 2021-02-24 DIAGNOSIS — R109 Unspecified abdominal pain: Secondary | ICD-10-CM | POA: Insufficient documentation

## 2021-02-24 LAB — COMPREHENSIVE METABOLIC PANEL
ALT: 43 U/L (ref 0–44)
AST: 27 U/L (ref 15–41)
Albumin: 4.3 g/dL (ref 3.5–5.0)
Alkaline Phosphatase: 86 U/L (ref 38–126)
Anion gap: 6 (ref 5–15)
BUN: 20 mg/dL (ref 6–20)
CO2: 23 mmol/L (ref 22–32)
Calcium: 9.4 mg/dL (ref 8.9–10.3)
Chloride: 107 mmol/L (ref 98–111)
Creatinine, Ser: 1.15 mg/dL (ref 0.61–1.24)
GFR, Estimated: >60 mL/min (ref >60)
Glucose, Bld: 108 mg/dL — ABNORMAL HIGH (ref 70–99)
Potassium: 4.4 mmol/L (ref 3.5–5.1)
Sodium: 136 mmol/L (ref 135–145)
Total Bilirubin: 0.9 mg/dL (ref 0.3–1.2)
Total Protein: 7.3 g/dL (ref 6.5–8.1)

## 2021-02-24 LAB — CBC WITH DIFFERENTIAL/PLATELET
Abs Immature Granulocytes: 0.03 10*3/uL (ref 0.00–0.07)
Basophils Absolute: 0 10*3/uL (ref 0.0–0.1)
Basophils Relative: 0 %
Eosinophils Absolute: 0.2 10*3/uL (ref 0.0–0.5)
Eosinophils Relative: 2 %
HCT: 43.7 % (ref 39.0–52.0)
Hemoglobin: 14.7 g/dL (ref 13.0–17.0)
Immature Granulocytes: 0 %
Lymphocytes Relative: 12 %
Lymphs Abs: 1.1 10*3/uL (ref 0.7–4.0)
MCH: 32.2 pg (ref 26.0–34.0)
MCHC: 33.6 g/dL (ref 30.0–36.0)
MCV: 95.8 fL (ref 80.0–100.0)
Monocytes Absolute: 0.6 10*3/uL (ref 0.1–1.0)
Monocytes Relative: 6 %
Neutro Abs: 7.4 10*3/uL (ref 1.7–7.7)
Neutrophils Relative %: 80 %
Platelets: 293 10*3/uL (ref 150–400)
RBC: 4.56 MIL/uL (ref 4.22–5.81)
RDW: 11.7 % (ref 11.5–15.5)
WBC: 9.3 10*3/uL (ref 4.0–10.5)
nRBC: 0 % (ref 0.0–0.2)

## 2021-02-24 LAB — URINALYSIS, ROUTINE W REFLEX MICROSCOPIC
Bacteria, UA: NONE SEEN
Bilirubin Urine: NEGATIVE
Glucose, UA: NEGATIVE mg/dL
Ketones, ur: NEGATIVE mg/dL
Nitrite: NEGATIVE
Protein, ur: >=300 mg/dL — AB
RBC / HPF: >50 RBC/hpf — ABNORMAL HIGH (ref 0–5)
Specific Gravity, Urine: 1.02 (ref 1.005–1.030)
WBC, UA: >50 WBC/hpf — ABNORMAL HIGH (ref 0–5)
pH: 7 (ref 5.0–8.0)

## 2021-02-24 MED ORDER — HYDROMORPHONE HCL 1 MG/ML IJ SOLN
1.0000 mg | Freq: Once | INTRAMUSCULAR | Status: AC
  Administered 2021-02-24: 1 mg via INTRAVENOUS
  Filled 2021-02-24: qty 1

## 2021-02-24 MED ORDER — KETOROLAC TROMETHAMINE 15 MG/ML IJ SOLN
15.0000 mg | Freq: Once | INTRAMUSCULAR | Status: AC
  Administered 2021-02-24: 15 mg via INTRAVENOUS
  Filled 2021-02-24: qty 1

## 2021-02-24 MED ORDER — ONDANSETRON 4 MG PO TBDP: 4.0000 mg | ORAL_TABLET | Freq: Three times a day (TID) | ORAL | 0 refills | Status: DC | PRN

## 2021-02-24 MED ORDER — KETOROLAC TROMETHAMINE 10 MG PO TABS: 10.0000 mg | ORAL_TABLET | Freq: Three times a day (TID) | ORAL | 0 refills | Status: DC | PRN

## 2021-02-24 MED ORDER — SODIUM CHLORIDE 0.9 % IV BOLUS
1000.0000 mL | Freq: Once | INTRAVENOUS | Status: AC
  Administered 2021-02-24: 1000 mL via INTRAVENOUS

## 2021-02-24 MED ORDER — DIPHENHYDRAMINE HCL 50 MG/ML IJ SOLN
25.0000 mg | Freq: Once | INTRAMUSCULAR | Status: AC
  Administered 2021-02-24: 25 mg via INTRAVENOUS
  Filled 2021-02-24: qty 1

## 2021-02-24 MED ORDER — OXYCODONE-ACETAMINOPHEN 5-325 MG PO TABS
1.0000 | ORAL_TABLET | Freq: Once | ORAL | Status: AC
  Administered 2021-02-24: 1 via ORAL
  Filled 2021-02-24: qty 1

## 2021-02-24 MED ORDER — ONDANSETRON HCL 4 MG/2ML IJ SOLN
4.0000 mg | Freq: Once | INTRAMUSCULAR | Status: AC
  Administered 2021-02-24: 4 mg via INTRAVENOUS
  Filled 2021-02-24: qty 2

## 2021-02-24 MED ORDER — HYDROMORPHONE HCL 2 MG PO TABS: 2.0000 mg | ORAL_TABLET | Freq: Four times a day (QID) | ORAL | 0 refills | Status: AC | PRN

## 2021-02-24 MED ORDER — TAMSULOSIN HCL 0.4 MG PO CAPS: 0.4000 mg | ORAL_CAPSULE | Freq: Every day | ORAL | 0 refills | Status: DC

## 2021-02-24 MED ORDER — KETOROLAC TROMETHAMINE 60 MG/2ML IM SOLN
60.0000 mg | Freq: Once | INTRAMUSCULAR | Status: AC
  Administered 2021-02-24: 60 mg via INTRAMUSCULAR
  Filled 2021-02-24: qty 2

## 2021-02-24 NOTE — Discharge Instructions (Signed)
As we discussed, your work-up in the ER today was reassuring for acute abnormalities.  Suspect that your pain is due to the stents that you have in place.  I have discussed your case with our on-call urologist to recommends at home medication management for your pain.  I also recommend that you call urology tomorrow to see if they can move up your appointment for your stent removal.  In the interim, in addition to your prescribed pain medication I also recommend that you take 1000 mg of Tylenol every 6 hours.  Please also ensure you are taking your Flomax daily and take your prescribed Toradol with meals.  Return if development of any new or worsening symptoms.

## 2021-02-24 NOTE — ED Triage Notes (Addendum)
Patient states he was discharged from the hospital 4 days ago and had stents placed. Patient is now having increased left flank pain, emesis, and voiding smaller amounts. Patient  added that he has been urinating blood clots.

## 2021-02-24 NOTE — ED Provider Notes (Signed)
[facility] Provider Note   CSN: [id number] Arrival date & time: 02/24/21  1256     History  Chief Complaint  Patient presents with   Flank Pain   Emesis   Urinary Retention    Devin Sanders is a 41 y.o. male.  Patient with history of many kidney stones presents today with complaint of left flank pain. He states that same has been ongoing since yesterday evening. Of note, patient has had multiple kidney stones in the past with several interventions for removal, the last being with ureteral stenting on 2/10 that was performed without complication. Was discharged on 2/11 and then subsequently admitted for pain control related to these stents from 2/13-2/15 without any acute findings. States that he has been successful at managing with his home pain regimen but developed new nausea and vomiting last night that continued into today. He was therefore unable to keep down his anti-emetics and pain control and had subsequent uncontrolled pain. Also states that he is urinating less and passing more clots. Denies any fevers or chills. States that his pain is extremely similar to when he was admitted on 2/13. Patient has appointment with urology on Thursday to have his stents removed.  The history is provided by the patient. No language interpreter was used.  Flank Pain Pertinent negatives include no chest pain and no shortness of breath.  Emesis Associated symptoms: no chills, no diarrhea and no fever       Home Medications Prior to Admission medications   Medication Sig Start Date End Date Taking? Authorizing Provider  HYDROmorphone (DILAUDID) 2 MG tablet Take 1 tablet (2 mg total) by mouth every 6 (six) hours as needed for up to 5 days for severe pain (Post-operatively). 02/20/21 02/25/21  Sebastian AcheManny, Theodore, MD  ketorolac (TORADOL) 10 MG tablet Take 1 tablet (10 mg total) by mouth every 8 (eight) hours as needed for moderate pain. 02/20/21   Sebastian AcheManny, Theodore,  MD  promethazine (PHENERGAN) 50 MG tablet Take 1 tablet (50 mg total) by mouth every 6 (six) hours as needed for nausea or vomiting. 02/20/21   Sebastian AcheManny, Theodore, MD  senna-docusate (SENOKOT-S) 8.6-50 MG tablet Take 1 tablet by mouth 2 (two) times daily. While taking strongest pain meds to prevent constipation 02/20/21   Sebastian AcheManny, Theodore, MD      Allergies    Morphine and Other    Review of Systems   Review of Systems  Constitutional:  Negative for chills and fever.  Respiratory:  Negative for shortness of breath.   Cardiovascular:  Negative for chest pain.  Gastrointestinal:  Positive for nausea and vomiting. Negative for diarrhea.  Genitourinary:  Positive for flank pain.  All other systems reviewed and are negative.  Physical Exam Updated Vital Signs BP (!) 157/103 (BP Location: Right Arm)    Temp 98.1 F (36.7 C) (Oral)    Resp 18    Ht 5\' 10"  (1.778 m)    Wt 104.3 kg    SpO2 94%    BMI 32.99 kg/m  Physical Exam Vitals and nursing note reviewed.  Constitutional:      General: He is not in acute distress.    Appearance: Normal appearance. He is obese. He is not ill-appearing, toxic-appearing or diaphoretic.     Comments: Patient resting comfortably in bed in no acute distress  HENT:     Head: Normocephalic and atraumatic.  Cardiovascular:     Rate and Rhythm: Normal rate.  Pulmonary:  Effort: Pulmonary effort is normal. No respiratory distress.  Abdominal:     General: Abdomen is flat.     Palpations: Abdomen is soft.     Tenderness: There is left CVA tenderness.  Musculoskeletal:        General: Normal range of motion.     Cervical back: Normal range of motion.  Skin:    General: Skin is warm and dry.  Neurological:     General: No focal deficit present.     Mental Status: He is alert.  Psychiatric:        Mood and Affect: Mood normal.        Behavior: Behavior normal.    ED Results / Procedures / Treatments   Labs (all labs ordered are listed, but only abnormal  results are displayed) Labs Reviewed  COMPREHENSIVE METABOLIC PANEL - Abnormal; Notable for the following components:      Result Value   Glucose, Bld 108 (*)    All other components within normal limits  URINALYSIS, ROUTINE W REFLEX MICROSCOPIC - Abnormal; Notable for the following components:   Color, Urine AMBER (*)    APPearance CLOUDY (*)    Hgb urine dipstick LARGE (*)    Protein, ur >=300 (*)    Leukocytes,Ua MODERATE (*)    RBC / HPF >50 (*)    WBC, UA >50 (*)    All other components within normal limits  CBC WITH DIFFERENTIAL/PLATELET    EKG None  Radiology DG Abdomen 1 View  Result Date: 02/24/2021 CLINICAL DATA:  Kidney stones, left-sided flank pain, recent stent placement EXAM: ABDOMEN - 1 VIEW COMPARISON:  02/18/2021 FINDINGS: Bilateral double-J ureteral stent catheters remain in unchanged position, with formed pigtails in the renal pelves and retrieval loops in the bladder. Small calculi project in the left kidney, unchanged. Phleboliths in the pelvis. Nonobstructive pattern of bowel gas. IMPRESSION: Bilateral double-J ureteral stent catheters remain in unchanged position. Left nephrolithiasis. Electronically Signed   By: Jearld Lesch M.D.   On: 02/24/2021 14:47   US RENAL  Result Date: 02/24/2021 CLINICAL DATA:  41 year old male with acute flank pain. History of nephrolithiasis. EXAM: RENAL / URINARY TRACT ULTRASOUND COMPLETE COMPARISON:  02/18/2021 ultrasound.  02/14/2021 CT. FINDINGS: Right Kidney: Renal measurements: 13.5 x 5.7 x 5.6 cm = volume: 224 mL. Echogenicity within normal limits. There is no evidence of hydronephrosis or solid mass. No definite calculi are identified although sensitivity is limited on this study. A 1.4 cm cyst is present. Left Kidney: Renal measurements: 12.7 x 5.5 x 5.1 cm = volume: 185 ML. Echogenicity within normal limits. There is no evidence of hydronephrosis or solid mass. A 7 mm nonobstructing mid LEFT renal calculus is identified.  Bladder: A 2 cm hyperechoic area along the bladder base is noted and may represent mild prostate impression or debris. Normal bilateral ureteral jets are identified. Other: None. IMPRESSION: 1. No evidence of hydronephrosis. Nonobstructing LEFT renal calculus identified. 2. 2 cm hyperechoic area along the bladder base-suspect mild prostate impression or possibly debris. Consider follow-up. Electronically Signed   By: Harmon Pier M.D.   On: 02/24/2021 15:33    Procedures Procedures    Medications Ordered in ED Medications  oxyCODONE-acetaminophen (PERCOCET/ROXICET) 5-325 MG per tablet 1 tablet (has no administration in time range)  sodium chloride 0.9 % bolus 1,000 mL (1,000 mLs Intravenous New Bag/Given 02/24/21 1439)  HYDROmorphone (DILAUDID) injection 1 mg (1 mg Intravenous Given 02/24/21 1438)  ketorolac (TORADOL) 15 MG/ML injection 15 mg (15  mg Intravenous Given 02/24/21 1437)  ondansetron (ZOFRAN) injection 4 mg (4 mg Intravenous Given 02/24/21 1435)  diphenhydrAMINE (BENADRYL) injection 25 mg (25 mg Intravenous Given 02/24/21 1436)  HYDROmorphone (DILAUDID) injection 1 mg (1 mg Intravenous Given 02/24/21 1608)  ketorolac (TORADOL) injection 60 mg (60 mg Intramuscular Given 02/24/21 1725)    ED Course/ Medical Decision Making/ A&P                           Medical Decision Making  This patient presents to the ED for concern of left flank pain, this involves an extensive number of treatment options, and is a complaint that carries with it a high risk of complications and morbidity.  The differential diagnosis includes complications from stent placement, infection, hydronephrosis, inadequate pain control   Co morbidities that complicate the patient evaluation  Significant history of nephrolithiasis with several procedures to remove stones   Additional history obtained:  Additional history obtained from previous admission notes  Lab Tests:  I Ordered, and personally interpreted labs.   The pertinent results include:  no leukocytosis or anemia. No electrolyte abnormalities, no changes in kidney function. UA with RBC, protein, and leukocytes, no significant change from previous   Imaging Studies ordered:  I ordered imaging studies including renal US, KUB  I independently visualized and interpreted imaging which showed no hydronephrosis, no acute abnormalities I agree with the radiologist interpretation   Medicines ordered and prescription drug management:  I ordered medication including Dilaudid, Benadryl, Toradol, and Zofran  for pain and nausea/vomiting  Reevaluation of the patient after these medicines showed that the patient improved I have reviewed the patients home medicines and have made adjustments as needed   Consultations Obtained:  I requested consultation with urology,  and discussed lab and imaging findings as well as pertinent plan - they recommend: outpatient management with home pain medication and moving up his appointment for stent removal   Problem List / ED Course:  Left flank pain status post bilateral urologic stenting   Reevaluation:  After the interventions noted above, I reevaluated the patient and found that they have :improved   Dispostion:  After consideration of the diagnostic results and the patients response to treatment, I feel that the patent would benefit from discharge with home pain medication and close urology follow-up.  Patient is afebrile, non-toxic appearing, and in no acute distress with reassuring vital signs. No signs of urosepsis. Some bacteria on UA, however patient with no UTI symptoms. Therefore will defer treatment at this time. Plan for patient to call urology tomorrow to see if they can move up his stent removal as suspect this is the cause of the patients pain. In the interim will recommend Tylenol, ODT Zofran, pain medication, and toradol for pain and nausea with continued Flomax as well. Patient is  understanding and amenable with plan, educated on red flag symptoms that would prompt immediate return. Discharged in stable condition.  Findings and plan of care discussed with supervising physician Dr. Rodena Medin who is in agreement.    Final Clinical Impression(s) / ED Diagnoses Final diagnoses:  Flank pain with history of urolithiasis    Rx / DC Orders ED Discharge Orders          Ordered    ondansetron (ZOFRAN-ODT) 4 MG disintegrating tablet  Every 8 hours PRN        02/24/21 1817    ketorolac (TORADOL) 10 MG tablet  Every 8 hours PRN  02/24/21 1817    HYDROmorphone (DILAUDID) 2 MG tablet  Every 6 hours PRN        02/24/21 1817    tamsulosin (FLOMAX) 0.4 MG CAPS capsule  Daily        02/24/21 1817          An After Visit Summary was printed and given to the patient.     Vear Clock 02/24/21 Myrtis Ser, MD 02/24/21 [phone]

## 2021-02-24 NOTE — ED Notes (Signed)
Blue, light green, dark green and lavender tubes collected at 1325 and sent w/ pt labels to the lab.

## 2021-09-09 ENCOUNTER — Other Ambulatory Visit: Payer: Self-pay

## 2021-09-09 ENCOUNTER — Emergency Department (HOSPITAL_COMMUNITY)
Admission: EM | Admit: 2021-09-09 | Discharge: 2021-09-09 | Disposition: A | Discharge status: Home / Self Care | Payer: PRIVATE HEALTH INSURANCE | Attending: Emergency Medicine | Admitting: Emergency Medicine

## 2021-09-09 ENCOUNTER — Emergency Department (HOSPITAL_COMMUNITY): Payer: PRIVATE HEALTH INSURANCE

## 2021-09-09 DIAGNOSIS — N23 Unspecified renal colic: Secondary | ICD-10-CM | POA: Insufficient documentation

## 2021-09-09 DIAGNOSIS — R1032 Left lower quadrant pain: Secondary | ICD-10-CM | POA: Diagnosis present

## 2021-09-09 LAB — URINALYSIS, ROUTINE W REFLEX MICROSCOPIC
Bilirubin Urine: NEGATIVE
Glucose, UA: NEGATIVE mg/dL
Hgb urine dipstick: NEGATIVE
Ketones, ur: NEGATIVE mg/dL
Leukocytes,Ua: NEGATIVE
Nitrite: NEGATIVE
Protein, ur: NEGATIVE mg/dL
Specific Gravity, Urine: 1.014 (ref 1.005–1.030)
pH: 7 (ref 5.0–8.0)

## 2021-09-09 LAB — CBC WITH DIFFERENTIAL/PLATELET
Abs Immature Granulocytes: 0 10*3/uL (ref 0.00–0.07)
Basophils Absolute: 0 10*3/uL (ref 0.0–0.1)
Basophils Relative: 0 %
Eosinophils Absolute: 0.1 10*3/uL (ref 0.0–0.5)
Eosinophils Relative: 1 %
HCT: 41.3 % (ref 39.0–52.0)
Hemoglobin: 14.4 g/dL (ref 13.0–17.0)
Immature Granulocytes: 0 %
Lymphocytes Relative: 29 %
Lymphs Abs: 1.5 10*3/uL (ref 0.7–4.0)
MCH: 33.8 pg (ref 26.0–34.0)
MCHC: 34.9 g/dL (ref 30.0–36.0)
MCV: 96.9 fL (ref 80.0–100.0)
Monocytes Absolute: 0.6 10*3/uL (ref 0.1–1.0)
Monocytes Relative: 11 %
Neutro Abs: 2.9 10*3/uL (ref 1.7–7.7)
Neutrophils Relative %: 59 %
Platelets: 245 10*3/uL (ref 150–400)
RBC: 4.26 MIL/uL (ref 4.22–5.81)
RDW: 11.3 % — ABNORMAL LOW (ref 11.5–15.5)
WBC: 5 10*3/uL (ref 4.0–10.5)
nRBC: 0 % (ref 0.0–0.2)

## 2021-09-09 LAB — COMPREHENSIVE METABOLIC PANEL
ALT: 21 U/L (ref 0–44)
AST: 19 U/L (ref 15–41)
Albumin: 4.3 g/dL (ref 3.5–5.0)
Alkaline Phosphatase: 64 U/L (ref 38–126)
Anion gap: 9 (ref 5–15)
BUN: 21 mg/dL — ABNORMAL HIGH (ref 6–20)
CO2: 23 mmol/L (ref 22–32)
Calcium: 9.2 mg/dL (ref 8.9–10.3)
Chloride: 107 mmol/L (ref 98–111)
Creatinine, Ser: 0.93 mg/dL (ref 0.61–1.24)
GFR, Estimated: >60 mL/min (ref >60)
Glucose, Bld: 96 mg/dL (ref 70–99)
Potassium: 3.7 mmol/L (ref 3.5–5.1)
Sodium: 139 mmol/L (ref 135–145)
Total Bilirubin: 0.8 mg/dL (ref 0.3–1.2)
Total Protein: 7.2 g/dL (ref 6.5–8.1)

## 2021-09-09 MED ORDER — HYDROMORPHONE HCL 2 MG/ML IJ SOLN
1.0000 mg | Freq: Once | INTRAMUSCULAR | Status: AC
  Administered 2021-09-09: 1 mg via INTRAVENOUS
  Filled 2021-09-09: qty 1

## 2021-09-09 MED ORDER — DIPHENHYDRAMINE HCL 50 MG/ML IJ SOLN
12.5000 mg | Freq: Once | INTRAMUSCULAR | Status: AC
  Administered 2021-09-09: 12.5 mg via INTRAVENOUS
  Filled 2021-09-09: qty 1

## 2021-09-09 MED ORDER — SODIUM CHLORIDE 0.9 % IV BOLUS
1000.0000 mL | Freq: Once | INTRAVENOUS | Status: AC
  Administered 2021-09-09: 1000 mL via INTRAVENOUS

## 2021-09-09 MED ORDER — KETOROLAC TROMETHAMINE 30 MG/ML IJ SOLN
15.0000 mg | Freq: Once | INTRAMUSCULAR | Status: AC
  Administered 2021-09-09: 15 mg via INTRAVENOUS
  Filled 2021-09-09: qty 1

## 2021-09-09 MED ORDER — ONDANSETRON 4 MG PO TBDP: 4.0000 mg | ORAL_TABLET | Freq: Three times a day (TID) | ORAL | 0 refills | Status: DC | PRN

## 2021-09-09 MED ORDER — OXYCODONE-ACETAMINOPHEN 5-325 MG PO TABS
1.0000 | ORAL_TABLET | Freq: Four times a day (QID) | ORAL | 0 refills | Status: DC | PRN
Start: 2021-09-09 — End: 2021-09-27

## 2021-09-09 NOTE — ED Provider Notes (Signed)
[facility]-EMERGENCY DEPT Provider Note   CSN: [id number] Arrival date & time: 09/09/21  0503     History  Chief Complaint  Patient presents with   Groin Pain    Devin Sanders is a 41 y.o. male.  The history is provided by the patient and medical records.  Groin Pain  Devin Sanders is a 41 y.o. male who presents to the Emergency Department complaining of kidney stone.  He presents to the emergency department with left lower quadrant abdominal pain that radiates to the penis.  Pain is similar to prior kidney stones.  On Saturday he passed the stone and felt okay on Sunday.  At 1 AM he awoke with severe recurrent left lower quadrant pain.  No associated fever.  He does have nausea and vomiting secondary to pain.  No dysuria.  He has required multiple prior procedures for kidney stones. He follows at [facility] urology.     Home Medications Prior to Admission medications   Medication Sig Start Date End Date Taking? Authorizing Provider  acetaminophen (TYLENOL) 325 MG tablet Take 650 mg by mouth as needed (pain).   Yes [provider]  ibuprofen (ADVIL) 200 MG tablet Take 600 mg by mouth as needed (pain).   Yes [provider]  THIOLA EC 300 MG TBEC Take 300 mg by mouth in the morning, at noon, and at bedtime. 08/30/21  Yes [provider]  senna-docusate (SENOKOT-S) 8.6-50 MG tablet Take 1 tablet by mouth 2 (two) times daily. While taking strongest pain meds to prevent constipation Patient not taking: Reported on 09/09/2021 02/20/21   [name], MD  tamsulosin (FLOMAX) 0.4 MG CAPS capsule Take 1 capsule (0.4 mg total) by mouth daily. Patient not taking: Reported on 09/09/2021 02/24/21   Smoot, Shawn Route, PA-C      Allergies    Morphine and Other    Review of Systems   Review of Systems  All other systems reviewed and are negative.   Physical Exam Updated Vital Signs BP (!) 144/93   Pulse 71   Temp 97.9 F (36.6 C) (Oral)    Resp 16   SpO2 95%  Physical Exam Vitals and nursing note reviewed.  Constitutional:      Appearance: He is well-developed.  HENT:     Head: Normocephalic and atraumatic.  Cardiovascular:     Rate and Rhythm: Normal rate and regular rhythm.     Heart sounds: No murmur heard. Pulmonary:     Effort: Pulmonary effort is normal. No respiratory distress.     Breath sounds: Normal breath sounds.  Abdominal:     Palpations: Abdomen is soft.     Tenderness: There is no guarding or rebound.     Comments: Mild LLQ tenderness  Musculoskeletal:        General: No tenderness.  Skin:    General: Skin is warm and dry.  Neurological:     Mental Status: He is alert and oriented to person, place, and time.  Psychiatric:        Behavior: Behavior normal.     ED Results / Procedures / Treatments   Labs (all labs ordered are listed, but only abnormal results are displayed) Labs Reviewed  COMPREHENSIVE METABOLIC PANEL - Abnormal; Notable for the following components:      Result Value   BUN 21 (*)    All other components within normal limits  CBC WITH DIFFERENTIAL/PLATELET - Abnormal; Notable for the following components:  RDW 11.3 (*)    All other components within normal limits  URINALYSIS, ROUTINE W REFLEX MICROSCOPIC - Abnormal; Notable for the following components:   Color, Urine STRAW (*)    All other components within normal limits    EKG None  Radiology DG Abdomen 1 View  Result Date: 09/09/2021 CLINICAL DATA:  Initial evaluation for acute left-sided flank pain. EXAM: ABDOMEN - 1 VIEW COMPARISON:  Prior study from 05/30/2021. FINDINGS: Few small calcific densities measuring up to 7 mm noted overlying the left renal shadow, consistent with nephrolithiasis. No other there is an additional 6 mm calcific density overlying the left sacrum, new from prior, and could reflect a distal ureteral stone. No convincing right-sided calculi are seen. Few pelvic phleboliths overlie the left  hemipelvis. Bowel gas pattern is nonobstructive and within normal limits. No other soft tissue mass or abnormal calcification. Visualized osseous structures within normal limits. IMPRESSION: 1. Few small calcific densities overlying the left renal shadow, consistent with nephrolithiasis. 2. 6 mm calcific density overlying the left sacrum, new from prior, and could reflect a distal ureteral stone. Further evaluation with dedicated cross-sectional imaging could be performed for further evaluation as warranted. Electronically Signed   By: Rise Mu M.D.   On: 09/09/2021 06:35   US RENAL  Result Date: 09/09/2021 CLINICAL DATA:  Initial evaluation for flank pain. History of kidney stones. EXAM: RENAL / URINARY TRACT ULTRASOUND COMPLETE COMPARISON:  Prior ultrasound from 02/24/2021. FINDINGS: Right Kidney: Renal measurements: 14.7 x 5.8 x 5.5 cm = volume: 243.2 mL. Right kidney demonstrates a somewhat lobulated contour. Renal echogenicity within normal limits. No nephrolithiasis or hydronephrosis. 1.1 x 1.1 x 1.4 cm benign appearing cyst noted at the interpolar region, stable. Left Kidney: Renal measurements: 12.1 x 6.1 x 6.4 cm = volume: 248.9 mL. Left kidney demonstrates a lobulated contour. Renal echogenicity within normal limits. 15 mm nonobstructive calculus present at the upper pole. An additional 9 mm nonobstructive calculus present at the interpolar region. No hydronephrosis. Bladder: Appears normal for degree of bladder distention. Other: None. IMPRESSION: 1. Nonobstructive left renal nephrolithiasis measuring up to 15 mm as above. No hydronephrosis. 2. 1.4 cm benign appearing right renal cyst, stable. Electronically Signed   By: Rise Mu M.D.   On: 09/09/2021 06:30    Procedures Procedures    Medications Ordered in ED Medications  HYDROmorphone (DILAUDID) injection 1 mg (has no administration in time range)  sodium chloride 0.9 % bolus 1,000 mL (has no administration in time  range)  ketorolac (TORADOL) 30 MG/ML injection 15 mg (15 mg Intravenous Given 09/09/21 1610)  HYDROmorphone (DILAUDID) injection 1 mg (1 mg Intravenous Given 09/09/21 0609)  diphenhydrAMINE (BENADRYL) injection 12.5 mg (12.5 mg Intravenous Given 09/09/21 9604)    ED Course/ Medical Decision Making/ A&P                           Medical Decision Making Amount and/or Complexity of Data Reviewed Labs: ordered. Radiology: ordered.  Risk Prescription drug management.   Patient with history of recurrent kidney stones here for evaluation of left lower quadrant pain.  On record review he has had numerous CT scans in the past.  Discussed risk and benefits of additional CT scan at this time and will defer CT.  Ultrasound without hydronephrosis.  KUB does demonstrate a 6 mm calcific density in the left sacrum.  UA is not consistent with UTI.  BMP with normal renal function.  After  treatment with Toradol, Dilaudid patient with persistent but partially improved pain.  Plan to treat with additional pain meds, IV fluids.  Patient care transferred pending pain control and reassessment.        Final Clinical Impression(s) / ED Diagnoses Final diagnoses:  Renal colic on left side    Rx / DC Orders ED Discharge Orders     None         Tilden Fossa, MD 09/09/21 [phone]

## 2021-09-09 NOTE — ED Triage Notes (Signed)
Patient stated he thinks he is passing a kidney stone. Feeling pain in the left groin and numbness down to his penis area. He said this means he is about to pass another stone. Just passed one the other weekend. Dry heaving in triage.

## 2021-09-09 NOTE — ED Provider Notes (Signed)
Pt signed out by Dr. Madilyn Hook pending symptomatic improvement.  Pt is now feeling well enough to go home.  He knows to return if worse.  He sees Dr. Berneice Heinrich for his multiple stones.  He is to f/u with him.  Return if worse.    Jacalyn Lefevre, MD 09/09/21 1147

## 2021-09-09 NOTE — ED Notes (Signed)
Pt returned from radiology, [place] at bedside.

## 2021-09-16 ENCOUNTER — Other Ambulatory Visit: Payer: Self-pay | Admitting: Urology

## 2021-09-17 NOTE — Progress Notes (Signed)
Left voicemail for Yates Decamp to request pre orders.

## 2021-09-19 NOTE — Patient Instructions (Signed)
SURGICAL WAITING ROOM VISITATION Patients having surgery or a procedure may have no more than 2 support people in the waiting area - these visitors may rotate.   Children under the age of 32 must have an adult with them who is not the patient. If the patient needs to stay at the hospital during part of their recovery, the visitor guidelines for inpatient rooms apply. Pre-op nurse will coordinate an appropriate time for 1 support person to accompany patient in pre-op.  This support person may not rotate.    Please refer to the [facility] website for the visitor guidelines for Inpatients (after your surgery is over and you are in a regular room).       Your procedure is scheduled on: Friday, Sept. 22, 2023   Report to [facility] Main Entrance    Report to admitting at 1:15 PM   Call this number if you have problems the morning of surgery [phone]   Do not eat food :After Midnight.   After Midnight you may have the following liquids until 12:30 PM DAY OF SURGERY  Water Non-Citrus Juices (without pulp, NO RED) Carbonated Beverages Black Coffee (NO MILK/CREAM OR CREAMERS, sugar ok)  Clear Tea (NO MILK/CREAM OR CREAMERS, sugar ok) regular and decaf                             Plain Jell-O (NO RED)                                           Fruit ices (not with fruit pulp, NO RED)                                     Popsicles (NO RED)                                                               Sports drinks like Gatorade (NO RED)   FOLLOW BOWEL PREP AND ANY ADDITIONAL PRE OP INSTRUCTIONS YOU RECEIVED FROM YOUR SURGEON'S OFFICE!!!     Oral Hygiene is also important to reduce your risk of infection.                                    Remember - BRUSH YOUR TEETH THE MORNING OF SURGERY WITH YOUR REGULAR TOOTHPASTE   Do NOT smoke after Midnight   Take these medicines the morning of surgery with A SIP OF WATER: Zofran if needed, Percocet if needed and can tolerate on an  empty stomach                              You may not have any metal on your body including jewelry, and body piercing             Do not wear lotions, powders, perfumes/cologne, or deodorant              Men may  shave face and neck.   Do not bring valuables to the hospital. [facility] IS NOT             RESPONSIBLE   FOR VALUABLES.   Contacts, dentures or bridgework may not be worn into surgery.  DO NOT BRING YOUR HOME MEDICATIONS TO THE HOSPITAL. PHARMACY WILL DISPENSE MEDICATIONS LISTED ON YOUR MEDICATION LIST TO YOU DURING YOUR ADMISSION IN THE HOSPITAL!    Patients discharged on the day of surgery will not be allowed to drive home.  Someone NEEDS to stay with you for the first 24 hours after anesthesia.   Special Instructions: Bring a copy of your healthcare power of attorney and living will documents         the day of surgery if you haven't scanned them before.              Please read over the following fact sheets you were given: IF YOU HAVE QUESTIONS ABOUT YOUR PRE-OP INSTRUCTIONS PLEASE CALL [phone]     [facility] - Preparing for Surgery Before surgery, you can play an important role.  Because skin is not sterile, your skin needs to be as free of germs as possible.  You can reduce the number of germs on your skin by washing with CHG (chlorahexidine gluconate) soap before surgery.  CHG is an antiseptic cleaner which kills germs and bonds with the skin to continue killing germs even after washing. Please DO NOT use if you have an allergy to CHG or antibacterial soaps.  If your skin becomes reddened/irritated stop using the CHG and inform your nurse when you arrive at Short Stay. Do not shave (including legs and underarms) for at least 48 hours prior to the first CHG shower.  You may shave your face/neck.  Please follow these instructions carefully:  1.  Shower with CHG Soap the night before surgery and the  morning of surgery.  2.  If you choose to wash your hair, wash  your hair first as usual with your normal  shampoo.  3.  After you shampoo, rinse your hair and body thoroughly to remove the shampoo.                             4.  Use CHG as you would any other liquid soap.  You can apply chg directly to the skin and wash.  Gently with a scrungie or clean washcloth.  5.  Apply the CHG Soap to your body ONLY FROM THE NECK DOWN.   Do   not use on face/ open                           Wound or open sores. Avoid contact with eyes, ears mouth and   genitals (private parts).                       Wash face,  Genitals (private parts) with your normal soap.             6.  Wash thoroughly, paying special attention to the area where your    surgery  will be performed.  7.  Thoroughly rinse your body with warm water from the neck down.  8.  DO NOT shower/wash with your normal soap after using and rinsing off the CHG Soap.  9.  Pat yourself dry with a clean towel.            10.  Wear clean pajamas.            11.  Place clean sheets on your bed the night of your first shower and do not  sleep with pets. Day of Surgery : Do not apply any lotions/deodorants the morning of surgery.  Please wear clean clothes to the hospital/surgery center.  FAILURE TO FOLLOW THESE INSTRUCTIONS MAY RESULT IN THE CANCELLATION OF YOUR SURGERY  PATIENT SIGNATURE_________________________________  NURSE SIGNATURE__________________________________  ________________________________________________________________________

## 2021-09-23 ENCOUNTER — Inpatient Hospital Stay (HOSPITAL_COMMUNITY)
Admission: RE | Admit: 2021-09-23 | Discharge: 2021-09-23 | Disposition: A | Discharge status: Home / Self Care | Payer: No Typology Code available for payment source | Source: Ambulatory Visit

## 2021-09-23 ENCOUNTER — Other Ambulatory Visit: Payer: Self-pay

## 2021-09-23 ENCOUNTER — Encounter (HOSPITAL_COMMUNITY): Payer: Self-pay

## 2021-09-23 NOTE — Progress Notes (Signed)
Anesthesia Review:  PCP: Cardiologist : Chest x-ray : EKG : Echo : Stress test: Cardiac Cath :  Activity level:  Sleep Study/ CPAP : Fasting Blood Sugar :      / Checks Blood Sugar -- times a day:   Blood Thinner/ Instructions /Last Dose: ASA / Instructions/ Last Dose :   09/09/21- CBC/DIFf, CMP and U/A in epic

## 2021-09-27 ENCOUNTER — Ambulatory Visit (HOSPITAL_COMMUNITY): Payer: PRIVATE HEALTH INSURANCE | Admitting: Physician Assistant

## 2021-09-27 ENCOUNTER — Ambulatory Visit (HOSPITAL_COMMUNITY): Payer: PRIVATE HEALTH INSURANCE

## 2021-09-27 ENCOUNTER — Encounter (HOSPITAL_COMMUNITY): Payer: Self-pay | Admitting: Urology

## 2021-09-27 ENCOUNTER — Ambulatory Visit (HOSPITAL_COMMUNITY)
Admission: RE | Admit: 2021-09-27 | Discharge: 2021-09-27 | Disposition: A | Discharge status: Home / Self Care | Payer: PRIVATE HEALTH INSURANCE | Attending: Urology | Admitting: Urology

## 2021-09-27 ENCOUNTER — Encounter (HOSPITAL_COMMUNITY)
Admission: RE | Disposition: A | Discharge status: Home / Self Care | Payer: Self-pay | Source: Home / Self Care | Attending: Urology

## 2021-09-27 ENCOUNTER — Ambulatory Visit (HOSPITAL_BASED_OUTPATIENT_CLINIC_OR_DEPARTMENT_OTHER): Payer: PRIVATE HEALTH INSURANCE | Admitting: Physician Assistant

## 2021-09-27 DIAGNOSIS — N202 Calculus of kidney with calculus of ureter: Secondary | ICD-10-CM | POA: Insufficient documentation

## 2021-09-27 DIAGNOSIS — Z302 Encounter for sterilization: Secondary | ICD-10-CM | POA: Diagnosis not present

## 2021-09-27 DIAGNOSIS — K219 Gastro-esophageal reflux disease without esophagitis: Secondary | ICD-10-CM | POA: Diagnosis not present

## 2021-09-27 DIAGNOSIS — E7201 Cystinuria: Secondary | ICD-10-CM | POA: Insufficient documentation

## 2021-09-27 HISTORY — PX: CYSTOSCOPY WITH RETROGRADE PYELOGRAM, URETEROSCOPY AND STENT PLACEMENT: SHX5789

## 2021-09-27 HISTORY — PX: HOLMIUM LASER APPLICATION: SHX5852

## 2021-09-27 SURGERY — CYSTOURETEROSCOPY, WITH RETROGRADE PYELOGRAM AND STENT INSERTION
Anesthesia: General | Laterality: Bilateral

## 2021-09-27 MED ORDER — PROPOFOL 10 MG/ML IV BOLUS
INTRAVENOUS | Status: DC | PRN
  Administered 2021-09-27: 200 mg via INTRAVENOUS

## 2021-09-27 MED ORDER — LIDOCAINE 2% (20 MG/ML) 5 ML SYRINGE
INTRAMUSCULAR | Status: DC | PRN
  Administered 2021-09-27: 60 mg via INTRAVENOUS

## 2021-09-27 MED ORDER — FENTANYL CITRATE PF 50 MCG/ML IJ SOSY
25.0000 ug | PREFILLED_SYRINGE | INTRAMUSCULAR | Status: DC | PRN
  Administered 2021-09-27 (×3): 50 ug via INTRAVENOUS

## 2021-09-27 MED ORDER — FENTANYL CITRATE PF 50 MCG/ML IJ SOSY
PREFILLED_SYRINGE | INTRAMUSCULAR | Status: AC
  Filled 2021-09-27: qty 1

## 2021-09-27 MED ORDER — ORAL CARE MOUTH RINSE: 15.0000 mL | Freq: Once | OROMUCOSAL | Status: AC

## 2021-09-27 MED ORDER — CEPHALEXIN 500 MG PO CAPS: 500.0000 mg | ORAL_CAPSULE | Freq: Two times a day (BID) | ORAL | 0 refills | Status: DC

## 2021-09-27 MED ORDER — DEXAMETHASONE SODIUM PHOSPHATE 10 MG/ML IJ SOLN
INTRAMUSCULAR | Status: DC | PRN
  Administered 2021-09-27: 5 mg via INTRAVENOUS

## 2021-09-27 MED ORDER — LIDOCAINE HCL URETHRAL/MUCOSAL 2 % EX GEL
CUTANEOUS | Status: AC
  Filled 2021-09-27: qty 30

## 2021-09-27 MED ORDER — GENTAMICIN SULFATE 40 MG/ML IJ SOLN
5.0000 mg/kg | INTRAVENOUS | Status: AC
  Administered 2021-09-27: 420 mg via INTRAVENOUS
  Filled 2021-09-27: qty 10.5

## 2021-09-27 MED ORDER — DIPHENHYDRAMINE HCL 50 MG/ML IJ SOLN
INTRAMUSCULAR | Status: AC
  Filled 2021-09-27: qty 1

## 2021-09-27 MED ORDER — CHLORHEXIDINE GLUCONATE 0.12 % MT SOLN
15.0000 mL | Freq: Once | OROMUCOSAL | Status: AC
  Administered 2021-09-27: 15 mL via OROMUCOSAL

## 2021-09-27 MED ORDER — MIDAZOLAM HCL 2 MG/2ML IJ SOLN
INTRAMUSCULAR | Status: AC
  Filled 2021-09-27: qty 2

## 2021-09-27 MED ORDER — ONDANSETRON HCL 4 MG/2ML IJ SOLN
INTRAMUSCULAR | Status: DC | PRN
  Administered 2021-09-27: 4 mg via INTRAVENOUS

## 2021-09-27 MED ORDER — SODIUM CHLORIDE 0.9 % IR SOLN
Status: DC | PRN
  Administered 2021-09-27: 3000 mL

## 2021-09-27 MED ORDER — DIPHENHYDRAMINE HCL 50 MG/ML IJ SOLN
6.2500 mg | INTRAMUSCULAR | Status: DC | PRN
  Administered 2021-09-27: 6.5 mg via INTRAVENOUS

## 2021-09-27 MED ORDER — ACETAMINOPHEN 500 MG PO TABS: 1000.0000 mg | ORAL_TABLET | Freq: Once | ORAL | Status: DC | PRN

## 2021-09-27 MED ORDER — PHENYLEPHRINE HCL (PRESSORS) 10 MG/ML IV SOLN
INTRAVENOUS | Status: AC
  Filled 2021-09-27: qty 1

## 2021-09-27 MED ORDER — OXYCODONE-ACETAMINOPHEN 5-325 MG PO TABS: 1.0000 | ORAL_TABLET | Freq: Four times a day (QID) | ORAL | 0 refills | Status: DC | PRN

## 2021-09-27 MED ORDER — LACTATED RINGERS IV SOLN: INTRAVENOUS | Status: DC

## 2021-09-27 MED ORDER — IOHEXOL 300 MG/ML  SOLN
INTRAMUSCULAR | Status: DC | PRN
  Administered 2021-09-27: 10 mL

## 2021-09-27 MED ORDER — PROPOFOL 10 MG/ML IV BOLUS
INTRAVENOUS | Status: AC
  Filled 2021-09-27: qty 20

## 2021-09-27 MED ORDER — FENTANYL CITRATE (PF) 250 MCG/5ML IJ SOLN
INTRAMUSCULAR | Status: AC
  Filled 2021-09-27: qty 5

## 2021-09-27 MED ORDER — ACETAMINOPHEN 10 MG/ML IV SOLN
1000.0000 mg | Freq: Once | INTRAVENOUS | Status: DC | PRN
  Administered 2021-09-27: 1000 mg via INTRAVENOUS

## 2021-09-27 MED ORDER — FENTANYL CITRATE (PF) 250 MCG/5ML IJ SOLN
INTRAMUSCULAR | Status: DC | PRN
  Administered 2021-09-27 (×4): 50 ug via INTRAVENOUS

## 2021-09-27 MED ORDER — ACETAMINOPHEN 160 MG/5ML PO SOLN: 1000.0000 mg | Freq: Once | ORAL | Status: DC | PRN

## 2021-09-27 MED ORDER — ACETAMINOPHEN 10 MG/ML IV SOLN
INTRAVENOUS | Status: AC
  Filled 2021-09-27: qty 100

## 2021-09-27 MED ORDER — MIDAZOLAM HCL 2 MG/2ML IJ SOLN
INTRAMUSCULAR | Status: DC | PRN
  Administered 2021-09-27 (×2): 1 mg via INTRAVENOUS

## 2021-09-27 SURGICAL SUPPLY — 28 items
BAG URO CATCHER STRL LF (MISCELLANEOUS) ×1 IMPLANT
BASKET LASER NITINOL 1.9FR (BASKET) IMPLANT
BSKT STON RTRVL 120 1.9FR (BASKET)
CATH URETL OPEN END 6FR 70 (CATHETERS) ×1 IMPLANT
CLOTH BEACON ORANGE TIMEOUT ST (SAFETY) ×1 IMPLANT
EXTRACTOR STONE 1.7FRX115CM (UROLOGICAL SUPPLIES) IMPLANT
GLOVE SURG LX STRL 7.5 STRW (GLOVE) ×1 IMPLANT
GOWN SRG XL LVL 4 BRTHBL STRL (GOWNS) ×1 IMPLANT
GOWN STRL NON-REIN XL LVL4 (GOWNS) ×1
GOWN STRL REUS W/ TWL XL LVL3 (GOWN DISPOSABLE) ×1 IMPLANT
GOWN STRL REUS W/TWL XL LVL3 (GOWN DISPOSABLE) ×1
GUIDEWIRE ANG ZIPWIRE 038X150 (WIRE) ×1 IMPLANT
GUIDEWIRE STR DUAL SENSOR (WIRE) ×1 IMPLANT
KIT TURNOVER KIT A (KITS) IMPLANT
LASER FIB FLEXIVA PULSE ID 365 (Laser) IMPLANT
LASER FIB FLEXIVA PULSE ID 550 (Laser) IMPLANT
LASER FIB FLEXIVA PULSE ID 910 (Laser) IMPLANT
MANIFOLD NEPTUNE II (INSTRUMENTS) ×1 IMPLANT
PACK CYSTO (CUSTOM PROCEDURE TRAY) ×1 IMPLANT
SHEATH NAVIGATOR HD 11/13X28 (SHEATH) IMPLANT
SHEATH NAVIGATOR HD 11/13X36 (SHEATH) IMPLANT
STENT POLARIS 5FRX24 (STENTS) IMPLANT
SUT CHROMIC 3 0 PS 2 (SUTURE) IMPLANT
TRACTIP FLEXIVA PULS ID 200XHI (Laser) IMPLANT
TRACTIP FLEXIVA PULSE ID 200 (Laser) ×1
TUBE FEEDING 8FR 16IN STR KANG (MISCELLANEOUS) ×1 IMPLANT
TUBING CONNECTING 10 (TUBING) ×1 IMPLANT
TUBING UROLOGY SET (TUBING) ×1 IMPLANT

## 2021-09-27 NOTE — Op Note (Signed)
NAME: Devin Sanders, Devin Sanders MEDICAL RECORD NO: [id number] ACCOUNT NO: [id number] DATE OF BIRTH: Jul 17, 1980 FACILITY: [facility] LOCATION: [facility] PHYSICIAN: Devin Frock, MD  Operative Report   DATE OF PROCEDURE: 09/27/2021  PREOPERATIVE DIAGNOSES: 1.  Cystinuria with recurrent kidney stones. 2.  Desire for permanent sterilization.  PROCEDURE PERFORMED:  1.  Cystoscopy with bilateral retrograde pyelograms and interpretation. 2.  Bilateral ureteroscopy with laser lithotripsy. 3.  Insertion of bilateral ureteral stents. 4.  Bilateral vasectomy.  ESTIMATED BLOOD LOSS:  Nil.  COMPLICATIONS:  None.  SPECIMEN:  Stone fragments and vas segments for discard.  FINDINGS:  1.  Papillary tip calcifications bilaterally, left greater than right. 2.  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  This was opened up via laser. 3.  Successful placement of bilateral ureteral stents. 4.  Bilateral successful vasectomy with removal of approximately 1.5 cm vas segment on each side.  INDICATIONS FOR PROCEDURE:  Devin Sanders is a very pleasant 41 year old man with history of cystinuria.  He has had innumerable stone recurrent episodes, managed predominantly with ureteroscopy.  He is also very compliant with medical and dietary therapy.  He  was found on workup of colicky left flank pain to have some mild left hydronephrosis by ultrasound and several calcifications noted within his left kidney.  It has been approximately 7 months since his most recent stone recurrence, which for patient is  fairly long interval.  Options were discussed including medical therapy versus ureteroscopy as SWL is not treatment of choice with cystinuria.  He elected to proceed with bilateral ureteroscopy with goal of stone free.  We have also discussed on  numerous occasions a vasectomy.  He has several children that were healthy and has expressed desire for permanent sterilization for years and he is resolute in his decision.  He wishes to  have the vasectomy done today as well.  Informed consent was  obtained and placed in medical record.  PROCEDURE DETAILS:  The patient being Devin Sanders verified, procedure being bilateral ureteroscopic stone manipulation and vasectomy was confirmed.  Procedure timeout was performed.  Intravenous antibiotics were administered and general LMA  anesthesia was induced.  The patient was placed into a low lithotomy position.  Sterile field was created, prepped and draped the patient's penis, perineum, and proximal thighs using iodine.  Cystourethroscopy was performed using 21-French rigid  cystoscope with offset lens.  Inspection of anterior and posterior was unremarkable.  Inspection of urinary bladder revealed no diverticula, calcifications, papillary lesions.  Ureteral orifices were single. Left ureteral orifice was cannulated with  6-French end-hole catheter and a left retrograde pyelogram was obtained.  Left retrograde pyelogram demonstrated a single left ureter, single system left kidney.  There was a very mild caliectasis.  There were no obvious filling defects noted in the ureter.  A ZIPwire was advanced slowly, rolled aside as a safety wire.  Next,  right retrograde pyelogram was obtained.  Right ureteropyelogram demonstrated a single right ureter, single system right kidney.  No filling defects or narrowing noted.  A separate ZIPwire was advanced to the level of the kidney and set aside as a safety wire.  An 8-French feeding tube placed in  the urinary bladder for pressure release.  Next, a semirigid ureteroscopy performed of the distal orifice of the right ureter alongside a separate sensor working wire.  No mucosal abnormalities were found.  Next, semirigid ureteroscopy performed of the  distal orifice of the left ureter alongside a separate sensor working wire.  No mucosal abnormalities were  found.  The semirigid scope was exchanged for the 11/14 medium length ureteral access sheath to the  level of proximal ureter. Using continuous  fluoroscopic guidance, a flexible digital ureteroscopy was performed of the proximal left ureter and systematic inspection of the left kidney.  There is multifocal papillary tip calcifications, mostly in the upper pole.  There was no obvious large  intraluminal free floating stone.  There was an area of infundibular stenosis in the upper mid calix that he has had previously and this did have significant overgrowth now just being a punctate pinhole communicating to the renal pelvis.  As such,  holmium laser energy was applied to the papillary calcifications using settings of 1 joule and 10 Hz.  These were completely ablated and similarly the area of infundibular stenosis was opened up again in the upper mid calix, which then allowed navigation  of the ureteroscope onto this infundibulum and calix and it was stone free.  We achieved the goals on the left side. Sheath was removed under continuous vision, no significant mucosal measure found.  Access sheath was placed over the right sensor  working wire to the level of proximal ureter using continuous fluoroscopic guidance and flexible digital ureteroscopy was performed of the proximal ureter and systematic inspection of the right kidney and all calices x 3.  Similarly, there were  multifocal papillary tip calcifications, mostly in the upper pole.  There was no intraluminal free floating stone whatsoever.  There was no evidence of infundibular stenosis on the right side, similarly using holmium laser energy, the papillary tip  calcifications were ablated completely and all accessible stone fragments larger than one-third mm were carefully removed.  Access sheath was removed under continuous vision, no significant vaginal mucosal abnormalities were found.  Given the bilateral  nature of the procedure today, it was felt that brief interval stenting with tethered stents would be most prudent.  As such, bilateral  5 x 24  Polaris type stents were placed over remaining safety wire using fluoroscopic guidance.  Good proximal and distal  planes were noted.  Tethers were left in place, fashioned together and taped to the dorsum of penis.  The penis was then retracted out of the way to allow better access to the scrotal area, which was then reprepped using iodine.  Attention was then  directed at vasectomy.  The right vas was located and manually manipulated to the far right lateral scrotal skin where sharp needlepoint forceps were used to pierce the skin.  The vas was grasped with ring graspers and externalized. The vas were  dissected and a segment approximately 1.5 cm was taken out of continuity.  This was inspected and had an obvious lumen.  The ends of this were coagulated using point coagulation current and each end was separately tied with a chromic stitch.  The vas  ends were redelivered into the scrotum and a U-stitch was applied to the skin using interrupted chromic.  A mirror image vasectomy procedure was performed on the contralateral side in an uncomplicated fashion.  This achieved the goals of surgery today.  The patient was taken to postanesthesia care in stable condition.  Plan for discharge home.   MUK D: 09/27/2021 3:43:01 pm T: 09/27/2021 9:25:00 pm  JOB: [id number]/ [id number]

## 2021-09-27 NOTE — Anesthesia Procedure Notes (Signed)
Date/Time: 09/27/2021 3:28 PM  Performed by: Cynda Familia, CRNAOxygen Delivery Method: Simple face mask Placement Confirmation: positive ETCO2 and breath sounds checked- equal and bilateral Dental Injury: Teeth and Oropharynx as per pre-operative assessment

## 2021-09-27 NOTE — Anesthesia Procedure Notes (Signed)
Procedure Name: LMA Insertion Date/Time: 09/27/2021 2:29 PM  Performed by: Cynda Familia, CRNAPre-anesthesia Checklist: Patient identified, Emergency Drugs available, Suction available and Patient being monitored Patient Re-evaluated:Patient Re-evaluated prior to induction Oxygen Delivery Method: Circle System Utilized Preoxygenation: Pre-oxygenation with 100% oxygen Induction Type: IV induction Ventilation: Mask ventilation without difficulty LMA: LMA inserted and LMA with gastric port inserted LMA Size: 4.0 Number of attempts: 1 Airway Equipment and Method: Bite block Placement Confirmation: positive ETCO2 Tube secured with: Tape Dental Injury: Teeth and Oropharynx as per pre-operative assessment  Comments: Smooth IV induction Moser-- intubation AM CRNA atraumatic-- teeth and mouth as preop-- chipping present on front teeth- unchanged with LMA insertion. Bilat BS

## 2021-09-27 NOTE — Brief Op Note (Signed)
09/27/2021  3:34 PM  PATIENT:  Lanette Hampshire  41 y.o. male  PRE-OPERATIVE DIAGNOSIS:  RENAL AND URETERAL STONES  POST-OPERATIVE DIAGNOSIS:  RENAL AND URETERAL STONES  PROCEDURE:  Procedure(s) with comments: CYSTOSCOPY WITH RETROGRADE PYELOGRAM, URETEROSCOPY AND STENT PLACEMENT, VASECTOMY (Bilateral) - 75 MINS HOLMIUM LASER APPLICATION (Bilateral)  SURGEON:  Surgeon(s) and Role:    Alexis Frock, MD - Primary  PHYSICIAN ASSISTANT:   ASSISTANTS: none   ANESTHESIA:   general  EBL:  5 mL   BLOOD ADMINISTERED:none  DRAINS: none   LOCAL MEDICATIONS USED:  NONE  SPECIMEN:  Source of Specimen:  stone fragments, vas segments  DISPOSITION OF SPECIMEN:   discard  COUNTS:  YES  TOURNIQUET:  * No tourniquets in log *  DICTATION: .Other Dictation: Dictation Number [id number]  PLAN OF CARE: Discharge to home after PACU  PATIENT DISPOSITION:  PACU - hemodynamically stable.   Delay start of Pharmacological VTE agent (>24hrs) due to surgical blood loss or risk of bleeding: yes

## 2021-09-27 NOTE — Transfer of Care (Signed)
Immediate Anesthesia Transfer of Care Note  Patient: Devin Sanders  Procedure(s) Performed: CYSTOSCOPY WITH RETROGRADE PYELOGRAM, URETEROSCOPY AND STENT PLACEMENT, VASECTOMY (Bilateral) HOLMIUM LASER APPLICATION (Bilateral)  Patient Location: PACU  Anesthesia Type:General  Level of Consciousness: awake and alert   Airway & Oxygen Therapy: Patient Spontanous Breathing and Patient connected to face mask oxygen  Post-op Assessment: Report given to RN and Post -op Vital signs reviewed and stable  Post vital signs: Reviewed and stable  Last Vitals:  Vitals Value Taken Time  BP 122/78 09/27/21 1535  Temp    Pulse    Resp 9 09/27/21 1536  SpO2    Vitals shown include unvalidated device data.  Last Pain:  Vitals:   09/27/21 1344  TempSrc:   PainSc: 8       Patients Stated Pain Goal: 3 (38/10/17 5102)  Complications: No notable events documented.

## 2021-09-27 NOTE — Discharge Instructions (Addendum)
1 - You may have urinary urgency (bladder spasms) and bloody urine on / off with stent in place. This is normal.  2 - No unprotected intercourse until verified negative semen sample. Vasectomy is NOT instantly effective.   3 - Remove tethered stents on Monday morning at home by pulling on string, then blue-white plastic tubing and discarding. Office is open Monday if any problems arise.   4 - Call MD or go to ER for fever >102, severe pain / nausea / vomiting not relieved by medications, or acute change in medical status

## 2021-09-27 NOTE — Anesthesia Preprocedure Evaluation (Signed)
Anesthesia Evaluation  Patient identified by MRN, date of birth, ID band Patient awake    Reviewed: Allergy & Precautions, NPO status , Patient's Chart, lab work & pertinent test results  History of Anesthesia Complications Negative for: history of anesthetic complications  Airway Mallampati: III  TM Distance: >3 FB Neck ROM: Full    Dental  (+) Teeth Intact, Dental Advisory Given   Pulmonary neg pulmonary ROS,    breath sounds clear to auscultation       Cardiovascular negative cardio ROS   Rhythm:Regular     Neuro/Psych negative neurological ROS  negative psych ROS   GI/Hepatic Neg liver ROS, GERD  ,  Endo/Other    Renal/GU Renal diseaseRENAL AND URETERAL STONES     Musculoskeletal negative musculoskeletal ROS (+)   Abdominal   Peds  Hematology negative hematology ROS (+)   Anesthesia Other Findings   Reproductive/Obstetrics                             Anesthesia Physical Anesthesia Plan  ASA: 1  Anesthesia Plan: General   Post-op Pain Management: Minimal or no pain anticipated   Induction: Intravenous  PONV Risk Score and Plan: 2 and Dexamethasone and Ondansetron  Airway Management Planned: LMA  Additional Equipment: None  Intra-op Plan:   Post-operative Plan: Extubation in OR  Informed Consent: I have reviewed the patients History and Physical, chart, labs and discussed the procedure including the risks, benefits and alternatives for the proposed anesthesia with the patient or authorized representative who has indicated his/her understanding and acceptance.     Dental advisory given  Plan Discussed with: CRNA  Anesthesia Plan Comments:         Anesthesia Quick Evaluation

## 2021-09-28 ENCOUNTER — Encounter (HOSPITAL_COMMUNITY): Payer: Self-pay | Admitting: Urology

## 2021-09-29 ENCOUNTER — Encounter (HOSPITAL_COMMUNITY): Payer: Self-pay

## 2021-09-29 ENCOUNTER — Emergency Department (HOSPITAL_COMMUNITY): Payer: PRIVATE HEALTH INSURANCE

## 2021-09-29 ENCOUNTER — Inpatient Hospital Stay (HOSPITAL_COMMUNITY)
Admission: EM | Admit: 2021-09-29 | Discharge: 2021-10-03 | DRG: 694 | Disposition: A | Discharge status: Home / Self Care | Payer: PRIVATE HEALTH INSURANCE | Attending: Urology | Admitting: Urology

## 2021-09-29 DIAGNOSIS — Z885 Allergy status to narcotic agent status: Secondary | ICD-10-CM

## 2021-09-29 DIAGNOSIS — N23 Unspecified renal colic: Principal | ICD-10-CM | POA: Diagnosis present

## 2021-09-29 DIAGNOSIS — E669 Obesity, unspecified: Secondary | ICD-10-CM | POA: Diagnosis present

## 2021-09-29 DIAGNOSIS — Z79899 Other long term (current) drug therapy: Secondary | ICD-10-CM

## 2021-09-29 DIAGNOSIS — Z91018 Allergy to other foods: Secondary | ICD-10-CM

## 2021-09-29 DIAGNOSIS — Z6831 Body mass index (BMI) 31.0-31.9, adult: Secondary | ICD-10-CM

## 2021-09-29 DIAGNOSIS — Z8616 Personal history of COVID-19: Secondary | ICD-10-CM

## 2021-09-29 DIAGNOSIS — R109 Unspecified abdominal pain: Secondary | ICD-10-CM | POA: Diagnosis not present

## 2021-09-29 DIAGNOSIS — Z87442 Personal history of urinary calculi: Secondary | ICD-10-CM

## 2021-09-29 DIAGNOSIS — G8918 Other acute postprocedural pain: Secondary | ICD-10-CM | POA: Diagnosis present

## 2021-09-29 DIAGNOSIS — K219 Gastro-esophageal reflux disease without esophagitis: Secondary | ICD-10-CM | POA: Diagnosis present

## 2021-09-29 DIAGNOSIS — E7201 Cystinuria: Secondary | ICD-10-CM | POA: Diagnosis present

## 2021-09-29 DIAGNOSIS — Z466 Encounter for fitting and adjustment of urinary device: Secondary | ICD-10-CM

## 2021-09-29 LAB — CBC WITH DIFFERENTIAL/PLATELET
Abs Immature Granulocytes: 0.01 10*3/uL (ref 0.00–0.07)
Basophils Absolute: 0 10*3/uL (ref 0.0–0.1)
Basophils Relative: 0 %
Eosinophils Absolute: 0.1 10*3/uL (ref 0.0–0.5)
Eosinophils Relative: 1 %
HCT: 41.1 % (ref 39.0–52.0)
Hemoglobin: 14 g/dL (ref 13.0–17.0)
Immature Granulocytes: 0 %
Lymphocytes Relative: 28 %
Lymphs Abs: 2 10*3/uL (ref 0.7–4.0)
MCH: 33.1 pg (ref 26.0–34.0)
MCHC: 34.1 g/dL (ref 30.0–36.0)
MCV: 97.2 fL (ref 80.0–100.0)
Monocytes Absolute: 0.5 10*3/uL (ref 0.1–1.0)
Monocytes Relative: 7 %
Neutro Abs: 4.6 10*3/uL (ref 1.7–7.7)
Neutrophils Relative %: 64 %
Platelets: 252 10*3/uL (ref 150–400)
RBC: 4.23 MIL/uL (ref 4.22–5.81)
RDW: 11.8 % (ref 11.5–15.5)
WBC: 7.2 10*3/uL (ref 4.0–10.5)
nRBC: 0 % (ref 0.0–0.2)

## 2021-09-29 LAB — URINALYSIS, ROUTINE W REFLEX MICROSCOPIC
Bacteria, UA: NONE SEEN
Bilirubin Urine: NEGATIVE
Glucose, UA: NEGATIVE mg/dL
Ketones, ur: NEGATIVE mg/dL
Nitrite: NEGATIVE
Protein, ur: 100 mg/dL — AB
RBC / HPF: >50 RBC/hpf — ABNORMAL HIGH (ref 0–5)
Specific Gravity, Urine: 1.035 — ABNORMAL HIGH (ref 1.005–1.030)
pH: 6 (ref 5.0–8.0)

## 2021-09-29 LAB — BASIC METABOLIC PANEL
Anion gap: 6 (ref 5–15)
BUN: 26 mg/dL — ABNORMAL HIGH (ref 6–20)
CO2: 24 mmol/L (ref 22–32)
Calcium: 9 mg/dL (ref 8.9–10.3)
Chloride: 107 mmol/L (ref 98–111)
Creatinine, Ser: 1.07 mg/dL (ref 0.61–1.24)
GFR, Estimated: >60 mL/min (ref >60)
Glucose, Bld: 94 mg/dL (ref 70–99)
Potassium: 3.8 mmol/L (ref 3.5–5.1)
Sodium: 137 mmol/L (ref 135–145)

## 2021-09-29 MED ORDER — KETOROLAC TROMETHAMINE 15 MG/ML IJ SOLN
15.0000 mg | Freq: Once | INTRAMUSCULAR | Status: AC
  Administered 2021-09-29: 15 mg via INTRAVENOUS
  Filled 2021-09-29: qty 1

## 2021-09-29 MED ORDER — HYDROMORPHONE HCL 1 MG/ML IJ SOLN
0.5000 mg | Freq: Once | INTRAMUSCULAR | Status: AC
  Administered 2021-09-29: 0.5 mg via INTRAVENOUS
  Filled 2021-09-29: qty 1

## 2021-09-29 MED ORDER — DIPHENHYDRAMINE HCL 50 MG/ML IJ SOLN
25.0000 mg | Freq: Once | INTRAMUSCULAR | Status: AC
  Administered 2021-09-29: 25 mg via INTRAVENOUS
  Filled 2021-09-29: qty 1

## 2021-09-29 MED ORDER — ONDANSETRON HCL 4 MG/2ML IJ SOLN
4.0000 mg | Freq: Once | INTRAMUSCULAR | Status: AC
  Administered 2021-09-29: 4 mg via INTRAVENOUS
  Filled 2021-09-29: qty 2

## 2021-09-29 NOTE — ED Triage Notes (Signed)
Pt arrived via POV, c/o pain after urethral stent placement.

## 2021-09-29 NOTE — ED Provider Triage Note (Signed)
Emergency Medicine Provider Triage Evaluation Note  JOSEL KEO , a 41 y.o. male  was evaluated in triage.  Pt complains of left-sided abdominal pain.  Patient has a history of recurrent kidney stones.  Has stents in place that he manually removes after certain amount of time.  [facility].  Woke up this morning with left-sided abdominal pain and flank pain along with nausea and vomiting.  Unable to tolerate anything p.o. since this morning.  Reports pain feels similar to his kidney stones in the past.  Review of Systems  Positive: As above Negative: Diarrhea, fevers, chills  Physical Exam  BP (!) 136/96   Pulse 75   Temp 98.4 F (36.9 C) (Oral)   Resp 18   SpO2 93%  Gen:   Awake, no distress   Resp:  Normal effort  MSK:   Moves extremities without difficulty  Other:  Left-sided flank pain  Medical Decision Making  Medically screening exam initiated at 5:08 PM.  Appropriate orders placed.  Lanette Hampshire was informed that the remainder of the evaluation will be completed by another provider, this initial triage assessment does not replace that evaluation, and the importance of remaining in the ED until their evaluation is complete.  We will obtain basic labs, urinalysis and renal stone study   Roylene Reason, Hershal Coria 09/29/21 1710

## 2021-09-29 NOTE — ED Provider Notes (Signed)
[facility]  HOSPITAL-EMERGENCY DEPT Provider Note   CSN: [id number] Arrival date & time: 09/29/21  1604     History  Chief Complaint  Patient presents with   Flank Pain    Devin Sanders is a 41 y.o. male.  Patient is a 41 year old male with a past medical history of recurrent ureterolithiasis presenting to the emergency department with postop pain.  The patient had bilateral ureteral stents placed on Friday with lithotripsy.  He states that he was initially doing well but started to develop worsening nausea and vomiting and was unable to tolerate his pain medications today which prompted him to come to the emergency department.  He denies any fevers or chills.  He denies any increasing hematuria.  He states that he is mostly having pain around his left flank and left lower quadrant.  He states he is supposed to have his stents removed tomorrow morning at home.  The history is provided by the patient.  Flank Pain       Home Medications Prior to Admission medications   Medication Sig Start Date End Date Taking? Authorizing Provider  cephALEXin (KEFLEX) 500 MG capsule Take 1 capsule (500 mg total) by mouth 2 (two) times daily for 3 days. To prevent infection with tethered stent 09/27/21 09/30/21  [name], MD  ondansetron (ZOFRAN-ODT) 4 MG disintegrating tablet Take 1 tablet (4 mg total) by mouth every 8 (eight) hours as needed for nausea or vomiting. 09/09/21   Jacalyn Lefevre, MD  oxyCODONE-acetaminophen (PERCOCET/ROXICET) 5-325 MG tablet Take 1 tablet by mouth every 6 (six) hours as needed for severe pain (post-operativley). 09/27/21   [name], MD  THIOLA EC 300 MG TBEC Take 300 mg by mouth in the morning, at noon, and at bedtime. 08/30/21   [provider]      Allergies    Morphine and Other    Review of Systems   Review of Systems  Genitourinary:  Positive for flank pain.    Physical Exam Updated Vital Signs BP (!) 138/92   Pulse 66    Temp 98.3 F (36.8 C) (Oral)   Resp 18   SpO2 96%  Physical Exam Vitals and nursing note reviewed.  Constitutional:      General: He is not in acute distress.    Appearance: Normal appearance.  HENT:     Head: Normocephalic and atraumatic.     Nose: Nose normal.     Mouth/Throat:     Mouth: Mucous membranes are moist.     Pharynx: Oropharynx is clear.  Eyes:     Extraocular Movements: Extraocular movements intact.     Conjunctiva/sclera: Conjunctivae normal.  Cardiovascular:     Rate and Rhythm: Normal rate and regular rhythm.     Pulses: Normal pulses.     Heart sounds: Normal heart sounds.  Pulmonary:     Effort: Pulmonary effort is normal.     Breath sounds: Normal breath sounds.  Abdominal:     General: Abdomen is flat.     Palpations: Abdomen is soft.     Tenderness: There is abdominal tenderness (Left lower quadrant without rebound or guarding). There is no right CVA tenderness or left CVA tenderness.  Musculoskeletal:        General: Normal range of motion.     Cervical back: Normal range of motion and neck supple.  Skin:    General: Skin is warm and dry.  Neurological:     General: No focal deficit present.  Mental Status: He is alert and oriented to person, place, and time.  Psychiatric:        Mood and Affect: Mood normal.        Behavior: Behavior normal.     ED Results / Procedures / Treatments   Labs (all labs ordered are listed, but only abnormal results are displayed) Labs Reviewed  BASIC METABOLIC PANEL - Abnormal; Notable for the following components:      Result Value   BUN 26 (*)    All other components within normal limits  URINALYSIS, ROUTINE W REFLEX MICROSCOPIC - Abnormal; Notable for the following components:   Color, Urine RED (*)    APPearance CLOUDY (*)    Specific Gravity, Urine 1.035 (*)    Hgb urine dipstick LARGE (*)    Protein, ur 100 (*)    Leukocytes,Ua SMALL (*)    RBC / HPF >50 (*)    All other components within normal  limits  CBC WITH DIFFERENTIAL/PLATELET    EKG None  Radiology CT Renal Stone Study  Result Date: 09/29/2021 CLINICAL DATA:  Renal stones, flank pain EXAM: CT ABDOMEN AND PELVIS WITHOUT CONTRAST TECHNIQUE: Multidetector CT imaging of the abdomen and pelvis was performed following the standard protocol without IV contrast. RADIATION DOSE REDUCTION: This exam was performed according to the departmental dose-optimization program which includes automated exposure control, adjustment of the mA and/or kV according to patient size and/or use of iterative reconstruction technique. COMPARISON:  Renal sonogram done on 09/09/2021, CT done on 02/14/2021 FINDINGS: Lower chest: There are new small patchy ground-glass densities in both lower lung fields. Hepatobiliary: No focal abnormalities are seen in liver. There is no dilation of bile ducts. Gallbladder is unremarkable. Pancreas: No focal abnormalities are seen. Spleen: Unremarkable. Adrenals/Urinary Tract: Adrenals are unremarkable. There is no hydronephrosis. There are multiple small bilateral renal stones each measuring less than 6 mm. There is 1.2 cm high density structure in the lower pole of left kidney which was not evident in the previous CT. This may suggest hemorrhagic cyst or calcification related to previous intervention. Bilateral ureteral stents are seen. Ureteral stents appear to be projecting into the prostatic urethra, possibly suggesting previous intervention in prostate. There are ill-defined fluid density lesions in the upper pole of left kidney and midportion of right kidney, possibly renal cysts. Ureters are not dilated. Urinary bladder is not distended. Stomach/Bowel: Small hiatal hernia is seen. Stomach is not distended. Small bowel loops are not dilated. Appendix is not dilated. There is no significant wall thickening in colon. There is no pericolic stranding. Vascular/Lymphatic: Unremarkable. Reproductive: Unremarkable. Other: There is no  ascites or pneumoperitoneum. Small umbilical hernia containing fat is seen. Bilateral inguinal hernias containing fat are noted. Musculoskeletal: No acute findings are seen. IMPRESSION: There are multiple new small foci of ground-glass densities in both lower lung fields suggesting possible multifocal pneumonia. There is no pleural effusion. There is no evidence of intestinal obstruction or pneumoperitoneum. There is no hydronephrosis. Bilateral ureteral stents are seen. Bilateral renal stones are seen, more so in the left kidney. There is 1.2 cm high density structure in the lower pole of left kidney, possibly calcification related to previous intervention. There are possible small cysts in both kidneys. Other findings as described in the body of the report. Electronically Signed   By: Ernie Avena M.D.   On: 09/29/2021 18:24    Procedures Procedures    Medications Ordered in ED Medications  ketorolac (TORADOL) 15 MG/ML injection 15 mg (  15 mg Intravenous Given 09/29/21 2142)  ondansetron (ZOFRAN) injection 4 mg (4 mg Intravenous Given 09/29/21 2138)  HYDROmorphone (DILAUDID) injection 0.5 mg (0.5 mg Intravenous Given 09/29/21 2144)  diphenhydrAMINE (BENADRYL) injection 25 mg (25 mg Intravenous Given 09/29/21 2140)    ED Course/ Medical Decision Making/ A&P Clinical Course as of 09/29/21 2313  Sun Sep 29, 2021  2126 I spoke with Dr. Jeffie Pollock of urology and he states that if the patient's stents are due to be removed tomorrow that he is okay with him removing them in the ER tonight.  He should be monitored for 3 to 4 hours post removal as he can have post removal edema and renal colic. Patient is agreeable with plan. [VK]  2238 Patient removed ureteral stents at bedside himself without complication. He had no further vomiting in the ER. He will be observed for 3-4 hours for pain and nausea control. [VK]  2312 Patient signed out to Dr. Ralene Bathe pending observation and reassessment. [VK]    Clinical  Course User Index [VK] Ottie Glazier, DO                           Medical Decision Making This patient presents to the ED with chief complaint(s) of postop flank pain with pertinent past medical history of recurrent ureteralithiasis which further complicates the presenting complaint. The complaint involves an extensive differential diagnosis and also carries with it a high risk of complications and morbidity.    The differential diagnosis includes UTI, pyelonephritis, stent dysfunction, nephrolithiasis  Additional history obtained: Additional history obtained from N/A Records reviewed urology records  ED Course and Reassessment: Patient was initially evaluated by provider in triage and had labs, urine and CT stone search performed.  Urine shows no signs of urinary tract infection.  CT showed stents in place.  Labs are within normal range without signs of severe infection.  Patient will be given pain control with Dilaudid and Toradol and was given Zofran for nausea.  Urology will be consulted for possible stent removal tonight  Independent labs interpretation:  The following labs were independently interpreted: Hematuria but no signs of infection, labs otherwise within normal range  Independent visualization of imaging: - I independently visualized the following imaging with scope of interpretation limited to determining acute life threatening conditions related to emergency care: CT stone search, which revealed ureteral stents in place  Consultation: - Consulted or discussed management/test interpretation w/ external professional: Urology     Risk Prescription drug management.           Final Clinical Impression(s) / ED Diagnoses Final diagnoses:  Post-operative pain  Encounter for removal of ureteral stent  Ureteral colic    Rx / DC Orders ED Discharge Orders     None         Ottie Glazier, DO 09/29/21 2313

## 2021-09-29 NOTE — Discharge Instructions (Signed)
You were seen in the emergency department for your postoperative pain.  You no signs of any infection.  We spoke with urology and they were okay with you removing your stents tonight which we did.  We were able to control your pain and nausea.  Your work-up showed no signs of severe dehydration.  You should continue to take your pain medication as prescribed by your urologist and follow-up with them as planned.  You should return to the emergency department if you have significantly worsening pain, you have fevers, you have repetitive vomiting, or if you have any other new or concerning symptoms.

## 2021-09-29 NOTE — ED Provider Notes (Signed)
Patient care assumed at 2300.  Patient here for evaluation of recurrent flank pain, had recent lithotripsy.  Care assumed pending removal of ureteral stent and assessment for recurrent pain.  Patient required multiple doses of pain medications for pain control.  Discussed with Dr. Jeffie Pollock with urology.  Plan to admit for ongoing pain control.   Quintella Reichert, MD 09/30/21 [phone]

## 2021-09-30 ENCOUNTER — Other Ambulatory Visit: Payer: Self-pay

## 2021-09-30 DIAGNOSIS — N23 Unspecified renal colic: Secondary | ICD-10-CM | POA: Diagnosis present

## 2021-09-30 MED ORDER — HYDROMORPHONE HCL 1 MG/ML IJ SOLN
0.5000 mg | Freq: Once | INTRAMUSCULAR | Status: AC
  Administered 2021-09-30: 0.5 mg via INTRAVENOUS
  Filled 2021-09-30: qty 1

## 2021-09-30 MED ORDER — ORAL CARE MOUTH RINSE: 15.0000 mL | OROMUCOSAL | Status: DC | PRN

## 2021-09-30 MED ORDER — HYDROMORPHONE HCL 1 MG/ML IJ SOLN
0.5000 mg | INTRAMUSCULAR | Status: AC | PRN
  Administered 2021-09-30 (×2): 0.5 mg via INTRAVENOUS
  Filled 2021-09-30 (×2): qty 1

## 2021-09-30 MED ORDER — KETOROLAC TROMETHAMINE 30 MG/ML IJ SOLN
30.0000 mg | Freq: Three times a day (TID) | INTRAMUSCULAR | Status: DC
  Administered 2021-09-30 – 2021-10-03 (×10): 30 mg via INTRAVENOUS
  Filled 2021-09-30 (×10): qty 1

## 2021-09-30 MED ORDER — ONDANSETRON HCL 4 MG/2ML IJ SOLN: 4.0000 mg | Freq: Three times a day (TID) | INTRAMUSCULAR | Status: AC | PRN

## 2021-09-30 MED ORDER — DIPHENHYDRAMINE HCL 50 MG/ML IJ SOLN
12.5000 mg | Freq: Once | INTRAMUSCULAR | Status: AC
  Administered 2021-09-30: 12.5 mg via INTRAVENOUS
  Filled 2021-09-30: qty 1

## 2021-09-30 MED ORDER — DIPHENHYDRAMINE HCL 50 MG/ML IJ SOLN: 12.5000 mg | Freq: Four times a day (QID) | INTRAMUSCULAR | Status: DC | PRN

## 2021-09-30 MED ORDER — OXYCODONE-ACETAMINOPHEN 5-325 MG PO TABS
1.0000 | ORAL_TABLET | ORAL | Status: DC | PRN
  Administered 2021-09-30 – 2021-10-01 (×7): 1 via ORAL
  Filled 2021-09-30 (×7): qty 1

## 2021-09-30 MED ORDER — DIPHENHYDRAMINE HCL 25 MG PO CAPS
25.0000 mg | ORAL_CAPSULE | Freq: Four times a day (QID) | ORAL | Status: DC | PRN
  Administered 2021-09-30: 25 mg via ORAL
  Filled 2021-09-30: qty 1

## 2021-09-30 NOTE — H&P (Signed)
Subjective: 1. Post-operative pain   2. Encounter for removal of ureteral stent   3. Ureteral colic      Consult requested by Dr. Tilden Fossa.  Devin Sanders is a 41 yo who had bilateral ureteroscopy for recurrent cysteine stones.   He presented to the ER yesterday with intractable pain and nausea with vomiting.  A CT demonstrated the stents in good position and no significant residual stone disease.   The stents were to be removed on 9/25 and the patient wanted them out, so he was encouraged to pull them which he did, but the pain didn't abate and he continues to require antiemetics and pain control.     ROS:  Review of Systems  Constitutional:  Negative for chills and fever.  Gastrointestinal:  Positive for abdominal pain, nausea and vomiting.    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)   GERD (gastroesophageal reflux disease)    History of kidney stones    Left ureteral calculus    Renal calculus, left    Wears glasses     Past Surgical History:  Procedure Laterality Date   CYSTOSCOPY WITH RETROGRADE PYELOGRAM, URETEROSCOPY AND STENT PLACEMENT Bilateral 06/26/2017   Procedure: CYSTOSCOPY WITH RETROGRADE PYELOGRAM, URETEROSCOPY, STONE BASKETRY  AND STENT PLACEMENT;  Surgeon: [name], MD;  Location: [facility] [place];  Service: Urology;  Laterality: Bilateral;   CYSTOSCOPY WITH RETROGRADE PYELOGRAM, URETEROSCOPY AND STENT PLACEMENT Right 09/18/2017   Procedure: CYSTOSCOPY WITH RETROGRADE PYELOGRAM, URETEROSCOPY AND STENT PLACEMENT;  Surgeon: Crist Fat, MD;  Location: [facility];  Service: Urology;  Laterality: Right;   CYSTOSCOPY WITH RETROGRADE PYELOGRAM, URETEROSCOPY AND STENT PLACEMENT Bilateral 10/13/2017   Procedure: CYSTOSCOPY WITH RETROGRADE  PYELOGRAM, BILATERAL URETEROSCOPY AND BILATERAL STENT PLACEMENT, LASER;  Surgeon: [name], MD;  Location: [facility];  Service: Urology;  Laterality: Bilateral;  90 MINS   CYSTOSCOPY WITH RETROGRADE PYELOGRAM, URETEROSCOPY AND STENT PLACEMENT Bilateral 03/24/2018   Procedure: CYSTOSCOPY WITH RETROGRADE PYELOGRAM, URETEROSCOPY AND STENT PLACEMENT;  Surgeon: [name], MD;  Location: [facility];  Service: Urology;  Laterality: Bilateral;  1 HR   CYSTOSCOPY WITH RETROGRADE PYELOGRAM, URETEROSCOPY AND STENT PLACEMENT Bilateral 07/07/2018   Procedure: CYSTOSCOPY WITH RETROGRADE PYELOGRAM, URETEROSCOPY AND STENT PLACEMENT;  Surgeon: [name], MD;  Location: [facility];  Service: Urology;  Laterality: Bilateral;  75 MINUTES   CYSTOSCOPY WITH RETROGRADE PYELOGRAM, URETEROSCOPY AND STENT PLACEMENT Left 01/12/2019   Procedure: CYSTOSCOPY WITH RETROGRADE PYELOGRAM, URETEROSCOPY AND STENT PLACEMENT;  Surgeon: [name], MD;  Location: [facility];  Service: Urology;  Laterality: Left;  90 MINS   CYSTOSCOPY WITH RETROGRADE PYELOGRAM, URETEROSCOPY AND STENT PLACEMENT Left 01/28/2019   Procedure: CYSTOSCOPY WITH RETROGRADE PYELOGRAM, URETEROSCOPY AND STENT EXCHANGE STONE BASKET EXTRACTION ;  Surgeon: [name], MD;  Location: [facility];  Service: Urology;  Laterality: Left;   CYSTOSCOPY WITH RETROGRADE PYELOGRAM, URETEROSCOPY AND STENT PLACEMENT Bilateral 05/13/2019   Procedure: CYSTOSCOPY WITH BILATERAL  RETROGRADE PYELOGRAM, LEFT URETEROSCOPY WITH HOLMIUM LASER AND STENT PLACEMENT;  Surgeon: [name], MD;  Location[facility];  Service: Urology;  Laterality: Bilateral;   CYSTOSCOPY WITH RETROGRADE PYELOGRAM, URETEROSCOPY AND STENT PLACEMENT Bilateral 11/02/2019   Procedure: CYSTOSCOPY WITH RETROGRADE PYELOGRAM, URETEROSCOPY AND STENT PLACEMENT;  Surgeon: [name],  MD;  Location: [facility];  Service: Urology;  Laterality: Bilateral;  90 MINS   CYSTOSCOPY WITH  RETROGRADE PYELOGRAM, URETEROSCOPY AND STENT PLACEMENT Bilateral 10/12/2020   Procedure: CYSTOSCOPY WITH RETROGRADE PYELOGRAM, URETEROSCOPY, BASKETING OF STONES AND BILATERAL STENT PLACEMENTS;  Surgeon: [name], MD;  Location: [facility];  Service: Urology;  Laterality: Bilateral;  75 MINS   CYSTOSCOPY WITH RETROGRADE PYELOGRAM, URETEROSCOPY AND STENT PLACEMENT Bilateral 02/15/2021   Procedure: CYSTOSCOPY WITH RETROGRADE PYELOGRAM, URETEROSCOPY AND STENT PLACEMENT;  Surgeon: [name], MD;  Location[facility];  Service: Urology;  Laterality: Bilateral;  75 MINS   CYSTOSCOPY WITH RETROGRADE PYELOGRAM, URETEROSCOPY AND STENT PLACEMENT Bilateral 09/27/2021   Procedure: CYSTOSCOPY WITH RETROGRADE PYELOGRAM, URETEROSCOPY AND STENT PLACEMENT, VASECTOMY;  Surgeon: [name], MD;  Location[facility];  Service: Urology;  Laterality: Bilateral;  75 MINS   CYSTOSCOPY/RETROGRADE/URETEROSCOPY/STONE EXTRACTION WITH BASKET Left 10/05/2017   Procedure: CYSTOSCOPY/RETROGRADE/STONE REMOVAL FROM BLADDER ;  Surgeon: Devin Pippin, MD;  Location: [facility];  Service: Urology;  Laterality: Left;   CYSTOSCOPY/URETEROSCOPY/HOLMIUM LASER/STENT PLACEMENT Right 09/28/2017   Procedure: CYSTOSCOPY/RIGHT RETROGRADE URETEROSCOPY/HOLMIUM LASER/ RIGHT STENT PLACEMENT;  Surgeon: Rene Paci, MD;  Location: [facility];  Service: Urology;  Laterality: Right;   CYSTOSCOPY/URETEROSCOPY/HOLMIUM LASER/STENT PLACEMENT Bilateral 01/27/2018   Procedure: CYSTOSCOPY/URETEROSCOPY/HOLMIUM LASER/STENT PLACEMENT;  Surgeon: [name], MD;  [facility];  Service: Urology;  Laterality: Bilateral;   CYSTOSCOPY/URETEROSCOPY/HOLMIUM LASER/STENT PLACEMENT Right 08/08/2020   Procedure: CYSTOSCOPY/URETEROSCOPY/RETROGRADE/ HOLMIUM LASER/STENT PLACEMENT;  Surgeon: Marcine Matar, MD;  Location[facility];  Service: Urology;  Laterality: Right;   HOLMIUM LASER APPLICATION Bilateral 06/26/2017   Procedure: HOLMIUM LASER  APPLICATION;  Surgeon: [name], MD;  Location[facility];  Service: Urology;  Laterality: Bilateral;   HOLMIUM LASER APPLICATION Right 09/18/2017   Procedure: HOLMIUM LASER APPLICATION;  Surgeon: Crist Fat, MD;  Location: [facility];  Service: Urology;  Laterality: Right;   HOLMIUM LASER APPLICATION Bilateral 10/13/2017   Procedure: HOLMIUM LASER APPLICATION;  Surgeon: [name], MD;  Location: [facility];  Service: Urology;  Laterality: Bilateral;   HOLMIUM LASER APPLICATION Bilateral 01/27/2018   Procedure: HOLMIUM LASER APPLICATION;  Surgeon: [name], MD;  Location: [facility];  Service: Urology;  Laterality: Bilateral;   HOLMIUM LASER APPLICATION Bilateral 03/24/2018   Procedure: HOLMIUM LASER APPLICATION;  Surgeon: [name], MD;  [facility];  Service: Urology;  Laterality: Bilateral;   HOLMIUM LASER APPLICATION Bilateral 07/07/2018   Procedure: HOLMIUM LASER APPLICATION;  Surgeon: [name], MD;  Location[facility];  Service: Urology;  Laterality: Bilateral;   HOLMIUM LASER APPLICATION Left 01/12/2019   Procedure: HOLMIUM LASER APPLICATION;  Surgeon: [name], MD;  Location: [facility];  Service: Urology;  Laterality: Left;   HOLMIUM LASER APPLICATION Bilateral 11/02/2019   Procedure: HOLMIUM LASER APPLICATION;  Surgeon: [name], MD;  Location: [facility];  Service: Urology;  Laterality: Bilateral;   HOLMIUM LASER APPLICATION Bilateral 02/15/2021   Procedure: HOLMIUM LASER APPLICATION;  Surgeon: [name], MD;  Location[facility];  Service: Urology;  Laterality: Bilateral;   HOLMIUM LASER APPLICATION Bilateral 09/27/2021   Procedure: HOLMIUM LASER APPLICATION;  Surgeon: [name], MD;  Location[facility];  Service: Urology;  Laterality: Bilateral;   PERCUTANEOUS NEPHROSTOLITHOTOMY  2003;  2009;  06-22-2014; 01-30-2015  @ [facility]   URETEROSCOPIC STONE MANIPULATION UNILATERAL  multiple since 1997--   last one 02-19-2017  @ [facility]    Social History   Socioeconomic History   Marital  status: Married    Spouse name: Not on file   Number of children: Not on file   Years of education: Not on file   Highest education level: Not on file  Occupational History   Not on file  Tobacco Use   Smoking status: Never   Smokeless tobacco: Never  Vaping Use   Vaping Use: Never used  Substance and Sexual Activity   Alcohol use: Never   Drug use: Never   Sexual activity: Yes  Other Topics Concern   Not on file  Social History Narrative   Not on file   Social Determinants of Health   Financial Resource Strain: Not on file  Food Insecurity: Not on file  Transportation Needs: Not on file  Physical Activity: Not on file  Stress: Not on file  Social Connections: Not on file  Intimate Partner Violence: Not on file    History reviewed. No pertinent family history.  Anti-infectives: Anti-infectives (From admission, onward)    None       Current Facility-Administered Medications  Medication Dose Route Frequency Provider Last Rate Last Admin   HYDROmorphone (DILAUDID) injection 0.5 mg  0.5 mg Intravenous Q4H PRN Devin Pippin, MD       ondansetron [facility]) injection 4 mg  4 mg Intravenous Q8H PRN Devin Pippin, MD       oxyCODONE-acetaminophen (PERCOCET/ROXICET) 5-325 MG per tablet 1 tablet  1 tablet Oral Q4H PRN Devin Pippin, MD   1 tablet at 09/30/21 0981   Current Outpatient Medications  Medication Sig Dispense Refill   cephALEXin (KEFLEX) 500 MG capsule Take 1 capsule (500 mg total) by mouth 2 (two) times daily for 3 days. To prevent infection with tethered stent 6 capsule 0   ondansetron (ZOFRAN-ODT) 4 MG disintegrating tablet Take 1 tablet (4 mg total) by mouth every 8 (eight) hours as needed for nausea or vomiting. 10 tablet 0   oxyCODONE-acetaminophen (PERCOCET/ROXICET) 5-325 MG tablet Take 1 tablet by mouth every 6 (six) hours as needed for severe pain (post-operativley). 15 tablet  0   THIOLA EC 300 MG TBEC Take 300 mg by mouth in the morning, at noon, and at bedtime.       Objective: Vital signs in last 24 hours: BP (!) 146/98   Pulse 62   Temp 97.6 F (36.4 C)   Resp 18   SpO2 94%   Intake/Output from previous day: No intake/output data recorded. Intake/Output this shift: No intake/output data recorded.   Physical Exam Vitals reviewed.  Constitutional:      Appearance: Normal appearance.  Cardiovascular:     Rate and Rhythm: Normal rate and regular rhythm.  Pulmonary:     Effort: Pulmonary effort is normal. No respiratory distress.     Breath sounds: Normal breath sounds.  Abdominal:     General: Abdomen is flat.     Palpations: Abdomen is soft.     Tenderness: There is abdominal tenderness (LLQ).  Neurological:     Mental Status: He is alert.     Lab Results:  Results for orders placed or performed during the hospital encounter of 09/29/21 (from the past 24 hour(s))  Basic metabolic panel     Status: Abnormal   Collection Time: 09/29/21  5:46 PM  Result Value Ref Range   Sodium 137 135 - 145 mmol/L   Potassium 3.8 3.5 - 5.1 mmol/L   Chloride 107 98 - 111 mmol/L   CO2 24 22 - 32 mmol/L   Glucose, Bld 94  70 - 99 mg/dL   BUN 26 (H) 6 - 20 mg/dL   Creatinine, Ser 1.07 0.61 - 1.24 mg/dL   Calcium 9.0 8.9 - 10.3 mg/dL   GFR, Estimated >60 >60 mL/min   Anion gap 6 5 - 15  CBC with Differential     Status: None   Collection Time: 09/29/21  5:46 PM  Result Value Ref Range   WBC 7.2 4.0 - 10.5 K/uL   RBC 4.23 4.22 - 5.81 MIL/uL   Hemoglobin 14.0 13.0 - 17.0 g/dL   HCT 41.1 39.0 - 52.0 %   MCV 97.2 80.0 - 100.0 fL   MCH 33.1 26.0 - 34.0 pg   MCHC 34.1 30.0 - 36.0 g/dL   RDW 11.8 11.5 - 15.5 %   Platelets 252 150 - 400 K/uL   nRBC 0.0 0.0 - 0.2 %   Neutrophils Relative % 64 %   Neutro Abs 4.6 1.7 - 7.7 K/uL   Lymphocytes Relative 28 %   Lymphs Abs 2.0 0.7 - 4.0 K/uL   Monocytes Relative 7 %   Monocytes Absolute 0.5 0.1 - 1.0 K/uL    Eosinophils Relative 1 %   Eosinophils Absolute 0.1 0.0 - 0.5 K/uL   Basophils Relative 0 %   Basophils Absolute 0.0 0.0 - 0.1 K/uL   Immature Granulocytes 0 %   Abs Immature Granulocytes 0.01 0.00 - 0.07 K/uL  Urinalysis, Routine w reflex microscopic Urine, Clean Catch     Status: Abnormal   Collection Time: 09/29/21  5:46 PM  Result Value Ref Range   Color, Urine RED (A) YELLOW   APPearance CLOUDY (A) CLEAR   Specific Gravity, Urine 1.035 (H) 1.005 - 1.030   pH 6.0 5.0 - 8.0   Glucose, UA NEGATIVE NEGATIVE mg/dL   Hgb urine dipstick LARGE (A) NEGATIVE   Bilirubin Urine NEGATIVE NEGATIVE   Ketones, ur NEGATIVE NEGATIVE mg/dL   Protein, ur 100 (A) NEGATIVE mg/dL   Nitrite NEGATIVE NEGATIVE   Leukocytes,Ua SMALL (A) NEGATIVE   RBC / HPF >50 (H) 0 - 5 RBC/hpf   WBC, UA 11-20 0 - 5 WBC/hpf   Bacteria, UA NONE SEEN NONE SEEN    BMET Recent Labs    09/29/21 1746  NA 137  K 3.8  CL 107  CO2 24  GLUCOSE 94  BUN 26*  CREATININE 1.07  CALCIUM 9.0   PT/INR No results for input(s): "LABPROT", "INR" in the last 72 hours. ABG No results for input(s): "PHART", "HCO3" in the last 72 hours.  Invalid input(s): "PCO2", "PO2"  Studies/Results: CT Renal Stone Study  Result Date: 09/29/2021 CLINICAL DATA:  Renal stones, flank pain EXAM: CT ABDOMEN AND PELVIS WITHOUT CONTRAST TECHNIQUE: Multidetector CT imaging of the abdomen and pelvis was performed following the standard protocol without IV contrast. RADIATION DOSE REDUCTION: This exam was performed according to the departmental dose-optimization program which includes automated exposure control, adjustment of the mA and/or kV according to patient size and/or use of iterative reconstruction technique. COMPARISON:  Renal sonogram done on 09/09/2021, CT done on 02/14/2021 FINDINGS: Lower chest: There are new small patchy ground-glass densities in both lower lung fields. Hepatobiliary: No focal abnormalities are seen in liver. There is no  dilation of bile ducts. Gallbladder is unremarkable. Pancreas: No focal abnormalities are seen. Spleen: Unremarkable. Adrenals/Urinary Tract: Adrenals are unremarkable. There is no hydronephrosis. There are multiple small bilateral renal stones each measuring less than 6 mm. There is 1.2 cm high density structure in the  lower pole of left kidney which was not evident in the previous CT. This may suggest hemorrhagic cyst or calcification related to previous intervention. Bilateral ureteral stents are seen. Ureteral stents appear to be projecting into the prostatic urethra, possibly suggesting previous intervention in prostate. There are ill-defined fluid density lesions in the upper pole of left kidney and midportion of right kidney, possibly renal cysts. Ureters are not dilated. Urinary bladder is not distended. Stomach/Bowel: Small hiatal hernia is seen. Stomach is not distended. Small bowel loops are not dilated. Appendix is not dilated. There is no significant wall thickening in colon. There is no pericolic stranding. Vascular/Lymphatic: Unremarkable. Reproductive: Unremarkable. Other: There is no ascites or pneumoperitoneum. Small umbilical hernia containing fat is seen. Bilateral inguinal hernias containing fat are noted. Musculoskeletal: No acute findings are seen. IMPRESSION: There are multiple new small foci of ground-glass densities in both lower lung fields suggesting possible multifocal pneumonia. There is no pleural effusion. There is no evidence of intestinal obstruction or pneumoperitoneum. There is no hydronephrosis. Bilateral ureteral stents are seen. Bilateral renal stones are seen, more so in the left kidney. There is 1.2 cm high density structure in the lower pole of left kidney, possibly calcification related to previous intervention. There are possible small cysts in both kidneys. Other findings as described in the body of the report. Electronically Signed   By: Ernie Avena M.D.   On:  09/29/2021 18:24     Assessment/Plan: Intractable pain with N/V s/p bilateral ureteroscopy on 9/22 and stent removal in ER on 9/24.   He will be admitted for antiemetics and pain control.  Dr. Berneice Heinrich notified.   Meds ordered this encounter  Medications   ketorolac (TORADOL) 15 MG/ML injection 15 mg   ondansetron (ZOFRAN) injection 4 mg   HYDROmorphone (DILAUDID) injection 0.5 mg   diphenhydrAMINE (BENADRYL) injection 25 mg   HYDROmorphone (DILAUDID) injection 0.5 mg   HYDROmorphone (DILAUDID) injection 0.5 mg   diphenhydrAMINE (BENADRYL) injection 12.5 mg   HYDROmorphone (DILAUDID) injection 0.5 mg   oxyCODONE-acetaminophen (PERCOCET/ROXICET) 5-325 MG per tablet 1 tablet   HYDROmorphone (DILAUDID) injection 0.5 mg   ondansetron (ZOFRAN) injection 4 mg   HYDROmorphone (DILAUDID) injection 0.5 mg     Orders Placed This Encounter  Procedures   CT Renal Stone Study    Standing Status:   Standing    Number of Occurrences:   1   Basic metabolic panel    Standing Status:   Standing    Number of Occurrences:   1   CBC with Differential    Standing Status:   Standing    Number of Occurrences:   1   Urinalysis, Routine w reflex microscopic    Standing Status:   Standing    Number of Occurrences:   1   Page Admitting Doctor upon patients arrival to unit/floor    Standing Status:   Standing    Number of Occurrences:   1   Vital signs    Standing Status:   Standing    Number of Occurrences:   1   Up with assistance    Standing Status:   Standing    Number of Occurrences:   [phone]   Initiate Carrier Fluid Protocol    Standing Status:   Standing    Number of Occurrences:   1   Consult to urology    Standing Status:   Standing    Number of Occurrences:   1    Order Specific Question:  Place call to:    Answer:   oncall Urology    Order Specific Question:   Reason for Consult    Answer:   Consult   Saline lock IV    Standing Status:   Standing    Number of Occurrences:   1    Place in observation (patient's expected length of stay will be less than 2 midnights)    Standing Status:   Standing    Number of Occurrences:   1    Order Specific Question:   Hospital [facility]    [facility] [[id number][facility]    Order Specific Question:   Level of Care    Answer:   Med-Surg [[id number]]    Order Specific Question:   [facility] if equivalent level of care is available:    Answer:   No    Order Specific Question:   Covid Evaluation    Answer:   Asymptomatic - no recent exposure (last 10 days) testing not required    Order Specific Question:   Diagnosis    Answer:   Ureteral colic [[id number]][[id number]]    Order Specific Question:   Admitting Physician    Answer:   [facility] [[id number]]    Order Specific Question:   Attending Physician    Answer:   [facility] [[id number]]     No follow-ups on file.    CC: Dr. Sebastian Acheheodore Manny.     Devin PippinJohn Taneil Sanders 09/30/2021

## 2021-09-30 NOTE — Anesthesia Postprocedure Evaluation (Signed)
Anesthesia Post Note  Patient: RHYDIAN BALDI  Procedure(s) Performed: CYSTOSCOPY WITH RETROGRADE PYELOGRAM, URETEROSCOPY AND STENT PLACEMENT, VASECTOMY (Bilateral) HOLMIUM LASER APPLICATION (Bilateral)     Patient location during evaluation: PACU Anesthesia Type: General Level of consciousness: awake and alert Pain management: pain level controlled Vital Signs Assessment: post-procedure vital signs reviewed and stable Respiratory status: spontaneous breathing, nonlabored ventilation and respiratory function stable Cardiovascular status: blood pressure returned to baseline and stable Postop Assessment: no apparent nausea or vomiting Anesthetic complications: no   No notable events documented.  Last Vitals:  Vitals:   09/27/21 1616 09/27/21 1640  BP:  116/88  Pulse: 66 65  Resp: 11 18  Temp:  36.6 C  SpO2: 95% 96%    Last Pain:  Vitals:   09/27/21 1640  TempSrc:   PainSc: 0-No pain                 Lynda Rainwater

## 2021-10-01 MED ORDER — ACETAMINOPHEN 325 MG PO TABS
650.0000 mg | ORAL_TABLET | Freq: Four times a day (QID) | ORAL | Status: DC
  Administered 2021-10-01 (×2): 650 mg via ORAL
  Filled 2021-10-01 (×2): qty 2

## 2021-10-01 MED ORDER — HYDROMORPHONE HCL 1 MG/ML IJ SOLN
0.5000 mg | INTRAMUSCULAR | Status: DC | PRN
  Administered 2021-10-01 – 2021-10-03 (×14): 0.5 mg via INTRAVENOUS
  Filled 2021-10-01 (×14): qty 0.5

## 2021-10-01 MED ORDER — ACETAMINOPHEN 500 MG PO TABS
1000.0000 mg | ORAL_TABLET | Freq: Three times a day (TID) | ORAL | Status: DC
  Administered 2021-10-01 – 2021-10-03 (×6): 1000 mg via ORAL
  Filled 2021-10-01 (×6): qty 2

## 2021-10-01 MED ORDER — OXYCODONE HCL 5 MG PO TABS
10.0000 mg | ORAL_TABLET | Freq: Four times a day (QID) | ORAL | Status: DC | PRN
  Administered 2021-10-01 – 2021-10-03 (×8): 10 mg via ORAL
  Filled 2021-10-01 (×8): qty 2

## 2021-10-01 MED ORDER — HYDROMORPHONE HCL 1 MG/ML IJ SOLN
0.5000 mg | Freq: Once | INTRAMUSCULAR | Status: AC
  Administered 2021-10-01: 0.5 mg via INTRAVENOUS
  Filled 2021-10-01: qty 0.5

## 2021-10-01 MED ORDER — TAMSULOSIN HCL 0.4 MG PO CAPS
0.4000 mg | ORAL_CAPSULE | Freq: Every day | ORAL | Status: DC
  Administered 2021-10-01 – 2021-10-03 (×3): 0.4 mg via ORAL
  Filled 2021-10-01 (×3): qty 1

## 2021-10-01 NOTE — Progress Notes (Signed)
  Transition of Care [facility]) Screening Note   Patient Details  Name: Devin Sanders Date of Birth: [birth year]   Transition of Care [facility]) CM/SW Contact:    Dessa Phi, RN Phone Number: 10/01/2021, 11:46 AM    Transition of Care Department [facility]) has reviewed patient and no TOC needs have been identified at this time. We will continue to monitor patient advancement through interdisciplinary progression rounds. If new patient transition needs arise, please place a TOC consult.

## 2021-10-01 NOTE — Plan of Care (Signed)
  Problem: Education: Goal: Knowledge of General Education information will improve Description: Including pain rating scale, medication(s)/side effects and non-pharmacologic comfort measures Outcome: Completed/Met   Problem: Clinical Measurements: Goal: Respiratory complications will improve Outcome: Completed/Met Goal: Cardiovascular complication will be avoided Outcome: Completed/Met   Problem: Activity: Goal: Risk for activity intolerance will decrease Outcome: Completed/Met

## 2021-10-01 NOTE — Progress Notes (Signed)
Subjective/Chief Complaint:  1 - Cysteinuria / Recurrent Urolithiasis  - s/p bilateral ureteroscopic stone manipulaiton on 09/27/21. Admitted for pain control 9/25 for pain control after stent pull. GFR at baseline. This has happned numerous times prior.  Today "Devin Sanders" is stable. STill with difficult to control pain on max non-narcotic pain meds.    Objective: Vital signs in last 24 hours: Temp:  [97.5 F (36.4 C)-98.4 F (36.9 C)] 98.4 F (36.9 C) (09/26 1112) Pulse Rate:  [67-90] 79 (09/26 1112) Resp:  [16-18] 18 (09/26 1112) BP: (113-208)/(58-82) 119/76 (09/26 1112) SpO2:  [95 %-96 %] 96 % (09/26 1112) Last BM Date : 09/29/21  Intake/Output from previous day: 09/25 0701 - 09/26 0700 In: 240 [P.O.:240] Out: 500 [Urine:500] Intake/Output this shift: Total I/O In: 480 [P.O.:480] Out: -   NAD, AOx3, at baseline Wearing glasses Stable mild truncal obesity No CVAT No c/c/e  Lab Results:  Recent Labs    09/29/21 1746  WBC 7.2  HGB 14.0  HCT 41.1  PLT 252   BMET Recent Labs    09/29/21 1746  NA 137  K 3.8  CL 107  CO2 24  GLUCOSE 94  BUN 26*  CREATININE 1.07  CALCIUM 9.0   PT/INR No results for input(s): "LABPROT", "INR" in the last 72 hours. ABG No results for input(s): "PHART", "HCO3" in the last 72 hours.  Invalid input(s): "PCO2", "PO2"  Studies/Results: CT Renal Stone Study  Result Date: 09/29/2021 CLINICAL DATA:  Renal stones, flank pain EXAM: CT ABDOMEN AND PELVIS WITHOUT CONTRAST TECHNIQUE: Multidetector CT imaging of the abdomen and pelvis was performed following the standard protocol without IV contrast. RADIATION DOSE REDUCTION: This exam was performed according to the departmental dose-optimization program which includes automated exposure control, adjustment of the mA and/or kV according to patient size and/or use of iterative reconstruction technique. COMPARISON:  Renal sonogram done on 09/09/2021, CT done on 02/14/2021 FINDINGS: Lower  chest: There are new small patchy ground-glass densities in both lower lung fields. Hepatobiliary: No focal abnormalities are seen in liver. There is no dilation of bile ducts. Gallbladder is unremarkable. Pancreas: No focal abnormalities are seen. Spleen: Unremarkable. Adrenals/Urinary Tract: Adrenals are unremarkable. There is no hydronephrosis. There are multiple small bilateral renal stones each measuring less than 6 mm. There is 1.2 cm high density structure in the lower pole of left kidney which was not evident in the previous CT. This may suggest hemorrhagic cyst or calcification related to previous intervention. Bilateral ureteral stents are seen. Ureteral stents appear to be projecting into the prostatic urethra, possibly suggesting previous intervention in prostate. There are ill-defined fluid density lesions in the upper pole of left kidney and midportion of right kidney, possibly renal cysts. Ureters are not dilated. Urinary bladder is not distended. Stomach/Bowel: Small hiatal hernia is seen. Stomach is not distended. Small bowel loops are not dilated. Appendix is not dilated. There is no significant wall thickening in colon. There is no pericolic stranding. Vascular/Lymphatic: Unremarkable. Reproductive: Unremarkable. Other: There is no ascites or pneumoperitoneum. Small umbilical hernia containing fat is seen. Bilateral inguinal hernias containing fat are noted. Musculoskeletal: No acute findings are seen. IMPRESSION: There are multiple new small foci of ground-glass densities in both lower lung fields suggesting possible multifocal pneumonia. There is no pleural effusion. There is no evidence of intestinal obstruction or pneumoperitoneum. There is no hydronephrosis. Bilateral ureteral stents are seen. Bilateral renal stones are seen, more so in the left kidney. There is 1.2 cm high density  structure in the lower pole of left kidney, possibly calcification related to previous intervention. There are  possible small cysts in both kidneys. Other findings as described in the body of the report. Electronically Signed   By: Elmer Picker M.D.   On: 09/29/2021 18:24    Anti-infectives: Anti-infectives (From admission, onward)    None       Assessment/Plan:  Remain in house for pain control. He is stone free and stent free. I am concerned he has develop hyperalgesia / subconcious drug seeking tendencies as a byuproduct of his serious metabolic stone disease.   Will plan for DC home when he feels symptoms are managable in outpatient setting.   Alexis Frock 10/01/2021

## 2021-10-01 NOTE — Plan of Care (Signed)
  Problem: Health Behavior/Discharge Planning: Goal: Ability to manage health-related needs will improve Outcome: Progressing   Problem: Nutrition: Goal: Adequate nutrition will be maintained Outcome: Progressing   Problem: Elimination: Goal: Will not experience complications related to bowel motility Outcome: Progressing Goal: Will not experience complications related to urinary retention Outcome: Progressing   Problem: Safety: Goal: Ability to remain free from injury will improve Outcome: Progressing   

## 2021-10-01 NOTE — Progress Notes (Signed)
Pt feels that pain is inadequately managed. Paged on call two times with no response. Pt has received PRN pain medication every 4 hours as scheduled with minimal relief. Repeated page, awaiting response.

## 2021-10-02 DIAGNOSIS — Z79899 Other long term (current) drug therapy: Secondary | ICD-10-CM | POA: Diagnosis not present

## 2021-10-02 DIAGNOSIS — R109 Unspecified abdominal pain: Secondary | ICD-10-CM | POA: Diagnosis present

## 2021-10-02 DIAGNOSIS — N23 Unspecified renal colic: Secondary | ICD-10-CM | POA: Diagnosis present

## 2021-10-02 DIAGNOSIS — E669 Obesity, unspecified: Secondary | ICD-10-CM | POA: Diagnosis present

## 2021-10-02 DIAGNOSIS — G8918 Other acute postprocedural pain: Secondary | ICD-10-CM | POA: Diagnosis present

## 2021-10-02 DIAGNOSIS — Z8616 Personal history of COVID-19: Secondary | ICD-10-CM | POA: Diagnosis not present

## 2021-10-02 DIAGNOSIS — Z91018 Allergy to other foods: Secondary | ICD-10-CM | POA: Diagnosis not present

## 2021-10-02 DIAGNOSIS — K219 Gastro-esophageal reflux disease without esophagitis: Secondary | ICD-10-CM | POA: Diagnosis present

## 2021-10-02 DIAGNOSIS — Z6831 Body mass index (BMI) 31.0-31.9, adult: Secondary | ICD-10-CM | POA: Diagnosis not present

## 2021-10-02 DIAGNOSIS — Z885 Allergy status to narcotic agent status: Secondary | ICD-10-CM | POA: Diagnosis not present

## 2021-10-02 DIAGNOSIS — E7201 Cystinuria: Secondary | ICD-10-CM | POA: Diagnosis present

## 2021-10-02 DIAGNOSIS — Z87442 Personal history of urinary calculi: Secondary | ICD-10-CM | POA: Diagnosis not present

## 2021-10-02 MED ORDER — SENNOSIDES-DOCUSATE SODIUM 8.6-50 MG PO TABS
1.0000 | ORAL_TABLET | Freq: Two times a day (BID) | ORAL | Status: DC
  Administered 2021-10-02 – 2021-10-03 (×3): 1 via ORAL
  Filled 2021-10-02 (×3): qty 1

## 2021-10-02 MED ORDER — SODIUM CHLORIDE 0.9 % IV SOLN
25.0000 mg | Freq: Four times a day (QID) | INTRAVENOUS | Status: DC | PRN
  Administered 2021-10-02 – 2021-10-03 (×3): 25 mg via INTRAVENOUS
  Filled 2021-10-02 (×3): qty 25

## 2021-10-02 NOTE — Progress Notes (Signed)
   Subjective/Chief Complaint:  1 - Cysteinuria / Recurrent Urolithiasis  - s/p bilateral ureteroscopic stone manipulaiton on 09/27/21. Admitted for pain control 9/25 for pain control after stent pull. GFR at baseline. This has happned numerous times prior.  Today "[name]" is stable / slowly imrpoving. Some waves of colic, but trending better.    Objective: Vital signs in last 24 hours: Temp:  [97.8 F (36.6 C)-98.1 F (36.7 C)] 97.8 F (36.6 C) (09/27 0900) Pulse Rate:  [83-86] 86 (09/27 0900) Resp:  [16] 16 (09/27 0900) BP: (111-140)/(78-84) 111/78 (09/27 0900) SpO2:  [96 %] 96 % (09/27 0900) Last BM Date : 09/29/21  Intake/Output from previous day: 09/26 0701 - 09/27 0700 In: 840 [P.O.:840] Out: 1400 [Urine:1400] Intake/Output this shift: No intake/output data recorded.  NAD, AOx3, at baseline Wearing glasses Stable mild truncal obesity No CVAT No c/c/e  Lab Results:  Recent Labs    09/29/21 1746  WBC 7.2  HGB 14.0  HCT 41.1  PLT 252   BMET Recent Labs    09/29/21 1746  NA 137  K 3.8  CL 107  CO2 24  GLUCOSE 94  BUN 26*  CREATININE 1.07  CALCIUM 9.0   PT/INR No results for input(s): "LABPROT", "INR" in the last 72 hours. ABG No results for input(s): "PHART", "HCO3" in the last 72 hours.  Invalid input(s): "PCO2", "PO2"  Studies/Results: No results found.  Anti-infectives: Anti-infectives (From admission, onward)    None       Assessment/Plan:  Discussed goals for DC. He is close but not there yet, likely tomorrow. Continue tylenol, ketorolac and PRN osxy + breakthrough hydromorphone.    Alexis Frock 10/02/2021

## 2021-10-02 NOTE — Progress Notes (Signed)
Patient last BM was confirmed by patient to have been 09/29/21. Pt stated he usually goes daily. Recommending stool softener/ mild LOC as needed. Will make day shift nurse aware.

## 2021-10-03 DIAGNOSIS — R109 Unspecified abdominal pain: Secondary | ICD-10-CM | POA: Diagnosis present

## 2021-10-03 MED ORDER — OXYCODONE HCL 10 MG PO TABS: 10.0000 mg | ORAL_TABLET | Freq: Four times a day (QID) | ORAL | 0 refills | Status: DC | PRN

## 2021-10-03 MED ORDER — KETOROLAC TROMETHAMINE 10 MG PO TABS: 10.0000 mg | ORAL_TABLET | Freq: Three times a day (TID) | ORAL | 0 refills | Status: DC | PRN

## 2021-10-03 MED ORDER — PROMETHAZINE HCL 25 MG PO TABS: 25.0000 mg | ORAL_TABLET | Freq: Three times a day (TID) | ORAL | 0 refills | Status: DC | PRN

## 2021-10-03 NOTE — Discharge Summary (Signed)
Physician Discharge Summary  Patient ID: Devin Sanders MRN: [id number] DOB/AGE: 1980/04/13 41 y.o.  Admit date: 09/29/2021 Discharge date: 10/03/2021  Admission Diagnoses: Refracotry Post-Op Flank Pain / Colic  Discharge Diagnoses:  Principal Problem:   Ureteral colic   Discharged Condition: good  Hospital Course:   1 - Cysteinuria / Recurrent Urolithiasis  - s/p bilateral ureteroscopic stone manipulaiton on 09/27/21. Admitted for pain control 9/25 for pain control after stent pull. GFR at baseline. This has happned numerous times prior. He was managed with max non-narcotic regimen with scheduled tylenol + ketorolac and breakthrough oxycodone, hydromorphone PRN. By the afternoon of 9/28, the day of discharge, his discomfort was tolerable on PO regiment and felt to be adequate for discharge. Most recent Cr 1.07.    Consults: None  Significant Diagnostic Studies: labs: as per above  Treatments: IV hydration and analgesia: acetaminophen and Dilaudid  Discharge Exam: Blood pressure 100/74, pulse 86, temperature 97.8 F (36.6 C), temperature source Oral, resp. rate 18, height 5\' 10"  (1.778 m), weight 100 kg, SpO2 94 %.  NAD, wearing glasses, at baseline Non-labored breathing on room air RRR No CVAT at present Stable mild truncal obesity No c/c/e  Disposition:  HOME There are no questions and answers to display.           Follow-up Information     Alexis Frock, MD. Call in 2 days.   Specialty: Urology Why: As needed Contact information: [address] [place] [id number] [phone]                 Signed: Alexis Frock 10/03/2021, 12:42 PM

## 2021-10-03 NOTE — Progress Notes (Signed)
Assumed care of patient from off-going nurse. Agree with previous assessment. Introduced self to patient and addressed concerns. Call light within reach.

## 2021-10-03 NOTE — Plan of Care (Signed)
  Problem: Health Behavior/Discharge Planning: Goal: Ability to manage health-related needs will improve Outcome: Progressing   Problem: Nutrition: Goal: Adequate nutrition will be maintained Outcome: Progressing   Problem: Coping: Goal: Level of anxiety will decrease Outcome: Progressing   Problem: Pain Managment: Goal: General experience of comfort will improve Outcome: Progressing   

## 2021-10-03 NOTE — Progress Notes (Signed)
Mobility Specialist Cancellation/Refusal Note:    10/03/21 0948  Mobility  Activity Refused mobility    Reason for Cancellation/Refusal: Pt declined mobility at this time. Pt nauseous  waiting on meds. Requested afternoon ambulation.  Will check back as schedule permits.     [facility]

## 2021-10-26 IMAGING — CT CT RENAL STONE PROTOCOL
2 of 4 series · 16 of 46 positions shown, 18 images · non-contrast
Comparison: 10/13/2020

CLINICAL DATA: Urinary tract stone, symptomatic/complicated.
Ureteral stent removal earlier today, now with urinary hesitancy.

EXAM:
CT ABDOMEN AND PELVIS WITHOUT CONTRAST
TECHNIQUE: Multidetector CT imaging of the abdomen and pelvis was performed
following the standard protocol without IV contrast.

[Series 2: axial st · axial · 0.97mm/px · z∈[-503,-28]mm · 13 of 107 slices shown, 15 images]
[im 6/107  soft-tissue]
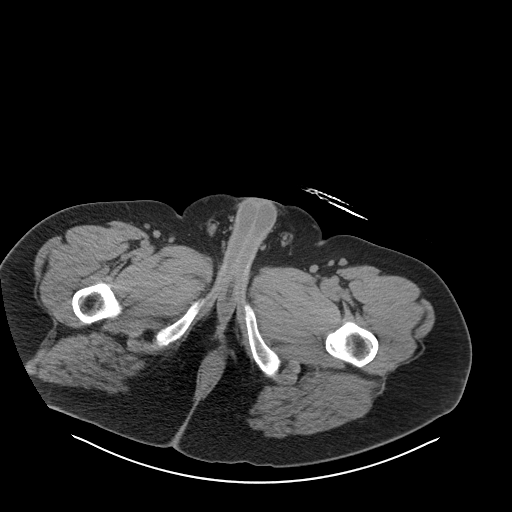
[im 6/107  bone]
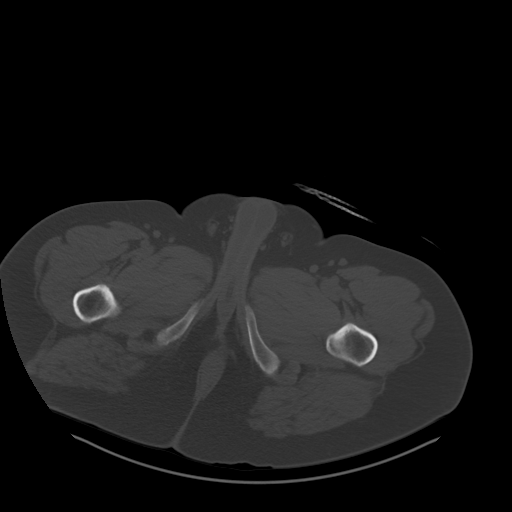
[im 17/107  soft-tissue]
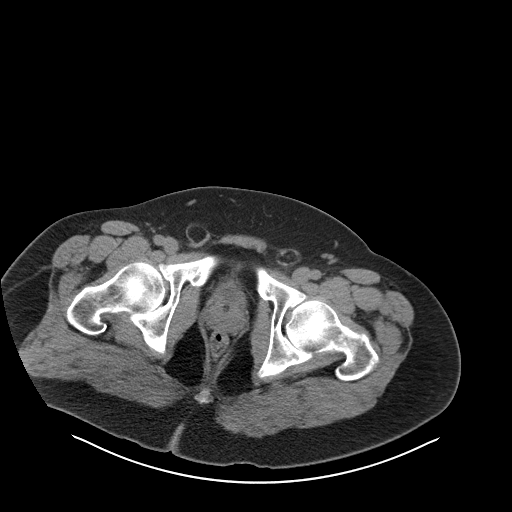
[im 23/107  soft-tissue]
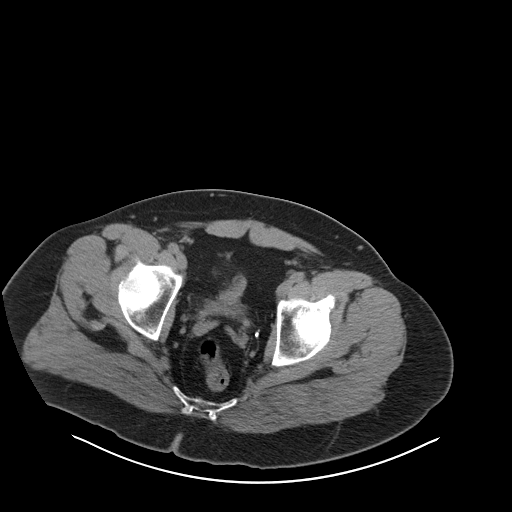
[im 28/107  soft-tissue]
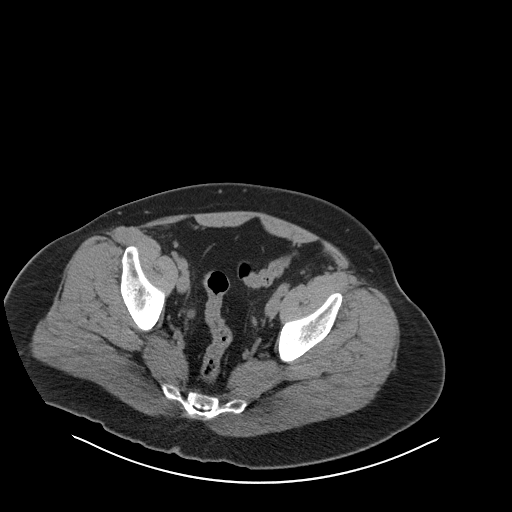
[im 40/107  soft-tissue]
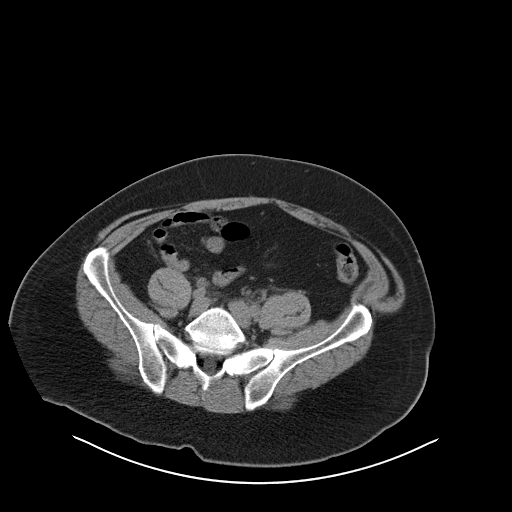
[im 45/107  soft-tissue]
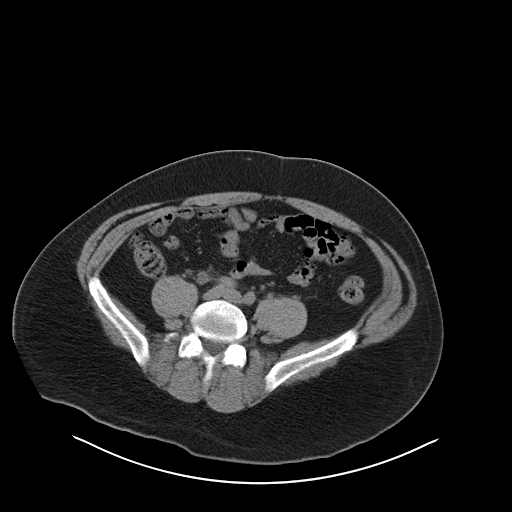
[im 56/107  soft-tissue]
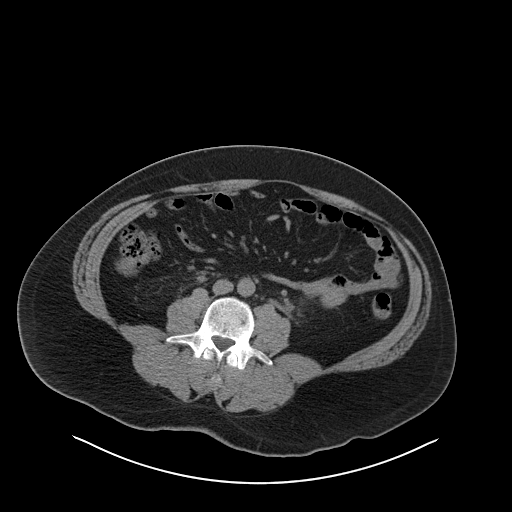
[im 62/107  soft-tissue]
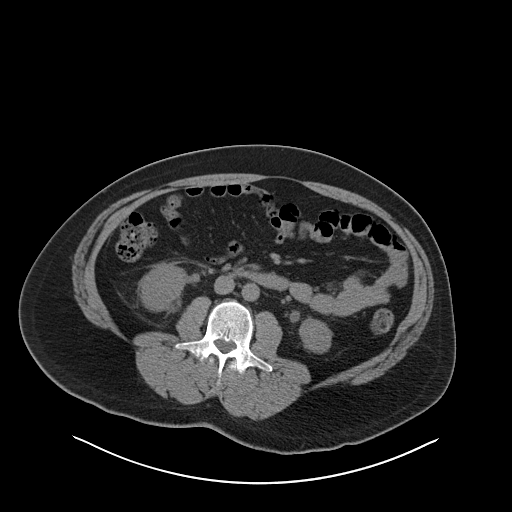
[im 67/107  soft-tissue]
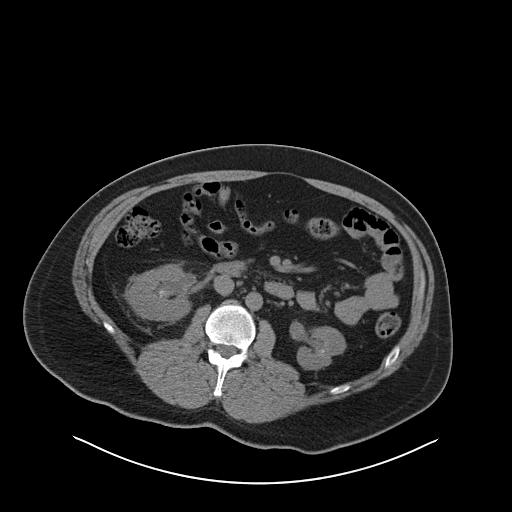
[im 67/107  bone]
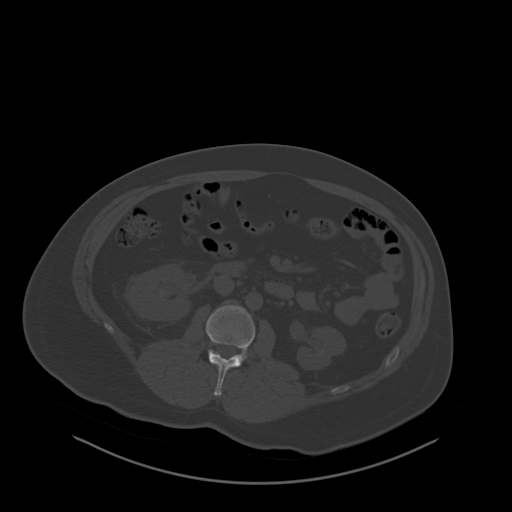
[im 79/107  soft-tissue]
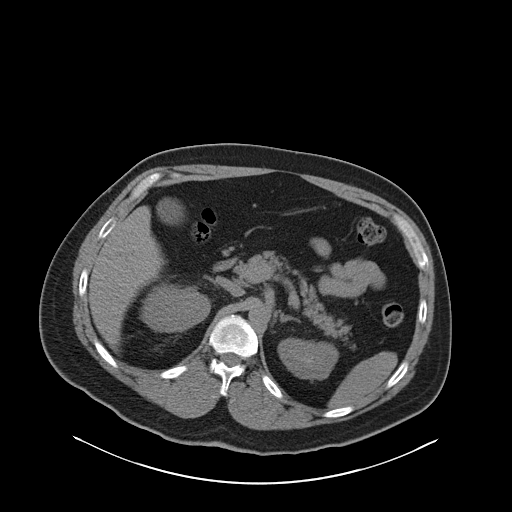
[im 84/107  soft-tissue]
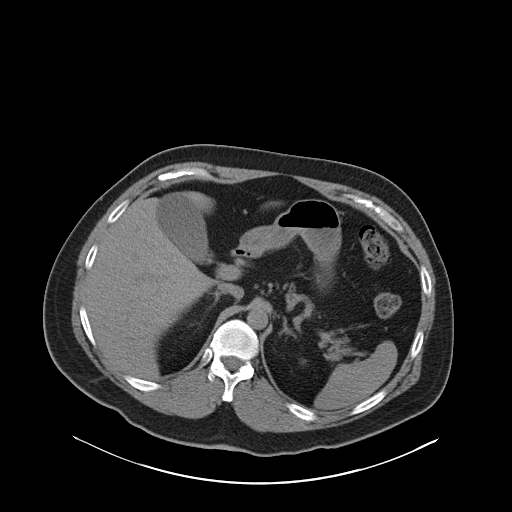
[im 90/107  soft-tissue]
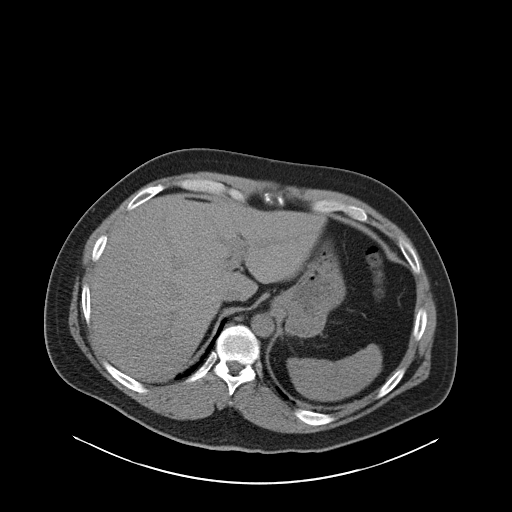
[im 101/107  soft-tissue]
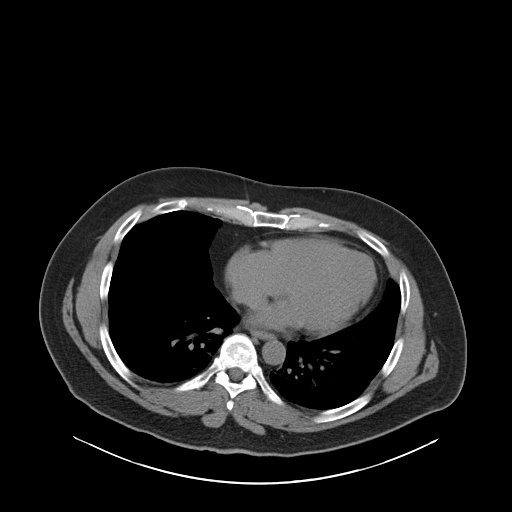

[Series 5: coronal · coronal · 0.92mm/px · 3 of 166 slices shown]
[im 56/166  soft-tissue]
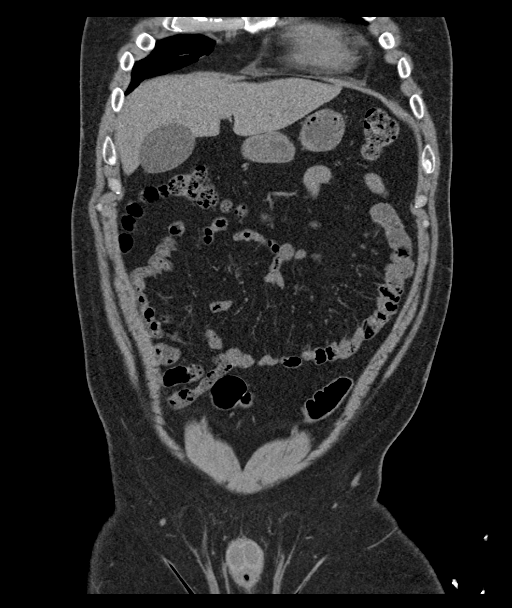
[im 74/166  soft-tissue]
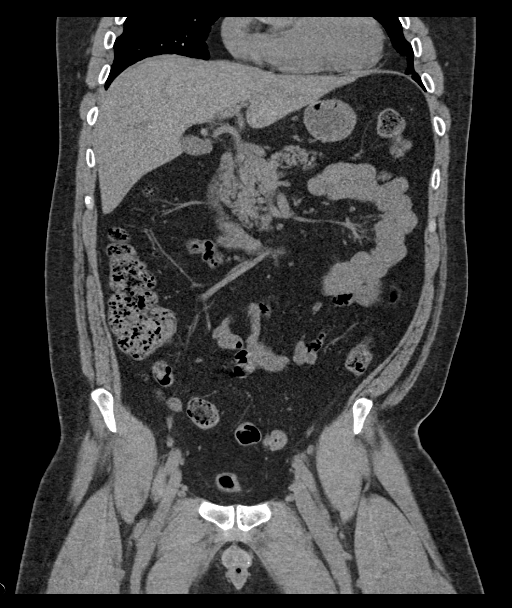
[im 92/166  soft-tissue]
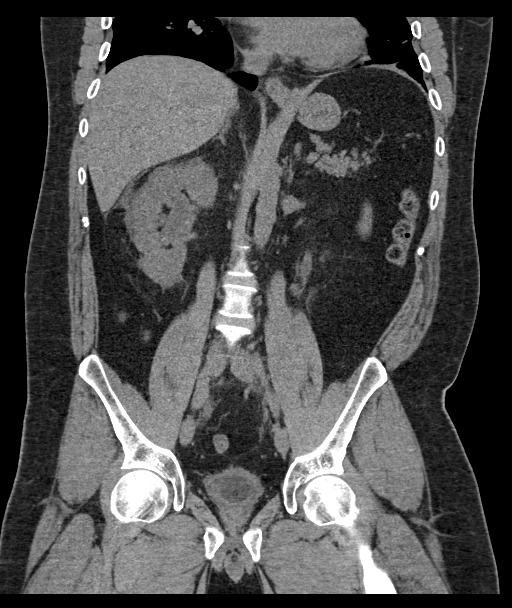

[16 of 46 positions shown; findings below may reference images not displayed]

FINDINGS: Lower chest: Mild bibasilar atelectasis. The visualized heart and
pericardium are unremarkable.

Hepatobiliary: No focal liver abnormality is seen. No gallstones,
gallbladder wall thickening, or biliary dilatation.

Pancreas: Unremarkable

Spleen: Unremarkable

Adrenals/Urinary Tract: The adrenal glands are unremarkable. The
kidneys are normal in position. Mild left asymmetric cortical
scarring. Since the prior examination, the bilateral ureteral stents
have been removed. There has developed mild right hydronephrosis and
perinephric and periureteric inflammatory stranding. Nonobstructing
calculi measuring up to 8 mm calculus within the lower pole the
right kidney are unchanged. No ureteral calculus. On the left,
numerous nonobstructing renal calculi are again identified measuring
up to 5 mm. No hydronephrosis on the left. No ureteral calculi
identified. Foley catheter balloon seen within the bladder lumen
which appears decompressed.

Stomach/Bowel: Stomach is within normal limits. Appendix appears
normal. No evidence of bowel wall thickening, distention, or
inflammatory changes. No free intraperitoneal gas or fluid.

Vascular/Lymphatic: No significant vascular findings are present. No
enlarged abdominal or pelvic lymph nodes.

Reproductive: Prostate is unremarkable.

Other: Small bilateral fat containing umbilical hernias noted.

Musculoskeletal: No acute bone abnormality. No lytic or blastic bone
lesion.
IMPRESSION: Interval removal of bilateral ureteral stents with development of
mild right hydronephrosis and perinephric and periureteric
inflammatory stranding. No ureteral calculus identified. This may
relate to postprocedural edema, ureteral spasm, or an underlying
stricture at the ureterovesicular junction.

Stable bilateral nonobstructing nephrolithiasis.

## 2022-02-04 ENCOUNTER — Emergency Department (HOSPITAL_COMMUNITY)
Admission: EM | Admit: 2022-02-04 | Discharge: 2022-02-05 | Disposition: A | Discharge status: Home / Self Care | Payer: Commercial Managed Care - PPO | Attending: Emergency Medicine | Admitting: Emergency Medicine

## 2022-02-04 ENCOUNTER — Emergency Department (HOSPITAL_COMMUNITY): Payer: Commercial Managed Care - PPO

## 2022-02-04 ENCOUNTER — Encounter (HOSPITAL_COMMUNITY): Payer: Self-pay

## 2022-02-04 DIAGNOSIS — I10 Essential (primary) hypertension: Secondary | ICD-10-CM | POA: Insufficient documentation

## 2022-02-04 DIAGNOSIS — R112 Nausea with vomiting, unspecified: Secondary | ICD-10-CM | POA: Diagnosis not present

## 2022-02-04 DIAGNOSIS — R109 Unspecified abdominal pain: Secondary | ICD-10-CM

## 2022-02-04 DIAGNOSIS — R000 Tachycardia, unspecified: Secondary | ICD-10-CM | POA: Insufficient documentation

## 2022-02-04 DIAGNOSIS — Z79899 Other long term (current) drug therapy: Secondary | ICD-10-CM | POA: Insufficient documentation

## 2022-02-04 DIAGNOSIS — R1032 Left lower quadrant pain: Secondary | ICD-10-CM | POA: Diagnosis not present

## 2022-02-04 LAB — URINALYSIS, ROUTINE W REFLEX MICROSCOPIC
Bacteria, UA: NONE SEEN
Bilirubin Urine: NEGATIVE
Glucose, UA: NEGATIVE mg/dL
Ketones, ur: NEGATIVE mg/dL
Leukocytes,Ua: NEGATIVE
Nitrite: NEGATIVE
Protein, ur: NEGATIVE mg/dL
RBC / HPF: >50 RBC/hpf (ref 0–5)
Specific Gravity, Urine: 1.018 (ref 1.005–1.030)
pH: 6 (ref 5.0–8.0)

## 2022-02-04 MED ORDER — DIPHENHYDRAMINE HCL 50 MG/ML IJ SOLN
12.5000 mg | Freq: Once | INTRAMUSCULAR | Status: AC
  Administered 2022-02-04: 12.5 mg via INTRAVENOUS
  Filled 2022-02-04: qty 1

## 2022-02-04 MED ORDER — SODIUM CHLORIDE 0.9 % IV BOLUS
1000.0000 mL | Freq: Once | INTRAVENOUS | Status: AC
  Administered 2022-02-04: 1000 mL via INTRAVENOUS

## 2022-02-04 MED ORDER — ONDANSETRON 4 MG PO TBDP
4.0000 mg | ORAL_TABLET | Freq: Once | ORAL | Status: AC | PRN
  Administered 2022-02-04: 4 mg via ORAL
  Filled 2022-02-04: qty 1

## 2022-02-04 MED ORDER — KETOROLAC TROMETHAMINE 15 MG/ML IJ SOLN
15.0000 mg | Freq: Once | INTRAMUSCULAR | Status: AC
  Administered 2022-02-04: 15 mg via INTRAVENOUS
  Filled 2022-02-04: qty 1

## 2022-02-04 MED ORDER — OXYCODONE-ACETAMINOPHEN 5-325 MG PO TABS
1.0000 | ORAL_TABLET | ORAL | Status: DC | PRN
  Administered 2022-02-04: 1 via ORAL
  Filled 2022-02-04: qty 1

## 2022-02-04 MED ORDER — HYDROMORPHONE HCL 1 MG/ML IJ SOLN
1.0000 mg | Freq: Once | INTRAMUSCULAR | Status: AC
  Administered 2022-02-04: 1 mg via INTRAVENOUS
  Filled 2022-02-04: qty 1

## 2022-02-04 NOTE — ED Triage Notes (Signed)
Patient arrived with complaints of left sided flank pain that started today states he has a history of kidney stones.

## 2022-02-04 NOTE — ED Provider Notes (Signed)
[facility] Provider Note   CSN: [id number] Arrival date & time: 02/04/22  2159     History {Add pertinent medical, surgical, social history, OB history to HPI:1} Chief Complaint  Patient presents with   Flank Pain    Devin Sanders is a 42 y.o. male with history of nephrolithiasis requiring intervention in the past, admitted in September 2023 for ureteroscopic stone manipulation which the patient has required multiple times.  Presents today with left flank pain, nausea vomiting with NBNB emesis, no diarrhea.  Making amount of urine but states his urine is darker in color than normal.  No frank hematuria, no fevers or chills. In addition to bolus history is history of GERD.  No anticoagulation. HPI     Home Medications Prior to Admission medications   Medication Sig Start Date End Date Taking? Authorizing Provider  ketorolac (TORADOL) 10 MG tablet Take 1 tablet (10 mg total) by mouth every 8 (eight) hours as needed for moderate pain. Post-operatively 10/03/21   Alexis Frock, MD  Oxycodone HCl 10 MG TABS Take 1 tablet (10 mg total) by mouth every 6 (six) hours as needed. For breakthrough pain 10/03/21   Alexis Frock, MD  promethazine (PHENERGAN) 25 MG tablet Take 1 tablet (25 mg total) by mouth every 8 (eight) hours as needed for nausea or vomiting. 10/03/21   Alexis Frock, MD  THIOLA EC 300 MG TBEC Take 300 mg by mouth in the morning, at noon, and at bedtime. 08/30/21   [provider]      Allergies    Morphine and Other    Review of Systems   Review of Systems  Gastrointestinal:  Positive for abdominal pain, nausea and vomiting. Negative for abdominal distention, constipation and diarrhea.  Genitourinary:  Positive for flank pain. Negative for difficulty urinating, dysuria, genital sores, hematuria, penile discharge, penile pain, penile swelling, scrotal swelling, testicular pain and urgency.    Physical  Exam Updated Vital Signs BP (!) 185/111 (BP Location: Right Arm)   Pulse (!) 103   Temp 97.7 F (36.5 C) (Oral)   Resp 18   Ht 5\' 10"  (1.778 m)   Wt 99.8 kg   SpO2 98%   BMI 31.57 kg/m  Physical Exam Vitals and nursing note reviewed.  Constitutional:      Appearance: He is obese. He is not ill-appearing or toxic-appearing.  HENT:     Head: Normocephalic and atraumatic.     Nose: Nose normal.     Mouth/Throat:     Mouth: Mucous membranes are moist.     Pharynx: No oropharyngeal exudate or posterior oropharyngeal erythema.  Eyes:     General:        Right eye: No discharge.        Left eye: No discharge.     Extraocular Movements: Extraocular movements intact.     Conjunctiva/sclera: Conjunctivae normal.     Pupils: Pupils are equal, round, and reactive to light.  Cardiovascular:     Rate and Rhythm: Normal rate and regular rhythm.     Pulses: Normal pulses.     Heart sounds: Normal heart sounds. No murmur heard. Pulmonary:     Effort: Pulmonary effort is normal. No respiratory distress.     Breath sounds: Normal breath sounds. No wheezing or rales.  Abdominal:     General: Bowel sounds are normal. There is no distension.     Palpations: Abdomen is soft.     Tenderness:  There is abdominal tenderness in the left lower quadrant. There is no right CVA tenderness, left CVA tenderness or guarding.  Musculoskeletal:        General: No deformity.     Cervical back: Neck supple.     Right lower leg: No edema.     Left lower leg: No edema.  Skin:    General: Skin is warm and dry.     Capillary Refill: Capillary refill takes less than 2 seconds.  Neurological:     General: No focal deficit present.     Mental Status: He is alert and oriented to person, place, and time. Mental status is at baseline.  Psychiatric:        Mood and Affect: Mood normal.     ED Results / Procedures / Treatments   Labs (all labs ordered are listed, but only abnormal results are displayed) Labs  Reviewed  URINALYSIS, ROUTINE W REFLEX MICROSCOPIC - Abnormal; Notable for the following components:      Result Value   Hgb urine dipstick MODERATE (*)    All other components within normal limits    EKG None  Radiology No results found.  Procedures Procedures  {Document cardiac monitor, telemetry assessment procedure when appropriate:1}  Medications Ordered in ED Medications  oxyCODONE-acetaminophen (PERCOCET/ROXICET) 5-325 MG per tablet 1 tablet (1 tablet Oral Given 02/04/22 2223)  ondansetron (ZOFRAN-ODT) disintegrating tablet 4 mg (4 mg Oral Given 02/04/22 2223)    ED Course/ Medical Decision Making/ A&P   {   Click here for ABCD2, HEART and other calculatorsREFRESH Note before signing :1}                          Medical Decision Making 42 year old male who presents with left flank pain radiating to lower abdomen on the left, hx of nephrolithiasis.   HTN and tachycardia on intake, VS otherwise normal. Cardiopulmonary exam unremarkable, abdominal exam as above with LLQ TTP, no CVAT.  The differential diagnosis of emergent flank pain includes, but is not limited to: Nephrolithiasis/ Renal Colic, Pyelonephritis, Abdominal aortic aneurysm, Aortic dissection, Renal artery embolism, Renal vein thrombosis, Renal infarction, Renal hemorrhage, Mesenteric ischemia, Bladder tumor, Cystitis, Biliary colic, Pancreatitis, Perforated peptic ulcer,  Appendicitis, Inguinal Hernia, Diverticulitis, Bowel obstruction. Shingles, Lower lobe pneumonia, Retroperitoneal hematoma/abscess/tumor, Epidural abscess, Epidural hematoma. In males, it is important to consider Testicular torsion, Epididymitis, STD.   Amount and/or Complexity of Data Reviewed Labs: ordered.    Details: UA with hemoglobinuria.   Risk Prescription drug management.   ***  {Document critical care time when appropriate:1} {Document review of labs and clinical decision tools ie heart score, Chads2Vasc2 etc:1}  {Document  your independent review of radiology images, and any outside records:1} {Document your discussion with family members, caretakers, and with consultants:1} {Document social determinants of health affecting pt's care:1} {Document your decision making why or why not admission, treatments were needed:1} Final Clinical Impression(s) / ED Diagnoses Final diagnoses:  None    Rx / DC Orders ED Discharge Orders     None

## 2022-02-05 LAB — CBC WITH DIFFERENTIAL/PLATELET
Abs Immature Granulocytes: 0.01 10*3/uL (ref 0.00–0.07)
Basophils Absolute: 0 10*3/uL (ref 0.0–0.1)
Basophils Relative: 1 %
Eosinophils Absolute: 0.1 10*3/uL (ref 0.0–0.5)
Eosinophils Relative: 2 %
HCT: 40.9 % (ref 39.0–52.0)
Hemoglobin: 14 g/dL (ref 13.0–17.0)
Immature Granulocytes: 0 %
Lymphocytes Relative: 32 %
Lymphs Abs: 1.2 10*3/uL (ref 0.7–4.0)
MCH: 32.9 pg (ref 26.0–34.0)
MCHC: 34.2 g/dL (ref 30.0–36.0)
MCV: 96 fL (ref 80.0–100.0)
Monocytes Absolute: 0.6 10*3/uL (ref 0.1–1.0)
Monocytes Relative: 17 %
Neutro Abs: 1.8 10*3/uL (ref 1.7–7.7)
Neutrophils Relative %: 48 %
Platelets: 238 10*3/uL (ref 150–400)
RBC: 4.26 MIL/uL (ref 4.22–5.81)
RDW: 11.8 % (ref 11.5–15.5)
WBC: 3.7 10*3/uL — ABNORMAL LOW (ref 4.0–10.5)
nRBC: 0 % (ref 0.0–0.2)

## 2022-02-05 LAB — COMPREHENSIVE METABOLIC PANEL
ALT: 24 U/L (ref 0–44)
AST: 25 U/L (ref 15–41)
Albumin: 3.9 g/dL (ref 3.5–5.0)
Alkaline Phosphatase: 73 U/L (ref 38–126)
Anion gap: 9 (ref 5–15)
BUN: 14 mg/dL (ref 6–20)
CO2: 23 mmol/L (ref 22–32)
Calcium: 9 mg/dL (ref 8.9–10.3)
Chloride: 106 mmol/L (ref 98–111)
Creatinine, Ser: 1.03 mg/dL (ref 0.61–1.24)
GFR, Estimated: >60 mL/min (ref >60)
Glucose, Bld: 102 mg/dL — ABNORMAL HIGH (ref 70–99)
Potassium: 3.6 mmol/L (ref 3.5–5.1)
Sodium: 138 mmol/L (ref 135–145)
Total Bilirubin: 0.6 mg/dL (ref 0.3–1.2)
Total Protein: 7 g/dL (ref 6.5–8.1)

## 2022-02-05 LAB — LIPASE, BLOOD: Lipase: 36 U/L (ref 11–51)

## 2022-02-05 MED ORDER — ONDANSETRON 4 MG PO TBDP: 4.0000 mg | ORAL_TABLET | Freq: Three times a day (TID) | ORAL | 0 refills | Status: DC | PRN

## 2022-02-05 MED ORDER — HYDROMORPHONE HCL 1 MG/ML IJ SOLN
1.0000 mg | Freq: Once | INTRAMUSCULAR | Status: AC
  Administered 2022-02-05: 1 mg via INTRAVENOUS
  Filled 2022-02-05: qty 1

## 2022-02-05 MED ORDER — OXYCODONE-ACETAMINOPHEN 5-325 MG PO TABS: 1.0000 | ORAL_TABLET | Freq: Four times a day (QID) | ORAL | 0 refills | Status: DC | PRN

## 2022-02-05 MED ORDER — TAMSULOSIN HCL 0.4 MG PO CAPS: 0.4000 mg | ORAL_CAPSULE | Freq: Every day | ORAL | 0 refills | Status: DC

## 2022-02-05 NOTE — Discharge Instructions (Signed)
You were seen in the ER today for your flank and abdominal pain. While your CT scan did not show any large kidney stones, your clinical picture is suggestive of possible kidney stones again today. You may take the prescribed pain medication and should follow up with your primary urologist within the next week. Return to the ER if you develop any fevers, nausea or vomiting that does not stop, intractable pain, or any other new severe symptoms.

## 2022-02-07 ENCOUNTER — Other Ambulatory Visit: Payer: Self-pay | Admitting: Urology

## 2022-02-07 NOTE — Patient Instructions (Signed)
SURGICAL WAITING ROOM VISITATION Patients having surgery or a procedure may have no more than 2 support people in the waiting area - these visitors may rotate.    If the patient needs to stay at the hospital during part of their recovery, the visitor guidelines for inpatient rooms apply. Pre-op nurse will coordinate an appropriate time for 1 support person to accompany patient in pre-op.  This support person may not rotate.    Please refer to the [facility] website for the visitor guidelines for Inpatients (after your surgery is over and you are in a regular room).   Due to an increase in RSV and influenza rates and associated hospitalizations, children ages 3 and under may not visit patients in [place].     Your procedure is scheduled on: 02-12-22   Report to [facility] Main Entrance    Report to admitting at 2:30 PM   Call this number if you have problems the morning of surgery [phone]   Do not eat food or drink liquids :After Midnight.          If you have questions, please contact your surgeon's office.   FOLLOW  ANY ADDITIONAL PRE OP INSTRUCTIONS YOU RECEIVED FROM YOUR SURGEON'S OFFICE!!!     Oral Hygiene is also important to reduce your risk of infection.                                    Remember - BRUSH YOUR TEETH THE MORNING OF SURGERY WITH YOUR REGULAR TOOTHPASTE   Do NOT smoke after Midnight   Take these medicines the morning of surgery with A SIP OF WATER:  Oxycodone Ondansetron if needed Tamsulosin if needed                              You may not have any metal on your body including  jewelry, and body piercing             Do not wear  lotions, powders, cologne, or deodorant              Men may shave face and neck.   Do not bring valuables to the hospital. [place].   Contacts, dentures or bridgework may not be worn into surgery.  DO NOT [place]. PHARMACY WILL DISPENSE MEDICATIONS LISTED ON YOUR MEDICATION LIST TO YOU DURING YOUR ADMISSION [place]!    Patients discharged on the day of surgery will not be allowed to drive home.  Someone NEEDS to stay with you for the first 24 hours after anesthesia.   Special Instructions: Bring a copy of your healthcare power of attorney and living will documents the day of surgery if you haven't scanned them before.              Please read over the following fact sheets you were given: IF [place]  If you received a COVID test during your pre-op visit  it is requested that you wear a mask when out in public, stay away from anyone that may not be feeling well and notify your surgeon if you develop symptoms. If you test positive for Covid or have been in contact with anyone that has tested positive  in the last 10 days please notify you surgeon.  [facility] - Preparing for Surgery Before surgery, you can play an important role.  Because skin is not sterile, your skin needs to be as free of germs as possible.  You can reduce the number of germs on your skin by washing with CHG (chlorahexidine gluconate) soap before surgery.  CHG is an antiseptic cleaner which kills germs and bonds with the skin to continue killing germs even after washing. Please DO NOT use if you have an allergy to CHG or antibacterial soaps.  If your skin becomes reddened/irritated stop using the CHG and inform your nurse when you arrive at Short Stay. Do not shave (including legs and underarms) for at least 48 hours prior to the first CHG shower.  You may shave your face/neck.  Please follow these instructions carefully:  1.  Shower with CHG Soap the night before surgery and the  morning of surgery.  2.  If you choose to wash your hair, wash your hair first as usual with your normal  shampoo.  3.  After you shampoo, rinse your hair and body  thoroughly to remove the shampoo.                             4.  Use CHG as you would any other liquid soap.  You can apply chg directly to the skin and wash.  Gently with a scrungie or clean washcloth.  5.  Apply the CHG Soap to your body ONLY FROM THE NECK DOWN.   Do   not use on face/ open                           Wound or open sores. Avoid contact with eyes, ears mouth and   genitals (private parts).                       Wash face,  Genitals (private parts) with your normal soap.             6.  Wash thoroughly, paying special attention to the area where your    surgery  will be performed.  7.  Thoroughly rinse your body with warm water from the neck down.  8.  DO NOT shower/wash with your normal soap after using and rinsing off the CHG Soap.                9.  Pat yourself dry with a clean towel.            10.  Wear clean pajamas.            11.  Place clean sheets on your bed the night of your first shower and do not  sleep with pets. Day of Surgery : Do not apply any lotions/deodorants the morning of surgery.  Please wear clean clothes to the hospital/surgery center.  FAILURE TO FOLLOW THESE INSTRUCTIONS MAY RESULT IN THE CANCELLATION OF YOUR SURGERY  PATIENT SIGNATURE_________________________________  NURSE SIGNATURE__________________________________  ________________________________________________________________________

## 2022-02-10 ENCOUNTER — Other Ambulatory Visit: Payer: Self-pay

## 2022-02-10 ENCOUNTER — Encounter (HOSPITAL_COMMUNITY)
Admission: RE | Admit: 2022-02-10 | Discharge: 2022-02-10 | Disposition: A | Discharge status: Home / Self Care | Payer: Commercial Managed Care - PPO | Source: Ambulatory Visit | Attending: Urology | Admitting: Urology

## 2022-02-10 ENCOUNTER — Encounter (HOSPITAL_COMMUNITY): Payer: Self-pay

## 2022-02-10 NOTE — Progress Notes (Signed)
COVID Vaccine Completed:  Yes  Date of COVID positive in last 90 days:  No  PCP - [facility] in [place] Cardiologist - N/A  Chest x-ray -  N/A EKG -  N/A Stress Test -  N/A ECHO -  N/A Cardiac Cath -  N/A Pacemaker/ICD device last checked: Spinal Cord Stimulator: N/A  Bowel Prep -  N/A  Sleep Study -  N/A CPAP -   Fasting Blood Sugar -  N/A Checks Blood Sugar _____ times a day  Last dose of GLP1 agonist-  N/A GLP1 instructions:  N/A   Last dose of SGLT-2 inhibitors-  N/A SGLT-2 instructions: N/A  Blood Thinner Instructions: N/A Aspirin Instructions: Last Dose:  Activity level:  Can go up a flight of stairs and perform activities of daily living without stopping and without symptoms of chest pain or shortness of breath.  Able to exercise without symptoms  Anesthesia review:  N/A  Patient denies shortness of breath, fever, cough and chest pain at PAT appointment  Patient verbalized understanding of instructions that were given to them at the PAT appointment. Patient was also instructed that they will need to review over the PAT instructions again at home before surgery.

## 2022-02-12 ENCOUNTER — Ambulatory Visit (HOSPITAL_COMMUNITY): Payer: Commercial Managed Care - PPO | Admitting: Certified Registered"

## 2022-02-12 ENCOUNTER — Encounter (HOSPITAL_COMMUNITY): Payer: Self-pay | Admitting: Urology

## 2022-02-12 ENCOUNTER — Ambulatory Visit (HOSPITAL_COMMUNITY): Payer: Commercial Managed Care - PPO

## 2022-02-12 ENCOUNTER — Encounter (HOSPITAL_COMMUNITY)
Admission: RE | Disposition: A | Discharge status: Home / Self Care | Payer: Self-pay | Source: Home / Self Care | Attending: Urology

## 2022-02-12 ENCOUNTER — Ambulatory Visit (HOSPITAL_BASED_OUTPATIENT_CLINIC_OR_DEPARTMENT_OTHER): Payer: Commercial Managed Care - PPO | Admitting: Certified Registered"

## 2022-02-12 ENCOUNTER — Other Ambulatory Visit: Payer: Self-pay

## 2022-02-12 ENCOUNTER — Ambulatory Visit (HOSPITAL_COMMUNITY)
Admission: RE | Admit: 2022-02-12 | Discharge: 2022-02-12 | Disposition: A | Discharge status: Home / Self Care | Payer: Commercial Managed Care - PPO | Attending: Urology | Admitting: Urology

## 2022-02-12 DIAGNOSIS — E669 Obesity, unspecified: Secondary | ICD-10-CM | POA: Insufficient documentation

## 2022-02-12 DIAGNOSIS — E7201 Cystinuria: Secondary | ICD-10-CM | POA: Diagnosis not present

## 2022-02-12 DIAGNOSIS — N200 Calculus of kidney: Secondary | ICD-10-CM

## 2022-02-12 HISTORY — PX: HOLMIUM LASER APPLICATION: SHX5852

## 2022-02-12 HISTORY — PX: CYSTOSCOPY WITH RETROGRADE PYELOGRAM, URETEROSCOPY AND STENT PLACEMENT: SHX5789

## 2022-02-12 SURGERY — CYSTOURETEROSCOPY, WITH RETROGRADE PYELOGRAM AND STENT INSERTION
Anesthesia: General | Site: Urethra | Laterality: Bilateral

## 2022-02-12 MED ORDER — DEXAMETHASONE SODIUM PHOSPHATE 10 MG/ML IJ SOLN
INTRAMUSCULAR | Status: AC
  Filled 2022-02-12: qty 1

## 2022-02-12 MED ORDER — OXYCODONE HCL 5 MG PO TABS
ORAL_TABLET | ORAL | Status: AC
  Filled 2022-02-12: qty 1

## 2022-02-12 MED ORDER — ONDANSETRON HCL 4 MG/2ML IJ SOLN
INTRAMUSCULAR | Status: AC
  Filled 2022-02-12: qty 2

## 2022-02-12 MED ORDER — OXYCODONE HCL 5 MG/5ML PO SOLN: 5.0000 mg | Freq: Once | ORAL | Status: AC | PRN

## 2022-02-12 MED ORDER — GENTAMICIN SULFATE 40 MG/ML IJ SOLN
480.0000 mg | INTRAVENOUS | Status: AC
  Administered 2022-02-12: 480 mg via INTRAVENOUS
  Filled 2022-02-12: qty 12

## 2022-02-12 MED ORDER — MIDAZOLAM HCL 2 MG/2ML IJ SOLN
INTRAMUSCULAR | Status: DC | PRN
  Administered 2022-02-12: 2 mg via INTRAVENOUS

## 2022-02-12 MED ORDER — CHLORHEXIDINE GLUCONATE 0.12 % MT SOLN
15.0000 mL | Freq: Once | OROMUCOSAL | Status: AC
  Administered 2022-02-12: 15 mL via OROMUCOSAL

## 2022-02-12 MED ORDER — PROPOFOL 10 MG/ML IV BOLUS
INTRAVENOUS | Status: AC
  Filled 2022-02-12: qty 20

## 2022-02-12 MED ORDER — DIPHENHYDRAMINE HCL 50 MG/ML IJ SOLN
INTRAMUSCULAR | Status: AC
  Filled 2022-02-12: qty 1

## 2022-02-12 MED ORDER — IOHEXOL 300 MG/ML  SOLN
INTRAMUSCULAR | Status: DC | PRN
  Administered 2022-02-12: 30 mL

## 2022-02-12 MED ORDER — OXYCODONE HCL 5 MG PO TABS
5.0000 mg | ORAL_TABLET | Freq: Once | ORAL | Status: AC | PRN
  Administered 2022-02-12: 5 mg via ORAL

## 2022-02-12 MED ORDER — LACTATED RINGERS IV SOLN: INTRAVENOUS | Status: DC

## 2022-02-12 MED ORDER — FENTANYL CITRATE (PF) 100 MCG/2ML IJ SOLN
INTRAMUSCULAR | Status: AC
  Filled 2022-02-12: qty 2

## 2022-02-12 MED ORDER — AMISULPRIDE (ANTIEMETIC) 5 MG/2ML IV SOLN: 10.0000 mg | Freq: Once | INTRAVENOUS | Status: DC | PRN

## 2022-02-12 MED ORDER — PROPOFOL 10 MG/ML IV BOLUS
INTRAVENOUS | Status: DC | PRN
  Administered 2022-02-12: 200 mg via INTRAVENOUS

## 2022-02-12 MED ORDER — DIPHENHYDRAMINE HCL 50 MG/ML IJ SOLN
12.5000 mg | Freq: Once | INTRAMUSCULAR | Status: AC
  Administered 2022-02-12: 12.5 mg via INTRAVENOUS

## 2022-02-12 MED ORDER — DEXAMETHASONE SODIUM PHOSPHATE 10 MG/ML IJ SOLN
INTRAMUSCULAR | Status: DC | PRN
  Administered 2022-02-12: 4 mg via INTRAVENOUS

## 2022-02-12 MED ORDER — ONDANSETRON HCL 4 MG/2ML IJ SOLN
INTRAMUSCULAR | Status: DC | PRN
  Administered 2022-02-12: 4 mg via INTRAVENOUS

## 2022-02-12 MED ORDER — HYDROMORPHONE HCL 1 MG/ML IJ SOLN
INTRAMUSCULAR | Status: AC
  Administered 2022-02-12: 0.5 mg via INTRAVENOUS
  Filled 2022-02-12: qty 1

## 2022-02-12 MED ORDER — ORAL CARE MOUTH RINSE: 15.0000 mL | Freq: Once | OROMUCOSAL | Status: AC

## 2022-02-12 MED ORDER — PROMETHAZINE HCL 25 MG/ML IJ SOLN: 6.2500 mg | INTRAMUSCULAR | Status: DC | PRN

## 2022-02-12 MED ORDER — LIDOCAINE 2% (20 MG/ML) 5 ML SYRINGE
INTRAMUSCULAR | Status: DC | PRN
  Administered 2022-02-12: 60 mg via INTRAVENOUS

## 2022-02-12 MED ORDER — HYDROMORPHONE HCL 1 MG/ML IJ SOLN
0.2500 mg | INTRAMUSCULAR | Status: DC | PRN
  Administered 2022-02-12: 0.5 mg via INTRAVENOUS

## 2022-02-12 MED ORDER — MIDAZOLAM HCL 2 MG/2ML IJ SOLN
INTRAMUSCULAR | Status: AC
  Filled 2022-02-12: qty 2

## 2022-02-12 MED ORDER — SODIUM CHLORIDE 0.9 % IR SOLN
Status: DC | PRN
  Administered 2022-02-12: 3000 mL via INTRAVESICAL

## 2022-02-12 SURGICAL SUPPLY — 25 items
BAG URO CATCHER STRL LF (MISCELLANEOUS) ×2 IMPLANT
BASKET LASER NITINOL 1.9FR (BASKET) IMPLANT
BSKT STON RTRVL 120 1.9FR (BASKET)
CATH URETL OPEN END 6FR 70 (CATHETERS) ×2 IMPLANT
CLOTH BEACON ORANGE TIMEOUT ST (SAFETY) ×2 IMPLANT
EXTRACTOR STONE 1.7FRX115CM (UROLOGICAL SUPPLIES) IMPLANT
GLOVE SURG LX STRL 7.5 STRW (GLOVE) ×2 IMPLANT
GOWN STRL REUS W/ TWL XL LVL3 (GOWN DISPOSABLE) ×2 IMPLANT
GOWN STRL REUS W/TWL XL LVL3 (GOWN DISPOSABLE) ×2
GUIDEWIRE ANG ZIPWIRE 038X150 (WIRE) ×2 IMPLANT
GUIDEWIRE STR DUAL SENSOR (WIRE) ×2 IMPLANT
KIT TURNOVER KIT A (KITS) IMPLANT
LASER FIB FLEXIVA PULSE ID 365 (Laser) IMPLANT
LASER FIB FLEXIVA PULSE ID 550 (Laser) IMPLANT
LASER FIB FLEXIVA PULSE ID 910 (Laser) IMPLANT
MANIFOLD NEPTUNE II (INSTRUMENTS) ×2 IMPLANT
PACK CYSTO (CUSTOM PROCEDURE TRAY) ×2 IMPLANT
SHEATH NAVIGATOR HD 11/13X28 (SHEATH) IMPLANT
SHEATH NAVIGATOR HD 11/13X36 (SHEATH) IMPLANT
STENT POLARIS 5FRX26 (STENTS) IMPLANT
TRACTIP FLEXIVA PULS ID 200XHI (Laser) IMPLANT
TRACTIP FLEXIVA PULSE ID 200 (Laser) ×2
TUBE PU 8FR 16IN ENFIT (TUBING) ×2 IMPLANT
TUBING CONNECTING 10 (TUBING) ×2 IMPLANT
TUBING UROLOGY SET (TUBING) ×2 IMPLANT

## 2022-02-12 NOTE — Op Note (Signed)
NAME: Devin Sanders, Devin Sanders MEDICAL RECORD NO: [id number] ACCOUNT NO: [id number] DATE OF BIRTH: 05-01-1980 FACILITY: [facility] LOCATION: [facility] PHYSICIAN: Alexis Frock, MD  Operative Report   SURGEON:  Alexis Frock, MD  PREOPERATIVE DIAGNOSES:  Right greater than left recurrent urolithiasis, history of cystinuria.  PROCEDURE PERFORMED: 1.  Cystoscopy with bilateral retrograde pyelograms, interpretation. 2.  Bilateral ureteroscopy with laser lithotripsy. 3.  Insertion of bilateral ureteral stents.  ESTIMATED BLOOD LOSS:  Nil.  COMPLICATIONS:  None.  SPECIMEN:  Bilateral renal stone fragments given to patient.  FINDINGS:  Right greater than left renal stone, one left renal stone likely ball-valving, intermittent obstruction.  INDICATIONS:  The patient is a very pleasant and unfortunate 42 year old man with a lifelong history of cystinuria.  He is status post innumerable surgical procedures related to this.  He is compliant with medical therapy.  He was found on workup of  colicky flank pain to have a right greater than left intrarenal stones by ER imaging. He had no obvious hydronephrosis at that time. Patient is known to get flank pain even with nonobstructing stones as they sometimes are ball-valving with him. Options  were discussed for management including observation versus medical therapy versus bilateral ureteroscopy with cleanout and he wished to do the latter with goal of stone free and to prevent progression of known stones, especially to the point of requiring  major surgery, which could damage his overall renal function.  He presents for bilateral ureteroscopy today.  Informed consent was obtained and placed in medical record.  PROCEDURE IN DETAIL:  The patient being Devin Sanders verified and procedure being bilateral ureteroscopic stone manipulation was confirmed.  Procedure timeout was performed.  Intravenous antibiotics were administered.  General anesthesia was induced.    The patient was placed into a low lithotomy position.  Sterile field was created, prepped and draped the patient's penis, perineum, and proximal thighs using iodine.  Cystourethroscopy was performed using 21-French rigid cystoscope with offset lens.   Inspection of anterior and posterior urethra unremarkable.  Inspection of the urinary bladder revealed no diverticula, calcifications or papillary lesions.  Ureteral orifices were single bilaterally.  The left ureteral orifice was cannulated with a  6-French end-hole catheter, and a left retrograde pyelogram was obtained.  Left retrograde pyelogram demonstrated single left ureter, single system left kidney.  No obvious filling defects or narrowing noted.  A 0.038 ZIPwire was advanced to the level of the upper pole, set aside as a safety wire.  Next, right retrograde  pyelogram was obtained.  Right retrograde pyelogram demonstrated single right ureter, single system right kidney.  There was a calcification in the lower pole consistent with known stone.  No hydronephrosis noted.  A separate 0.038 ZIPwire was advanced to the level of the upper pole,  set aside as a safety wire.  An 8-French feeding tube placed in the urinary bladder for pressure release.  Next, semirigid ureteroscopy was performed to distal two-thirds of the right ureter alongside a separate sensor working wire.  No mucosal  abnormalities were found.  Next, semirigid ureteroscopy performed of the distal two-thirds left ureter alongside a separate sensor working wire.  No mucosal abnormalities were found.  The semirigid scope was exchanged for a medium length ureteral access  sheath using continuous fluoroscopic guidance to the level of proximal ureter and flexible digital ureteroscopy was performed of the proximal left ureter and systematic inspection of left kidney including all calices x3.  There was one free floating  stone in upper pole  estimated to be approximately 4 mm.  There was  another area of papillary tip calcifications on the upper mid calix and lower calix.  Holmium laser energy was applied to the papillary tip calcifications using settings of 0.2 joules and  40 Hz and this was completely ablated.  The free floating upper pole stone was amenable to simple basketing.  It was grasped with Escape basket and removed in its entirety, set aside to be given to the patient.  Repeat panendoscopic examination of the  left kidney, including all calices revealed no additional stone fragments. Access sheath was removed under continuous vision, no significant mucosal abnormalities were found.  Next, the access sheath was placed over the right sensor working wire to the  level of proximal right ureter using continuous fluoroscopic guidance and flexible digital ureteroscopy was performed of the proximal right ureter and systematic inspection of the right kidney, including all calices x3.  There was one area of papillary  tip calcification in upper pole estimated to be approximately 3-4 mm and in the area of the lower pole free floating stone, there was a much larger, approximately 1 cm.  The larger stone was grasped and repositioned into the upper pole to allow for less  acute angulation and holmium laser energy was applied to stone using settings of 0.2 joules and 40 Hz and approximately 50% of the stone was dusted, 50% fragmented with the fragments being amenable to simple basketing with Escape basket.  Following this,  complete resolution of all accessible stone fragments larger than one-third mm.  Excellent hemostasis, no evidence of perforation.  Access sheath was removed under continuous vision. No significant mucosal abnormalities were found.  Given the bilateral  nature of the surgery today, it was felt that most prudent means of management would be brief interval stenting with tethered stent.  As such, new 5 x 26 Polaris type stents were placed over remaining safety wires using  fluoroscopic guidance.  Good  proximal and distal planes were noted.  Tether was left in place, fashioned together, trimmed to length and taped on the dorsum of penis.  The procedure was terminated.  The patient tolerated the procedure well, no immediate periprocedural complications.   The patient was taken to postanesthesia care unit in stable condition.  Plan for discharge home.   NIK D: 02/12/2022 6:09:09 pm T: 02/12/2022 9:26:00 pm  JOB: [id number]

## 2022-02-12 NOTE — Anesthesia Postprocedure Evaluation (Signed)
Anesthesia Post Note  Patient: Devin Sanders  Procedure(s) Performed: CYSTOSCOPY WITH RETROGRADE PYELOGRAM, URETEROSCOPY AND STENT PLACEMENT (Bilateral: Urethra) HOLMIUM LASER APPLICATION (Bilateral: Ureter)     Patient location during evaluation: PACU Anesthesia Type: General Level of consciousness: awake and alert Pain management: pain level controlled Vital Signs Assessment: post-procedure vital signs reviewed and stable Respiratory status: spontaneous breathing, nonlabored ventilation and respiratory function stable Cardiovascular status: blood pressure returned to baseline and stable Postop Assessment: no apparent nausea or vomiting Anesthetic complications: no   No notable events documented.  Last Vitals:  Vitals:   02/12/22 1851 02/12/22 1900  BP: 121/83   Pulse: 77 84  Resp: 20 16  Temp: (!) 36.4 C   SpO2: 94% 93%    Last Pain:  Vitals:   02/12/22 1851  TempSrc: Oral  PainSc: 0-No pain                 Lynda Rainwater

## 2022-02-12 NOTE — Transfer of Care (Signed)
Immediate Anesthesia Transfer of Care Note  Patient: Devin Sanders  Procedure(s) Performed: CYSTOSCOPY WITH RETROGRADE PYELOGRAM, URETEROSCOPY AND STENT PLACEMENT (Bilateral: Urethra) HOLMIUM LASER APPLICATION (Bilateral: Ureter)  Patient Location: PACU  Anesthesia Type:General  Level of Consciousness: drowsy  Airway & Oxygen Therapy: Patient Spontanous Breathing and Patient connected to face mask oxygen  Post-op Assessment: Report given to RN, Post -op Vital signs reviewed and stable, and Patient moving all extremities X 4  Post vital signs: Reviewed and stable  Last Vitals:  Vitals Value Taken Time  BP 134/92   Temp    Pulse 76 02/12/22 1812  Resp 12   SpO2 98 % 02/12/22 1812  Vitals shown include unvalidated device data.  Last Pain:  Vitals:   02/12/22 1501  TempSrc:   PainSc: 8          Complications: No notable events documented.

## 2022-02-12 NOTE — Anesthesia Procedure Notes (Signed)
Procedure Name: LMA Insertion Date/Time: 02/12/2022 5:09 PM  Performed by: Niel Hummer, CRNAPre-anesthesia Checklist: Patient identified, Emergency Drugs available, Suction available and Patient being monitored Patient Re-evaluated:Patient Re-evaluated prior to induction Oxygen Delivery Method: Circle system utilized Preoxygenation: Pre-oxygenation with 100% oxygen Induction Type: IV induction LMA: LMA inserted LMA Size: 4.0 Number of attempts: 1 Dental Injury: Teeth and Oropharynx as per pre-operative assessment

## 2022-02-12 NOTE — Discharge Instructions (Addendum)
1 - You may have urinary urgency (bladder spasms) and bloody urine on / off with stent in place. This is normal.  2 - Remove tethered stents on Friday morning at home. Office is open Friday if any issues arise. There are TWO stents.   3 - Call MD or go to ER for fever >102, severe pain / nausea / vomiting not relieved by medications, or acute change in medical status

## 2022-02-12 NOTE — H&P (Signed)
Devin Sanders is an 42 y.o. male.    Chief Complaint:  Pre-OP BILATERAL Ureteroscopic Stone Manipulation  HPI:   1 - Recurrent Nephrolithiasis -  Pre 2019 - PCNL several each side, URS x innumerable each side at [facility].   Recent Course -  01/2019 -bilateral stage urs to stone free; 05/2019 - bilateral URS to stone free; 09/2019 [place] - 1cm RUP, 1cm RLP, 1.4cm LUP no hydro  08/2020 - Rt URS for RLP 1cm, Rt mid 37mm, punctate Lt renal not adressed; 10/2020 - Bilateral URS, punctate stones only to stone free  02/2021 - Bilateral URS to stone free + incision of left infundibular stenossi upper mid / non tethered stents  09/2021 - Bilaterl URS to stone free (punctate pap tip calcs only)   Recent Surveillance:  05/2021 - KUB,RUS - bilatearl punctate pap tip calcs only, no hydro.  12/2021 - KUB, RUS - bilateral punctate pap tip calcs only (L>R), no hydro  02/2022 - ER CT - R>L pap tip calcs (abtou 38mm total R, 28mm total L), no hydro  2 - Cysteinuria - ON thiola 900 QD / day PLUS K-Cit 2000MEQ BID meals for cysteinuria.   3 - Desire for Male Sterilization - pt with 2 healthy children, 2 and 11yo. s/p OR vasectomy at time of ureteroscopy 09/2021. Post-vas sample 12/2021 with azospermia, cleared for unprotected intercourse.   PMH sig for mild obesity. 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 His PCP is Morrie Sheldon MD.   Today "Devin Sanders" is seen to proceed with BILATERAL ureteroscopic stone manipulation for bilateral recurrent renal stones and occasional colic. Most recent UCX negative. No interval fevers.   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)   GERD (gastroesophageal reflux disease)    History of kidney stones    Left ureteral calculus    Renal calculus, left    Wears glasses     Past Surgical History:  Procedure Laterality Date   CYSTOSCOPY WITH  RETROGRADE PYELOGRAM, URETEROSCOPY AND STENT PLACEMENT Bilateral 06/26/2017   Procedure: CYSTOSCOPY WITH RETROGRADE PYELOGRAM, URETEROSCOPY, STONE BASKETRY  AND STENT PLACEMENT;  Surgeon: Alexis Frock, MD;  Location: [facility];  Service: Urology;  Laterality: Bilateral;   CYSTOSCOPY WITH RETROGRADE PYELOGRAM, URETEROSCOPY AND STENT PLACEMENT Right 09/18/2017   Procedure: CYSTOSCOPY WITH RETROGRADE PYELOGRAM, URETEROSCOPY AND STENT PLACEMENT;  Surgeon: Ardis Hughs, MD;  Location: [facility];  Service: Urology;  Laterality: Right;   CYSTOSCOPY WITH RETROGRADE PYELOGRAM, URETEROSCOPY AND STENT PLACEMENT Bilateral 10/13/2017   Procedure: CYSTOSCOPY WITH RETROGRADE PYELOGRAM, BILATERAL URETEROSCOPY AND BILATERAL STENT PLACEMENT, LASER;  Surgeon: Alexis Frock, MD;  Location: [facility];  Service: Urology;  Laterality: Bilateral;  19 MINS   CYSTOSCOPY WITH RETROGRADE PYELOGRAM, URETEROSCOPY AND STENT PLACEMENT Bilateral 03/24/2018   Procedure: CYSTOSCOPY WITH RETROGRADE PYELOGRAM, URETEROSCOPY AND STENT PLACEMENT;  Surgeon: Alexis Frock, MD;  Location: [facility];  Service: Urology;  Laterality: Bilateral;  1 HR   CYSTOSCOPY WITH RETROGRADE PYELOGRAM, URETEROSCOPY AND STENT PLACEMENT Bilateral 07/07/2018   Procedure: CYSTOSCOPY WITH RETROGRADE PYELOGRAM, URETEROSCOPY AND STENT PLACEMENT;  Surgeon: Alexis Frock, MD;  Location: [facility];  Service: Urology;  Laterality: Bilateral;  63 MINUTES   CYSTOSCOPY WITH RETROGRADE PYELOGRAM, URETEROSCOPY AND STENT PLACEMENT Left 01/12/2019   Procedure: CYSTOSCOPY WITH RETROGRADE PYELOGRAM, URETEROSCOPY AND STENT PLACEMENT;  Surgeon: Alexis Frock, MD;  Location: [facility];  Service: Urology;  Laterality: Left;  90 MINS   CYSTOSCOPY WITH RETROGRADE PYELOGRAM, URETEROSCOPY AND STENT PLACEMENT Left 01/28/2019   Procedure: CYSTOSCOPY WITH RETROGRADE PYELOGRAM, URETEROSCOPY AND STENT EXCHANGE STONE BASKET EXTRACTION ;  Surgeon: Alexis Frock, MD;  Location:  [facility];  Service: Urology;  Laterality: Left;   CYSTOSCOPY WITH RETROGRADE PYELOGRAM, URETEROSCOPY AND STENT PLACEMENT Bilateral 05/13/2019   Procedure: CYSTOSCOPY WITH BILATERAL  RETROGRADE PYELOGRAM, LEFT URETEROSCOPY WITH HOLMIUM LASER AND STENT PLACEMENT;  Surgeon: Alexis Frock, MD;  Location[facility];  Service: Urology;  Laterality: Bilateral;   CYSTOSCOPY WITH RETROGRADE PYELOGRAM, URETEROSCOPY AND STENT PLACEMENT Bilateral 11/02/2019   Procedure: CYSTOSCOPY WITH RETROGRADE PYELOGRAM, URETEROSCOPY AND STENT PLACEMENT;  Surgeon: Alexis Frock, MD;  Location: [facility];  Service: Urology;  Laterality: Bilateral;  6 MINS   CYSTOSCOPY WITH RETROGRADE PYELOGRAM, URETEROSCOPY AND STENT PLACEMENT Bilateral 10/12/2020   Procedure: CYSTOSCOPY WITH RETROGRADE PYELOGRAM, URETEROSCOPY, BASKETING OF STONES AND BILATERAL STENT PLACEMENTS;  Surgeon: Alexis Frock, MD;  Location: [facility];  Service: Urology;  Laterality: Bilateral;  23 MINS   CYSTOSCOPY WITH RETROGRADE PYELOGRAM, URETEROSCOPY AND STENT PLACEMENT Bilateral 02/15/2021   Procedure: CYSTOSCOPY WITH RETROGRADE PYELOGRAM, URETEROSCOPY AND STENT PLACEMENT;  Surgeon: Alexis Frock, MD;  Location[facility];  Service: Urology;  Laterality: Bilateral;  75 MINS   CYSTOSCOPY WITH RETROGRADE PYELOGRAM, URETEROSCOPY AND STENT PLACEMENT Bilateral 09/27/2021   Procedure: CYSTOSCOPY WITH RETROGRADE PYELOGRAM, URETEROSCOPY AND STENT PLACEMENT, VASECTOMY;  Surgeon: Alexis Frock, MD;  Location[facility];  Service: Urology;  Laterality: Bilateral;  75 MINS   CYSTOSCOPY/RETROGRADE/URETEROSCOPY/STONE EXTRACTION WITH BASKET Left 10/05/2017   Procedure: CYSTOSCOPY/RETROGRADE/STONE REMOVAL FROM BLADDER ;  Surgeon: Irine Seal, MD;  Location[facility];  Service: Urology;  Laterality: Left;   CYSTOSCOPY/URETEROSCOPY/HOLMIUM LASER/STENT PLACEMENT Right 09/28/2017   Procedure: CYSTOSCOPY/RIGHT RETROGRADE URETEROSCOPY/HOLMIUM LASER/ RIGHT  STENT PLACEMENT;  Surgeon: Ceasar Mons, MD;  Location: [facility];  Service: Urology;  Laterality: Right;   CYSTOSCOPY/URETEROSCOPY/HOLMIUM LASER/STENT PLACEMENT Bilateral 01/27/2018   Procedure: CYSTOSCOPY/URETEROSCOPY/HOLMIUM LASER/STENT PLACEMENT;  Surgeon: Alexis Frock, MD;  Location: [facility];  Service: Urology;  Laterality: Bilateral;   CYSTOSCOPY/URETEROSCOPY/HOLMIUM LASER/STENT PLACEMENT Right 08/08/2020   Procedure: CYSTOSCOPY/URETEROSCOPY/RETROGRADE/ HOLMIUM LASER/STENT PLACEMENT;  Surgeon: Franchot Gallo, MD;  Location[facility];  Service: Urology;  Laterality: Right;   HOLMIUM LASER APPLICATION Bilateral 04/29/5359   Procedure: HOLMIUM LASER APPLICATION;  Surgeon: Alexis Frock, MD;  Location: [facility];  Service: Urology;  Laterality: Bilateral;   HOLMIUM LASER APPLICATION Right 4/43/1540   Procedure: HOLMIUM LASER APPLICATION;  Surgeon: Ardis Hughs, MD;  Location[facility];  Service: Urology;  Laterality: Right;   HOLMIUM LASER APPLICATION Bilateral 08/11/7617   Procedure: HOLMIUM LASER APPLICATION;  Surgeon: Alexis Frock, MD;  Location[facility];  Service: Urology;  Laterality: Bilateral;   HOLMIUM LASER APPLICATION Bilateral 05/14/3265   Procedure: HOLMIUM LASER APPLICATION;  Surgeon: Alexis Frock, MD;  Location: [facility];  Service: Urology;  Laterality: Bilateral;   HOLMIUM LASER APPLICATION Bilateral 01/30/5807   Procedure: HOLMIUM LASER APPLICATION;  Surgeon: Alexis Frock, MD;  Location[facility];  Service: Urology;  Laterality: Bilateral;   HOLMIUM LASER APPLICATION Bilateral 09/13/3380   Procedure: HOLMIUM LASER APPLICATION;  Surgeon: Alexis Frock, MD;  Location: [facility];  Service: Urology;  Laterality: Bilateral;   HOLMIUM LASER APPLICATION Left 5/0/5397   Procedure: HOLMIUM LASER APPLICATION;  Surgeon: Alexis Frock, MD;  Location[facility];  Service: Urology;  Laterality: Left;   HOLMIUM LASER  APPLICATION Bilateral 81/82/9937   Procedure: HOLMIUM LASER APPLICATION;  Surgeon: Alexis Frock, MD;  Location[facility];  Service: Urology;  Laterality: Bilateral;   HOLMIUM LASER APPLICATION Bilateral 1/69/6789   Procedure: HOLMIUM LASER APPLICATION;  Surgeon: Alexis Frock, MD;  Location[facility];  Service: Urology;  Laterality: Bilateral;   HOLMIUM LASER APPLICATION Bilateral 3/81/0175   Procedure: HOLMIUM LASER APPLICATION;  Surgeon: Alexis Frock, MD;  Location[facility];  Service: Urology;  Laterality: Bilateral;   PERCUTANEOUS NEPHROSTOLITHOTOMY  2003;  2009;  06-22-2014; 01-30-2015  @ [facility]   URETEROSCOPIC STONE MANIPULATION UNILATERAL  multiple since 1997--  last one 02-19-2017  @ [facility]    No family history on file. Social History:  reports that he has never smoked. He has never used smokeless tobacco. He reports that he does not drink alcohol and does not use drugs.  Allergies:  Allergies  Allergen Reactions   Morphine Nausea And Vomiting   Other Hives and Swelling    [place]    No medications prior to admission.    No results found for this or any previous visit (from the past 48 hour(s)). No results found.  Review of Systems  Constitutional:  Negative for chills and fever.  Genitourinary:  Positive for flank pain.  All other systems reviewed and are negative.   There were no vitals taken for this visit. Physical Exam Vitals reviewed.  HENT:     Head: Normocephalic.  Eyes:     Pupils: Pupils are equal, round, and reactive to light.  Cardiovascular:     Rate and Rhythm: Normal rate.  Pulmonary:     Effort: Pulmonary effort is normal.  Abdominal:     General: Abdomen is flat.  Genitourinary:    Comments: No CVAT at present Musculoskeletal:        General: Normal range of motion.     Cervical back: Normal range of motion.  Neurological:     General: No focal deficit present.     Mental Status: He is alert.  Psychiatric:        Mood  and Affect: Mood normal.      Assessment/Plan  Proceed as planned with BILATERAL ureteroscopic stone manipulation with goal of stone free. Risks, benefits, alternatives, expected peri-op course discussed in detail including need for at least temporary peri-op stents since bilateral procedure. He has very good understanding of competing risks.   Alexis Frock, MD 02/12/2022, 6:54 AM

## 2022-02-12 NOTE — Anesthesia Preprocedure Evaluation (Signed)
Anesthesia Evaluation  Patient identified by MRN, date of birth, ID band Patient awake    Reviewed: Allergy & Precautions, NPO status , Patient's Chart, lab work & pertinent test results  History of Anesthesia Complications Negative for: history of anesthetic complications  Airway Mallampati: III  TM Distance: >3 FB Neck ROM: Full    Dental  (+) Teeth Intact, Dental Advisory Given   Pulmonary neg pulmonary ROS   breath sounds clear to auscultation       Cardiovascular negative cardio ROS  Rhythm:Regular     Neuro/Psych negative neurological ROS  negative psych ROS   GI/Hepatic Neg liver ROS,GERD  ,,  Endo/Other    Renal/GU Renal diseaseRENAL AND URETERAL STONES     Musculoskeletal negative musculoskeletal ROS (+)    Abdominal  (+) + obese  Peds  Hematology negative hematology ROS (+)   Anesthesia Other Findings   Reproductive/Obstetrics                             Anesthesia Physical Anesthesia Plan  ASA: 2  Anesthesia Plan: General   Post-op Pain Management: Minimal or no pain anticipated   Induction: Intravenous  PONV Risk Score and Plan: 2 and Ondansetron, Midazolam and Treatment may vary due to age or medical condition  Airway Management Planned: LMA  Additional Equipment: None  Intra-op Plan:   Post-operative Plan: Extubation in OR  Informed Consent: I have reviewed the patients History and Physical, chart, labs and discussed the procedure including the risks, benefits and alternatives for the proposed anesthesia with the patient or authorized representative who has indicated his/her understanding and acceptance.     Dental advisory given  Plan Discussed with: CRNA  Anesthesia Plan Comments:         Anesthesia Quick Evaluation

## 2022-02-12 NOTE — Brief Op Note (Signed)
02/12/2022  6:02 PM  PATIENT:  Devin Sanders  42 y.o. male  PRE-OPERATIVE DIAGNOSIS:  BILATERAL RENAL STONES  POST-OPERATIVE DIAGNOSIS:  BILATERAL RENAL STONES  PROCEDURE:  Procedure(s) with comments: CYSTOSCOPY WITH RETROGRADE PYELOGRAM, URETEROSCOPY AND STENT PLACEMENT (Bilateral) - 75 MINS HOLMIUM LASER APPLICATION (Bilateral)  SURGEON:  Surgeon(s) and Role:    Alexis Frock, MD - Primary  PHYSICIAN ASSISTANT:   ASSISTANTS: none   ANESTHESIA:   general  EBL:  minimal   BLOOD ADMINISTERED:none  DRAINS: none   LOCAL MEDICATIONS USED:  NONE  SPECIMEN:  Source of Specimen:  bilateral renal stone fragments  DISPOSITION OF SPECIMEN:   given to patient  COUNTS:  YES  TOURNIQUET:  * No tourniquets in log *  DICTATION: .Other Dictation: Dictation Number [id number]  PLAN OF CARE: Discharge to home after PACU  PATIENT DISPOSITION:  PACU - hemodynamically stable.   Delay start of Pharmacological VTE agent (>24hrs) due to surgical blood loss or risk of bleeding: not applicable

## 2022-02-13 ENCOUNTER — Encounter (HOSPITAL_COMMUNITY): Payer: Self-pay | Admitting: Urology

## 2022-02-14 ENCOUNTER — Inpatient Hospital Stay (HOSPITAL_COMMUNITY)
Admission: EM | Admit: 2022-02-14 | Discharge: 2022-02-19 | DRG: 694 | Disposition: A | Discharge status: Home / Self Care | Payer: Commercial Managed Care - PPO | Attending: Urology | Admitting: Urology

## 2022-02-14 ENCOUNTER — Emergency Department (HOSPITAL_COMMUNITY): Payer: Commercial Managed Care - PPO

## 2022-02-14 DIAGNOSIS — E7201 Cystinuria: Secondary | ICD-10-CM | POA: Diagnosis present

## 2022-02-14 DIAGNOSIS — Z8616 Personal history of COVID-19: Secondary | ICD-10-CM

## 2022-02-14 DIAGNOSIS — N1330 Unspecified hydronephrosis: Principal | ICD-10-CM

## 2022-02-14 DIAGNOSIS — N132 Hydronephrosis with renal and ureteral calculous obstruction: Principal | ICD-10-CM | POA: Diagnosis present

## 2022-02-14 DIAGNOSIS — Z79899 Other long term (current) drug therapy: Secondary | ICD-10-CM

## 2022-02-14 DIAGNOSIS — K219 Gastro-esophageal reflux disease without esophagitis: Secondary | ICD-10-CM | POA: Diagnosis present

## 2022-02-14 DIAGNOSIS — R0602 Shortness of breath: Secondary | ICD-10-CM | POA: Diagnosis present

## 2022-02-14 DIAGNOSIS — G8918 Other acute postprocedural pain: Secondary | ICD-10-CM | POA: Diagnosis present

## 2022-02-14 DIAGNOSIS — Z765 Malingerer [conscious simulation]: Secondary | ICD-10-CM

## 2022-02-14 DIAGNOSIS — N23 Unspecified renal colic: Secondary | ICD-10-CM | POA: Diagnosis present

## 2022-02-14 DIAGNOSIS — Z87442 Personal history of urinary calculi: Secondary | ICD-10-CM

## 2022-02-14 DIAGNOSIS — R609 Edema, unspecified: Secondary | ICD-10-CM | POA: Diagnosis present

## 2022-02-14 LAB — URINALYSIS, MICROSCOPIC (REFLEX)
RBC / HPF: >50 RBC/hpf (ref 0–5)
Squamous Epithelial / HPF: NONE SEEN /HPF (ref 0–5)

## 2022-02-14 LAB — CBC WITH DIFFERENTIAL/PLATELET
Abs Immature Granulocytes: 0.01 10*3/uL (ref 0.00–0.07)
Basophils Absolute: 0 10*3/uL (ref 0.0–0.1)
Basophils Relative: 0 %
Eosinophils Absolute: 0.1 10*3/uL (ref 0.0–0.5)
Eosinophils Relative: 3 %
HCT: 39.6 % (ref 39.0–52.0)
Hemoglobin: 13.2 g/dL (ref 13.0–17.0)
Immature Granulocytes: 0 %
Lymphocytes Relative: 32 %
Lymphs Abs: 1.8 10*3/uL (ref 0.7–4.0)
MCH: 32.8 pg (ref 26.0–34.0)
MCHC: 33.3 g/dL (ref 30.0–36.0)
MCV: 98.5 fL (ref 80.0–100.0)
Monocytes Absolute: 0.5 10*3/uL (ref 0.1–1.0)
Monocytes Relative: 9 %
Neutro Abs: 3.1 10*3/uL (ref 1.7–7.7)
Neutrophils Relative %: 56 %
Platelets: 249 10*3/uL (ref 150–400)
RBC: 4.02 MIL/uL — ABNORMAL LOW (ref 4.22–5.81)
RDW: 11.6 % (ref 11.5–15.5)
WBC: 5.5 10*3/uL (ref 4.0–10.5)
nRBC: 0 % (ref 0.0–0.2)

## 2022-02-14 LAB — URINALYSIS, ROUTINE W REFLEX MICROSCOPIC

## 2022-02-14 MED ORDER — KETOROLAC TROMETHAMINE 30 MG/ML IJ SOLN
30.0000 mg | Freq: Once | INTRAMUSCULAR | Status: AC
  Administered 2022-02-14: 30 mg via INTRAVENOUS
  Filled 2022-02-14: qty 1

## 2022-02-14 MED ORDER — ONDANSETRON HCL 4 MG/2ML IJ SOLN
4.0000 mg | Freq: Once | INTRAMUSCULAR | Status: AC
  Administered 2022-02-14: 4 mg via INTRAVENOUS
  Filled 2022-02-14: qty 2

## 2022-02-14 NOTE — ED Provider Notes (Signed)
[facility] Emergency Department Provider Note MRN:  [id number]  Arrival date & time: 02/15/22     Chief Complaint   No chief complaint on file.   History of Present Illness   Devin Sanders is a 42 y.o. year-old male presents to the ED with chief complaint of bilateral flank pain.  Hx of recent ureteroscopy and stent placement for kidney stones.  States that he pulled his stents today.  Now reports worsening pain.  States that he hasn't been able to control his pain because of vomiting.  Denies fevers or chills.  Has had SOB.  History provided by patient.   Review of Systems  Pertinent positive and negative review of systems noted in HPI.    Physical Exam   Vitals:   02/15/22 0200 02/15/22 0219  BP: 127/86   Pulse: 78   Resp: 16   Temp:  98.2 F (36.8 C)  SpO2: 93%     CONSTITUTIONAL:  well-appearing, NAD NEURO:  Alert and oriented x 3, CN 3-12 grossly intact EYES:  eyes equal and reactive ENT/NECK:  Supple, no stridor  CARDIO:  tachycardic, regular rhythm, appears well-perfused  PULM:  No respiratory distress, CTAB GI/GU:  non-distended, generalized lower abdominal tenderness MSK/SPINE:  No gross deformities, no edema, moves all extremities  SKIN:  no rash, atraumatic   *Additional and/or pertinent findings included in MDM below  Diagnostic and Interventional Summary    EKG Interpretation  Date/Time:    Ventricular Rate:    PR Interval:    QRS Duration:   QT Interval:    QTC Calculation:   R Axis:     Text Interpretation:         Labs Reviewed  URINALYSIS, ROUTINE W REFLEX MICROSCOPIC - Abnormal; Notable for the following components:      Result Value   Color, Urine RED (*)    APPearance CLOUDY (*)    Glucose, UA   (*)    Value: TEST NOT REPORTED DUE TO COLOR INTERFERENCE OF URINE PIGMENT   Hgb urine dipstick   (*)    Value: TEST NOT REPORTED DUE TO COLOR INTERFERENCE OF URINE PIGMENT   Bilirubin Urine   (*)    Value:  TEST NOT REPORTED DUE TO COLOR INTERFERENCE OF URINE PIGMENT   Ketones, ur   (*)    Value: TEST NOT REPORTED DUE TO COLOR INTERFERENCE OF URINE PIGMENT   Protein, ur   (*)    Value: TEST NOT REPORTED DUE TO COLOR INTERFERENCE OF URINE PIGMENT   Nitrite   (*)    Value: TEST NOT REPORTED DUE TO COLOR INTERFERENCE OF URINE PIGMENT   Leukocytes,Ua   (*)    Value: TEST NOT REPORTED DUE TO COLOR INTERFERENCE OF URINE PIGMENT   All other components within normal limits  CBC WITH DIFFERENTIAL/PLATELET - Abnormal; Notable for the following components:   RBC 4.02 (*)    All other components within normal limits  BASIC METABOLIC PANEL - Abnormal; Notable for the following components:   Glucose, Bld 114 (*)    BUN 28 (*)    Creatinine, Ser 1.25 (*)    Calcium 8.4 (*)    All other components within normal limits  URINALYSIS, MICROSCOPIC (REFLEX) - Abnormal; Notable for the following components:   Bacteria, UA RARE (*)    All other components within normal limits    US RENAL  Final Result      Medications  diphenhydrAMINE (BENADRYL) injection 25 mg (  has no administration in time range)  tamsulosin (FLOMAX) capsule 0.4 mg (has no administration in time range)  ketorolac (TORADOL) 30 MG/ML injection 30 mg (has no administration in time range)  HYDROmorphone (DILAUDID) injection 0.5 mg (has no administration in time range)  0.9 %  sodium chloride infusion (has no administration in time range)  ketorolac (TORADOL) 30 MG/ML injection 30 mg (30 mg Intravenous Given 02/14/22 2317)  ondansetron (ZOFRAN) injection 4 mg (4 mg Intravenous Given 02/14/22 2317)  HYDROmorphone (DILAUDID) injection 1 mg (1 mg Intravenous Given 02/15/22 0035)  diphenhydrAMINE (BENADRYL) injection 25 mg (25 mg Intravenous Given 02/15/22 0035)  oxyCODONE-acetaminophen (PERCOCET/ROXICET) 5-325 MG per tablet 2 tablet (2 tablets Oral Given 02/15/22 0153)  ketorolac (TORADOL) 30 MG/ML injection 30 mg (30 mg Intramuscular Given 02/15/22  0216)  sodium chloride 0.9 % bolus 1,000 mL (1,000 mLs Intravenous New Bag/Given 02/15/22 0213)  HYDROmorphone (DILAUDID) injection 1 mg (1 mg Intravenous Given 02/15/22 0302)     Procedures  /  Critical Care Procedures  ED Course and Medical Decision Making  I have reviewed the triage vital signs, the nursing notes, and pertinent available records from the EMR.  Social Determinants Affecting Complexity of Care: Patient has no clinically significant social determinants affecting this chief complaint..   ED Course:    Medical Decision Making Patient here with ureteral colic.  Had stents removed earlier today.  Had recent ureteroscopy and stent placement due to kidney stones and hydronephrosis.  Reports persistent pain despite Percocet at home.  Unfortunately, been unable to control the patient's pain despite several doses of Dilaudid, Toradol, fluids, and Percocet.  I discussed the case with Dr. Milford Cage, from urology, who recommends additional fluid and Toradol.  Unfortunately, patient still not well pain controlled and requesting admission to the hospital.  I discussed the case again with Dr. Milford Cage, who asked that I place temporary admission orders and pain medication for the patient.  He will see patient on rounds in the morning.  Amount and/or Complexity of Data Reviewed Labs: ordered. Radiology: ordered.  Risk Prescription drug management. Decision regarding hospitalization.     Consultants: I discussed the case with Dr. Milford Cage, who appreciated for bringing patient into the hospital.   Treatment and Plan: Patient's exam and diagnostic results are concerning for ureteral colic without adequate pain control.  Feel that patient will need admission to the hospital for further treatment and evaluation.    Final Clinical Impressions(s) / ED Diagnoses     ICD-10-CM   1. Hydronephrosis, unspecified hydronephrosis type  N13.30       ED Discharge Orders     None          Discharge Instructions Discussed with and Provided to Patient:   Discharge Instructions   None      Montine Circle, PA-C 02/15/22 0330    Orpah Greek, MD 02/15/22 [phone]

## 2022-02-14 NOTE — ED Triage Notes (Addendum)
Pt self removed stents this evening as directed and started having severe pain in bilateral lower abd. This has occurred the last 2 times that he removed stents. Theses were places for frequent stones.   Denies fever

## 2022-02-15 ENCOUNTER — Other Ambulatory Visit: Payer: Self-pay

## 2022-02-15 ENCOUNTER — Encounter (HOSPITAL_COMMUNITY): Payer: Self-pay | Admitting: Urology

## 2022-02-15 DIAGNOSIS — N23 Unspecified renal colic: Secondary | ICD-10-CM | POA: Diagnosis present

## 2022-02-15 DIAGNOSIS — N1330 Unspecified hydronephrosis: Secondary | ICD-10-CM | POA: Diagnosis present

## 2022-02-15 LAB — BASIC METABOLIC PANEL
Anion gap: 9 (ref 5–15)
BUN: 28 mg/dL — ABNORMAL HIGH (ref 6–20)
CO2: 24 mmol/L (ref 22–32)
Calcium: 8.4 mg/dL — ABNORMAL LOW (ref 8.9–10.3)
Chloride: 104 mmol/L (ref 98–111)
Creatinine, Ser: 1.25 mg/dL — ABNORMAL HIGH (ref 0.61–1.24)
GFR, Estimated: >60 mL/min (ref >60)
Glucose, Bld: 114 mg/dL — ABNORMAL HIGH (ref 70–99)
Potassium: 3.9 mmol/L (ref 3.5–5.1)
Sodium: 137 mmol/L (ref 135–145)

## 2022-02-15 MED ORDER — KCL IN DEXTROSE-NACL 20-5-0.45 MEQ/L-%-% IV SOLN
INTRAVENOUS | Status: DC
  Filled 2022-02-15 (×6): qty 1000

## 2022-02-15 MED ORDER — HYDROMORPHONE HCL 1 MG/ML IJ SOLN
1.0000 mg | Freq: Once | INTRAMUSCULAR | Status: AC
  Administered 2022-02-15: 1 mg via INTRAVENOUS
  Filled 2022-02-15: qty 1

## 2022-02-15 MED ORDER — ONDANSETRON HCL 4 MG/2ML IJ SOLN: 4.0000 mg | INTRAMUSCULAR | Status: DC | PRN

## 2022-02-15 MED ORDER — DIPHENHYDRAMINE HCL 50 MG/ML IJ SOLN
25.0000 mg | Freq: Once | INTRAMUSCULAR | Status: AC
  Administered 2022-02-15: 25 mg via INTRAVENOUS
  Filled 2022-02-15: qty 1

## 2022-02-15 MED ORDER — ORAL CARE MOUTH RINSE: 15.0000 mL | OROMUCOSAL | Status: DC | PRN

## 2022-02-15 MED ORDER — KETOROLAC TROMETHAMINE 30 MG/ML IJ SOLN
30.0000 mg | Freq: Once | INTRAMUSCULAR | Status: AC
  Administered 2022-02-15: 30 mg via INTRAMUSCULAR
  Filled 2022-02-15: qty 1

## 2022-02-15 MED ORDER — HYDROMORPHONE HCL 1 MG/ML IJ SOLN
0.5000 mg | INTRAMUSCULAR | Status: DC | PRN
  Administered 2022-02-15 – 2022-02-19 (×39): 0.5 mg via INTRAVENOUS
  Filled 2022-02-15 (×40): qty 0.5

## 2022-02-15 MED ORDER — DIPHENHYDRAMINE HCL 12.5 MG/5ML PO ELIX
12.5000 mg | ORAL_SOLUTION | Freq: Four times a day (QID) | ORAL | Status: DC | PRN
  Administered 2022-02-17: 12.5 mg via ORAL
  Administered 2022-02-18 – 2022-02-19 (×2): 25 mg via ORAL
  Filled 2022-02-15 (×3): qty 10

## 2022-02-15 MED ORDER — KETOROLAC TROMETHAMINE 30 MG/ML IJ SOLN
30.0000 mg | Freq: Three times a day (TID) | INTRAMUSCULAR | Status: AC
  Administered 2022-02-15 (×2): 30 mg via INTRAVENOUS
  Filled 2022-02-15 (×2): qty 1

## 2022-02-15 MED ORDER — OXYCODONE-ACETAMINOPHEN 5-325 MG PO TABS
2.0000 | ORAL_TABLET | Freq: Once | ORAL | Status: AC
  Administered 2022-02-15: 2 via ORAL
  Filled 2022-02-15: qty 2

## 2022-02-15 MED ORDER — ACETAMINOPHEN 325 MG PO TABS: 650.0000 mg | ORAL_TABLET | ORAL | Status: DC | PRN

## 2022-02-15 MED ORDER — SODIUM CHLORIDE 0.9 % IV SOLN: INTRAVENOUS | Status: AC

## 2022-02-15 MED ORDER — SODIUM CHLORIDE 0.9 % IV BOLUS
1000.0000 mL | Freq: Once | INTRAVENOUS | Status: AC
  Administered 2022-02-15: 1000 mL via INTRAVENOUS

## 2022-02-15 MED ORDER — TAMSULOSIN HCL 0.4 MG PO CAPS
0.4000 mg | ORAL_CAPSULE | Freq: Every day | ORAL | Status: DC
  Administered 2022-02-15 – 2022-02-19 (×5): 0.4 mg via ORAL
  Filled 2022-02-15 (×5): qty 1

## 2022-02-15 MED ORDER — DIPHENHYDRAMINE HCL 50 MG/ML IJ SOLN
12.5000 mg | Freq: Four times a day (QID) | INTRAMUSCULAR | Status: DC | PRN
  Administered 2022-02-15 – 2022-02-18 (×12): 25 mg via INTRAVENOUS
  Filled 2022-02-15 (×12): qty 1

## 2022-02-15 NOTE — ED Notes (Signed)
ED TO INPATIENT HANDOFF REPORT  Name/Age/Gender Devin Sanders 42 y.o. male  Code Status Code Status History     Date Active Date Inactive Code Status Order ID Comments User Context   02/18/2021 1526 02/20/2021 1927 Full Code [id number]  Vira Agar, MD ED   02/15/2021 1139 02/16/2021 2111 Full Code [id number]  Alexis Frock, MD Inpatient   10/15/2020 0651 10/18/2020 2108 Full Code [id number]  Ceasar Mons, MD ED   09/28/2017 0854 09/28/2017 2048 Full Code [id number]  Festus Aloe, MD Inpatient       Home/SNF/Other Home  Chief Complaint Hydronephrosis [N13.30]  Level of Care/Admitting Diagnosis ED Disposition     ED Disposition  Admit   Condition  --   [facility]: [facility] [[id number]]  Level of Care: Med-Surg [16]  May place patient in observation at [facility] or [place] if equivalent level of care is available:: Yes  Covid Evaluation: Asymptomatic - no recent exposure (last 10 days) testing not required  Diagnosis: Hydronephrosis [591.ICD-9-CM]  Admitting Physician: Remi Haggard [id number]  Attending Physician: Remi Haggard [id number]          Medical History Past Medical History:  Diagnosis Date   COVID 02/2018   asymptomatic was exposed   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)   GERD (gastroesophageal reflux disease)    History of kidney stones    Left ureteral calculus    Renal calculus, left    Wears glasses     Allergies Allergies  Allergen Reactions   Morphine Nausea And Vomiting   Other Hives and Swelling    Mangos    IV Location/Drains/Wounds Patient Lines/Drains/Airways Status     Active Line/Drains/Airways     Name Placement date Placement time Site Days   Peripheral IV 02/04/22 Right Hand 02/04/22  2330  Hand  11   Peripheral IV 02/12/22 18 G 1" Left Hand 02/12/22  1506  Hand  3   Peripheral IV 02/14/22 20 G Left Wrist 02/14/22  2312  Wrist  1    Ureteral Drain/Stent Left ureter 5 Fr. 09/27/21  1503  Left ureter  141   Ureteral Drain/Stent Right ureter 5 Fr. 09/27/21  1503  Right ureter  141   Ureteral Drain/Stent Right ureter 5 Fr. 02/12/22  1752  Right ureter  3   Ureteral Drain/Stent Left ureter 5 Fr. 02/12/22  1753  Left ureter  3            Labs/Imaging Results for orders placed or performed during the hospital encounter of 02/14/22 (from the past 48 hour(s))  Urinalysis, Routine w reflex microscopic -Urine, Clean Catch     Status: Abnormal   Collection Time: 02/14/22 10:35 PM  Result Value Ref Range   Color, Urine RED (A) YELLOW    Comment: BIOCHEMICALS MAY BE AFFECTED BY COLOR   APPearance CLOUDY (A) CLEAR   Specific Gravity, Urine  1.005 - 1.030    TEST NOT REPORTED DUE TO COLOR INTERFERENCE OF URINE PIGMENT   pH  5.0 - 8.0    TEST NOT REPORTED DUE TO COLOR INTERFERENCE OF URINE PIGMENT   Glucose, UA (A) NEGATIVE mg/dL    TEST NOT REPORTED DUE TO COLOR INTERFERENCE OF URINE PIGMENT   Hgb urine dipstick (A) NEGATIVE    TEST NOT REPORTED DUE TO COLOR INTERFERENCE OF URINE PIGMENT   Bilirubin Urine (A) NEGATIVE    TEST NOT REPORTED DUE TO COLOR  INTERFERENCE OF URINE PIGMENT   Ketones, ur (A) NEGATIVE mg/dL    TEST NOT REPORTED DUE TO COLOR INTERFERENCE OF URINE PIGMENT   Protein, ur (A) NEGATIVE mg/dL    TEST NOT REPORTED DUE TO COLOR INTERFERENCE OF URINE PIGMENT   Nitrite (A) NEGATIVE    TEST NOT REPORTED DUE TO COLOR INTERFERENCE OF URINE PIGMENT   Leukocytes,Ua (A) NEGATIVE    TEST NOT REPORTED DUE TO COLOR INTERFERENCE OF URINE PIGMENT    Comment: Performed at [facility], [place] [address], [place], [place] [address]  Urinalysis, Microscopic (reflex)     Status: Abnormal   Collection Time: 02/14/22 10:35 PM  Result Value Ref Range   RBC / HPF >50 0 - 5 RBC/hpf   WBC, UA 6-10 0 - 5 WBC/hpf   Bacteria, UA RARE (A) NONE SEEN   Squamous Epithelial / HPF NONE SEEN 0 - 5 /HPF    Comment:  Performed at [facility], [place] [address], [place], [place] [address]  CBC with Differential     Status: Abnormal   Collection Time: 02/14/22 11:22 PM  Result Value Ref Range   WBC 5.5 4.0 - 10.5 K/uL   RBC 4.02 (L) 4.22 - 5.81 MIL/uL   Hemoglobin 13.2 13.0 - 17.0 g/dL   HCT 39.6 39.0 - 52.0 %   MCV 98.5 80.0 - 100.0 fL   MCH 32.8 26.0 - 34.0 pg   MCHC 33.3 30.0 - 36.0 g/dL   RDW 11.6 11.5 - 15.5 %   Platelets 249 150 - 400 K/uL   nRBC 0.0 0.0 - 0.2 %   Neutrophils Relative % 56 %   Neutro Abs 3.1 1.7 - 7.7 K/uL   Lymphocytes Relative 32 %   Lymphs Abs 1.8 0.7 - 4.0 K/uL   Monocytes Relative 9 %   Monocytes Absolute 0.5 0.1 - 1.0 K/uL   Eosinophils Relative 3 %   Eosinophils Absolute 0.1 0.0 - 0.5 K/uL   Basophils Relative 0 %   Basophils Absolute 0.0 0.0 - 0.1 K/uL   Immature Granulocytes 0 %   Abs Immature Granulocytes 0.01 0.00 - 0.07 K/uL    Comment: Performed at [facility] : [facility], [place] [address] [place], [place] [id number]  Basic metabolic panel     Status: Abnormal   Collection Time: 02/14/22 11:22 PM  Result Value Ref Range   Sodium 137 135 - 145 mmol/L   Potassium 3.9 3.5 - 5.1 mmol/L   Chloride 104 98 - 111 mmol/L   CO2 24 22 - 32 mmol/L   Glucose, Bld 114 (H) 70 - 99 mg/dL    Comment: Glucose reference range applies only to samples taken after fasting for at least 8 hours.   BUN 28 (H) 6 - 20 mg/dL   Creatinine, Ser 1.25 (H) 0.61 - 1.24 mg/dL   Calcium 8.4 (L) 8.9 - 10.3 mg/dL   GFR, Estimated >60 >60 mL/min    Comment: (NOTE) Calculated using the CKD-EPI Creatinine Equation (2021)    Anion gap 9 5 - 15    Comment: Performed at [facility], [place] [address] [place], [place] [address]   US RENAL  Result Date: 02/14/2022 CLINICAL DATA:  Flank pain. Recent stent removal. History of kidney stones EXAM: RENAL / URINARY TRACT ULTRASOUND COMPLETE COMPARISON:  CT 02/04/2022 FINDINGS: Right Kidney: Renal measurements:  14.3 x 6.3 x 5.7 cm = volume: 269 mL. Moderate hydronephrosis. Upper pole cyst measures 11 mm. Multiple  stones. Lower pole stone measures 11 mm. Normal echotexture. Left Kidney: Renal measurements: 12.2 x 5.1 x 4.7 cm = volume: 153 mL. Mild hydronephrosis. Multiple stones. The largest 10 mm. Normal echotexture. Bladder: Appears normal for degree of bladder distention. Other: None. IMPRESSION: Bilateral nephrolithiasis. Moderate right hydronephrosis and mild left hydronephrosis. Electronically Signed   By: Rolm Baptise M.D.   On: 02/14/2022 23:21    Pending Labs Unresulted Labs (From admission, onward)    None       Vitals/Pain Today's Vitals   02/15/22 0200 02/15/22 0219 02/15/22 0234 02/15/22 0303  BP: 127/86     Pulse: 78     Resp: 16     Temp:  98.2 F (36.8 C)    TempSrc:  Oral    SpO2: 93%     PainSc:   8  8     Isolation Precautions No active isolations  Medications Medications  tamsulosin (FLOMAX) capsule 0.4 mg (has no administration in time range)  ketorolac (TORADOL) 30 MG/ML injection 30 mg (has no administration in time range)  HYDROmorphone (DILAUDID) injection 0.5 mg (has no administration in time range)  0.9 %  sodium chloride infusion ( Intravenous New Bag/Given 02/15/22 0338)  ketorolac (TORADOL) 30 MG/ML injection 30 mg (30 mg Intravenous Given 02/14/22 2317)  ondansetron (ZOFRAN) injection 4 mg (4 mg Intravenous Given 02/14/22 2317)  HYDROmorphone (DILAUDID) injection 1 mg (1 mg Intravenous Given 02/15/22 0035)  diphenhydrAMINE (BENADRYL) injection 25 mg (25 mg Intravenous Given 02/15/22 0035)  oxyCODONE-acetaminophen (PERCOCET/ROXICET) 5-325 MG per tablet 2 tablet (2 tablets Oral Given 02/15/22 0153)  ketorolac (TORADOL) 30 MG/ML injection 30 mg (30 mg Intramuscular Given 02/15/22 0216)  sodium chloride 0.9 % bolus 1,000 mL (0 mLs Intravenous Stopped 02/15/22 0339)  HYDROmorphone (DILAUDID) injection 1 mg (1 mg Intravenous Given 02/15/22 0302)  diphenhydrAMINE  (BENADRYL) injection 25 mg (25 mg Intravenous Given 02/15/22 0337)    Mobility walks

## 2022-02-15 NOTE — H&P (Addendum)
H&P  Chief Complaint: Flank pain nausea vomiting  History of Present Illness: Devin Sanders is a 42 y.o. year old white male who underwent recent bilateral cystoscopy ureteroscopy and pyeloscopy with laser lithotripsy of renal calculi by Dr. Tresa Moore on 02/12/2022.  Had bilateral JJ stents placed and was told to self remove these.  Stents were taken out yesterday and has had severe right-sided flank pain and nausea vomiting.  Came back to the emergency room ultrasound shows some moderate hydronephrosis on the right and mild hydronephrosis of the left possible stone in the right kidney.  Patient unable to get pain under control in the emergency room was admitted for observation for IV fluids and pain management.  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)   GERD (gastroesophageal reflux disease)    History of kidney stones    Left ureteral calculus    Renal calculus, left    Wears glasses     Past Surgical History:  Procedure Laterality Date   CYSTOSCOPY WITH RETROGRADE PYELOGRAM, URETEROSCOPY AND STENT PLACEMENT Bilateral 06/26/2017   Procedure: CYSTOSCOPY WITH RETROGRADE PYELOGRAM, URETEROSCOPY, STONE BASKETRY  AND STENT PLACEMENT;  Surgeon: Alexis Frock, MD;  Location: [facility];  Service: Urology;  Laterality: Bilateral;   CYSTOSCOPY WITH RETROGRADE PYELOGRAM, URETEROSCOPY AND STENT PLACEMENT Right 09/18/2017   Procedure: CYSTOSCOPY WITH RETROGRADE PYELOGRAM, URETEROSCOPY AND STENT PLACEMENT;  Surgeon: Ardis Hughs, MD;  Location: [facility];  Service: Urology;  Laterality: Right;   CYSTOSCOPY WITH RETROGRADE PYELOGRAM, URETEROSCOPY AND STENT PLACEMENT Bilateral 10/13/2017   Procedure: CYSTOSCOPY WITH RETROGRADE PYELOGRAM, BILATERAL URETEROSCOPY AND BILATERAL STENT PLACEMENT, LASER;  Surgeon: Alexis Frock, MD;  Location: [facility];  Service: Urology;  Laterality:  Bilateral;  58 MINS   CYSTOSCOPY WITH RETROGRADE PYELOGRAM, URETEROSCOPY AND STENT PLACEMENT Bilateral 03/24/2018   Procedure: CYSTOSCOPY WITH RETROGRADE PYELOGRAM, URETEROSCOPY AND STENT PLACEMENT;  Surgeon: Alexis Frock, MD;  Location: [facility];  Service: Urology;  Laterality: Bilateral;  1 HR   CYSTOSCOPY WITH RETROGRADE PYELOGRAM, URETEROSCOPY AND STENT PLACEMENT Bilateral 07/07/2018   Procedure: CYSTOSCOPY WITH RETROGRADE PYELOGRAM, URETEROSCOPY AND STENT PLACEMENT;  Surgeon: Alexis Frock, MD;  Location[facility];  Service: Urology;  Laterality: Bilateral;  3 MINUTES   CYSTOSCOPY WITH RETROGRADE PYELOGRAM, URETEROSCOPY AND STENT PLACEMENT Left 01/12/2019   Procedure: CYSTOSCOPY WITH RETROGRADE PYELOGRAM, URETEROSCOPY AND STENT PLACEMENT;  Surgeon: Alexis Frock, MD;  Location[facility];  Service: Urology;  Laterality: Left;  90 MINS   CYSTOSCOPY WITH RETROGRADE PYELOGRAM, URETEROSCOPY AND STENT PLACEMENT Left 01/28/2019   Procedure: CYSTOSCOPY WITH RETROGRADE PYELOGRAM, URETEROSCOPY AND STENT EXCHANGE STONE BASKET EXTRACTION ;  Surgeon: Alexis Frock, MD;  Location: [facility];  Service: Urology;  Laterality: Left;   CYSTOSCOPY WITH RETROGRADE PYELOGRAM, URETEROSCOPY AND STENT PLACEMENT Bilateral 05/13/2019   Procedure: CYSTOSCOPY WITH BILATERAL  RETROGRADE PYELOGRAM, LEFT URETEROSCOPY WITH HOLMIUM LASER AND STENT PLACEMENT;  Surgeon: Alexis Frock, MD;  Location[facility];  Service: Urology;  Laterality: Bilateral;   CYSTOSCOPY WITH RETROGRADE PYELOGRAM, URETEROSCOPY AND STENT PLACEMENT Bilateral 11/02/2019   Procedure: CYSTOSCOPY WITH RETROGRADE PYELOGRAM, URETEROSCOPY AND STENT PLACEMENT;  Surgeon: Alexis Frock, MD;  Location: [facility];  Service: Urology;  Laterality: Bilateral;  29 MINS   CYSTOSCOPY WITH RETROGRADE PYELOGRAM, URETEROSCOPY AND STENT PLACEMENT Bilateral 10/12/2020   Procedure: CYSTOSCOPY WITH RETROGRADE PYELOGRAM, URETEROSCOPY, BASKETING OF  STONES AND BILATERAL STENT PLACEMENTS;  Surgeon: Alexis Frock, MD;  Location[facility];  Service: Urology;  Laterality: Bilateral;  18 MINS   CYSTOSCOPY WITH RETROGRADE PYELOGRAM, URETEROSCOPY AND STENT PLACEMENT Bilateral 02/15/2021   Procedure: CYSTOSCOPY WITH RETROGRADE PYELOGRAM, URETEROSCOPY AND STENT PLACEMENT;  Surgeon: Alexis Frock, MD;  Location[facility];  Service: Urology;  Laterality: Bilateral;  75 MINS   CYSTOSCOPY WITH RETROGRADE PYELOGRAM, URETEROSCOPY AND STENT PLACEMENT Bilateral 09/27/2021   Procedure: CYSTOSCOPY WITH RETROGRADE PYELOGRAM, URETEROSCOPY AND STENT PLACEMENT, VASECTOMY;  Surgeon: Alexis Frock, MD;  Location[facility];  Service: Urology;  Laterality: Bilateral;  39 MINS   CYSTOSCOPY WITH RETROGRADE PYELOGRAM, URETEROSCOPY AND STENT PLACEMENT Bilateral 02/12/2022   Procedure: CYSTOSCOPY WITH RETROGRADE PYELOGRAM, URETEROSCOPY AND STENT PLACEMENT;  Surgeon: Alexis Frock, MD;  Location[facility];  Service: Urology;  Laterality: Bilateral;  75 MINS   CYSTOSCOPY/RETROGRADE/URETEROSCOPY/STONE EXTRACTION WITH BASKET Left 10/05/2017   Procedure: CYSTOSCOPY/RETROGRADE/STONE REMOVAL FROM BLADDER ;  Surgeon: Irine Seal, MD;  Location[facility];  Service: Urology;  Laterality: Left;   CYSTOSCOPY/URETEROSCOPY/HOLMIUM LASER/STENT PLACEMENT Right 09/28/2017   Procedure: CYSTOSCOPY/RIGHT RETROGRADE URETEROSCOPY/HOLMIUM LASER/ RIGHT STENT PLACEMENT;  Surgeon: Ceasar Mons, MD;  Location[facility];  Service: Urology;  Laterality: Right;   CYSTOSCOPY/URETEROSCOPY/HOLMIUM LASER/STENT PLACEMENT Bilateral 01/27/2018   Procedure: CYSTOSCOPY/URETEROSCOPY/HOLMIUM LASER/STENT PLACEMENT;  Surgeon: Alexis Frock, MD;  Location: [facility];  Service: Urology;  Laterality: Bilateral;   CYSTOSCOPY/URETEROSCOPY/HOLMIUM LASER/STENT PLACEMENT Right 08/08/2020   Procedure: CYSTOSCOPY/URETEROSCOPY/RETROGRADE/ HOLMIUM LASER/STENT PLACEMENT;  Surgeon: Franchot Gallo, MD;  Location:  [facility];  Service: Urology;  Laterality: Right;   HOLMIUM LASER APPLICATION Bilateral [id number]   Procedure: HOLMIUM LASER APPLICATION;  Surgeon: Alexis Frock, MD;  Location: [facility];  Service: Urology;  Laterality: Bilateral;   HOLMIUM LASER APPLICATION Right [id number]   Procedure: HOLMIUM LASER APPLICATION;  Surgeon: Ardis Hughs, MD;  [facility];  Service: Urology;  Laterality: Right;   HOLMIUM LASER APPLICATION Bilateral [id number]   Procedure: HOLMIUM LASER APPLICATION;  Surgeon: Alexis Frock, MD;  Location[facility];  Service: Urology;  Laterality: Bilateral;   HOLMIUM LASER APPLICATION Bilateral [id number]   Procedure: HOLMIUM LASER APPLICATION;  Surgeon: Alexis Frock, MD;  Location[facility];  Service: Urology;  Laterality: Bilateral;   HOLMIUM LASER APPLICATION Bilateral 0000000   Procedure: HOLMIUM LASER APPLICATION;  Surgeon: Alexis Frock, MD;  Location[facility];  Service: Urology;  Laterality: Bilateral;   HOLMIUM LASER APPLICATION Bilateral [id number]   Procedure: HOLMIUM LASER APPLICATION;  Surgeon: Alexis Frock, MD;  Location[facility];  Service: Urology;  Laterality: Bilateral;   HOLMIUM LASER APPLICATION Left [id number]   Procedure: HOLMIUM LASER APPLICATION;  Surgeon: Alexis Frock, MD;  [facility];  Service: Urology;  Laterality: Left;   HOLMIUM LASER APPLICATION Bilateral [id number]   Procedure: HOLMIUM LASER APPLICATION;  Surgeon: Alexis Frock, MD;  Location[facility];  Service: Urology;  Laterality: Bilateral;   HOLMIUM LASER APPLICATION Bilateral [id number]   Procedure: HOLMIUM LASER APPLICATION;  Surgeon: Alexis Frock, MD;  [facility];  Service: Urology;  Laterality: Bilateral;   HOLMIUM LASER APPLICATION Bilateral 0000000   Procedure: HOLMIUM LASER APPLICATION;  Surgeon: Alexis Frock, MD;  [facility];  Service: Urology;  Laterality: Bilateral;   HOLMIUM LASER  APPLICATION Bilateral 0000000   Procedure: HOLMIUM LASER APPLICATION;  Surgeon: Alexis Frock, MD;  [facility];  Service: Urology;  Laterality: Bilateral;   PERCUTANEOUS NEPHROSTOLITHOTOMY  2003;  2009;  06-22-2014; 01-30-2015  @ [facility]   URETEROSCOPIC STONE MANIPULATION  UNILATERAL  multiple since 1997--  last one 02-19-2017  @ [facility]    Home Medications:  No current facility-administered medications on file prior to encounter.   Current Outpatient Medications on File Prior to Encounter  Medication Sig Dispense Refill   ketorolac (TORADOL) 10 MG tablet Take 1 tablet (10 mg total) by mouth every 8 (eight) hours as needed for moderate pain. Post-operatively 20 tablet 0   ondansetron (ZOFRAN-ODT) 4 MG disintegrating tablet Take 1 tablet (4 mg total) by mouth every 8 (eight) hours as needed for nausea or vomiting. 15 tablet 0   oxyCODONE-acetaminophen (PERCOCET/ROXICET) 5-325 MG tablet Take 1 tablet by mouth every 6 (six) hours as needed for severe pain. 10 tablet 0   promethazine (PHENERGAN) 25 MG tablet Take 1 tablet (25 mg total) by mouth every 8 (eight) hours as needed for nausea or vomiting. (Patient not taking: Reported on 02/07/2022) 20 tablet 0   tamsulosin (FLOMAX) 0.4 MG CAPS capsule Take 1 capsule (0.4 mg total) by mouth daily. 30 capsule 0   THIOLA EC 300 MG TBEC Take 300 mg by mouth in the morning, at noon, and at bedtime.       Allergies:  Allergies  Allergen Reactions   Morphine Nausea And Vomiting   Other Hives and Swelling    Mangos    History reviewed. No pertinent family history.  Social History:  reports that he has never smoked. He has never used smokeless tobacco. He reports that he does not drink alcohol and does not use drugs.  ROS: A complete review of systems was performed.  All systems are negative except for pertinent findings as noted.  Physical Exam:  Vital signs in last 24 hours: Temp:  [97.9 F (36.6 C)-98.2 F (36.8 C)] 97.9 F (36.6 C) (02/10  0407) Pulse Rate:  [78-114] 78 (02/10 0407) Resp:  [16-20] 20 (02/10 0407) BP: (127-150)/(86-101) 150/101 (02/10 0407) SpO2:  [93 %-100 %] 100 % (02/10 0407) Weight:  [101.4 kg] 101.4 kg (02/10 0415) Constitutional:  Alert and oriented, No acute distress Cardiovascular: Regular rate and rhythm, No JVD Respiratory: Normal respiratory effort, Lungs clear bilaterally GI: Abdomen is soft, moderate RLQ tenderness nondistended, no abdominal masses GU: No CVA tenderness Lymphatic: No lymphadenopathy Neurologic: Grossly intact, no focal deficits Psychiatric: Normal mood and affect   Laboratory Data:  Recent Labs    02/14/22 2322  WBC 5.5  HGB 13.2  HCT 39.6  PLT 249    Recent Labs    02/14/22 2322  NA 137  K 3.9  CL 104  GLUCOSE 114*  BUN 28*  CALCIUM 8.4*  CREATININE 1.25*     Results for orders placed or performed during the hospital encounter of 02/14/22 (from the past 24 hour(s))  Urinalysis, Routine w reflex microscopic -Urine, Clean Catch     Status: Abnormal   Collection Time: 02/14/22 10:35 PM  Result Value Ref Range   Color, Urine RED (A) YELLOW   APPearance CLOUDY (A) CLEAR   Specific Gravity, Urine  1.005 - 1.030    TEST NOT REPORTED DUE TO COLOR INTERFERENCE OF URINE PIGMENT   pH  5.0 - 8.0    TEST NOT REPORTED DUE TO COLOR INTERFERENCE OF URINE PIGMENT   Glucose, UA (A) NEGATIVE mg/dL    TEST NOT REPORTED DUE TO COLOR INTERFERENCE OF URINE PIGMENT   Hgb urine dipstick (A) NEGATIVE    TEST NOT REPORTED DUE TO COLOR INTERFERENCE OF URINE PIGMENT   Bilirubin Urine (A) NEGATIVE  TEST NOT REPORTED DUE TO COLOR INTERFERENCE OF URINE PIGMENT   Ketones, ur (A) NEGATIVE mg/dL    TEST NOT REPORTED DUE TO COLOR INTERFERENCE OF URINE PIGMENT   Protein, ur (A) NEGATIVE mg/dL    TEST NOT REPORTED DUE TO COLOR INTERFERENCE OF URINE PIGMENT   Nitrite (A) NEGATIVE    TEST NOT REPORTED DUE TO COLOR INTERFERENCE OF URINE PIGMENT   Leukocytes,Ua (A) NEGATIVE    TEST  NOT REPORTED DUE TO COLOR INTERFERENCE OF URINE PIGMENT  Urinalysis, Microscopic (reflex)     Status: Abnormal   Collection Time: 02/14/22 10:35 PM  Result Value Ref Range   RBC / HPF >50 0 - 5 RBC/hpf   WBC, UA 6-10 0 - 5 WBC/hpf   Bacteria, UA RARE (A) NONE SEEN   Squamous Epithelial / HPF NONE SEEN 0 - 5 /HPF  CBC with Differential     Status: Abnormal   Collection Time: 02/14/22 11:22 PM  Result Value Ref Range   WBC 5.5 4.0 - 10.5 K/uL   RBC 4.02 (L) 4.22 - 5.81 MIL/uL   Hemoglobin 13.2 13.0 - 17.0 g/dL   HCT 39.6 39.0 - 52.0 %   MCV 98.5 80.0 - 100.0 fL   MCH 32.8 26.0 - 34.0 pg   MCHC 33.3 30.0 - 36.0 g/dL   RDW 11.6 11.5 - 15.5 %   Platelets 249 150 - 400 K/uL   nRBC 0.0 0.0 - 0.2 %   Neutrophils Relative % 56 %   Neutro Abs 3.1 1.7 - 7.7 K/uL   Lymphocytes Relative 32 %   Lymphs Abs 1.8 0.7 - 4.0 K/uL   Monocytes Relative 9 %   Monocytes Absolute 0.5 0.1 - 1.0 K/uL   Eosinophils Relative 3 %   Eosinophils Absolute 0.1 0.0 - 0.5 K/uL   Basophils Relative 0 %   Basophils Absolute 0.0 0.0 - 0.1 K/uL   Immature Granulocytes 0 %   Abs Immature Granulocytes 0.01 0.00 - 0.07 K/uL  Basic metabolic panel     Status: Abnormal   Collection Time: 02/14/22 11:22 PM  Result Value Ref Range   Sodium 137 135 - 145 mmol/L   Potassium 3.9 3.5 - 5.1 mmol/L   Chloride 104 98 - 111 mmol/L   CO2 24 22 - 32 mmol/L   Glucose, Bld 114 (H) 70 - 99 mg/dL   BUN 28 (H) 6 - 20 mg/dL   Creatinine, Ser 1.25 (H) 0.61 - 1.24 mg/dL   Calcium 8.4 (L) 8.9 - 10.3 mg/dL   GFR, Estimated >60 >60 mL/min   Anion gap 9 5 - 15   No results found for this or any previous visit (from the past 240 hour(s)).  Renal Function: Recent Labs    02/14/22 2322  CREATININE 1.25*   Estimated Creatinine Clearance: 91.9 mL/min (A) (by C-G formula based on SCr of 1.25 mg/dL (H)).  Radiologic Imaging: US RENAL  Result Date: 02/14/2022 CLINICAL DATA:  Flank pain. Recent stent removal. History of kidney stones  EXAM: RENAL / URINARY TRACT ULTRASOUND COMPLETE COMPARISON:  CT 02/04/2022 FINDINGS: Right Kidney: Renal measurements: 14.3 x 6.3 x 5.7 cm = volume: 269 mL. Moderate hydronephrosis. Upper pole cyst measures 11 mm. Multiple stones. Lower pole stone measures 11 mm. Normal echotexture. Left Kidney: Renal measurements: 12.2 x 5.1 x 4.7 cm = volume: 153 mL. Mild hydronephrosis. Multiple stones. The largest 10 mm. Normal echotexture. Bladder: Appears normal for degree of bladder distention. Other: None. IMPRESSION: Bilateral nephrolithiasis.  Moderate right hydronephrosis and mild left hydronephrosis. Electronically Signed   By: Rolm Baptise M.D.   On: 02/14/2022 23:21    Impression/Assessment:  Renal colic status post recent bilateral ureteroscopy and laser lithotripsy and recent stent removal  Plan:  Observation admit for hydration pain control.  If patient continues to require significant pain medicine will repeat CT imaging to exclude stone fragment retention in ureter  Remi Haggard 02/15/2022, 6:40 AM  Meriam Sprague

## 2022-02-16 ENCOUNTER — Observation Stay (HOSPITAL_COMMUNITY): Payer: Commercial Managed Care - PPO

## 2022-02-16 ENCOUNTER — Encounter (HOSPITAL_COMMUNITY): Payer: Self-pay | Admitting: Urology

## 2022-02-16 DIAGNOSIS — Z765 Malingerer [conscious simulation]: Secondary | ICD-10-CM | POA: Diagnosis not present

## 2022-02-16 DIAGNOSIS — K219 Gastro-esophageal reflux disease without esophagitis: Secondary | ICD-10-CM | POA: Diagnosis present

## 2022-02-16 DIAGNOSIS — R0602 Shortness of breath: Secondary | ICD-10-CM | POA: Diagnosis present

## 2022-02-16 DIAGNOSIS — E7201 Cystinuria: Secondary | ICD-10-CM | POA: Diagnosis present

## 2022-02-16 DIAGNOSIS — Z87442 Personal history of urinary calculi: Secondary | ICD-10-CM | POA: Diagnosis not present

## 2022-02-16 DIAGNOSIS — G8918 Other acute postprocedural pain: Secondary | ICD-10-CM | POA: Diagnosis present

## 2022-02-16 DIAGNOSIS — Z8616 Personal history of COVID-19: Secondary | ICD-10-CM | POA: Diagnosis not present

## 2022-02-16 DIAGNOSIS — N1330 Unspecified hydronephrosis: Secondary | ICD-10-CM | POA: Diagnosis present

## 2022-02-16 DIAGNOSIS — R609 Edema, unspecified: Secondary | ICD-10-CM | POA: Diagnosis present

## 2022-02-16 DIAGNOSIS — Z79899 Other long term (current) drug therapy: Secondary | ICD-10-CM | POA: Diagnosis not present

## 2022-02-16 DIAGNOSIS — N132 Hydronephrosis with renal and ureteral calculous obstruction: Secondary | ICD-10-CM | POA: Diagnosis present

## 2022-02-16 MED ORDER — KETOROLAC TROMETHAMINE 60 MG/2ML IM SOLN
60.0000 mg | Freq: Once | INTRAMUSCULAR | Status: AC
  Administered 2022-02-16: 60 mg via INTRAMUSCULAR
  Filled 2022-02-16: qty 2

## 2022-02-16 MED ORDER — SODIUM CHLORIDE 0.9 % IV SOLN: INTRAVENOUS | Status: DC

## 2022-02-16 MED ORDER — KETOROLAC TROMETHAMINE 15 MG/ML IJ SOLN
15.0000 mg | Freq: Four times a day (QID) | INTRAMUSCULAR | Status: DC
  Administered 2022-02-17 – 2022-02-19 (×11): 15 mg via INTRAVENOUS
  Filled 2022-02-16 (×11): qty 1

## 2022-02-16 MED ORDER — OXYCODONE HCL 5 MG PO TABS
10.0000 mg | ORAL_TABLET | ORAL | Status: DC | PRN
  Administered 2022-02-16 – 2022-02-17 (×4): 10 mg via ORAL
  Filled 2022-02-16 (×4): qty 2

## 2022-02-16 MED ORDER — ACETAMINOPHEN 500 MG PO TABS
1000.0000 mg | ORAL_TABLET | Freq: Three times a day (TID) | ORAL | Status: DC
  Administered 2022-02-16 – 2022-02-19 (×8): 1000 mg via ORAL
  Filled 2022-02-16 (×8): qty 2

## 2022-02-16 NOTE — Progress Notes (Addendum)
  Subjective: Patient continues with severe bilateral lower abdominal and flank pain.  Requiring Dilaudid and Toradol.  Significant nausea vomiting no fever.  Pain level 8 out of 10 by his report.  Objective: Vital signs in last 24 hours: Temp:  [97.5 F (36.4 C)-98.7 F (37.1 C)] 97.8 F (36.6 C) (02/11 0702) Pulse Rate:  [63-85] 75 (02/11 0702) Resp:  [9-22] 12 (02/10 2104) BP: (122-146)/(76-99) 139/93 (02/11 0702) SpO2:  [94 %-96 %] 94 % (02/11 0702)  Intake/Output from previous day: 02/10 0701 - 02/11 0700 In: 2388.7 [P.O.:360; I.V.:2028.7] Out: 3650 [Urine:3650] Intake/Output this shift: Total I/O In: -  Out: 625 [Urine:625]  Physical Exam:  General: Alert and oriented  Abdomen: Soft, diffuse lower abdominal tenderness no CVA tenderness Incisions: Ext: NT, No erythema  Lab Results: Recent Labs    02/14/22 2322  HGB 13.2  HCT 39.6   BMET Recent Labs    02/14/22 2322  NA 137  K 3.9  CL 104  CO2 24  GLUCOSE 114*  BUN 28*  CREATININE 1.25*  CALCIUM 8.4*     Studies/Results: US RENAL  Result Date: 02/14/2022 CLINICAL DATA:  Flank pain. Recent stent removal. History of kidney stones EXAM: RENAL / URINARY TRACT ULTRASOUND COMPLETE COMPARISON:  CT 02/04/2022 FINDINGS: Right Kidney: Renal measurements: 14.3 x 6.3 x 5.7 cm = volume: 269 mL. Moderate hydronephrosis. Upper pole cyst measures 11 mm. Multiple stones. Lower pole stone measures 11 mm. Normal echotexture. Left Kidney: Renal measurements: 12.2 x 5.1 x 4.7 cm = volume: 153 mL. Mild hydronephrosis. Multiple stones. The largest 10 mm. Normal echotexture. Bladder: Appears normal for degree of bladder distention. Other: None. IMPRESSION: Bilateral nephrolithiasis. Moderate right hydronephrosis and mild left hydronephrosis. Electronically Signed   By: Rolm Baptise M.D.   On: 02/14/2022 23:21    Assessment/Plan: 1.  Continued significant Donnell pain status post bilateral ureteroscopy with recent removal of  bilateral JJ stents /Recommendation.  Will order CT urogram this a.m. to exclude stone fragments.  Keep n.p.o. until results known.  Addendum:    Some hydronephrosis which is likely secondary to ureteral edema from prior procedure on both sides.  Do not feel stent indicated but will attempt pain management today and fluid hydration.  LOS: 0 days   Devin Sanders 02/16/2022, 8:00 AM

## 2022-02-16 NOTE — Plan of Care (Signed)

## 2022-02-17 MED ORDER — DIAZEPAM 5 MG PO TABS
10.0000 mg | ORAL_TABLET | Freq: Two times a day (BID) | ORAL | Status: DC
  Administered 2022-02-17 (×2): 10 mg via ORAL
  Filled 2022-02-17 (×2): qty 2

## 2022-02-17 NOTE — Plan of Care (Signed)
  Problem: Activity: Goal: Risk for activity intolerance will decrease Outcome: Progressing   Problem: Elimination: Goal: Will not experience complications related to bowel motility Outcome: Progressing   Problem: Pain Managment: Goal: General experience of comfort will improve Outcome: Not Progressing   Problem: Safety: Goal: Ability to remain free from injury will improve Outcome: Progressing

## 2022-02-17 NOTE — Plan of Care (Signed)
  Problem: Pain Managment: Goal: General experience of comfort will improve Outcome: Not Progressing   Problem: Safety: Goal: Ability to remain free from injury will improve Outcome: Adequate for Discharge   Problem: Clinical Measurements: Goal: Will remain free from infection Outcome: Completed/Met

## 2022-02-17 NOTE — Plan of Care (Signed)
  Problem: Activity: Goal: Risk for activity intolerance will decrease Outcome: Progressing   Problem: Elimination: Goal: Will not experience complications related to bowel motility Outcome: Progressing   Problem: Pain Managment: Goal: General experience of comfort will improve Outcome: Progressing   Problem: Safety: Goal: Ability to remain free from injury will improve Outcome: Progressing   

## 2022-02-17 NOTE — Progress Notes (Signed)
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 Most recently s/p uncomplicated bilateral ureteroscopy 02/12/22 where all intraluminal stone removed (some parenchymal stone of course remains).   2 - Pain After Ureteroscopy, Medication Seeking Tendencies - pt readmitted 2/10 for pain after ureteroscopy. He has had this doen literally over 60 times and for past year been very prone to seek re-admission for "narcotic pain management" despite no hydro / obstructive fragemtns / infectious parameters or objective findings justifying admission. I have spoke with his wife about this and she is also concerned.   Today " Devin Sanders" states he is still in pain and does not want to go home. He is on tylnol, toradol, high dose PO oxycodone, IV dilaudid. AFVSS.    Objective: Vital signs in last 24 hours: Temp:  [97.7 F (36.5 C)-98.8 F (37.1 C)] 97.7 F (36.5 C) (02/12 0623) Pulse Rate:  [82-95] 82 (02/12 0623) Resp:  [20] 20 (02/12 0623) BP: (110-136)/(75-97) 110/75 (02/12 0623) SpO2:  [95 %-97 %] 95 % (02/12 0623) Last BM Date : 02/14/22  Intake/Output from previous day: 02/11 0701 - 02/12 0700 In: 2352 [P.O.:960; I.V.:1392] Out: 4000 [Urine:4000] Intake/Output this shift: No intake/output data recorded.  NAD, at baseline Non-labored breathing on RA Stable mild obesityi. NO CVAT RRR No c/c/e No GU tubes.   Lab Results:  Recent Labs    02/14/22 2322  WBC 5.5  HGB 13.2  HCT 39.6  PLT 249   BMET Recent Labs    02/14/22 2322  NA 137  K 3.9  CL 104  CO2 24  GLUCOSE 114*  BUN 28*  CREATININE 1.25*  CALCIUM 8.4*   PT/INR No results for input(s): "LABPROT", "INR" in the last 72 hours. ABG No results for input(s): "PHART", "HCO3" in the last 72 hours.  Invalid input(s): "PCO2", "PO2"  Studies/Results: CT RENAL STONE STUDY  Result Date: 02/16/2022 CLINICAL DATA:  Bilateral removal of nephroureteral stents. EXAM: CT  ABDOMEN AND PELVIS WITHOUT CONTRAST TECHNIQUE: Multidetector CT imaging of the abdomen and pelvis was performed following the standard protocol without IV contrast. RADIATION DOSE REDUCTION: This exam was performed according to the departmental dose-optimization program which includes automated exposure control, adjustment of the mA and/or kV according to patient size and/or use of iterative reconstruction technique. COMPARISON:  02/04/2022 FINDINGS: Lower chest: Bibasilar atelectasis. Hepatobiliary: No focal liver abnormality is seen. No gallstones, gallbladder wall thickening, or biliary dilatation. Pancreas: Unremarkable. No pancreatic ductal dilatation or surrounding inflammatory changes. Spleen: Normal in size without focal abnormality. Adrenals/Urinary Tract: Adrenal glands are unremarkable. Recent bilateral nephroureteral stent removal. Mild right hydronephrosis with urothelial thickening and periureteral inflammatory changes which may reflect residual inflammation from the recent stent removal versus an infectious etiology. Bilateral nonobstructing nephrolithiasis. No left obstructive uropathy. Left inferior pole renal scarring. Decompressed bladder. Stomach/Bowel: Stomach is within normal limits. No evidence of bowel wall thickening, distention, or inflammatory changes. Appendix is normal. Vascular/Lymphatic: No significant vascular findings are present. No enlarged abdominal or pelvic lymph nodes. Reproductive: Prostate is unremarkable. Other: Bilateral fat containing inguinal hernias.  No ascites. Musculoskeletal: No acute osseous abnormality. No aggressive osseous lesion. IMPRESSION: 1. Recent bilateral nephroureteral stent removal. Mild right hydronephrosis with urothelial thickening and periureteral inflammatory changes which may reflect residual inflammation from the recent stent removal versus an infectious etiology. Correlate with urinalysis. 2. Bilateral nonobstructing nephrolithiasis.  Electronically Signed   By: Kathreen Devoid M.D.   On: 02/16/2022 09:43    Anti-infectives: Anti-infectives (  From admission, onward)    None       Assessment/Plan:  Add low dose diazepam. He is OK for discharge by obhjective criteria at anypoint.   Alexis Frock 02/17/2022

## 2022-02-18 MED ORDER — DIAZEPAM 5 MG PO TABS
10.0000 mg | ORAL_TABLET | Freq: Four times a day (QID) | ORAL | Status: DC
  Administered 2022-02-18 – 2022-02-19 (×5): 10 mg via ORAL
  Filled 2022-02-18 (×6): qty 2

## 2022-02-18 NOTE — Progress Notes (Signed)
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 Most recently s/p uncomplicated bilateral ureteroscopy 02/12/22 where all intraluminal stone removed (some parenchymal stone of course remains).   2 - Pain After Ureteroscopy, Medication Seeking Tendencies - pt readmitted 2/10 for pain after ureteroscopy. He has had this doen literally over 60 times and for past year been very prone to seek re-admission for "narcotic pain management" despite no hydro / obstructive fragemtns / infectious parameters or objective findings justifying admission. I have spoke with his wife about this and she is also concerned.   Today " Devin Sanders" states he is still in pain and does not want to go home. He thinks the diezepam helpwed some. He is on tylnol, toradol, high dose PO oxycodone, IV dilaudid, diazepam. AFVSS.    Objective: Vital signs in last 24 hours: Temp:  [97.8 F (36.6 C)-97.9 F (36.6 C)] 97.9 F (36.6 C) (02/13 0434) Pulse Rate:  [72-82] 82 (02/13 0434) Resp:  [16-20] 20 (02/13 0434) BP: (107-127)/(73-91) 107/91 (02/13 0434) SpO2:  [94 %-98 %] 95 % (02/13 0434) Last BM Date : 02/17/22  Intake/Output from previous day: 02/12 0701 - 02/13 0700 In: 3380 [P.O.:2160; I.V.:1220] Out: 2575 [Urine:2575] Intake/Output this shift: Total I/O In: -  Out: 350 [Urine:350]  NAD, at baseline Non-labored breathing on RA Stable mild obesityi. NO CVAT RRR No c/c/e No GU tubes.   Lab Results:  No results for input(s): "WBC", "HGB", "HCT", "PLT" in the last 72 hours. BMET No results for input(s): "NA", "K", "CL", "CO2", "GLUCOSE", "BUN", "CREATININE", "CALCIUM" in the last 72 hours. PT/INR No results for input(s): "LABPROT", "INR" in the last 72 hours. ABG No results for input(s): "PHART", "HCO3" in the last 72 hours.  Invalid input(s): "PCO2", "PO2"  Studies/Results: CT RENAL STONE STUDY  Result Date: 02/16/2022 CLINICAL DATA:  Bilateral  removal of nephroureteral stents. EXAM: CT ABDOMEN AND PELVIS WITHOUT CONTRAST TECHNIQUE: Multidetector CT imaging of the abdomen and pelvis was performed following the standard protocol without IV contrast. RADIATION DOSE REDUCTION: This exam was performed according to the departmental dose-optimization program which includes automated exposure control, adjustment of the mA and/or kV according to patient size and/or use of iterative reconstruction technique. COMPARISON:  02/04/2022 FINDINGS: Lower chest: Bibasilar atelectasis. Hepatobiliary: No focal liver abnormality is seen. No gallstones, gallbladder wall thickening, or biliary dilatation. Pancreas: Unremarkable. No pancreatic ductal dilatation or surrounding inflammatory changes. Spleen: Normal in size without focal abnormality. Adrenals/Urinary Tract: Adrenal glands are unremarkable. Recent bilateral nephroureteral stent removal. Mild right hydronephrosis with urothelial thickening and periureteral inflammatory changes which may reflect residual inflammation from the recent stent removal versus an infectious etiology. Bilateral nonobstructing nephrolithiasis. No left obstructive uropathy. Left inferior pole renal scarring. Decompressed bladder. Stomach/Bowel: Stomach is within normal limits. No evidence of bowel wall thickening, distention, or inflammatory changes. Appendix is normal. Vascular/Lymphatic: No significant vascular findings are present. No enlarged abdominal or pelvic lymph nodes. Reproductive: Prostate is unremarkable. Other: Bilateral fat containing inguinal hernias.  No ascites. Musculoskeletal: No acute osseous abnormality. No aggressive osseous lesion. IMPRESSION: 1. Recent bilateral nephroureteral stent removal. Mild right hydronephrosis with urothelial thickening and periureteral inflammatory changes which may reflect residual inflammation from the recent stent removal versus an infectious etiology. Correlate with urinalysis. 2. Bilateral  nonobstructing nephrolithiasis. Electronically Signed   By: Kathreen Devoid M.D.   On: 02/16/2022 09:43    Anti-infectives: Anti-infectives (From admission, onward)    None  Assessment/Plan:  Increase diazepam to QID, other meds stay same. Goals for DC discussed. Hopefully tomorrow. Again, he is objectively ready at anypoint, this is all about subjective discomfrot.   Alexis Frock 02/18/2022

## 2022-02-19 MED ORDER — DIAZEPAM 10 MG PO TABS: 10.0000 mg | ORAL_TABLET | Freq: Three times a day (TID) | ORAL | 0 refills | Status: DC | PRN

## 2022-02-19 NOTE — Progress Notes (Signed)
.   Transition of Care [facility]) Screening Note   Patient Details  Name: Devin Sanders Date of Birth: March 05, 1980   Transition of Care [facility]) CM/SW Contact:    Illene Regulus, LCSW Phone Number: 02/19/2022, 9:17 AM    Transition of Care Department [facility]) has reviewed patient and no TOC needs have been identified at this time. We will continue to monitor patient advancement through interdisciplinary progression rounds. If new patient transition needs arise, please place a TOC consult.

## 2022-02-19 NOTE — Discharge Summary (Signed)
Physician Discharge Summary  Patient ID: Devin Sanders MRN: [id number] DOB/AGE: 42-Jul-1982 42 y.o.  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 Most recently s/p uncomplicated bilateral ureteroscopy 02/12/22 where all intraluminal stone removed (some parenchymal stone of course remains).   2 - Pain After Ureteroscopy, Medication Seeking Tendencies - pt readmitted 2/10 for pain after ureteroscopy. He has had this doen literally over 60 times and for past year been very prone to seek re-admission for "narcotic pain management" despite no hydro / obstructive fragemtns / infectious parameters or objective findings justifying admission. I have spoke with his wife about this and she is also concerned.   By the late morning of 02/19/22, the day of discharge, he is ambulatory, pain controlled on PO pain meds + antispasmodics, and felt to be adequate for discharge.   Consults: None  Significant Diagnostic Studies: CT - no hydro or ureteral stone fragments  Treatments: pain managment  Discharge Exam: Blood pressure 121/85, pulse 72, temperature 97.6 F (36.4 C), temperature source Oral, resp. rate 18, height 5' 10"$  (1.778 m), weight 101.4 kg, SpO2 96 %.    NAD, AOx3, pleasant, at baseline Non-labored breathing on RA RRR SNTND, mild obesity, No CVAT No foley No c/c/e  Disposition:  There are no questions and answers to display.         Allergies as of 02/19/2022       Reactions   Morphine Nausea And Vomiting   Other Hives, Swelling   Mangos        Medication List     TAKE these medications    diazepam 10 MG tablet Commonly known as: VALIUM Take 1 tablet (10 mg total) by mouth every 8 (eight) hours  as needed for muscle spasms.   ondansetron 4 MG disintegrating tablet Commonly known as: ZOFRAN-ODT Take 1 tablet (4 mg total) by mouth every 8 (eight) hours as needed for nausea or vomiting.   oxyCODONE-acetaminophen 5-325 MG tablet Commonly known as: PERCOCET/ROXICET Take 1 tablet by mouth every 6 (six) hours as needed for severe pain.   tamsulosin 0.4 MG Caps capsule Commonly known as: FLOMAX Take 1 capsule (0.4 mg total) by mouth daily.   Thiola EC 300 MG Tbec Generic drug: Tiopronin Take 300 mg by mouth in the morning, at noon, and at bedtime.         Signed: Alexis Frock 02/19/2022, 8:21 AM

## 2022-02-19 NOTE — Discharge Instructions (Signed)
1 - You may have urinary urgency (bladder spasms) and bloody urine on / off for up to 10 days. This is normal.  2 - Call MD or go to ER for fever >102, severe pain / nausea / vomiting not relieved by medications, or acute change in medical status

## 2022-02-19 NOTE — Plan of Care (Signed)
  Problem: Activity: Goal: Risk for activity intolerance will decrease Outcome: Adequate for Discharge   Problem: Pain Managment: Goal: General experience of comfort will improve Outcome: Adequate for Discharge   Problem: Safety: Goal: Ability to remain free from injury will improve Outcome: Adequate for Discharge

## 2022-03-09 ENCOUNTER — Other Ambulatory Visit: Payer: Self-pay

## 2022-03-09 ENCOUNTER — Encounter (HOSPITAL_COMMUNITY): Payer: Self-pay

## 2022-03-09 ENCOUNTER — Emergency Department (HOSPITAL_COMMUNITY): Payer: Commercial Managed Care - PPO

## 2022-03-09 ENCOUNTER — Emergency Department (HOSPITAL_COMMUNITY)
Admission: EM | Admit: 2022-03-09 | Discharge: 2022-03-09 | Disposition: A | Discharge status: Home / Self Care | Payer: Commercial Managed Care - PPO | Attending: Emergency Medicine | Admitting: Emergency Medicine

## 2022-03-09 DIAGNOSIS — R10A2 Flank pain, left side: Secondary | ICD-10-CM

## 2022-03-09 DIAGNOSIS — N200 Calculus of kidney: Secondary | ICD-10-CM | POA: Diagnosis not present

## 2022-03-09 DIAGNOSIS — R109 Unspecified abdominal pain: Secondary | ICD-10-CM | POA: Diagnosis present

## 2022-03-09 LAB — COMPREHENSIVE METABOLIC PANEL
ALT: 23 U/L (ref 0–44)
AST: 25 U/L (ref 15–41)
Albumin: 4.3 g/dL (ref 3.5–5.0)
Alkaline Phosphatase: 82 U/L (ref 38–126)
Anion gap: 6 (ref 5–15)
BUN: 20 mg/dL (ref 6–20)
CO2: 23 mmol/L (ref 22–32)
Calcium: 9 mg/dL (ref 8.9–10.3)
Chloride: 106 mmol/L (ref 98–111)
Creatinine, Ser: 1.03 mg/dL (ref 0.61–1.24)
GFR, Estimated: >60 mL/min (ref >60)
Glucose, Bld: 91 mg/dL (ref 70–99)
Potassium: 3.7 mmol/L (ref 3.5–5.1)
Sodium: 135 mmol/L (ref 135–145)
Total Bilirubin: 0.7 mg/dL (ref 0.3–1.2)
Total Protein: 7.3 g/dL (ref 6.5–8.1)

## 2022-03-09 LAB — CBC WITH DIFFERENTIAL/PLATELET
Abs Immature Granulocytes: 0.01 10*3/uL (ref 0.00–0.07)
Basophils Absolute: 0 10*3/uL (ref 0.0–0.1)
Basophils Relative: 0 %
Eosinophils Absolute: 0.1 10*3/uL (ref 0.0–0.5)
Eosinophils Relative: 1 %
HCT: 39.6 % (ref 39.0–52.0)
Hemoglobin: 13.5 g/dL (ref 13.0–17.0)
Immature Granulocytes: 0 %
Lymphocytes Relative: 15 %
Lymphs Abs: 1.2 10*3/uL (ref 0.7–4.0)
MCH: 32.8 pg (ref 26.0–34.0)
MCHC: 34.1 g/dL (ref 30.0–36.0)
MCV: 96.1 fL (ref 80.0–100.0)
Monocytes Absolute: 0.6 10*3/uL (ref 0.1–1.0)
Monocytes Relative: 7 %
Neutro Abs: 5.8 10*3/uL (ref 1.7–7.7)
Neutrophils Relative %: 77 %
Platelets: 277 10*3/uL (ref 150–400)
RBC: 4.12 MIL/uL — ABNORMAL LOW (ref 4.22–5.81)
RDW: 12 % (ref 11.5–15.5)
WBC: 7.7 10*3/uL (ref 4.0–10.5)
nRBC: 0 % (ref 0.0–0.2)

## 2022-03-09 LAB — URINALYSIS, ROUTINE W REFLEX MICROSCOPIC
Bilirubin Urine: NEGATIVE
Glucose, UA: NEGATIVE mg/dL
Hgb urine dipstick: NEGATIVE
Ketones, ur: 5 mg/dL — AB
Leukocytes,Ua: NEGATIVE
Nitrite: NEGATIVE
Protein, ur: NEGATIVE mg/dL
Specific Gravity, Urine: 1.021 (ref 1.005–1.030)
pH: 6 (ref 5.0–8.0)

## 2022-03-09 LAB — LIPASE, BLOOD: Lipase: 38 U/L (ref 11–51)

## 2022-03-09 MED ORDER — KETOROLAC TROMETHAMINE 15 MG/ML IJ SOLN
15.0000 mg | Freq: Once | INTRAMUSCULAR | Status: AC
  Administered 2022-03-09: 15 mg via INTRAVENOUS
  Filled 2022-03-09: qty 1

## 2022-03-09 MED ORDER — METOCLOPRAMIDE HCL 5 MG/ML IJ SOLN
10.0000 mg | Freq: Once | INTRAMUSCULAR | Status: AC
  Administered 2022-03-09: 10 mg via INTRAVENOUS
  Filled 2022-03-09: qty 2

## 2022-03-09 MED ORDER — FENTANYL CITRATE PF 50 MCG/ML IJ SOSY
50.0000 ug | PREFILLED_SYRINGE | Freq: Once | INTRAMUSCULAR | Status: AC
  Administered 2022-03-09: 50 ug via INTRAVENOUS
  Filled 2022-03-09: qty 1

## 2022-03-09 MED ORDER — LACTATED RINGERS IV BOLUS
1000.0000 mL | Freq: Once | INTRAVENOUS | Status: AC
  Administered 2022-03-09: 1000 mL via INTRAVENOUS

## 2022-03-09 MED ORDER — LACTATED RINGERS IV BOLUS: 1000.0000 mL | Freq: Once | INTRAVENOUS | Status: DC

## 2022-03-09 NOTE — ED Triage Notes (Signed)
Patient has left sided flank pain for 2 days. No burning with urination. History of kidney stones.

## 2022-03-09 NOTE — Discharge Instructions (Signed)
Stay hydrated.  Continue tamsulosin.  Follow-up with your urologist as scheduled.  Return to emergency department for any new or worsening symptoms of concern.

## 2022-03-09 NOTE — ED Provider Notes (Signed)
EMERGENCY DEPARTMENT AT [facility] Provider Note   CSN: [id number] Arrival date & time: 03/09/22  1417     History  Chief Complaint  Patient presents with   Flank Pain    Devin Sanders is a 42 y.o. male.   Flank Pain  Patient presents for left flank pain.  Medical history includes GERD, nephrolithiasis, cystinuria.  He underwent bilateral ureteroscopy 4 weeks ago with removal of all intraluminal stones.  He had admission several days later for pain.  There was concern at that time of pain medication seeking behavior.  Today, patient reports left flank pain over the past 2 days.  Today, he developed nausea and vomiting.  He has taken ibuprofen at home with minimal relief of his pain.  He took a Zofran tablet earlier today but threw up a short time later.  Currently, endorses continued pain and nausea.  He denies any fever, chills, or dysuria     Home Medications Prior to Admission medications   Medication Sig Start Date End Date Taking? Authorizing Provider  diazepam (VALIUM) 10 MG tablet Take 1 tablet (10 mg total) by mouth every 8 (eight) hours as needed for muscle spasms. 02/19/22   Alexis Frock, MD  ondansetron (ZOFRAN-ODT) 4 MG disintegrating tablet Take 1 tablet (4 mg total) by mouth every 8 (eight) hours as needed for nausea or vomiting. 02/05/22   Sponseller, Eugene Garnet R, PA-C  oxyCODONE-acetaminophen (PERCOCET/ROXICET) 5-325 MG tablet Take 1 tablet by mouth every 6 (six) hours as needed for severe pain. 02/05/22   Sponseller, Gypsy Balsam, PA-C  tamsulosin (FLOMAX) 0.4 MG CAPS capsule Take 1 capsule (0.4 mg total) by mouth daily. 02/05/22   Sponseller, Gypsy Balsam, PA-C  THIOLA EC 300 MG TBEC Take 300 mg by mouth in the morning, at noon, and at bedtime. 08/30/21   [provider]      Allergies    Morphine and Other    Review of Systems   Review of Systems  Gastrointestinal:  Positive for nausea and vomiting.  Genitourinary:  Positive for  flank pain.  All other systems reviewed and are negative.   Physical Exam Updated Vital Signs BP (!) 148/97 (BP Location: Left Arm)   Pulse (!) 111   Temp 97.9 F (36.6 C) (Oral)   Resp 18   Ht '5\' 10"'$  (1.778 m)   Wt 97.1 kg   SpO2 97%   BMI 30.71 kg/m  Physical Exam Vitals and nursing note reviewed.  Constitutional:      General: He is not in acute distress.    Appearance: Normal appearance. He is well-developed. He is not ill-appearing, toxic-appearing or diaphoretic.  HENT:     Head: Normocephalic and atraumatic.     Right Ear: External ear normal.     Left Ear: External ear normal.     Nose: Nose normal.     Mouth/Throat:     Mouth: Mucous membranes are moist.  Eyes:     Extraocular Movements: Extraocular movements intact.     Conjunctiva/sclera: Conjunctivae normal.  Cardiovascular:     Rate and Rhythm: Normal rate and regular rhythm.     Heart sounds: No murmur heard. Pulmonary:     Effort: Pulmonary effort is normal. No respiratory distress.  Abdominal:     General: There is no distension.     Palpations: Abdomen is soft.     Tenderness: There is abdominal tenderness. There is no right CVA tenderness, left CVA tenderness, guarding or rebound.  Musculoskeletal:        General: No swelling. Normal range of motion.     Cervical back: Normal range of motion and neck supple.     Right lower leg: No edema.     Left lower leg: No edema.  Skin:    General: Skin is warm and dry.     Capillary Refill: Capillary refill takes less than 2 seconds.     Coloration: Skin is not jaundiced or pale.  Neurological:     General: No focal deficit present.     Mental Status: He is alert and oriented to person, place, and time.     Cranial Nerves: No cranial nerve deficit.     Sensory: No sensory deficit.     Motor: No weakness.     Coordination: Coordination normal.  Psychiatric:        Mood and Affect: Mood normal.        Behavior: Behavior normal.        Thought Content:  Thought content normal.        Judgment: Judgment normal.     ED Results / Procedures / Treatments   Labs (all labs ordered are listed, but only abnormal results are displayed) Labs Reviewed  CBC WITH DIFFERENTIAL/PLATELET - Abnormal; Notable for the following components:      Result Value   RBC 4.12 (*)    All other components within normal limits  URINALYSIS, ROUTINE W REFLEX MICROSCOPIC - Abnormal; Notable for the following components:   Ketones, ur 5 (*)    All other components within normal limits  COMPREHENSIVE METABOLIC PANEL  LIPASE, BLOOD    EKG None  Radiology CT RENAL STONE STUDY  Result Date: 03/09/2022 CLINICAL DATA:  Abdominal pain concern for kidney stone. EXAM: CT ABDOMEN AND PELVIS WITHOUT CONTRAST TECHNIQUE: Multidetector CT imaging of the abdomen and pelvis was performed following the standard protocol without IV contrast. RADIATION DOSE REDUCTION: This exam was performed according to the departmental dose-optimization program which includes automated exposure control, adjustment of the mA and/or kV according to patient size and/or use of iterative reconstruction technique. COMPARISON:  CT abdomen pelvis dated 02/16/2022. FINDINGS: Evaluation of this exam is limited in the absence of intravenous contrast. Lower chest: The visualized lung bases are clear. No intra-abdominal free air or free fluid. Hepatobiliary: No focal liver abnormality is seen. No gallstones, gallbladder wall thickening, or biliary dilatation. Pancreas: Unremarkable. No pancreatic ductal dilatation or surrounding inflammatory changes. Spleen: Normal in size without focal abnormality. Adrenals/Urinary Tract: The adrenal glands are unremarkable. There is a parenchyma atrophy and cortical scarring in the left kidney with lobulated renal cortex. Multiple bilateral nonobstructing renal calculi measure up to approximately 5 mm in the interpolar left kidney. There is no hydronephrosis on either side. The  visualized ureters and urinary bladder unremarkable. Stomach/Bowel: There is no bowel obstruction or active inflammation. The appendix is normal. Vascular/Lymphatic: The abdominal aorta and IVC are grossly unremarkable on this noncontrast CT. No portal venous gas. There is no adenopathy. Reproductive: The prostate and seminal vesicles are grossly unremarkable. No pelvic mass. Other: Small fat containing umbilical hernia as well as small fat containing bilateral inguinal hernia. Musculoskeletal: No acute or significant osseous findings. IMPRESSION: 1. Multiple bilateral nonobstructing renal calculi. No hydronephrosis. 2. No bowel obstruction. Normal appendix. Electronically Signed   By: Anner Crete M.D.   On: 03/09/2022 16:22    Procedures Procedures    Medications Ordered in ED Medications  fentaNYL (SUBLIMAZE) injection 50 mcg (  50 mcg Intravenous Given 03/09/22 1534)  metoCLOPramide (REGLAN) injection 10 mg (10 mg Intravenous Given 03/09/22 1534)  lactated ringers bolus 1,000 mL (0 mLs Intravenous Stopped 03/09/22 1719)  ketorolac (TORADOL) 15 MG/ML injection 15 mg (15 mg Intravenous Given 03/09/22 1655)    ED Course/ Medical Decision Making/ A&P                             Medical Decision Making Amount and/or Complexity of Data Reviewed Labs: ordered. Radiology: ordered.  Risk Prescription drug management.   This patient presents to the ED for concern of left flank pain, this involves an extensive number of treatment options, and is a complaint that carries with it a high risk of complications and morbidity.  The differential diagnosis includes nephrolithiasis, UTI, diverticulitis, colitis, drug-seeking behavior   Co morbidities that complicate the patient evaluation  GERD, prior episodes of nephrolithiasis   Additional history obtained:  Additional history obtained from N/A External records from outside source obtained and reviewed including EMR   Lab Tests:  I Ordered,  and personally interpreted labs.  The pertinent results include: No evidence of hematuria or UTI.  Normal hemoglobin, no leukocytosis, normal kidney function, normal electrolytes   Imaging Studies ordered:  I ordered imaging studies including CT stone study I independently visualized and interpreted imaging which showed bilateral nonobstructing renal calculi.  No evidence of obstructive uropathy.  No acute findings to explain his recent symptoms. I agree with the radiologist interpretation   Cardiac Monitoring: / EKG:  The patient was maintained on a cardiac monitor.  I personally viewed and interpreted the cardiac monitored which showed an underlying rhythm of: Sinus rhythm  Problem List / ED Course / Critical interventions / Medication management  Patient presents for left flank pain over the past 2 days.  Today he developed nausea and vomiting.  He feels that symptoms are consistent with prior episodes of kidney stones.  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  On arrival in the ED, he has a Ziploc bag containing stones that he states that he passed yesterday.  He denies any systemic symptoms.  He is well-appearing on exam.  Abdomen is soft.  He does endorse tenderness in left lower quadrant.  IV fluids, pain medication, and antiemetic were ordered.  Lab work and imaging studies were ordered.  Lab work is reassuring.  CT imaging did not show any obstructive uropathy.  Urinalysis showed no evidence of UTI or hematuria.  On reassessment, patient resting comfortably.  He was discharged in good condition. I ordered medication including fentanyl and Toradol for analgesia; Reglan for nausea; IV fluids for hydration Reevaluation of the patient after these medicines showed that the patient improved I have reviewed the patients home medicines and have made adjustments as needed   Social Determinants of Health:  Has access to outpatient  care         Final Clinical Impression(s) / ED Diagnoses Final diagnoses:  Left flank pain    Rx / DC Orders ED Discharge Orders     None         Godfrey Pick, MD 03/09/22 1726

## 2022-05-03 ENCOUNTER — Emergency Department (HOSPITAL_COMMUNITY): Payer: Commercial Managed Care - PPO

## 2022-05-03 ENCOUNTER — Encounter (HOSPITAL_COMMUNITY): Payer: Self-pay

## 2022-05-03 ENCOUNTER — Emergency Department (HOSPITAL_COMMUNITY)
Admission: EM | Admit: 2022-05-03 | Discharge: 2022-05-04 | Disposition: A | Discharge status: Home / Self Care | Payer: Commercial Managed Care - PPO | Attending: Emergency Medicine | Admitting: Emergency Medicine

## 2022-05-03 DIAGNOSIS — R109 Unspecified abdominal pain: Secondary | ICD-10-CM

## 2022-05-03 DIAGNOSIS — R112 Nausea with vomiting, unspecified: Secondary | ICD-10-CM | POA: Insufficient documentation

## 2022-05-03 DIAGNOSIS — R1032 Left lower quadrant pain: Secondary | ICD-10-CM | POA: Insufficient documentation

## 2022-05-03 LAB — BASIC METABOLIC PANEL
Anion gap: 12 (ref 5–15)
BUN: 22 mg/dL — ABNORMAL HIGH (ref 6–20)
CO2: 21 mmol/L — ABNORMAL LOW (ref 22–32)
Calcium: 9 mg/dL (ref 8.9–10.3)
Chloride: 102 mmol/L (ref 98–111)
Creatinine, Ser: 1.07 mg/dL (ref 0.61–1.24)
GFR, Estimated: >60 mL/min (ref >60)
Glucose, Bld: 103 mg/dL — ABNORMAL HIGH (ref 70–99)
Potassium: 3.9 mmol/L (ref 3.5–5.1)
Sodium: 135 mmol/L (ref 135–145)

## 2022-05-03 LAB — URINALYSIS, ROUTINE W REFLEX MICROSCOPIC
Bilirubin Urine: NEGATIVE
Glucose, UA: NEGATIVE mg/dL
Hgb urine dipstick: NEGATIVE
Ketones, ur: NEGATIVE mg/dL
Leukocytes,Ua: NEGATIVE
Nitrite: NEGATIVE
Protein, ur: NEGATIVE mg/dL
Specific Gravity, Urine: 1.025 (ref 1.005–1.030)
pH: 7 (ref 5.0–8.0)

## 2022-05-03 LAB — CBC
HCT: 42.6 % (ref 39.0–52.0)
Hemoglobin: 14.7 g/dL (ref 13.0–17.0)
MCH: 32.8 pg (ref 26.0–34.0)
MCHC: 34.5 g/dL (ref 30.0–36.0)
MCV: 95.1 fL (ref 80.0–100.0)
Platelets: 251 10*3/uL (ref 150–400)
RBC: 4.48 MIL/uL (ref 4.22–5.81)
RDW: 11.6 % (ref 11.5–15.5)
WBC: 5.6 10*3/uL (ref 4.0–10.5)
nRBC: 0 % (ref 0.0–0.2)

## 2022-05-03 NOTE — ED Triage Notes (Addendum)
Pt arrived POC for c/o kidney stones, has hx of same, pt c/o left flank pain, denies urinary symptoms with vomiting. Reports has already passed several fragments, pt has a small bag of "stones" with him. A&O x4, NAD noted.

## 2022-05-04 MED ORDER — KETOROLAC TROMETHAMINE 30 MG/ML IJ SOLN
30.0000 mg | Freq: Once | INTRAMUSCULAR | Status: AC
  Administered 2022-05-04: 30 mg via INTRAMUSCULAR
  Filled 2022-05-04: qty 1

## 2022-05-04 MED ORDER — ONDANSETRON 8 MG PO TBDP
8.0000 mg | ORAL_TABLET | Freq: Once | ORAL | Status: AC
  Administered 2022-05-04: 8 mg via ORAL
  Filled 2022-05-04: qty 1

## 2022-05-04 NOTE — Discharge Instructions (Signed)
Follow up with your urologist.   Your CT does not show any stones in the ureter or other acute findings to explain your pain tonight. Your labs are reassuring.

## 2022-05-04 NOTE — ED Provider Notes (Signed)
[place] EMERGENCY DEPARTMENT AT [facility] Provider Note   CSN: [id number] Arrival date & time: 05/03/22  2059     History  Chief Complaint  Patient presents with   Flank Pain    Devin Sanders is a 42 y.o. male.  42 year old male presents with complaint of left flank pain onset yesterday associated with nausea and vomiting.  Reports history of kidney stones, followed by Dr. Urban Gibson and states that he has to have stones removed through his urethra every 3 months or so.  Symptoms similar to prior.  Denies changes in bowel or bladder habits, fevers, chills.  Patient has a Ziploc bag with him with "fragments of stone" per patient.       Home Medications Prior to Admission medications   Medication Sig Start Date End Date Taking? Authorizing Provider  diazepam (VALIUM) 10 MG tablet Take 1 tablet (10 mg total) by mouth every 8 (eight) hours as needed for muscle spasms. 02/19/22   Loletta Parish., MD  ondansetron (ZOFRAN-ODT) 4 MG disintegrating tablet Take 1 tablet (4 mg total) by mouth every 8 (eight) hours as needed for nausea or vomiting. 02/05/22   Sponseller, Eugene Gavia, PA-C  oxyCODONE-acetaminophen (PERCOCET/ROXICET) 5-325 MG tablet Take 1 tablet by mouth every 6 (six) hours as needed for severe pain. 02/05/22   Sponseller, Eugene Gavia, PA-C  tamsulosin (FLOMAX) 0.4 MG CAPS capsule Take 1 capsule (0.4 mg total) by mouth daily. 02/05/22   Sponseller, Eugene Gavia, PA-C  THIOLA EC 300 MG TBEC Take 300 mg by mouth in the morning, at noon, and at bedtime. 08/30/21   [provider]      Allergies    Morphine and Other    Review of Systems   Review of Systems Negative except as per HPI Physical Exam Updated Vital Signs BP (!) 158/101   Pulse 79   Temp 98.1 F (36.7 C) (Oral)   Resp 18   Ht 5\' 10"  (1.778 m)   Wt 95.3 kg   SpO2 93%   BMI 30.13 kg/m  Physical Exam Vitals and nursing note reviewed.  Constitutional:      General: He is not in acute  distress.    Appearance: He is well-developed. He is not diaphoretic.  HENT:     Head: Normocephalic and atraumatic.  Cardiovascular:     Rate and Rhythm: Normal rate and regular rhythm.     Heart sounds: Normal heart sounds.  Pulmonary:     Effort: Pulmonary effort is normal.     Breath sounds: Normal breath sounds.  Abdominal:     Palpations: Abdomen is soft.     Tenderness: There is abdominal tenderness in the left upper quadrant and left lower quadrant. There is left CVA tenderness. There is no right CVA tenderness.  Skin:    General: Skin is warm and dry.     Findings: No erythema or rash.  Neurological:     Mental Status: He is alert and oriented to person, place, and time.  Psychiatric:        Behavior: Behavior normal.     ED Results / Procedures / Treatments   Labs (all labs ordered are listed, but only abnormal results are displayed) Labs Reviewed  BASIC METABOLIC PANEL - Abnormal; Notable for the following components:      Result Value   CO2 21 (*)    Glucose, Bld 103 (*)    BUN 22 (*)    All other components within  normal limits  CBC  URINALYSIS, ROUTINE W REFLEX MICROSCOPIC    EKG None  Radiology CT Renal Stone Study  Result Date: 05/04/2022 CLINICAL DATA:  Flank pain EXAM: CT ABDOMEN AND PELVIS WITHOUT CONTRAST TECHNIQUE: Multidetector CT imaging of the abdomen and pelvis was performed following the standard protocol without IV contrast. RADIATION DOSE REDUCTION: This exam was performed according to the departmental dose-optimization program which includes automated exposure control, adjustment of the mA and/or kV according to patient size and/or use of iterative reconstruction technique. COMPARISON:  CT 03/09/2022 FINDINGS: Lower chest: Lung bases are clear Hepatobiliary: No focal liver abnormality is seen. No gallstones, gallbladder wall thickening, or biliary dilatation. Pancreas: Unremarkable. No pancreatic ductal dilatation or surrounding inflammatory  changes. Spleen: Normal in size without focal abnormality. Adrenals/Urinary Tract: Adrenal glands are normal. Kidneys show no hydronephrosis. Multiple nonobstructing left kidney stones measuring up to 4 mm in size. Bladder is unremarkable Stomach/Bowel: Stomach is within normal limits. Appendix appears normal. No evidence of bowel wall thickening, distention, or inflammatory changes. Vascular/Lymphatic: No significant vascular findings are present. No enlarged abdominal or pelvic lymph nodes. Reproductive: Prostate is unremarkable. Other: Negative for pelvic effusion or free air. Small fat containing inguinal hernias. Musculoskeletal: No acute or significant osseous findings. IMPRESSION: 1. Multiple nonobstructing left kidney stones. Negative for hydronephrosis or hydroureter. 2. No CT evidence for acute intra-abdominal or pelvic abnormality. Electronically Signed   By: Jasmine Pang M.D.   On: 05/04/2022 00:39    Procedures Procedures    Medications Ordered in ED Medications  ketorolac (TORADOL) 30 MG/ML injection 30 mg (30 mg Intramuscular Given 05/04/22 0015)  ondansetron (ZOFRAN-ODT) disintegrating tablet 8 mg (8 mg Oral Given 05/04/22 0015)    ED Course/ Medical Decision Making/ A&P                             Medical Decision Making Amount and/or Complexity of Data Reviewed Labs: ordered.  Risk Prescription drug management.   This patient presents to the ED for concern of left flank pain, this involves an extensive number of treatment options, and is a complaint that carries with it a high risk of complications and morbidity.  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  02/12/22 uncomplicated bilateral ureteroscopy all intraluminal stone removed (some praenchymal stone of course remains). Medication seeking tendencies.    Lab Tests:  I Ordered, and personally interpreted labs.  The pertinent results include: Urinalysis is normal.  BMP without significant findings.  CBC within normal notes.   Imaging Studies ordered:  I ordered imaging studies including CT stone study  I independently visualized and interpreted imaging which showed no ureteral stones I agree with the radiologist interpretation    Problem List / ED Course / Critical interventions / Medication management  42 year old male with left side abdominal pain, history of stones, concern for same. On exam, left side abdominal tenderness. Labs reassuring, CT negative for ureteral stone, pain improved with treatment provided. Advised to follow up with his urologist, return to ER as needed.  I ordered medication including toradol and zofran  for pain  Reevaluation of the patient after these medicines showed that the patient  improved I have reviewed the patients home medicines and have made adjustments as needed   Social Determinants of Health:  Sees urology   Test / Admission - Considered:  Stable for dc         Final Clinical Impression(s) / ED Diagnoses Final diagnoses:  Left flank pain    Rx / DC Orders ED Discharge Orders     None         Alden Hipp 05/04/22 0626    Nira Conn, MD 05/04/22 1929

## 2022-08-31 ENCOUNTER — Other Ambulatory Visit: Payer: Self-pay

## 2022-08-31 ENCOUNTER — Emergency Department (HOSPITAL_COMMUNITY): Payer: Commercial Managed Care - PPO

## 2022-08-31 ENCOUNTER — Emergency Department (HOSPITAL_COMMUNITY)
Admission: EM | Admit: 2022-08-31 | Discharge: 2022-08-31 | Disposition: A | Discharge status: Home / Self Care | Payer: Commercial Managed Care - PPO | Attending: Emergency Medicine | Admitting: Emergency Medicine

## 2022-08-31 DIAGNOSIS — R109 Unspecified abdominal pain: Secondary | ICD-10-CM | POA: Insufficient documentation

## 2022-08-31 LAB — BASIC METABOLIC PANEL
Anion gap: 11 (ref 5–15)
BUN: 20 mg/dL (ref 6–20)
CO2: 23 mmol/L (ref 22–32)
Calcium: 9 mg/dL (ref 8.9–10.3)
Chloride: 103 mmol/L (ref 98–111)
Creatinine, Ser: 1.03 mg/dL (ref 0.61–1.24)
GFR, Estimated: >60 mL/min (ref >60)
Glucose, Bld: 84 mg/dL (ref 70–99)
Potassium: 3.8 mmol/L (ref 3.5–5.1)
Sodium: 137 mmol/L (ref 135–145)

## 2022-08-31 LAB — CBC
HCT: 41.3 % (ref 39.0–52.0)
Hemoglobin: 14.3 g/dL (ref 13.0–17.0)
MCH: 33.6 pg (ref 26.0–34.0)
MCHC: 34.6 g/dL (ref 30.0–36.0)
MCV: 97.2 fL (ref 80.0–100.0)
Platelets: 247 10*3/uL (ref 150–400)
RBC: 4.25 MIL/uL (ref 4.22–5.81)
RDW: 11.4 % — ABNORMAL LOW (ref 11.5–15.5)
WBC: 6.2 10*3/uL (ref 4.0–10.5)
nRBC: 0 % (ref 0.0–0.2)

## 2022-08-31 LAB — URINALYSIS, ROUTINE W REFLEX MICROSCOPIC
Bilirubin Urine: NEGATIVE
Glucose, UA: NEGATIVE mg/dL
Hgb urine dipstick: NEGATIVE
Ketones, ur: NEGATIVE mg/dL
Leukocytes,Ua: NEGATIVE
Nitrite: NEGATIVE
Protein, ur: NEGATIVE mg/dL
Specific Gravity, Urine: 1.027 (ref 1.005–1.030)
pH: 6 (ref 5.0–8.0)

## 2022-08-31 MED ORDER — HYDROMORPHONE HCL 1 MG/ML IJ SOLN
1.0000 mg | Freq: Once | INTRAMUSCULAR | Status: AC
  Administered 2022-08-31: 1 mg via INTRAVENOUS
  Filled 2022-08-31: qty 1

## 2022-08-31 MED ORDER — OXYCODONE-ACETAMINOPHEN 5-325 MG PO TABS: 1.0000 | ORAL_TABLET | ORAL | 0 refills | Status: DC | PRN

## 2022-08-31 MED ORDER — ONDANSETRON 4 MG PO TBDP: 4.0000 mg | ORAL_TABLET | Freq: Three times a day (TID) | ORAL | 0 refills | Status: DC | PRN

## 2022-08-31 MED ORDER — ONDANSETRON HCL 4 MG/2ML IJ SOLN
4.0000 mg | Freq: Once | INTRAMUSCULAR | Status: AC
  Administered 2022-08-31: 4 mg via INTRAVENOUS
  Filled 2022-08-31: qty 2

## 2022-08-31 MED ORDER — DIPHENHYDRAMINE HCL 25 MG PO CAPS
25.0000 mg | ORAL_CAPSULE | Freq: Once | ORAL | Status: AC
  Administered 2022-08-31: 25 mg via ORAL
  Filled 2022-08-31: qty 1

## 2022-08-31 MED ORDER — KETOROLAC TROMETHAMINE 30 MG/ML IJ SOLN
30.0000 mg | Freq: Once | INTRAMUSCULAR | Status: AC
  Administered 2022-08-31: 30 mg via INTRAVENOUS
  Filled 2022-08-31: qty 1

## 2022-08-31 NOTE — ED Triage Notes (Signed)
Pt arrived via POV. C/o L flank pain for 4x days, passed mult stones then.  Hx of similar.  AOx4

## 2022-08-31 NOTE — ED Provider Notes (Signed)
[place] EMERGENCY DEPARTMENT AT [facility] Provider Note   CSN: [id number] Arrival date & time: 08/31/22  1347     History  Chief Complaint  Patient presents with   Flank Pain    Devin Sanders is a 42 y.o. male.  Patient complains of left-sided flank pain and left-sided abdominal pain.  Patient reports that he has a history of frequent kidney stones.  Patient is followed by Dr. Urban Gibson of urology.  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  Patient reports he has had to have multiple procedures due to stones in the past.  Patient is concerned that he could have a stone without obstruction.  Patient denies any fever or chills denies any abdominal pain  The history is provided by the patient. No language interpreter was used.  Flank Pain This is a new problem. The problem occurs constantly. Nothing relieves the symptoms. He has tried nothing for the symptoms.       Home Medications Prior to Admission medications   Medication Sig Start Date End Date Taking? Authorizing Provider  acetaminophen (TYLENOL) 325 MG tablet Take 650 mg by mouth every 6 (six) hours as needed for mild pain.   Yes [provider]  ibuprofen (ADVIL) 200 MG tablet Take 200 mg by mouth every 6 (six) hours as needed for fever.   Yes [provider]  ondansetron (ZOFRAN-ODT) 4 MG disintegrating tablet Take 1 tablet (4 mg total) by mouth every 8 (eight) hours as needed for nausea or vomiting. 08/31/22  Yes Elson Areas, PA-C  oxyCODONE-acetaminophen (PERCOCET) 5-325 MG tablet Take 1 tablet by mouth every 4 (four) hours as needed for severe pain. 08/31/22 08/31/23 Yes Elson Areas, PA-C  THIOLA EC 300 MG TBEC Take 300 mg by mouth in the morning, at noon, and at bedtime. 08/30/21  Yes [provider]      Allergies    Morphine and Other    Review of Systems   Review of Systems  Genitourinary:  Positive for flank pain.  All other systems  reviewed and are negative.   Physical Exam Updated Vital Signs BP (!) 143/100   Pulse 95   Temp 98.2 F (36.8 C) (Oral)   Resp 18   Ht 5\' 10"  (1.778 m)   Wt 95.7 kg   SpO2 97%   BMI 30.28 kg/m  Physical Exam Vitals and nursing note reviewed.  Constitutional:      General: He is not in acute distress.    Appearance: He is well-developed.  HENT:     Head: Normocephalic and atraumatic.  Eyes:     Conjunctiva/sclera: Conjunctivae normal.  Cardiovascular:     Rate and Rhythm: Normal rate and regular rhythm.     Heart sounds: No murmur heard. Pulmonary:     Effort: Pulmonary effort is normal. No respiratory distress.     Breath sounds: Normal breath sounds.  Abdominal:     Palpations: Abdomen is soft.     Tenderness: There is no abdominal tenderness.  Musculoskeletal:        General: Normal range of motion.  Skin:    General: Skin is warm and dry.     Capillary Refill: Capillary refill takes less than 2 seconds.  Neurological:     Mental Status: He is alert.  Psychiatric:        Mood and Affect: Mood normal.     ED Results / Procedures / Treatments  Labs (all labs ordered are listed, but only abnormal results are displayed) Labs Reviewed  CBC - Abnormal; Notable for the following components:      Result Value   RDW 11.4 (*)    All other components within normal limits  URINALYSIS, ROUTINE W REFLEX MICROSCOPIC  BASIC METABOLIC PANEL    EKG None  Radiology CT Renal Stone Study  Result Date: 08/31/2022 CLINICAL DATA:  Left flank pain.  Nephrolithiasis. EXAM: CT ABDOMEN AND PELVIS WITHOUT CONTRAST TECHNIQUE: Multidetector CT imaging of the abdomen and pelvis was performed following the standard protocol without IV contrast. RADIATION DOSE REDUCTION: This exam was performed according to the departmental dose-optimization program which includes automated exposure control, adjustment of the mA and/or kV according to patient size and/or use of iterative  reconstruction technique. COMPARISON:  None Available. FINDINGS: Lower chest: No acute findings. Hepatobiliary: No mass visualized on this unenhanced exam. Gallbladder is unremarkable. No evidence of biliary ductal dilatation. Pancreas: No mass or inflammatory process visualized on this unenhanced exam. Spleen:  Within normal limits in size. Adrenals/Urinary tract: Left renal parenchymal scarring again noted. Several tiny less than 5 mm left renal calculi are again seen. No evidence of ureteral calculi or hydronephrosis. Unremarkable unopacified urinary bladder. Stomach/Bowel: No evidence of obstruction, inflammatory process, or abnormal fluid collections. Normal appendix visualized. Vascular/Lymphatic: No pathologically enlarged lymph nodes identified. No evidence of abdominal aortic aneurysm. Reproductive:  No mass or other significant abnormality. Other:  None. Musculoskeletal:  No suspicious bone lesions identified. IMPRESSION: Tiny left renal calculi. No evidence of ureteral calculi, hydronephrosis, or other acute findings. Electronically Signed   By: Danae Orleans M.D.   On: 08/31/2022 15:46    Procedures Procedures    Medications Ordered in ED Medications  HYDROmorphone (DILAUDID) injection 1 mg (1 mg Intravenous Given 08/31/22 1513)  ondansetron (ZOFRAN) injection 4 mg (4 mg Intravenous Given 08/31/22 1513)  diphenhydrAMINE (BENADRYL) capsule 25 mg (25 mg Oral Given 08/31/22 1620)  ketorolac (TORADOL) 30 MG/ML injection 30 mg (30 mg Intravenous Given 08/31/22 1737)    ED Course/ Medical Decision Making/ A&P                                 Medical Decision Making Patient complains of left flank pain he is concerned that he has a stone with obstruction.  Amount and/or Complexity of Data Reviewed External Data Reviewed: notes.    Details: Allergy notes reviewed Labs: ordered. Decision-making details documented in ED Course.    Details: Shows no evidence of blood Radiology: ordered.     Details: CT scan ordered reviewed and interpreted.  Patient has a tiny left renal calculi no ureteral calculus or hydronephrosis  Risk Prescription drug management. Risk Details: Patient given IV pain medicine with relief of discomfort.  I discussed CT results with the patient.  I feel like he most likely has passed a stone given his history.  Patient is advised to follow-up with Dr. Berneice Heinrich recheck           Final Clinical Impression(s) / ED Diagnoses Final diagnoses:  Left flank pain    Rx / DC Orders ED Discharge Orders          Ordered    oxyCODONE-acetaminophen (PERCOCET) 5-325 MG tablet  Every 4 hours PRN,   Status:  Discontinued        08/31/22 1725    ondansetron (ZOFRAN-ODT) 4 MG disintegrating tablet  Every 8  hours PRN,   Status:  Discontinued        08/31/22 1725    oxyCODONE-acetaminophen (PERCOCET) 5-325 MG tablet  Every 4 hours PRN        08/31/22 1730    ondansetron (ZOFRAN-ODT) 4 MG disintegrating tablet  Every 8 hours PRN        08/31/22 1730           An After Visit Summary was printed and given to the patient.    Elson Areas, Cordelia Poche 08/31/22 1918    Vanetta Mulders, MD 08/31/22 2256

## 2022-08-31 NOTE — Discharge Instructions (Addendum)
Follow up with Dr. Berneice Heinrich for recheck.

## 2022-09-05 ENCOUNTER — Other Ambulatory Visit: Payer: Self-pay | Admitting: Urology

## 2022-09-17 ENCOUNTER — Encounter (HOSPITAL_BASED_OUTPATIENT_CLINIC_OR_DEPARTMENT_OTHER): Payer: Self-pay | Admitting: Urology

## 2022-09-17 NOTE — Progress Notes (Signed)
Spoke w/ via phone for pre-op interview--- Devin Sanders needs dos---- NONE              Sanders results------ COVID test -----patient states asymptomatic no test needed Arrive at -------1230 NPO after MN NO Solid Food.  Clear liquids from MN until---1130 Med rec completed Medications to take morning of surgery -----Percocet and Zofran PRN Diabetic medication ----- Patient instructed no nail polish to be worn day of surgery Patient instructed to bring photo id and insurance card day of surgery Patient aware to have Driver (ride ) / caregiver  Wife Devin Sanders or mother Devin Sanders  for 24 hours after surgery  Patient Special Instructions ----- Pre-Op special Instructions ----- Patient verbalized understanding of instructions that were given at this phone interview. Patient denies shortness of breath, chest pain, fever, cough at this phone interview.

## 2022-09-26 ENCOUNTER — Encounter (HOSPITAL_BASED_OUTPATIENT_CLINIC_OR_DEPARTMENT_OTHER)
Admission: RE | Disposition: A | Discharge status: Home / Self Care | Payer: Self-pay | Source: Ambulatory Visit | Attending: Urology

## 2022-09-26 ENCOUNTER — Ambulatory Visit (HOSPITAL_BASED_OUTPATIENT_CLINIC_OR_DEPARTMENT_OTHER): Payer: Commercial Managed Care - PPO | Admitting: Anesthesiology

## 2022-09-26 ENCOUNTER — Encounter (HOSPITAL_BASED_OUTPATIENT_CLINIC_OR_DEPARTMENT_OTHER): Payer: Self-pay | Admitting: Urology

## 2022-09-26 ENCOUNTER — Ambulatory Visit (HOSPITAL_BASED_OUTPATIENT_CLINIC_OR_DEPARTMENT_OTHER)
Admission: RE | Admit: 2022-09-26 | Discharge: 2022-09-26 | Disposition: A | Discharge status: Home / Self Care | Payer: Commercial Managed Care - PPO | Source: Ambulatory Visit | Attending: Urology | Admitting: Urology

## 2022-09-26 DIAGNOSIS — K219 Gastro-esophageal reflux disease without esophagitis: Secondary | ICD-10-CM | POA: Insufficient documentation

## 2022-09-26 DIAGNOSIS — Z6830 Body mass index (BMI) 30.0-30.9, adult: Secondary | ICD-10-CM | POA: Insufficient documentation

## 2022-09-26 DIAGNOSIS — N200 Calculus of kidney: Secondary | ICD-10-CM | POA: Insufficient documentation

## 2022-09-26 DIAGNOSIS — E7201 Cystinuria: Secondary | ICD-10-CM

## 2022-09-26 DIAGNOSIS — E669 Obesity, unspecified: Secondary | ICD-10-CM | POA: Insufficient documentation

## 2022-09-26 DIAGNOSIS — N136 Pyonephrosis: Secondary | ICD-10-CM | POA: Diagnosis not present

## 2022-09-26 HISTORY — PX: CYSTOSCOPY/URETEROSCOPY/HOLMIUM LASER/STENT PLACEMENT: SHX6546

## 2022-09-26 SURGERY — CYSTOSCOPY/URETEROSCOPY/HOLMIUM LASER/STENT PLACEMENT
Anesthesia: General | Site: Pelvis | Laterality: Bilateral

## 2022-09-26 MED ORDER — DIPHENHYDRAMINE HCL 50 MG/ML IJ SOLN
INTRAMUSCULAR | Status: AC
  Filled 2022-09-26: qty 1

## 2022-09-26 MED ORDER — ONDANSETRON HCL 4 MG/2ML IJ SOLN
INTRAMUSCULAR | Status: DC | PRN
  Administered 2022-09-26: 4 mg via INTRAVENOUS

## 2022-09-26 MED ORDER — OXYCODONE HCL 5 MG PO TABS
ORAL_TABLET | ORAL | Status: AC
  Filled 2022-09-26: qty 1

## 2022-09-26 MED ORDER — STERILE WATER FOR IRRIGATION IR SOLN
Status: DC | PRN
Start: 2022-09-26 — End: 2022-09-26
  Administered 2022-09-26: 500 mL

## 2022-09-26 MED ORDER — PROPOFOL 10 MG/ML IV BOLUS
INTRAVENOUS | Status: AC
  Filled 2022-09-26: qty 20

## 2022-09-26 MED ORDER — CEPHALEXIN 500 MG PO CAPS: 500.0000 mg | ORAL_CAPSULE | Freq: Two times a day (BID) | ORAL | 0 refills | Status: DC

## 2022-09-26 MED ORDER — SODIUM CHLORIDE 0.9 % IR SOLN
Status: DC | PRN
  Administered 2022-09-26: 3000 mL

## 2022-09-26 MED ORDER — FENTANYL CITRATE (PF) 100 MCG/2ML IJ SOLN
INTRAMUSCULAR | Status: AC
  Filled 2022-09-26: qty 2

## 2022-09-26 MED ORDER — FENTANYL CITRATE (PF) 100 MCG/2ML IJ SOLN
25.0000 ug | INTRAMUSCULAR | Status: DC | PRN
  Administered 2022-09-26: 50 ug via INTRAVENOUS
  Administered 2022-09-26 (×2): 25 ug via INTRAVENOUS

## 2022-09-26 MED ORDER — OXYCODONE HCL 5 MG/5ML PO SOLN: 5.0000 mg | Freq: Once | ORAL | Status: AC | PRN

## 2022-09-26 MED ORDER — MIDAZOLAM HCL 5 MG/5ML IJ SOLN
INTRAMUSCULAR | Status: DC | PRN
  Administered 2022-09-26: 2 mg via INTRAVENOUS

## 2022-09-26 MED ORDER — LIDOCAINE 2% (20 MG/ML) 5 ML SYRINGE
INTRAMUSCULAR | Status: DC | PRN
  Administered 2022-09-26: 80 mg via INTRAVENOUS

## 2022-09-26 MED ORDER — KETOROLAC TROMETHAMINE 30 MG/ML IJ SOLN
INTRAMUSCULAR | Status: DC | PRN
Start: 2022-09-26 — End: 2022-09-26
  Administered 2022-09-26: 30 mg via INTRAVENOUS

## 2022-09-26 MED ORDER — IOHEXOL 300 MG/ML  SOLN
INTRAMUSCULAR | Status: DC | PRN
  Administered 2022-09-26: 26 mL via URETHRAL

## 2022-09-26 MED ORDER — DIPHENHYDRAMINE HCL 50 MG/ML IJ SOLN
12.5000 mg | Freq: Once | INTRAMUSCULAR | Status: AC
  Administered 2022-09-26: 12.5 mg via INTRAVENOUS

## 2022-09-26 MED ORDER — DEXAMETHASONE SODIUM PHOSPHATE 10 MG/ML IJ SOLN
INTRAMUSCULAR | Status: DC | PRN
  Administered 2022-09-26: 10 mg via INTRAVENOUS

## 2022-09-26 MED ORDER — SODIUM CHLORIDE 0.9 % IV SOLN
INTRAVENOUS | Status: AC
  Filled 2022-09-26: qty 100

## 2022-09-26 MED ORDER — LACTATED RINGERS IV SOLN: INTRAVENOUS | Status: DC

## 2022-09-26 MED ORDER — KETOROLAC TROMETHAMINE 30 MG/ML IJ SOLN
INTRAMUSCULAR | Status: AC
  Filled 2022-09-26: qty 1

## 2022-09-26 MED ORDER — SODIUM CHLORIDE 0.9 % IV SOLN: INTRAVENOUS | Status: DC

## 2022-09-26 MED ORDER — PROPOFOL 10 MG/ML IV BOLUS
INTRAVENOUS | Status: DC | PRN
  Administered 2022-09-26: 200 mg via INTRAVENOUS

## 2022-09-26 MED ORDER — MIDAZOLAM HCL 2 MG/2ML IJ SOLN
INTRAMUSCULAR | Status: AC
  Filled 2022-09-26: qty 2

## 2022-09-26 MED ORDER — ACETAMINOPHEN 500 MG PO TABS
1000.0000 mg | ORAL_TABLET | Freq: Once | ORAL | Status: AC
  Administered 2022-09-26: 1000 mg via ORAL

## 2022-09-26 MED ORDER — CEFTRIAXONE SODIUM 2 G IJ SOLR
INTRAMUSCULAR | Status: AC
  Filled 2022-09-26: qty 20

## 2022-09-26 MED ORDER — KETOROLAC TROMETHAMINE 10 MG PO TABS: 10.0000 mg | ORAL_TABLET | Freq: Three times a day (TID) | ORAL | 0 refills | Status: DC | PRN

## 2022-09-26 MED ORDER — DEXMEDETOMIDINE HCL IN NACL 80 MCG/20ML IV SOLN
INTRAVENOUS | Status: DC | PRN
  Administered 2022-09-26 (×3): 4 ug via INTRAVENOUS

## 2022-09-26 MED ORDER — ACETAMINOPHEN 500 MG PO TABS
ORAL_TABLET | ORAL | Status: AC
  Filled 2022-09-26: qty 2

## 2022-09-26 MED ORDER — SODIUM CHLORIDE 0.9 % IV SOLN
2.0000 g | INTRAVENOUS | Status: AC
  Administered 2022-09-26: 2 g via INTRAVENOUS

## 2022-09-26 MED ORDER — OXYCODONE-ACETAMINOPHEN 5-325 MG PO TABS: 1.0000 | ORAL_TABLET | Freq: Four times a day (QID) | ORAL | 0 refills | Status: DC | PRN

## 2022-09-26 MED ORDER — FENTANYL CITRATE (PF) 100 MCG/2ML IJ SOLN
50.0000 ug | Freq: Once | INTRAMUSCULAR | Status: AC
  Administered 2022-09-26: 50 ug via INTRAVENOUS

## 2022-09-26 MED ORDER — OXYCODONE HCL 5 MG PO TABS
5.0000 mg | ORAL_TABLET | Freq: Once | ORAL | Status: AC | PRN
  Administered 2022-09-26: 5 mg via ORAL

## 2022-09-26 MED ORDER — FENTANYL CITRATE (PF) 100 MCG/2ML IJ SOLN
INTRAMUSCULAR | Status: DC | PRN
  Administered 2022-09-26 (×2): 25 ug via INTRAVENOUS
  Administered 2022-09-26: 50 ug via INTRAVENOUS

## 2022-09-26 SURGICAL SUPPLY — 29 items
BAG DRAIN URO-CYSTO SKYTR STRL (DRAIN) ×1 IMPLANT
BAG DRN UROCATH (DRAIN) ×1
BASKET LASER NITINOL 1.9FR (BASKET) IMPLANT
BSKT STON RTRVL 120 1.9FR (BASKET) ×1
CATH URETL OPEN END 6FR 70 (CATHETERS) IMPLANT
CLOTH BEACON ORANGE TIMEOUT ST (SAFETY) ×1 IMPLANT
DRSG TEGADERM 2-3/8X2-3/4 SM (GAUZE/BANDAGES/DRESSINGS) IMPLANT
GLOVE BIO SURGEON STRL SZ7.5 (GLOVE) ×1 IMPLANT
GOWN STRL REUS W/TWL LRG LVL3 (GOWN DISPOSABLE) ×1 IMPLANT
GUIDEWIRE ANG ZIPWIRE 038X150 (WIRE) ×1 IMPLANT
GUIDEWIRE STR DUAL SENSOR (WIRE) ×1 IMPLANT
IV NS 1000ML (IV SOLUTION)
IV NS 1000ML BAXH (IV SOLUTION) ×1 IMPLANT
IV NS IRRIG 3000ML ARTHROMATIC (IV SOLUTION) ×1 IMPLANT
KIT TURNOVER CYSTO (KITS) ×1 IMPLANT
LASER FIB FLEXIVA PULSE ID 365 (Laser) IMPLANT
MANIFOLD NEPTUNE II (INSTRUMENTS) ×1 IMPLANT
NS IRRIG 500ML POUR BTL (IV SOLUTION) ×1 IMPLANT
PACK CYSTO (CUSTOM PROCEDURE TRAY) ×1 IMPLANT
SHEATH NAVIGATOR HD 12/14X36 (SHEATH) IMPLANT
SLEEVE SCD COMPRESS KNEE MED (STOCKING) ×1 IMPLANT
STENT POLARIS 5FRX24 (STENTS) IMPLANT
SYR 10ML LL (SYRINGE) ×1 IMPLANT
TRACTIP FLEXIVA PULS ID 200XHI (Laser) IMPLANT
TRACTIP FLEXIVA PULSE ID 200 (Laser) ×1
TUBE CONNECTING 12X1/4 (SUCTIONS) ×1 IMPLANT
TUBE PU 8FR 16IN ENFIT (TUBING) IMPLANT
TUBING UROLOGY SET (TUBING) ×1 IMPLANT
WATER STERILE IRR 500ML POUR (IV SOLUTION) IMPLANT

## 2022-09-26 NOTE — Anesthesia Preprocedure Evaluation (Addendum)
Anesthesia Evaluation  Patient identified by MRN, date of birth, ID band Patient awake    Reviewed: Allergy & Precautions, NPO status , Patient's Chart, lab work & pertinent test results  Airway Mallampati: II  TM Distance: >3 FB Neck ROM: Full    Dental no notable dental hx. (+) Teeth Intact, Dental Advisory Given   Pulmonary neg pulmonary ROS   Pulmonary exam normal breath sounds clear to auscultation       Cardiovascular negative cardio ROS Normal cardiovascular exam Rhythm:Regular Rate:Normal     Neuro/Psych negative neurological ROS  negative psych ROS   GI/Hepatic Neg liver ROS,GERD  ,,  Endo/Other  negative endocrine ROS    Renal/GU negative Renal ROS  negative genitourinary   Musculoskeletal negative musculoskeletal ROS (+)    Abdominal   Peds  Hematology negative hematology ROS (+)   Anesthesia Other Findings Bilateral renal stones  Reproductive/Obstetrics                             Anesthesia Physical Anesthesia Plan  ASA: 2  Anesthesia Plan: General   Post-op Pain Management: Tylenol PO (pre-op)*   Induction: Intravenous  PONV Risk Score and Plan: 2 and Ondansetron, Dexamethasone and Midazolam  Airway Management Planned: LMA  Additional Equipment:   Intra-op Plan:   Post-operative Plan: Extubation in OR  Informed Consent: I have reviewed the patients History and Physical, chart, labs and discussed the procedure including the risks, benefits and alternatives for the proposed anesthesia with the patient or authorized representative who has indicated his/her understanding and acceptance.     Dental advisory given  Plan Discussed with: CRNA  Anesthesia Plan Comments:        Anesthesia Quick Evaluation

## 2022-09-26 NOTE — Discharge Instructions (Addendum)
1 - You may have urinary urgency (bladder spasms) and bloody urine on / off with stent in place. This is normal.  2 - Remove tethered stents on Monday morning at home by pulling on strings, then blue-white plastic tubing, and discarding. Office is open Monday if any problems arise.   3 - Call MD or go to ER for fever >102, severe pain / nausea / vomiting not relieved by medications, or acute change in medical status  No acetaminophen/Tylenol until after 7:30 pm today if needed. No ibuprofen, Advil, Aleve, Motrin, ketorolac, meloxicam, naproxen, or other NSAIDS until after midnight if needed.    Post Anesthesia Home Care Instructions  Activity: Get plenty of rest for the remainder of the day. A responsible individual must stay with you for 24 hours following the procedure.  For the next 24 hours, DO NOT: -Drive a car -Advertising copywriter -Drink alcoholic beverages -Take any medication unless instructed by your physician -Make any legal decisions or sign important papers.  Meals: Start with liquid foods such as gelatin or soup. Progress to regular foods as tolerated. Avoid greasy, spicy, heavy foods. If nausea and/or vomiting occur, drink only clear liquids until the nausea and/or vomiting subsides. Call your physician if vomiting continues.  Special Instructions/Symptoms: Your throat may feel dry or sore from the anesthesia or the breathing tube placed in your throat during surgery. If this causes discomfort, gargle with warm salt water. The discomfort should disappear within 24 hours.

## 2022-09-26 NOTE — Brief Op Note (Signed)
09/26/2022  3:46 PM  PATIENT:  Devin Sanders  42 y.o. male  PRE-OPERATIVE DIAGNOSIS:  BILATERAL RENAL STONE  POST-OPERATIVE DIAGNOSIS:  BILATERAL RENAL STONE  PROCEDURE:  Procedure(s): CYSTOSCOPY BILATERAL RETROGRADE URETEROSCOPY/HOLMIUM LASER/STENT PLACEMENT (Bilateral)  SURGEON:  Surgeons and Role:    * Tee Richeson, Delbert Phenix., MD - Primary  PHYSICIAN ASSISTANT:   ASSISTANTS: none   ANESTHESIA:   general  EBL:  minimal   BLOOD ADMINISTERED:none  DRAINS: none   LOCAL MEDICATIONS USED:  NONE  SPECIMEN:  Source of Specimen:  bilateral renal stone fragments  DISPOSITION OF SPECIMEN:   discard  COUNTS:  YES  TOURNIQUET:  * No tourniquets in log *  DICTATION: .Other Dictation: Dictation Number [id number]  PLAN OF CARE: Discharge to home after PACU  PATIENT DISPOSITION:  PACU - hemodynamically stable.   Delay start of Pharmacological VTE agent (>24hrs) due to surgical blood loss or risk of bleeding: yes

## 2022-09-26 NOTE — Anesthesia Procedure Notes (Signed)
Procedure Name: LMA Insertion Date/Time: 09/26/2022 3:10 PM  Performed by: Kimbely Whiteaker D, CRNAPre-anesthesia Checklist: Patient identified, Emergency Drugs available, Suction available and Patient being monitored Patient Re-evaluated:Patient Re-evaluated prior to induction Oxygen Delivery Method: Circle system utilized Preoxygenation: Pre-oxygenation with 100% oxygen Induction Type: IV induction Ventilation: Mask ventilation without difficulty LMA: LMA inserted LMA Size: 4.0 Tube type: Oral Number of attempts: 1 Placement Confirmation: positive ETCO2 and breath sounds checked- equal and bilateral Tube secured with: Tape Dental Injury: Teeth and Oropharynx as per pre-operative assessment

## 2022-09-26 NOTE — Transfer of Care (Signed)
Immediate Anesthesia Transfer of Care Note  Patient: Devin Sanders  Procedure(s) Performed: CYSTOSCOPY BILATERAL RETROGRADE URETEROSCOPY/HOLMIUM LASER/STENT PLACEMENT (Bilateral: Pelvis)  Patient Location: PACU  Anesthesia Type:General  Level of Consciousness: awake, alert , and oriented  Airway & Oxygen Therapy: Patient Spontanous Breathing and Patient connected to nasal cannula oxygen  Post-op Assessment: Report given to RN and Post -op Vital signs reviewed and stable  Post vital signs: Reviewed and stable  Last Vitals:  Vitals Value Taken Time  BP 120/89 09/26/22 1600  Temp    Pulse 95 09/26/22 1602  Resp 18 09/26/22 1602  SpO2 99 % 09/26/22 1602  Vitals shown include unfiled device data.  Last Pain:  Vitals:   09/26/22 1440  TempSrc:   PainSc: 6       Patients Stated Pain Goal: 6 (09/26/22 1440)  Complications: No notable events documented.

## 2022-09-26 NOTE — H&P (Signed)
Devin Sanders is an 42 y.o. male.    Chief Complaint: Pre-Op Bilateral Ureteroscopic Stone Manipulation  HPI:   1 - Recurrent Nephrolithiasis -  Pre 2019 - PCNL several each side, URS x innumerable each side at [facility].   Recent Course -  01/2019 -bilateral stage urs to stone free; 05/2019 - bilateral URS to stone free; 09/2019 [place] - 1cm RUP, 1cm RLP, 1.4cm LUP no hydr8/2022 - Rt URS for RLP 1cm, Rt mid 6mm, punctate Lt renal not adressed; 10/2020 - Bilateral URS, punctate stones only to stone free  02/2021 - Bilateral URS to stone free + incision of left infundibular stenossi upper mid / non tethered stents  09/2021 - Bilaterl URS to stone free (punctate pap tip calcs only)  02/2022 - Bilateral URS to stone free   Recent Surveillance:  06/2022 - KUB, Renal US - bilateral pap tip calcs L>R, no hydro  09/2022 - CT - L>R pap tip cals, no hydro,    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   PMH sig for mild obesity. 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 His PCP is [name] MD.   Today " Devin November " is seen to proceed with BILATERAL Ureteroscopic stone Manipulation for L>R renal stones and occasional flank pain. NO interval fevers. Most recent UA without infectious parameters.     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)   GERD (gastroesophageal reflux disease)    History of kidney stones    Left ureteral calculus    Renal calculus, left    Wears glasses     Past Surgical History:  Procedure Laterality Date   CYSTOSCOPY WITH RETROGRADE PYELOGRAM, URETEROSCOPY AND STENT PLACEMENT Bilateral 06/26/2017   Procedure: CYSTOSCOPY WITH RETROGRADE PYELOGRAM, URETEROSCOPY, STONE BASKETRY  AND STENT PLACEMENT;  Surgeon: [name], MD;  Location[facility];  Service: Urology;   Laterality: Bilateral;   CYSTOSCOPY WITH RETROGRADE PYELOGRAM, URETEROSCOPY AND STENT PLACEMENT Right 09/18/2017   Procedure: CYSTOSCOPY WITH RETROGRADE PYELOGRAM, URETEROSCOPY AND STENT PLACEMENT;  Surgeon: Crist Fat, MD;  Location: [facility];  Service: Urology;  Laterality: Right;   CYSTOSCOPY WITH RETROGRADE PYELOGRAM, URETEROSCOPY AND STENT PLACEMENT Bilateral 10/13/2017   Procedure: CYSTOSCOPY WITH RETROGRADE PYELOGRAM, BILATERAL URETEROSCOPY AND BILATERAL STENT PLACEMENT, LASER;  Surgeon: [name], MD;  Location: [facility];  Service: Urology;  Laterality: Bilateral;  90 MINS   CYSTOSCOPY WITH RETROGRADE PYELOGRAM, URETEROSCOPY AND STENT PLACEMENT Bilateral 03/24/2018   Procedure: CYSTOSCOPY WITH RETROGRADE PYELOGRAM, URETEROSCOPY AND STENT PLACEMENT;  Surgeon: [name], MD;  Location: [facility];  Service: Urology;  Laterality: Bilateral;  1 HR   CYSTOSCOPY WITH RETROGRADE PYELOGRAM, URETEROSCOPY AND STENT PLACEMENT Bilateral 07/07/2018   Procedure: CYSTOSCOPY WITH RETROGRADE PYELOGRAM, URETEROSCOPY AND STENT PLACEMENT;  Surgeon: [name], MD;  Location: [facility];  Service: Urology;  Laterality: Bilateral;  75 MINUTES   CYSTOSCOPY WITH RETROGRADE PYELOGRAM, URETEROSCOPY AND STENT PLACEMENT Left 01/12/2019   Procedure: CYSTOSCOPY WITH RETROGRADE PYELOGRAM, URETEROSCOPY AND STENT PLACEMENT;  Surgeon: [name], MD;  Location: [facility];  Service: Urology;  Laterality: Left;  90 MINS   CYSTOSCOPY WITH RETROGRADE PYELOGRAM, URETEROSCOPY AND STENT PLACEMENT Left 01/28/2019   Procedure: CYSTOSCOPY WITH RETROGRADE PYELOGRAM, URETEROSCOPY AND STENT EXCHANGE STONE BASKET EXTRACTION ;  Surgeon: [name], MD;  Location: [facility];  Service: Urology;  Laterality: Left;   CYSTOSCOPY WITH RETROGRADE PYELOGRAM, URETEROSCOPY AND STENT PLACEMENT Bilateral 05/13/2019   Procedure: CYSTOSCOPY WITH BILATERAL  RETROGRADE PYELOGRAM, LEFT URETEROSCOPY WITH HOLMIUM LASER AND STENT  PLACEMENT;  Surgeon: [name], MD;  Location: [facility];  Service: Urology;  Laterality: Bilateral;   CYSTOSCOPY WITH RETROGRADE PYELOGRAM, URETEROSCOPY AND STENT PLACEMENT Bilateral 11/02/2019   Procedure: CYSTOSCOPY WITH RETROGRADE PYELOGRAM, URETEROSCOPY AND STENT PLACEMENT;  Surgeon: [name], MD;  Location: [facility];  Service: Urology;  Laterality: Bilateral;  90 MINS   CYSTOSCOPY WITH RETROGRADE PYELOGRAM, URETEROSCOPY AND STENT PLACEMENT Bilateral 10/12/2020   Procedure: CYSTOSCOPY WITH RETROGRADE PYELOGRAM, URETEROSCOPY, BASKETING OF STONES AND BILATERAL STENT PLACEMENTS;  Surgeon: [name], MD;  Location: [facility];  Service: Urology;  Laterality: Bilateral;  75 MINS   CYSTOSCOPY WITH RETROGRADE PYELOGRAM, URETEROSCOPY AND STENT PLACEMENT Bilateral 02/15/2021   Procedure: CYSTOSCOPY WITH RETROGRADE PYELOGRAM, URETEROSCOPY AND STENT PLACEMENT;  Surgeon: [name], MD;  Location: [facility];  Service: Urology;  Laterality: Bilateral;  75 MINS   CYSTOSCOPY WITH RETROGRADE PYELOGRAM, URETEROSCOPY AND STENT PLACEMENT Bilateral 09/27/2021   Procedure: CYSTOSCOPY WITH RETROGRADE PYELOGRAM, URETEROSCOPY AND STENT PLACEMENT, VASECTOMY;  Surgeon: [name], MD;  Location: [facility];  Service: Urology;  Laterality: Bilateral;  75 MINS   CYSTOSCOPY WITH RETROGRADE PYELOGRAM, URETEROSCOPY AND STENT PLACEMENT Bilateral 02/12/2022   Procedure: CYSTOSCOPY WITH RETROGRADE PYELOGRAM, URETEROSCOPY AND STENT PLACEMENT;  Surgeon: [name], MD;  Location: [facility];  Service: Urology;  Laterality: Bilateral;  75 MINS   CYSTOSCOPY/RETROGRADE/URETEROSCOPY/STONE EXTRACTION WITH BASKET Left 10/05/2017   Procedure: CYSTOSCOPY/RETROGRADE/STONE REMOVAL FROM BLADDER ;  Surgeon: Bjorn Pippin, MD;  Location: [facility];  Service: Urology;  Laterality: Left;   CYSTOSCOPY/URETEROSCOPY/HOLMIUM LASER/STENT PLACEMENT Right 09/28/2017   Procedure: CYSTOSCOPY/RIGHT RETROGRADE URETEROSCOPY/HOLMIUM  LASER/ RIGHT STENT PLACEMENT;  Surgeon: Rene Paci, MD;  Location: [facility];  Service: Urology;  Laterality: Right;   CYSTOSCOPY/URETEROSCOPY/HOLMIUM LASER/STENT PLACEMENT Bilateral 01/27/2018   Procedure: CYSTOSCOPY/URETEROSCOPY/HOLMIUM LASER/STENT PLACEMENT;  Surgeon: [name], MD;  Location[facility];  Service: Urology;  Laterality: Bilateral;   CYSTOSCOPY/URETEROSCOPY/HOLMIUM LASER/STENT PLACEMENT Right 08/08/2020   Procedure: CYSTOSCOPY/URETEROSCOPY/RETROGRADE/ HOLMIUM LASER/STENT PLACEMENT;  Surgeon: Marcine Matar, MD;  Location: [facility];  Service: Urology;  Laterality: Right;   HOLMIUM LASER APPLICATION Bilateral 06/26/2017   Procedure: HOLMIUM LASER APPLICATION;  Surgeon: [name], MD;  Location: [facility];  Service: Urology;  Laterality: Bilateral;   HOLMIUM LASER APPLICATION Right 09/18/2017   Procedure: HOLMIUM LASER APPLICATION;  Surgeon: Crist Fat, MD;  Location: [facility];  Service: Urology;  Laterality: Right;   HOLMIUM LASER APPLICATION Bilateral 10/13/2017   Procedure: HOLMIUM LASER APPLICATION;  Surgeon: [name], MD;  Location: [facility];  Service: Urology;  Laterality: Bilateral;   HOLMIUM LASER APPLICATION Bilateral 01/27/2018   Procedure: HOLMIUM LASER APPLICATION;  Surgeon: [name], MD;  Location: [facility];  Service: Urology;  Laterality: Bilateral;   HOLMIUM LASER APPLICATION Bilateral 03/24/2018   Procedure: HOLMIUM LASER APPLICATION;  Surgeon: [name], MD;  Location[facility];  Service: Urology;  Laterality: Bilateral;   HOLMIUM LASER APPLICATION Bilateral 07/07/2018   Procedure: HOLMIUM LASER APPLICATION;  Surgeon: [name], MD;  Location[facility];  Service: Urology;  Laterality: Bilateral;   HOLMIUM LASER APPLICATION Left 01/12/2019   Procedure: HOLMIUM LASER APPLICATION;  Surgeon: [name], MD;  Location[facility];  Service: Urology;  Laterality: Left;   HOLMIUM  LASER APPLICATION Bilateral  11/02/2019   Procedure: HOLMIUM LASER APPLICATION;  Surgeon: [name], MD;  Location: [facility];  Service: Urology;  Laterality: Bilateral;   HOLMIUM LASER APPLICATION Bilateral 02/15/2021   Procedure: HOLMIUM LASER APPLICATION;  Surgeon: [name], MD;  Location[facility];  Service: Urology;  Laterality: Bilateral;   HOLMIUM LASER APPLICATION Bilateral 09/27/2021   Procedure: HOLMIUM LASER APPLICATION;  Surgeon: [name], MD;  Location[facility];  Service: Urology;  Laterality: Bilateral;   HOLMIUM LASER APPLICATION Bilateral 02/12/2022   Procedure: HOLMIUM LASER APPLICATION;  Surgeon: [name], MD;  Location: [facility];  Service: Urology;  Laterality: Bilateral;   PERCUTANEOUS NEPHROSTOLITHOTOMY  2003;  2009;  06-22-2014; 01-30-2015  @ [facility]   URETEROSCOPIC STONE MANIPULATION UNILATERAL  multiple since 1997--  last one 02-19-2017  @ [facility]    History reviewed. No pertinent family history. Social History:  reports that he has never smoked. He has never used smokeless tobacco. He reports that he does not drink alcohol and does not use drugs.  Allergies:  Allergies  Allergen Reactions   Morphine Nausea And Vomiting   Other Hives and Swelling    Mangos    No medications prior to admission.    No results found for this or any previous visit (from the past 48 hour(s)). No results found.  Review of Systems  Constitutional:  Negative for chills and fever.  All other systems reviewed and are negative.   Height 5\' 10"  (1.778 m), weight 95.3 kg. Physical Exam Vitals reviewed.  HENT:     Head: Normocephalic.     Nose: Nose normal.  Eyes:     Pupils: Pupils are equal, round, and reactive to light.  Cardiovascular:     Rate and Rhythm: Normal rate.  Pulmonary:     Effort: Pulmonary effort is normal.  Abdominal:     General: Abdomen is flat.  Genitourinary:    Comments: Minimal CVAT at present Musculoskeletal:         General: Normal range of motion.     Cervical back: Normal range of motion.  Skin:    General: Skin is warm.  Neurological:     General: No focal deficit present.     Mental Status: He is alert.  Psychiatric:        Mood and Affect: Mood normal.      Assessment/Plan  Proceed as planned with BILATERAL ureteroscopic stone manipulation. Risks, benefits, alternatives, expected peri-op course discussed in detail.   Loletta Parish., MD 09/26/2022, 7:58 AM

## 2022-09-26 NOTE — Op Note (Unsigned)
NAME: Devin Sanders, [place] MEDICAL RECORD NO: [id number] ACCOUNT NO: [id number] DATE OF BIRTH: 06/02/1980 FACILITY: [facility] LOCATION: [facility] PHYSICIAN: [name], MD  Operative Report   DATE OF PROCEDURE: 09/26/2022  SURGEON:  [name], MD  PREOPERATIVE DIAGNOSES:  Cystinuria, bilateral renal stones with recurrent colic.  PROCEDURE PERFORMED: 1.  Cystoscopy with bilateral retrograde pyelograms interpretation. 2.  Bilateral ureteroscopy with laser lithotripsy and insertion of bilateral ureteral stents.  ESTIMATED BLOOD LOSS:  Nil.  COMPLICATIONS:  None.  SPECIMEN:  Bilateral renal stone fragments for discard.  FINDINGS: 1.  Bilateral papillary tip calcifications approximately 8 mm total each side.  No hydronephrosis or obstructing stones. 2.  Complete resolution of all accessible stone fragments larger than one-third mm following laser lithotripsy and basket extraction bilaterally. 3.  Successful insertion of bilateral ureteral stents, proximal end in the renal pelvis, distal end in urinary bladder with tether.  INDICATIONS:  The patient is a pleasant 42 year old man with a history of cystinuria and recurrent urolithiasis.  He has had innumerable stone procedures over his lifetime and is very compliant with medical therapy.  He was found on workup of flank pain  to have some left greater than right intrarenal stones on most recent imaging, and he wished to proceed with bilateral ureteroscopic cleanout, mostly for preemptive management to prevent progression to larger stones requiring percutaneous rather more  renally detrimental procedures.  He presents for bilateral ureteroscopy today.  Informed consent was obtained and placed in medical record.  PROCEDURE IN DETAIL:  The patient being Devin Sanders was verified and procedure being bilateral ureteroscopic stone manipulation was confirmed.  Procedure timeout was performed.  Intravenous antibiotics were administered.   General LMA anesthesia  induced.  The patient was placed into a low lithotomy position.  Sterile field was created, prepped and draped the patient's penis, perineum, and proximal thighs using iodine.  Cystourethroscopy was performed using 21-French rigid cystoscope with offset  lens.  Inspection of anterior and posterior urethra was unremarkable.  Inspection of urinary bladder revealed some crystal area as anticipated.  No stone fragments were noted.  The right ureteral orifice was cannulated with a 6-French open-ended  catheter, and right retrograde pyelogram was obtained.  Right retrograde pyelogram demonstrated single right ureter, single system right kidney.  No filling defects or narrowing noted.  A ZIPwire was advanced to lower pole, set aside as a safety wire.  Next, left retrograde pyelogram was obtained.  Left retrograde pyelogram demonstrated single left ureter, single system left kidney.  No filling defects or narrowing noted.  No hydronephrosis noted.  A separate ZIPwire advanced to the upper pole, set aside as a safety wire.  An 8-French feeding tube  placed in the urinary bladder for pressure release.  Next, semirigid ureteroscopy was performed of the distal two-thirds right ureter alongside a separate sensor working wire.  No mucosal abnormalities were found.  Next, semirigid ureteroscopy was  performed of the distal two-thirds left ureter alongside a separate sensor working wire.  No mucosal abnormalities were found.  A semirigid scope was exchanged for a medium length ureteral access sheath to the level of proximal ureter using continuous  fluoroscopic guidance and flexible digital ureteroscopy performed of the proximal left ureter and systematic inspection of left kidney including all calices x3.  There were some very small scant intrarenal stones free floating.  Most of these fragments  were a millimeter or less.  There were also several foci of papillary tip calcifications and some  obvious mucosal stones  as well. Several small fragments were amenable to simple basketing, they were removed, set aside for discard.  The remaining areas of  papillary tip calcifications, submucosal calcifications were ablated using holmium laser energy using settings of 1 joule and 10 Hz.  This resulted in complete resolution of all accessible stones and ablation of all obvious submucosal stones larger than  one-third mm.  Access sheath was removed under continuous vision, no significant mucosal abnormalities were found.  Next, the access sheath was placed over the right sensor working wire to the level of proximal right ureter using continuous fluoroscopic  guidance and flexible digital ureteroscopy was performed of the proximal ureter and systematic inspection of the right kidney, including all calices x3.  There was multifocal papillary tip calcifications on the right aside as well.  One of these were  approximately 3 mm.  This was amenable to basketing.  The remainder of the papillary tip calcifications were amenable to laser lithotripsy and they were ablated again using settings of 1 joule and 10 Hz.  There was one focus of submucosal stone which was  similarly ablated.  Following this, complete resolution of all accessible stone fragments larger than one-third mm on the right side.  Access sheath was removed under continuous vision, no significant mucosal abnormalities were found.  Given the  bilateral nature of procedure, it was felt that brief interval stenting with tethered stent would be most prudent.  As such, a new 5 x 24 Polaris type stents were carefully placed over the remaining safety wires bilaterally using fluoroscopic guidance.   Good proximal and distal plane were noted.  Tether was left in place, fashioned together, trimmed to length and taped to the dorsum of penis.  The procedure was terminated.  The patient tolerated procedure well, no immediate periprocedural complications.   The  patient taken to postanesthesia care unit in stable condition.  Plan for discharge home.   NIK D: 09/26/2022 3:52:00 pm T: 09/26/2022 9:50:00 pm  JOB: [id number]/ [id number]

## 2022-09-27 NOTE — Anesthesia Postprocedure Evaluation (Signed)
Anesthesia Post Note  Patient: ANGELJESUS SYMONDS  Procedure(s) Performed: CYSTOSCOPY BILATERAL RETROGRADE URETEROSCOPY/HOLMIUM LASER/STENT PLACEMENT (Bilateral: Pelvis)     Patient location during evaluation: PACU Anesthesia Type: General Level of consciousness: awake and alert Pain management: pain level controlled Vital Signs Assessment: post-procedure vital signs reviewed and stable Respiratory status: spontaneous breathing, nonlabored ventilation, respiratory function stable and patient connected to nasal cannula oxygen Cardiovascular status: blood pressure returned to baseline and stable Postop Assessment: no apparent nausea or vomiting Anesthetic complications: no  No notable events documented.  Last Vitals:  Vitals:   09/26/22 1645 09/26/22 1700  BP: (!) 125/92 (!) 125/96  Pulse: 85 82  Resp: 16 18  Temp:  36.9 C  SpO2: 94% 95%    Last Pain:  Vitals:   09/26/22 1700  TempSrc:   PainSc: 4    Pain Goal: Patients Stated Pain Goal: 6 (09/26/22 1440)                 Dashanique Brownstein L Nyima Vanacker

## 2022-09-28 ENCOUNTER — Inpatient Hospital Stay (HOSPITAL_COMMUNITY)
Admission: EM | Admit: 2022-09-28 | Discharge: 2022-10-03 | DRG: 660 | Disposition: A | Discharge status: Home / Self Care | Payer: Commercial Managed Care - PPO | Attending: Internal Medicine | Admitting: Internal Medicine

## 2022-09-28 ENCOUNTER — Other Ambulatory Visit: Payer: Self-pay

## 2022-09-28 ENCOUNTER — Encounter (HOSPITAL_COMMUNITY): Payer: Self-pay | Admitting: *Deleted

## 2022-09-28 DIAGNOSIS — R52 Pain, unspecified: Secondary | ICD-10-CM

## 2022-09-28 DIAGNOSIS — Z8616 Personal history of COVID-19: Secondary | ICD-10-CM

## 2022-09-28 DIAGNOSIS — Z9889 Other specified postprocedural states: Secondary | ICD-10-CM

## 2022-09-28 DIAGNOSIS — Z885 Allergy status to narcotic agent status: Secondary | ICD-10-CM

## 2022-09-28 DIAGNOSIS — Z87442 Personal history of urinary calculi: Secondary | ICD-10-CM

## 2022-09-28 DIAGNOSIS — E668 Other obesity: Secondary | ICD-10-CM | POA: Diagnosis present

## 2022-09-28 DIAGNOSIS — N2889 Other specified disorders of kidney and ureter: Secondary | ICD-10-CM | POA: Diagnosis present

## 2022-09-28 DIAGNOSIS — N200 Calculus of kidney: Principal | ICD-10-CM | POA: Diagnosis present

## 2022-09-28 DIAGNOSIS — Z79899 Other long term (current) drug therapy: Secondary | ICD-10-CM

## 2022-09-28 DIAGNOSIS — N390 Urinary tract infection, site not specified: Secondary | ICD-10-CM

## 2022-09-28 DIAGNOSIS — K219 Gastro-esophageal reflux disease without esophagitis: Secondary | ICD-10-CM | POA: Diagnosis present

## 2022-09-28 DIAGNOSIS — Z91018 Allergy to other foods: Secondary | ICD-10-CM

## 2022-09-28 DIAGNOSIS — N136 Pyonephrosis: Principal | ICD-10-CM | POA: Diagnosis present

## 2022-09-28 DIAGNOSIS — E7201 Cystinuria: Secondary | ICD-10-CM | POA: Diagnosis present

## 2022-09-28 DIAGNOSIS — R208 Other disturbances of skin sensation: Secondary | ICD-10-CM | POA: Diagnosis not present

## 2022-09-28 DIAGNOSIS — Z6831 Body mass index (BMI) 31.0-31.9, adult: Secondary | ICD-10-CM

## 2022-09-28 DIAGNOSIS — R319 Hematuria, unspecified: Secondary | ICD-10-CM | POA: Diagnosis present

## 2022-09-28 LAB — COMPREHENSIVE METABOLIC PANEL
ALT: 15 U/L (ref 0–44)
AST: 15 U/L (ref 15–41)
Albumin: 4.1 g/dL (ref 3.5–5.0)
Alkaline Phosphatase: 55 U/L (ref 38–126)
Anion gap: 7 (ref 5–15)
BUN: 27 mg/dL — ABNORMAL HIGH (ref 6–20)
CO2: 23 mmol/L (ref 22–32)
Calcium: 8.6 mg/dL — ABNORMAL LOW (ref 8.9–10.3)
Chloride: 107 mmol/L (ref 98–111)
Creatinine, Ser: 1.02 mg/dL (ref 0.61–1.24)
GFR, Estimated: >60 mL/min (ref >60)
Glucose, Bld: 96 mg/dL (ref 70–99)
Potassium: 3.8 mmol/L (ref 3.5–5.1)
Sodium: 137 mmol/L (ref 135–145)
Total Bilirubin: 0.7 mg/dL (ref 0.3–1.2)
Total Protein: 6.9 g/dL (ref 6.5–8.1)

## 2022-09-28 LAB — URINALYSIS, ROUTINE W REFLEX MICROSCOPIC
Bacteria, UA: NONE SEEN
Bilirubin Urine: NEGATIVE
Glucose, UA: NEGATIVE mg/dL
Ketones, ur: NEGATIVE mg/dL
Nitrite: NEGATIVE
Protein, ur: >=300 mg/dL — AB
RBC / HPF: >50 RBC/hpf (ref 0–5)
Specific Gravity, Urine: 1.025 (ref 1.005–1.030)
pH: 6 (ref 5.0–8.0)

## 2022-09-28 LAB — CBC
HCT: 39.8 % (ref 39.0–52.0)
Hemoglobin: 13.5 g/dL (ref 13.0–17.0)
MCH: 33.8 pg (ref 26.0–34.0)
MCHC: 33.9 g/dL (ref 30.0–36.0)
MCV: 99.7 fL (ref 80.0–100.0)
Platelets: 255 10*3/uL (ref 150–400)
RBC: 3.99 MIL/uL — ABNORMAL LOW (ref 4.22–5.81)
RDW: 11.6 % (ref 11.5–15.5)
WBC: 6.5 10*3/uL (ref 4.0–10.5)
nRBC: 0 % (ref 0.0–0.2)

## 2022-09-28 LAB — LIPASE, BLOOD: Lipase: 29 U/L (ref 11–51)

## 2022-09-28 MED ORDER — HYDROMORPHONE HCL 1 MG/ML IJ SOLN
1.0000 mg | Freq: Once | INTRAMUSCULAR | Status: AC
  Administered 2022-09-28: 1 mg via INTRAVENOUS
  Filled 2022-09-28: qty 1

## 2022-09-28 MED ORDER — SODIUM CHLORIDE 0.9 % IV BOLUS
1000.0000 mL | Freq: Once | INTRAVENOUS | Status: AC
  Administered 2022-09-28: 1000 mL via INTRAVENOUS

## 2022-09-28 MED ORDER — ONDANSETRON HCL 4 MG/2ML IJ SOLN
4.0000 mg | Freq: Once | INTRAMUSCULAR | Status: AC
  Administered 2022-09-28: 4 mg via INTRAVENOUS
  Filled 2022-09-28: qty 2

## 2022-09-28 NOTE — ED Triage Notes (Signed)
Pt reporting kidney stone removal with stent placed on Friday. Says he is urinating ok, just bloody. Bilateral lower abdominal pain and vomiting. Last med 1400- percocet.

## 2022-09-28 NOTE — ED Provider Notes (Signed)
Devin Sanders   CSN: [id number] Arrival date & time: 09/28/22  1933     History {Add pertinent medical, surgical, social history, OB history to HPI:1} Chief Complaint  Patient presents with   Abdominal Pain    Devin Sanders is a 42 y.o. male.  The history is provided by the patient and medical records. No language interpreter was used.  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 Patient stated he had bilateral kidney stents placed on Friday. He reports lower quadrant abdominal pain, 9/10. He is unable to tolerate anything PO. Patient denies eating today. Patient states he has had recent hospitalizations for the kidney stone procedures, the last on being back in Feb 2024. Patient denies fever, chills, headache, and dizziness. Patient endorses sharp abdominal pain, nausea/vomiting, and hematuria. He states he sometimes see clots in his urine, and it happens to occur more in the morning.   Home Medications Prior to Admission medications   Medication Sig Start Date End Date Taking? Authorizing Provider  acetaminophen (TYLENOL) 325 MG tablet Take 650 mg by mouth every 6 (six) hours as needed for mild pain.    [provider]  cephALEXin (KEFLEX) 500 MG capsule Take 1 capsule (500 mg total) by mouth 2 (two) times daily for 3 days. To prevent infection with tethered stents 09/26/22 09/29/22  Loletta Parish., MD  ibuprofen (ADVIL) 200 MG tablet Take 200 mg by mouth every 6 (six) hours as needed for fever.    [provider]  ketorolac (TORADOL) 10 MG tablet Take 1 tablet (10 mg total) by mouth every 8 (eight) hours as needed for moderate pain (or stent discomfort post-operatively). 09/26/22   Loletta Parish., MD  ondansetron (ZOFRAN-ODT) 4 MG  disintegrating tablet Take 1 tablet (4 mg total) by mouth every 8 (eight) hours as needed for nausea or vomiting. 08/31/22   Elson Areas, PA-C  oxyCODONE-acetaminophen (PERCOCET) 5-325 MG tablet Take 1 tablet by mouth every 6 (six) hours as needed for severe pain (post-operatively). 09/26/22 09/26/23  Loletta Parish., MD  THIOLA EC 300 MG TBEC Take 300 mg by mouth in the morning, at noon, and at bedtime. 08/30/21   [provider]      Allergies    Morphine and Other    Review of Systems   Review of Systems  Gastrointestinal:  Positive for abdominal pain.  All other systems reviewed and are negative.   Physical Exam Updated Vital Signs BP (!) 146/95 (BP Location: Left Arm)   Pulse 98   Temp 97.9 F (36.6 C) (Oral)   Resp 20   Ht 5\' 10"  (1.778 m)   Wt 96.6 kg   SpO2 96%   BMI 30.56 kg/m  Physical Exam Vitals and nursing Sanders reviewed.  Constitutional:      General: He is not in acute distress.    Appearance: He is well-developed.  HENT:     Head: Atraumatic.  Eyes:     Conjunctiva/sclera: Conjunctivae normal.  Cardiovascular:     Rate and Rhythm: Normal rate and regular rhythm.  Pulmonary:     Effort: Pulmonary effort is normal.     Breath sounds: Normal breath sounds.  Abdominal:     Tenderness: There is abdominal tenderness in  the suprapubic area. There is no right CVA tenderness or left CVA tenderness.  Musculoskeletal:     Cervical back: Neck supple.  Skin:    Findings: No rash.  Neurological:     Mental Status: He is alert.     ED Results / Procedures / Treatments   Labs (all labs ordered are listed, but only abnormal results are displayed) Labs Reviewed  COMPREHENSIVE METABOLIC PANEL - Abnormal; Notable for the following components:      Result Value   BUN 27 (*)    Calcium 8.6 (*)    All other components within normal limits  CBC - Abnormal; Notable for the following components:   RBC 3.99 (*)    All other components within normal  limits  URINALYSIS, ROUTINE W REFLEX MICROSCOPIC - Abnormal; Notable for the following components:   APPearance HAZY (*)    Hgb urine dipstick LARGE (*)    Protein, ur >=300 (*)    Leukocytes,Ua MODERATE (*)    All other components within normal limits  LIPASE, BLOOD    EKG None  Radiology No results found.  Procedures Procedures  {Document cardiac monitor, telemetry assessment procedure when appropriate:1}  Medications Ordered in ED Medications  HYDROmorphone (DILAUDID) injection 1 mg (1 mg Intravenous Given 09/28/22 2206)  ondansetron (ZOFRAN) injection 4 mg (4 mg Intravenous Given 09/28/22 2206)  sodium chloride 0.9 % bolus 1,000 mL (1,000 mLs Intravenous Bolus 09/28/22 2210)    ED Course/ Medical Decision Making/ A&P   {   Click here for ABCD2, HEART and other calculatorsREFRESH Sanders before signing :1}                              Medical Decision Making Amount and/or Complexity of Data Reviewed Labs: ordered.  Risk Prescription drug management.   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 Patient stated he had bilateral kidney stents placed on Friday. He reports lower quadrant abdominal pain, 9/10. He is unable to tolerate anything PO. Patient denies eating today. Patient states he has had recent hospitalizations for the kidney stone procedures, the last on being back in Feb 2024. Patient denies fever, chills, headache, and dizziness. Patient endorses sharp abdominal pain, nausea/vomiting, and hematuria. He states he sometimes see clots in his urine, and it happens to occur more in the morning.   On exam, patient is sitting in bed appears slightly uncomfortable  but nontoxic in appearance.  Heart with normal rate and rhythm, lungs clear to auscultation bilaterally abdomen is soft with suprapubic tenderness on palpation.  No significant CVA tenderness noted.  -Labs ordered, independently viewed and interpreted by me.  Labs remarkable for UA showing large Hgb and moderate leukocytes along with 21-50 WBC -The patient was maintained on a cardiac monitor.  I personally viewed and interpreted the cardiac monitored which showed an underlying rhythm of: NSR -Imaging including renal US considered but not performed as urologist will evaluate pt -This patient presents to the ED for concern of abd pain, this involves an extensive  number of treatment options, and is a complaint that carries with it a high risk of complications and morbidity.  The differential diagnosis includes post stenting complication, obstructed uretheral stone, pyelonephritis, colitis. -Co morbidities that complicate the patient evaluation includes recurrent kidney stone -Treatment includes dilaudid, zofran, IVF -Reevaluation of the patient after these medicines showed that the patient improved -PCP office notes or outside notes reviewed -Discussion with specialist urologist Dr. Ronne Binning who will evaluate pt in the ER and will determine disposition -Escalation to admission/observation considered: dispo pending.    {Document critical care time when appropriate:1} {Document review of labs and clinical decision tools ie heart score, Chads2Vasc2 etc:1}  {Document your independent review of radiology images, and any outside records:1} {Document your discussion with family members, caretakers, and with consultants:1} {Document social determinants of health affecting pt's care:1} {Document your decision making why or why not admission, treatments were needed:1} Final Clinical Impression(s) / ED Diagnoses Final diagnoses:  Kidney stones    Rx / DC Orders ED Discharge Orders     None

## 2022-09-29 ENCOUNTER — Inpatient Hospital Stay (HOSPITAL_COMMUNITY): Payer: Commercial Managed Care - PPO

## 2022-09-29 ENCOUNTER — Emergency Department (HOSPITAL_COMMUNITY): Payer: Commercial Managed Care - PPO

## 2022-09-29 ENCOUNTER — Encounter (HOSPITAL_BASED_OUTPATIENT_CLINIC_OR_DEPARTMENT_OTHER): Payer: Self-pay | Admitting: Urology

## 2022-09-29 DIAGNOSIS — Z6831 Body mass index (BMI) 31.0-31.9, adult: Secondary | ICD-10-CM | POA: Diagnosis not present

## 2022-09-29 DIAGNOSIS — N2889 Other specified disorders of kidney and ureter: Secondary | ICD-10-CM | POA: Diagnosis present

## 2022-09-29 DIAGNOSIS — K219 Gastro-esophageal reflux disease without esophagitis: Secondary | ICD-10-CM | POA: Diagnosis present

## 2022-09-29 DIAGNOSIS — N136 Pyonephrosis: Secondary | ICD-10-CM | POA: Diagnosis present

## 2022-09-29 DIAGNOSIS — R52 Pain, unspecified: Secondary | ICD-10-CM | POA: Diagnosis not present

## 2022-09-29 DIAGNOSIS — E668 Other obesity: Secondary | ICD-10-CM | POA: Diagnosis present

## 2022-09-29 DIAGNOSIS — Z87442 Personal history of urinary calculi: Secondary | ICD-10-CM | POA: Diagnosis not present

## 2022-09-29 DIAGNOSIS — R208 Other disturbances of skin sensation: Secondary | ICD-10-CM | POA: Diagnosis not present

## 2022-09-29 DIAGNOSIS — Z91018 Allergy to other foods: Secondary | ICD-10-CM | POA: Diagnosis not present

## 2022-09-29 DIAGNOSIS — N200 Calculus of kidney: Secondary | ICD-10-CM | POA: Diagnosis present

## 2022-09-29 DIAGNOSIS — E7201 Cystinuria: Secondary | ICD-10-CM | POA: Diagnosis present

## 2022-09-29 DIAGNOSIS — R319 Hematuria, unspecified: Secondary | ICD-10-CM

## 2022-09-29 DIAGNOSIS — Z885 Allergy status to narcotic agent status: Secondary | ICD-10-CM | POA: Diagnosis not present

## 2022-09-29 DIAGNOSIS — N390 Urinary tract infection, site not specified: Secondary | ICD-10-CM

## 2022-09-29 DIAGNOSIS — N3001 Acute cystitis with hematuria: Secondary | ICD-10-CM | POA: Diagnosis not present

## 2022-09-29 DIAGNOSIS — Z8616 Personal history of COVID-19: Secondary | ICD-10-CM | POA: Diagnosis not present

## 2022-09-29 DIAGNOSIS — Z79899 Other long term (current) drug therapy: Secondary | ICD-10-CM | POA: Diagnosis not present

## 2022-09-29 DIAGNOSIS — Z9889 Other specified postprocedural states: Secondary | ICD-10-CM | POA: Diagnosis not present

## 2022-09-29 LAB — COMPREHENSIVE METABOLIC PANEL
ALT: 17 U/L (ref 0–44)
AST: 19 U/L (ref 15–41)
Albumin: 4 g/dL (ref 3.5–5.0)
Alkaline Phosphatase: 55 U/L (ref 38–126)
Anion gap: 9 (ref 5–15)
BUN: 17 mg/dL (ref 6–20)
CO2: 23 mmol/L (ref 22–32)
Calcium: 8.9 mg/dL (ref 8.9–10.3)
Chloride: 104 mmol/L (ref 98–111)
Creatinine, Ser: 0.91 mg/dL (ref 0.61–1.24)
GFR, Estimated: >60 mL/min (ref >60)
Glucose, Bld: 90 mg/dL (ref 70–99)
Potassium: 3.9 mmol/L (ref 3.5–5.1)
Sodium: 136 mmol/L (ref 135–145)
Total Bilirubin: 1 mg/dL (ref 0.3–1.2)
Total Protein: 6.5 g/dL (ref 6.5–8.1)

## 2022-09-29 LAB — CBC WITH DIFFERENTIAL/PLATELET
Abs Immature Granulocytes: 0.01 10*3/uL (ref 0.00–0.07)
Basophils Absolute: 0 10*3/uL (ref 0.0–0.1)
Basophils Relative: 0 %
Eosinophils Absolute: 0.2 10*3/uL (ref 0.0–0.5)
Eosinophils Relative: 3 %
HCT: 40.4 % (ref 39.0–52.0)
Hemoglobin: 13.6 g/dL (ref 13.0–17.0)
Immature Granulocytes: 0 %
Lymphocytes Relative: 16 %
Lymphs Abs: 1.1 10*3/uL (ref 0.7–4.0)
MCH: 33.3 pg (ref 26.0–34.0)
MCHC: 33.7 g/dL (ref 30.0–36.0)
MCV: 98.8 fL (ref 80.0–100.0)
Monocytes Absolute: 0.6 10*3/uL (ref 0.1–1.0)
Monocytes Relative: 8 %
Neutro Abs: 5.2 10*3/uL (ref 1.7–7.7)
Neutrophils Relative %: 73 %
Platelets: 236 10*3/uL (ref 150–400)
RBC: 4.09 MIL/uL — ABNORMAL LOW (ref 4.22–5.81)
RDW: 11.5 % (ref 11.5–15.5)
WBC: 7.1 10*3/uL (ref 4.0–10.5)
nRBC: 0 % (ref 0.0–0.2)

## 2022-09-29 LAB — APTT: aPTT: 25 seconds (ref 24–36)

## 2022-09-29 LAB — PROTIME-INR
INR: 1 (ref 0.8–1.2)
Prothrombin Time: 13.4 seconds (ref 11.4–15.2)

## 2022-09-29 LAB — ABO/RH: ABO/RH(D): AB POS

## 2022-09-29 LAB — TYPE AND SCREEN
ABO/RH(D): AB POS
Antibody Screen: NEGATIVE

## 2022-09-29 LAB — HIV ANTIBODY (ROUTINE TESTING W REFLEX): HIV Screen 4th Generation wRfx: NONREACTIVE

## 2022-09-29 MED ORDER — SODIUM CHLORIDE 0.9% FLUSH
3.0000 mL | Freq: Two times a day (BID) | INTRAVENOUS | Status: DC
  Administered 2022-09-30 – 2022-10-03 (×4): 3 mL via INTRAVENOUS

## 2022-09-29 MED ORDER — SODIUM CHLORIDE 0.9 % IV SOLN: INTRAVENOUS | Status: DC

## 2022-09-29 MED ORDER — DIPHENHYDRAMINE HCL 50 MG/ML IJ SOLN
25.0000 mg | Freq: Once | INTRAMUSCULAR | Status: AC
  Administered 2022-09-29: 25 mg via INTRAVENOUS
  Filled 2022-09-29: qty 1

## 2022-09-29 MED ORDER — ACETAMINOPHEN 325 MG PO TABS
650.0000 mg | ORAL_TABLET | Freq: Four times a day (QID) | ORAL | Status: DC | PRN
  Administered 2022-09-29: 650 mg via ORAL
  Filled 2022-09-29: qty 2

## 2022-09-29 MED ORDER — DIPHENHYDRAMINE HCL 25 MG PO CAPS: 25.0000 mg | ORAL_CAPSULE | Freq: Four times a day (QID) | ORAL | Status: DC | PRN

## 2022-09-29 MED ORDER — OXYCODONE HCL 5 MG PO TABS
5.0000 mg | ORAL_TABLET | ORAL | Status: DC | PRN
  Administered 2022-09-29 – 2022-10-01 (×4): 5 mg via ORAL
  Filled 2022-09-29 (×4): qty 1

## 2022-09-29 MED ORDER — KETOROLAC TROMETHAMINE 30 MG/ML IJ SOLN
15.0000 mg | Freq: Once | INTRAMUSCULAR | Status: AC
  Administered 2022-09-29: 15 mg via INTRAVENOUS
  Filled 2022-09-29: qty 1

## 2022-09-29 MED ORDER — HYDROMORPHONE HCL 1 MG/ML IJ SOLN
1.0000 mg | Freq: Once | INTRAMUSCULAR | Status: AC
  Administered 2022-09-29: 1 mg via INTRAVENOUS
  Filled 2022-09-29: qty 1

## 2022-09-29 MED ORDER — PROCHLORPERAZINE EDISYLATE 10 MG/2ML IJ SOLN
5.0000 mg | Freq: Once | INTRAMUSCULAR | Status: AC
  Administered 2022-09-29: 5 mg via INTRAVENOUS
  Filled 2022-09-29: qty 2

## 2022-09-29 MED ORDER — TIOPRONIN 300 MG PO TBEC: 300.0000 mg | DELAYED_RELEASE_TABLET | Freq: Three times a day (TID) | ORAL | Status: DC

## 2022-09-29 MED ORDER — FENTANYL CITRATE PF 50 MCG/ML IJ SOSY
50.0000 ug | PREFILLED_SYRINGE | Freq: Once | INTRAMUSCULAR | Status: AC
  Administered 2022-09-29: 50 ug via INTRAVENOUS
  Filled 2022-09-29: qty 1

## 2022-09-29 MED ORDER — HYDROMORPHONE HCL 1 MG/ML IJ SOLN
1.0000 mg | INTRAMUSCULAR | Status: DC | PRN
  Administered 2022-09-29 – 2022-10-03 (×19): 1 mg via INTRAVENOUS
  Filled 2022-09-29 (×20): qty 1

## 2022-09-29 MED ORDER — LACTATED RINGERS IV SOLN: INTRAVENOUS | Status: DC

## 2022-09-29 MED ORDER — KETOROLAC TROMETHAMINE 15 MG/ML IJ SOLN
15.0000 mg | Freq: Once | INTRAMUSCULAR | Status: AC
  Administered 2022-09-29: 15 mg via INTRAVENOUS
  Filled 2022-09-29: qty 1

## 2022-09-29 MED ORDER — HYDROMORPHONE HCL 1 MG/ML IJ SOLN
1.0000 mg | INTRAMUSCULAR | Status: DC | PRN
  Administered 2022-09-29: 1 mg via INTRAVENOUS
  Filled 2022-09-29: qty 1

## 2022-09-29 MED ORDER — DIPHENHYDRAMINE HCL 50 MG/ML IJ SOLN
50.0000 mg | Freq: Once | INTRAMUSCULAR | Status: AC
  Administered 2022-09-29: 50 mg via INTRAVENOUS
  Filled 2022-09-29: qty 1

## 2022-09-29 MED ORDER — SODIUM CHLORIDE 0.9 % IV SOLN
1.0000 g | INTRAVENOUS | Status: DC
  Administered 2022-09-29 – 2022-09-30 (×2): 1 g via INTRAVENOUS
  Filled 2022-09-29 (×2): qty 10

## 2022-09-29 MED ORDER — POLYETHYLENE GLYCOL 3350 17 G PO PACK: 17.0000 g | PACK | Freq: Every day | ORAL | Status: DC | PRN

## 2022-09-29 MED ORDER — ACETAMINOPHEN 650 MG RE SUPP: 650.0000 mg | Freq: Four times a day (QID) | RECTAL | Status: DC | PRN

## 2022-09-29 MED ORDER — ONDANSETRON HCL 4 MG/2ML IJ SOLN
4.0000 mg | Freq: Once | INTRAMUSCULAR | Status: AC
  Administered 2022-09-29: 4 mg via INTRAVENOUS
  Filled 2022-09-29: qty 2

## 2022-09-29 MED ORDER — DIPHENHYDRAMINE HCL 50 MG/ML IJ SOLN
25.0000 mg | Freq: Four times a day (QID) | INTRAMUSCULAR | Status: DC | PRN
  Administered 2022-09-29 – 2022-10-01 (×5): 25 mg via INTRAVENOUS
  Filled 2022-09-29 (×6): qty 1

## 2022-09-29 NOTE — H&P (Signed)
History and Physical    Patient: Devin Sanders:[id number] DOB: 09/26/1980 DOA: 09/28/2022 DOS: the patient was seen and examined on 09/29/2022 PCP: Pcp, No  Patient coming from: Home  Chief Complaint:  Chief Complaint  Patient presents with   Abdominal Pain   HPI: Devin Sanders is a 42 y.o. male with medical history significant of chronic renal calculi.  Patient actually underwent bilateral nephroscopy as well as lithotripsy and stent placement approximately 4 days ago by urology.  Patient reports since then that he has had persistent hematuria and bilateral lower abdominal anterior discomfort/achiness.  There is no dysuria no fever no nausea vomiting or diarrhea.  Pain has no aggravating or relieving factors.  Pain got worse today prompting the patient to come to the ER.  Patient had approximately 16-hour ER stay prior to being accepted by hospitalist service due to persistent pain.  Patient has received pain medications IV fluids.  Patient reports persistent hematuria.  However at this time no urine is available for inspection.  Patient reports good response to pain medication and discomfort currently comfortable.  Patient declined CAT scan for evaluation of pain.  Patient earlier removed bilateral ureteral stents in the ER Review of Systems: As mentioned in the history of present illness. All other systems reviewed and are negative. 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)   GERD (gastroesophageal reflux disease)    History of kidney stones    Left ureteral calculus    Renal calculus, left    Wears glasses    Past Surgical History:  Procedure Laterality Date   CYSTOSCOPY WITH RETROGRADE PYELOGRAM, URETEROSCOPY AND STENT PLACEMENT Bilateral 06/26/2017   Procedure: CYSTOSCOPY WITH RETROGRADE PYELOGRAM, URETEROSCOPY, STONE BASKETRY  AND STENT PLACEMENT;  Surgeon: [name], MD;  Location: [facility];  Service: Urology;  Laterality: Bilateral;   CYSTOSCOPY WITH RETROGRADE PYELOGRAM, URETEROSCOPY AND STENT PLACEMENT Right 09/18/2017   Procedure: CYSTOSCOPY WITH RETROGRADE PYELOGRAM, URETEROSCOPY AND STENT PLACEMENT;  Surgeon: Crist Fat, MD;  Location: [facility];  Service: Urology;  Laterality: Right;   CYSTOSCOPY WITH RETROGRADE PYELOGRAM, URETEROSCOPY AND STENT PLACEMENT Bilateral 10/13/2017   Procedure: CYSTOSCOPY WITH RETROGRADE PYELOGRAM, BILATERAL URETEROSCOPY AND BILATERAL STENT PLACEMENT, LASER;  Surgeon: [name], MD;  Location: [facility];  Service: Urology;  Laterality: Bilateral;  90 MINS   CYSTOSCOPY WITH RETROGRADE PYELOGRAM, URETEROSCOPY AND STENT PLACEMENT Bilateral 03/24/2018   Procedure: CYSTOSCOPY WITH RETROGRADE PYELOGRAM, URETEROSCOPY AND STENT PLACEMENT;  Surgeon: [name], MD;  Location: [facility];  Service: Urology;  Laterality: Bilateral;  1 HR   CYSTOSCOPY WITH RETROGRADE PYELOGRAM, URETEROSCOPY AND STENT PLACEMENT Bilateral 07/07/2018   Procedure: CYSTOSCOPY WITH RETROGRADE PYELOGRAM, URETEROSCOPY AND STENT PLACEMENT;  Surgeon: [name], MD;  Location: [facility];  Service: Urology;  Laterality: Bilateral;  75 MINUTES   CYSTOSCOPY WITH RETROGRADE PYELOGRAM, URETEROSCOPY AND STENT PLACEMENT Left 01/12/2019   Procedure: CYSTOSCOPY WITH RETROGRADE PYELOGRAM, URETEROSCOPY AND STENT PLACEMENT;  Surgeon: [name], MD;  Location: [facility];  Service: Urology;  Laterality: Left;  90 MINS   CYSTOSCOPY WITH RETROGRADE PYELOGRAM, URETEROSCOPY AND STENT PLACEMENT Left 01/28/2019   Procedure: CYSTOSCOPY WITH RETROGRADE PYELOGRAM, URETEROSCOPY AND STENT EXCHANGE STONE BASKET EXTRACTION ;  Surgeon: [name], MD;  Location: [facility];  Service: Urology;  Laterality: Left;   CYSTOSCOPY WITH RETROGRADE PYELOGRAM, URETEROSCOPY AND STENT PLACEMENT Bilateral 05/13/2019  Procedure: CYSTOSCOPY WITH BILATERAL   RETROGRADE PYELOGRAM, LEFT URETEROSCOPY WITH HOLMIUM LASER AND STENT PLACEMENT;  Surgeon: [name], MD;  Location: [facility];  Service: Urology;  Laterality: Bilateral;   CYSTOSCOPY WITH RETROGRADE PYELOGRAM, URETEROSCOPY AND STENT PLACEMENT Bilateral 11/02/2019   Procedure: CYSTOSCOPY WITH RETROGRADE PYELOGRAM, URETEROSCOPY AND STENT PLACEMENT;  Surgeon: [name], MD;  Location: [facility];  Service: Urology;  Laterality: Bilateral;  90 MINS   CYSTOSCOPY WITH RETROGRADE PYELOGRAM, URETEROSCOPY AND STENT PLACEMENT Bilateral 10/12/2020   Procedure: CYSTOSCOPY WITH RETROGRADE PYELOGRAM, URETEROSCOPY, BASKETING OF STONES AND BILATERAL STENT PLACEMENTS;  Surgeon: [name], MD;  Location: [facility];  Service: Urology;  Laterality: Bilateral;  75 MINS   CYSTOSCOPY WITH RETROGRADE PYELOGRAM, URETEROSCOPY AND STENT PLACEMENT Bilateral 02/15/2021   Procedure: CYSTOSCOPY WITH RETROGRADE PYELOGRAM, URETEROSCOPY AND STENT PLACEMENT;  Surgeon: [name], MD;  Location[facility];  Service: Urology;  Laterality: Bilateral;  75 MINS   CYSTOSCOPY WITH RETROGRADE PYELOGRAM, URETEROSCOPY AND STENT PLACEMENT Bilateral 09/27/2021   Procedure: CYSTOSCOPY WITH RETROGRADE PYELOGRAM, URETEROSCOPY AND STENT PLACEMENT, VASECTOMY;  Surgeon: [name], MD;  Location: [facility];  Service: Urology;  Laterality: Bilateral;  75 MINS   CYSTOSCOPY WITH RETROGRADE PYELOGRAM, URETEROSCOPY AND STENT PLACEMENT Bilateral 02/12/2022   Procedure: CYSTOSCOPY WITH RETROGRADE PYELOGRAM, URETEROSCOPY AND STENT PLACEMENT;  Surgeon: [name], MD;  Location: [facility];  Service: Urology;  Laterality: Bilateral;  75 MINS   CYSTOSCOPY/RETROGRADE/URETEROSCOPY/STONE EXTRACTION WITH BASKET Left 10/05/2017   Procedure: CYSTOSCOPY/RETROGRADE/STONE REMOVAL FROM BLADDER ;  Surgeon: Bjorn Pippin, MD;  Location: [facility];  Service: Urology;  Laterality: Left;   CYSTOSCOPY/URETEROSCOPY/HOLMIUM LASER/STENT PLACEMENT Right 09/28/2017    Procedure: CYSTOSCOPY/RIGHT RETROGRADE URETEROSCOPY/HOLMIUM LASER/ RIGHT STENT PLACEMENT;  Surgeon: Rene Paci, MD;  Location: [facility];  Service: Urology;  Laterality: Right;   CYSTOSCOPY/URETEROSCOPY/HOLMIUM LASER/STENT PLACEMENT Bilateral 01/27/2018   Procedure: CYSTOSCOPY/URETEROSCOPY/HOLMIUM LASER/STENT PLACEMENT;  Surgeon: [name], MD;  Location: [facility];  Service: Urology;  Laterality: Bilateral;   CYSTOSCOPY/URETEROSCOPY/HOLMIUM LASER/STENT PLACEMENT Right 08/08/2020   Procedure: CYSTOSCOPY/URETEROSCOPY/RETROGRADE/ HOLMIUM LASER/STENT PLACEMENT;  Surgeon: Marcine Matar, MD;  Location[facility];  Service: Urology;  Laterality: Right;   CYSTOSCOPY/URETEROSCOPY/HOLMIUM LASER/STENT PLACEMENT Bilateral 09/26/2022   Procedure: CYSTOSCOPY BILATERAL RETROGRADE URETEROSCOPY/HOLMIUM LASER/STENT PLACEMENT;  Surgeon: Loletta Parish., MD;  Location: [facility];  Service: Urology;  Laterality: Bilateral;   HOLMIUM LASER APPLICATION Bilateral 06/26/2017   Procedure: HOLMIUM LASER APPLICATION;  Surgeon: [name], MD;  Location: [facility];  Service: Urology;  Laterality: Bilateral;   HOLMIUM LASER APPLICATION Right 09/18/2017   Procedure: HOLMIUM LASER APPLICATION;  Surgeon: Crist Fat, MD;  Location: [facility];  Service: Urology;  Laterality: Right;   HOLMIUM LASER APPLICATION Bilateral 10/13/2017   Procedure: HOLMIUM LASER APPLICATION;  Surgeon: [name], MD;  Location: [facility];  Service: Urology;  Laterality: Bilateral;   HOLMIUM LASER APPLICATION Bilateral 01/27/2018   Procedure: HOLMIUM LASER APPLICATION;  Surgeon: [name], MD;  Location: [facility];  Service: Urology;  Laterality: Bilateral;   HOLMIUM LASER APPLICATION Bilateral 03/24/2018   Procedure: HOLMIUM LASER APPLICATION;  Surgeon: [name], MD;  Location: [facility];  Service: Urology;  Laterality: Bilateral;   HOLMIUM LASER  APPLICATION Bilateral 07/07/2018   Procedure: HOLMIUM LASER APPLICATION;  Surgeon: [name], MD;  Location[facility];  Service: Urology;  Laterality: Bilateral;   HOLMIUM LASER APPLICATION Left 01/12/2019   Procedure: HOLMIUM LASER APPLICATION;  Surgeon: [name], MD;  Location[facility];  Service:  Urology;  Laterality: Left;   HOLMIUM LASER APPLICATION Bilateral 11/02/2019   Procedure: HOLMIUM LASER APPLICATION;  Surgeon: [name], MD;  Location: [facility];  Service: Urology;  Laterality: Bilateral;   HOLMIUM LASER APPLICATION Bilateral 02/15/2021   Procedure: HOLMIUM LASER APPLICATION;  Surgeon: [name], MD;  Location: [facility];  Service: Urology;  Laterality: Bilateral;   HOLMIUM LASER APPLICATION Bilateral 09/27/2021   Procedure: HOLMIUM LASER APPLICATION;  Surgeon: [name], MD;  Location[facility];  Service: Urology;  Laterality: Bilateral;   HOLMIUM LASER APPLICATION Bilateral 02/12/2022   Procedure: HOLMIUM LASER APPLICATION;  Surgeon: [name], MD;  Location: [facility];  Service: Urology;  Laterality: Bilateral;   PERCUTANEOUS NEPHROSTOLITHOTOMY  2003;  2009;  06-22-2014; 01-30-2015  @ [facility]   URETEROSCOPIC STONE MANIPULATION UNILATERAL  multiple since 1997--  last one 02-19-2017  @ [facility]   Social History:  reports that he has never smoked. He has never used smokeless tobacco. He reports that he does not drink alcohol and does not use drugs.  Allergies  Allergen Reactions   Morphine Nausea And Vomiting   Other Hives and Swelling    Mangos    History reviewed. No pertinent family history.  Prior to Admission medications   Medication Sig Start Date End Date Taking? Authorizing Provider  cephALEXin (KEFLEX) 500 MG capsule Take 1 capsule (500 mg total) by mouth 2 (two) times daily for 3 days. To prevent infection with tethered stents 09/26/22 09/29/22 Yes Manny, Delbert Phenix., MD  ketorolac (TORADOL) 10 MG tablet Take 1 tablet (10 mg total) by  mouth every 8 (eight) hours as needed for moderate pain (or stent discomfort post-operatively). 09/26/22  Yes Manny, Delbert Phenix., MD  ondansetron (ZOFRAN-ODT) 4 MG disintegrating tablet Take 1 tablet (4 mg total) by mouth every 8 (eight) hours as needed for nausea or vomiting. Patient taking differently: Take 4 mg by mouth every 8 (eight) hours as needed for nausea or vomiting (DISSOLVE ORALLY). 08/31/22  Yes Elson Areas, PA-C  oxyCODONE-acetaminophen (PERCOCET) 5-325 MG tablet Take 1 tablet by mouth every 6 (six) hours as needed for severe pain (post-operatively). 09/26/22 09/26/23 Yes Manny, Delbert Phenix., MD  THIOLA EC 300 MG TBEC Take 300 mg by mouth in the morning, at noon, and at bedtime. 08/30/21  Yes [provider]    Physical Exam: Vitals:   09/29/22 1330 09/29/22 1430 09/29/22 1500 09/29/22 1655  BP: 111/84 120/83 129/84 (!) 137/100  Pulse: 84 86 (!) 105 83  Resp: 16 16 16 18   Temp:    97.9 F (36.6 C)  TempSrc:    Oral  SpO2: 96% 94% 93% 96%  Weight:    98.4 kg  Height:    5\' 10"  (1.778 m)   General:  does not appear to be distressed at this time.  Patient reports very well-controlled/negligible pain at this time Respiratory exam: Bilateral air entry clear Cardiovascular exam: S1-S2 normal Abdomen all quadrants are soft nontender External genitalia appears healthy.  Patient earlier removed bilateral stents Extremities: Warm no edema No flank tenderness Data Reviewed:  Labs on Admission:  Results for orders placed or performed during the hospital encounter of 09/28/22 (from the past 24 hour(s))  Urinalysis, Routine w reflex microscopic -Urine, Clean Catch     Status: Abnormal   Collection Time: 09/28/22  8:01 PM  Result Value Ref Range   Color, Urine YELLOW YELLOW   APPearance HAZY (A) CLEAR   Specific Gravity, Urine 1.025  1.005 - 1.030   pH 6.0 5.0 - 8.0   Glucose, UA NEGATIVE NEGATIVE mg/dL   Hgb urine dipstick LARGE (A) NEGATIVE   Bilirubin Urine  NEGATIVE NEGATIVE   Ketones, ur NEGATIVE NEGATIVE mg/dL   Protein, ur >=161 (A) NEGATIVE mg/dL   Nitrite NEGATIVE NEGATIVE   Leukocytes,Ua MODERATE (A) NEGATIVE   RBC / HPF >50 0 - 5 RBC/hpf   WBC, UA 21-50 0 - 5 WBC/hpf   Bacteria, UA NONE SEEN NONE SEEN   Squamous Epithelial / HPF 0-5 0 - 5 /HPF  Lipase, blood     Status: None   Collection Time: 09/28/22  8:05 PM  Result Value Ref Range   Lipase 29 11 - 51 U/L  Comprehensive metabolic panel     Status: Abnormal   Collection Time: 09/28/22  8:05 PM  Result Value Ref Range   Sodium 137 135 - 145 mmol/L   Potassium 3.8 3.5 - 5.1 mmol/L   Chloride 107 98 - 111 mmol/L   CO2 23 22 - 32 mmol/L   Glucose, Bld 96 70 - 99 mg/dL   BUN 27 (H) 6 - 20 mg/dL   Creatinine, Ser 0.96 0.61 - 1.24 mg/dL   Calcium 8.6 (L) 8.9 - 10.3 mg/dL   Total Protein 6.9 6.5 - 8.1 g/dL   Albumin 4.1 3.5 - 5.0 g/dL   AST 15 15 - 41 U/L   ALT 15 0 - 44 U/L   Alkaline Phosphatase 55 38 - 126 U/L   Total Bilirubin 0.7 0.3 - 1.2 mg/dL   GFR, Estimated >04 >54 mL/min   Anion gap 7 5 - 15  CBC     Status: Abnormal   Collection Time: 09/28/22  8:05 PM  Result Value Ref Range   WBC 6.5 4.0 - 10.5 K/uL   RBC 3.99 (L) 4.22 - 5.81 MIL/uL   Hemoglobin 13.5 13.0 - 17.0 g/dL   HCT 09.8 11.9 - 14.7 %   MCV 99.7 80.0 - 100.0 fL   MCH 33.8 26.0 - 34.0 pg   MCHC 33.9 30.0 - 36.0 g/dL   RDW 82.9 56.2 - 13.0 %   Platelets 255 150 - 400 K/uL   nRBC 0.0 0.0 - 0.2 %   Basic Metabolic Panel: Recent Labs  Lab 09/28/22 2005  NA 137  K 3.8  CL 107  CO2 23  GLUCOSE 96  BUN 27*  CREATININE 1.02  CALCIUM 8.6*   Liver Function Tests: Recent Labs  Lab 09/28/22 2005  AST 15  ALT 15  ALKPHOS 55  BILITOT 0.7  PROT 6.9  ALBUMIN 4.1   Recent Labs  Lab 09/28/22 2005  LIPASE 29   No results for input(s): "AMMONIA" in the last 168 hours. CBC: Recent Labs  Lab 09/28/22 2005  WBC 6.5  HGB 13.5  HCT 39.8  MCV 99.7  PLT 255   Cardiac Enzymes: No results  for input(s): "CKTOTAL", "CKMB", "CKMBINDEX", "TROPONINIHS" in the last 168 hours.  BNP (last 3 results) No results for input(s): "PROBNP" in the last 8760 hours. CBG: No results for input(s): "GLUCAP" in the last 168 hours.  Radiological Exams on Admission: I reviewed images of patient's x-ray abdomen, I did not appreciate any stones or any free air under the diaphragm. No results found.    Assessment and Plan: * Hematuria Check inr ptt, hold any a/c. Send type and screen. Check cbc. Started in ER. Pending US renal. Pending Urology eval  UTI (urinary tract infection)  Patient had ureteral stenting done about 4 days ago, has been reporting bilateral lower abdominal anterior discomfort since then with associated hematuria, finding of leukocytes in the urine.  Patient has this time has removed bilateral ureteral stenting based on urology input.  At this time urine cultures are pending.  We will treat with ceftriaxone.  Pain has improved thus far.  Patient is tolerating p.o. therefore at this time we will transition patient to oral pain regimen.  Etiology of pain is felt to be urinary tract infection at this time.  On physical exam and other occult exam/pathology is felt to be unlikely.      Advance Care Planning:   Code Status: Full Code   Consults: urology eval noted.  Family Communication: Per patient  Severity of Illness: The appropriate patient status for this patient is OBSERVATION. Observation status is judged to be reasonable and necessary in order to provide the required intensity of service to ensure the patient's safety. The patient's presenting symptoms, physical exam findings, and initial radiographic and laboratory data in the context of their medical condition is felt to place them at decreased risk for further clinical deterioration. Furthermore, it is anticipated that the patient will be medically stable for discharge from the hospital within 2 midnights of admission.    Author: Nolberto Hanlon, MD 09/29/2022 6:04 PM  For on call review [email or web address].

## 2022-09-29 NOTE — ED Provider Notes (Signed)
Known nephrolithiasis, stents on 09/26/22. Here for uncontrolled pain.   Urologist: Dr. Berneice Heinrich  This 42 year old male, history of recurrent nephrolithiasis, followed by Dr. Urban Gibson, is here for uncontrolled pain, from a procedure done about 4 days ago.  He states he had a cystoscopy, and bilateral ureteroscopy with laser lithotripsy and insertion of bilateral ureteral stents.  He has been in the ER for 15+ hours, and has had multiple handover's.  Night PA, contacted urology on-call, who recommended admission, but with urology a.m. to evaluate.  Patient stayed in the ER overnight, and urology was unable to evaluate today given multiple surgeries.  Marchelle Folks, daytime PA, spoke with Dr. Sande Brothers, who requested a CT renal study to evaluate patient, patient declined, as he states that Dr. Berneice Heinrich recommended that he not get another CT as he recurrently has the stones, to prevent further radiation.  Dr. Sande Brothers was reconsulted by Marchelle Folks PA, and recommends admission for pain control, admit to hospitalist.  Physical Exam  BP 120/83   Pulse 86   Temp 97.9 F (36.6 C)   Resp 16   Ht 5\' 10"  (1.778 m)   Wt 96.6 kg   SpO2 94%   BMI 30.56 kg/m   Physical Exam Vitals and nursing note reviewed.  Constitutional:      General: He is not in acute distress.    Appearance: He is well-developed.  HENT:     Head: Normocephalic and atraumatic.  Eyes:     Conjunctiva/sclera: Conjunctivae normal.  Cardiovascular:     Rate and Rhythm: Normal rate and regular rhythm.     Heart sounds: No murmur heard. Pulmonary:     Effort: Pulmonary effort is normal. No respiratory distress.     Breath sounds: Normal breath sounds.  Abdominal:     Palpations: Abdomen is soft.     Tenderness: There is no abdominal tenderness.  Musculoskeletal:        General: No swelling.     Cervical back: Neck supple.  Skin:    General: Skin is warm and dry.     Capillary Refill: Capillary refill takes less than 2 seconds.  Neurological:      Mental Status: He is alert.  Psychiatric:        Mood and Affect: Mood normal.     Procedures  Procedures  ED Course / MDM    Medical Decision Making Amount and/or Complexity of Data Reviewed Labs: ordered. Radiology: ordered.  Risk Prescription drug management. Decision regarding hospitalization.   Spoke with Dr. Maryjean Ka, he accepts admission of the patient, for pain control, and urology consult in the a.m.  Request ultrasound renal, given the recurrent pain, ordered, he will follow-up with results.      Pete Pelt, [place] 09/29/22 1601    Gloris Manchester, MD 09/30/22 [phone]

## 2022-09-29 NOTE — ED Provider Notes (Signed)
12:12 AM Patient requesting additional medications for pain. Dilaudid also causes itching. Benadryl added. Pending Urology rec's.  3:25 AM Requesting nausea medications. Additional Zofran ordered. Urologic consult in ED remains pending.  5:22 AM Urology paged to get timeline on ED consultation.  5:54 AM Repeat page to Urology.  5:57 AM Spoke with MD McKenzie of [facility] regarding patient. States that patient is well known to their practice; has required admissions for pain control in the past. Dr. Ronne Binning to speak with Dr. Berneice Heinrich this morning regarding disposition plan. Suspects repeat admission for pain control may be indicated.  6:05 AM Call back from MD McKenzie. Per his review of the patient's chart, stents are on strings and can be removed by the patient. Patient advised to remove his stents to see if this helps his pain. Dr. Ronne Binning will still plan on assessing patient this morning to see if patient's pain has improved once stents removed. If pain improved with removal, patient can likely be discharged.   6:15 AM Patient ambulated to bathroom for stent removal.  6:34 AM Care signed out to [place], PA-C at shift change pending Urology input. Appreciate their assistance in the care of this patient.   Antony Madura, PA-C 09/29/22 8413    Gilda Crease, MD 09/30/22 [phone]

## 2022-09-29 NOTE — Consult Note (Signed)
Urology Consult   Physician requesting consult: Nolberto Hanlon, MD   Reason for consult: Renal colic   History of Present Illness: Devin Sanders is a 42 y.o. male with a history of cystinuria with recurrent cysteine stones.  He is s/p bilateral ureteroscopy with laser lithotripsy on 09/26/22 with Dr. Berneice Heinrich.  He presented to the [facility] ED with intermittent, crampy lower abdominal pain that radiates to the flanks following surgery.  His stents have since been removed while in the ED, which did not improve his pain.  No imaging has been obtained to assess for residual stone burden and/or hydronephrosis as the source of his pain.     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)   GERD (gastroesophageal reflux disease)    History of kidney stones    Left ureteral calculus    Renal calculus, left    Wears glasses     Past Surgical History:  Procedure Laterality Date   CYSTOSCOPY WITH RETROGRADE PYELOGRAM, URETEROSCOPY AND STENT PLACEMENT Bilateral 06/26/2017   Procedure: CYSTOSCOPY WITH RETROGRADE PYELOGRAM, URETEROSCOPY, STONE BASKETRY  AND STENT PLACEMENT;  Surgeon: [name], MD;  Location: [facility];  Service: Urology;  Laterality: Bilateral;   CYSTOSCOPY WITH RETROGRADE PYELOGRAM, URETEROSCOPY AND STENT PLACEMENT Right 09/18/2017   Procedure: CYSTOSCOPY WITH RETROGRADE PYELOGRAM, URETEROSCOPY AND STENT PLACEMENT;  Surgeon: Crist Fat, MD;  Location: [facility];  Service: Urology;  Laterality: Right;   CYSTOSCOPY WITH RETROGRADE PYELOGRAM, URETEROSCOPY AND STENT PLACEMENT Bilateral 10/13/2017   Procedure: CYSTOSCOPY WITH RETROGRADE PYELOGRAM, BILATERAL URETEROSCOPY AND BILATERAL STENT PLACEMENT, LASER;  Surgeon: [name], MD;  Location: [facility];  Service: Urology;  Laterality: Bilateral;  90 MINS   CYSTOSCOPY WITH RETROGRADE PYELOGRAM, URETEROSCOPY AND STENT PLACEMENT  Bilateral 03/24/2018   Procedure: CYSTOSCOPY WITH RETROGRADE PYELOGRAM, URETEROSCOPY AND STENT PLACEMENT;  Surgeon: [name], MD;  Location: [facility];  Service: Urology;  Laterality: Bilateral;  1 HR   CYSTOSCOPY WITH RETROGRADE PYELOGRAM, URETEROSCOPY AND STENT PLACEMENT Bilateral 07/07/2018   Procedure: CYSTOSCOPY WITH RETROGRADE PYELOGRAM, URETEROSCOPY AND STENT PLACEMENT;  Surgeon: [name], MD;  Location: [facility];  Service: Urology;  Laterality: Bilateral;  75 MINUTES   CYSTOSCOPY WITH RETROGRADE PYELOGRAM, URETEROSCOPY AND STENT PLACEMENT Left 01/12/2019   Procedure: CYSTOSCOPY WITH RETROGRADE PYELOGRAM, URETEROSCOPY AND STENT PLACEMENT;  Surgeon: [name], MD;  Location: [facility];  Service: Urology;  Laterality: Left;  90 MINS   CYSTOSCOPY WITH RETROGRADE PYELOGRAM, URETEROSCOPY AND STENT PLACEMENT Left 01/28/2019   Procedure: CYSTOSCOPY WITH RETROGRADE PYELOGRAM, URETEROSCOPY AND STENT EXCHANGE STONE BASKET EXTRACTION ;  Surgeon: [name], MD;  Location: [facility];  Service: Urology;  Laterality: Left;   CYSTOSCOPY WITH RETROGRADE PYELOGRAM, URETEROSCOPY AND STENT PLACEMENT Bilateral 05/13/2019   Procedure: CYSTOSCOPY WITH BILATERAL  RETROGRADE PYELOGRAM, LEFT URETEROSCOPY WITH HOLMIUM LASER AND STENT PLACEMENT;  Surgeon: [name], MD;  Location: [facility];  Service: Urology;  Laterality: Bilateral;   CYSTOSCOPY WITH RETROGRADE PYELOGRAM, URETEROSCOPY AND STENT PLACEMENT Bilateral 11/02/2019   Procedure: CYSTOSCOPY WITH RETROGRADE PYELOGRAM, URETEROSCOPY AND STENT PLACEMENT;  Surgeon: [name], MD;  Location: [facility];  Service: Urology;  Laterality: Bilateral;  90 MINS   CYSTOSCOPY WITH RETROGRADE PYELOGRAM, URETEROSCOPY AND STENT PLACEMENT Bilateral 10/12/2020   Procedure: CYSTOSCOPY WITH RETROGRADE PYELOGRAM, URETEROSCOPY, BASKETING OF STONES AND BILATERAL STENT PLACEMENTS;  Surgeon: [name], MD;  Location: [facility];  Service:  Urology;  Laterality: Bilateral;  75 MINS   CYSTOSCOPY WITH RETROGRADE PYELOGRAM, URETEROSCOPY AND STENT PLACEMENT Bilateral 02/15/2021   Procedure: CYSTOSCOPY WITH RETROGRADE PYELOGRAM, URETEROSCOPY AND STENT PLACEMENT;  Surgeon: [name], MD;  Location[facility];  Service: Urology;  Laterality: Bilateral;  75 MINS   CYSTOSCOPY WITH RETROGRADE PYELOGRAM, URETEROSCOPY AND STENT PLACEMENT Bilateral 09/27/2021   Procedure: CYSTOSCOPY WITH RETROGRADE PYELOGRAM, URETEROSCOPY AND STENT PLACEMENT, VASECTOMY;  Surgeon: [name], MD;  Location: [facility];  Service: Urology;  Laterality: Bilateral;  75 MINS   CYSTOSCOPY WITH RETROGRADE PYELOGRAM, URETEROSCOPY AND STENT PLACEMENT Bilateral 02/12/2022   Procedure: CYSTOSCOPY WITH RETROGRADE PYELOGRAM, URETEROSCOPY AND STENT PLACEMENT;  Surgeon: [name], MD;  Location: [facility];  Service: Urology;  Laterality: Bilateral;  75 MINS   CYSTOSCOPY/RETROGRADE/URETEROSCOPY/STONE EXTRACTION WITH BASKET Left 10/05/2017   Procedure: CYSTOSCOPY/RETROGRADE/STONE REMOVAL FROM BLADDER ;  Surgeon: Bjorn Pippin, MD;  Location: [facility];  Service: Urology;  Laterality: Left;   CYSTOSCOPY/URETEROSCOPY/HOLMIUM LASER/STENT PLACEMENT Right 09/28/2017   Procedure: CYSTOSCOPY/RIGHT RETROGRADE URETEROSCOPY/HOLMIUM LASER/ RIGHT STENT PLACEMENT;  Surgeon: Rene Paci, MD;  Location: [facility];  Service: Urology;  Laterality: Right;   CYSTOSCOPY/URETEROSCOPY/HOLMIUM LASER/STENT PLACEMENT Bilateral 01/27/2018   Procedure: CYSTOSCOPY/URETEROSCOPY/HOLMIUM LASER/STENT PLACEMENT;  Surgeon: [name], MD;  Location: [facility];  Service: Urology;  Laterality: Bilateral;   CYSTOSCOPY/URETEROSCOPY/HOLMIUM LASER/STENT PLACEMENT Right 08/08/2020   Procedure: CYSTOSCOPY/URETEROSCOPY/RETROGRADE/ HOLMIUM LASER/STENT PLACEMENT;  Surgeon: Marcine Matar, MD;  Location[facility];  Service: Urology;  Laterality: Right;   CYSTOSCOPY/URETEROSCOPY/HOLMIUM LASER/STENT  PLACEMENT Bilateral 09/26/2022   Procedure: CYSTOSCOPY BILATERAL RETROGRADE URETEROSCOPY/HOLMIUM LASER/STENT PLACEMENT;  Surgeon: Loletta Parish., MD;  Location: [facility];  Service: Urology;  Laterality: Bilateral;   HOLMIUM LASER APPLICATION Bilateral 06/26/2017   Procedure: HOLMIUM LASER APPLICATION;  Surgeon: [name], MD;  Location: [facility];  Service: Urology;  Laterality: Bilateral;   HOLMIUM LASER APPLICATION Right 09/18/2017   Procedure: HOLMIUM LASER APPLICATION;  Surgeon: Crist Fat, MD;  Location: [facility];  Service: Urology;  Laterality: Right;   HOLMIUM LASER APPLICATION Bilateral 10/13/2017   Procedure: HOLMIUM LASER APPLICATION;  Surgeon: [name], MD;  Location: [facility];  Service: Urology;  Laterality: Bilateral;   HOLMIUM LASER APPLICATION Bilateral 01/27/2018   Procedure: HOLMIUM LASER APPLICATION;  Surgeon: [name], MD;  Location: [facility];  Service: Urology;  Laterality: Bilateral;   HOLMIUM LASER APPLICATION Bilateral 03/24/2018   Procedure: HOLMIUM LASER APPLICATION;  Surgeon: [name], MD;  Location[facility];  Service: Urology;  Laterality: Bilateral;   HOLMIUM LASER APPLICATION Bilateral 07/07/2018   Procedure: HOLMIUM LASER APPLICATION;  Surgeon: [name], MD;  Location[facility];  Service: Urology;  Laterality: Bilateral;   HOLMIUM LASER APPLICATION Left 01/12/2019   Procedure: HOLMIUM LASER APPLICATION;  Surgeon: [name], MD;  Location[facility];  Service: Urology;  Laterality: Left;   HOLMIUM LASER APPLICATION Bilateral 11/02/2019   Procedure: HOLMIUM LASER APPLICATION;  Surgeon: [name], MD;  Location: [facility];  Service: Urology;  Laterality: Bilateral;   HOLMIUM LASER APPLICATION Bilateral 02/15/2021   Procedure: HOLMIUM LASER APPLICATION;  Surgeon: [name], MD;  Location: [facility];  Service: Urology;  Laterality: Bilateral;   HOLMIUM LASER  APPLICATION Bilateral 09/27/2021   Procedure: HOLMIUM LASER APPLICATION;  Surgeon: [name], MD;  Location: [facility];  Service: Urology;  Laterality: Bilateral;   HOLMIUM LASER APPLICATION Bilateral 02/12/2022   Procedure: HOLMIUM LASER APPLICATION;  Surgeon: [name], MD;  Location:  [facility];  Service: Urology;  Laterality: Bilateral;   PERCUTANEOUS NEPHROSTOLITHOTOMY  2003;  2009;  06-22-2014; 01-30-2015  @ [facility]   URETEROSCOPIC STONE MANIPULATION UNILATERAL  multiple since 1997--  last one 02-19-2017  @ [facility]    Current Hospital Medications:  Home Meds:  Current Meds  Medication Sig   cephALEXin (KEFLEX) 500 MG capsule Take 1 capsule (500 mg total) by mouth 2 (two) times daily for 3 days. To prevent infection with tethered stents   ketorolac (TORADOL) 10 MG tablet Take 1 tablet (10 mg total) by mouth every 8 (eight) hours as needed for moderate pain (or stent discomfort post-operatively).   ondansetron (ZOFRAN-ODT) 4 MG disintegrating tablet Take 1 tablet (4 mg total) by mouth every 8 (eight) hours as needed for nausea or vomiting. (Patient taking differently: Take 4 mg by mouth every 8 (eight) hours as needed for nausea or vomiting (DISSOLVE ORALLY).)   oxyCODONE-acetaminophen (PERCOCET) 5-325 MG tablet Take 1 tablet by mouth every 6 (six) hours as needed for severe pain (post-operatively).   THIOLA EC 300 MG TBEC Take 300 mg by mouth in the morning, at noon, and at bedtime.    Scheduled Meds:  sodium chloride flush  3 mL Intravenous Q12H   Continuous Infusions:  cefTRIAXone (ROCEPHIN)  IV     lactated ringers 125 mL/hr at 09/29/22 1552   PRN Meds:.acetaminophen **OR** acetaminophen, polyethylene glycol  Allergies:  Allergies  Allergen Reactions   Morphine Nausea And Vomiting   Other Hives and Swelling    Mangos    History reviewed. No pertinent family history.  Social History:  reports that he has never smoked. He has never used smokeless tobacco. He reports that he  does not drink alcohol and does not use drugs.  ROS: A complete review of systems was performed.  All systems are negative except for pertinent findings as noted.  Physical Exam:  Vital signs in last 24 hours: Temp:  [97.7 F (36.5 C)-97.9 F (36.6 C)] 97.9 F (36.6 C) (09/23 1325) Pulse Rate:  [64-105] 105 (09/23 1500) Resp:  [16-20] 16 (09/23 1500) BP: (111-150)/(79-103) 129/84 (09/23 1500) SpO2:  [93 %-97 %] 93 % (09/23 1500) Weight:  [96.6 kg] 96.6 kg (09/22 1942) Constitutional:  Alert and oriented, No acute distress Cardiovascular: Regular rate and rhythm, No JVD Respiratory: Normal respiratory effort, Lungs clear bilaterally GI: Abdomen is soft, nontender, nondistended, no abdominal masses GU: No CVA tenderness Lymphatic: No lymphadenopathy Neurologic: Grossly intact, no focal deficits Psychiatric: Normal mood and affect  Laboratory Data:  Recent Labs    09/28/22 2005  WBC 6.5  HGB 13.5  HCT 39.8  PLT 255    Recent Labs    09/28/22 2005  NA 137  K 3.8  CL 107  GLUCOSE 96  BUN 27*  CALCIUM 8.6*  CREATININE 1.02     Results for orders placed or performed during the hospital encounter of 09/28/22 (from the past 24 hour(s))  Urinalysis, Routine w reflex microscopic -Urine, Clean Catch     Status: Abnormal   Collection Time: 09/28/22  8:01 PM  Result Value Ref Range   Color, Urine YELLOW YELLOW   APPearance HAZY (A) CLEAR   Specific Gravity, Urine 1.025 1.005 - 1.030   pH 6.0 5.0 - 8.0   Glucose, UA NEGATIVE NEGATIVE mg/dL   Hgb urine dipstick LARGE (A) NEGATIVE   Bilirubin Urine NEGATIVE NEGATIVE   Ketones, ur NEGATIVE NEGATIVE mg/dL   Protein, ur >=409 (A) NEGATIVE mg/dL  Nitrite NEGATIVE NEGATIVE   Leukocytes,Ua MODERATE (A) NEGATIVE   RBC / HPF >50 0 - 5 RBC/hpf   WBC, UA 21-50 0 - 5 WBC/hpf   Bacteria, UA NONE SEEN NONE SEEN   Squamous Epithelial / HPF 0-5 0 - 5 /HPF  Lipase, blood     Status: None   Collection Time: 09/28/22  8:05 PM   Result Value Ref Range   Lipase 29 11 - 51 U/L  Comprehensive metabolic panel     Status: Abnormal   Collection Time: 09/28/22  8:05 PM  Result Value Ref Range   Sodium 137 135 - 145 mmol/L   Potassium 3.8 3.5 - 5.1 mmol/L   Chloride 107 98 - 111 mmol/L   CO2 23 22 - 32 mmol/L   Glucose, Bld 96 70 - 99 mg/dL   BUN 27 (H) 6 - 20 mg/dL   Creatinine, Ser 1.61 0.61 - 1.24 mg/dL   Calcium 8.6 (L) 8.9 - 10.3 mg/dL   Total Protein 6.9 6.5 - 8.1 g/dL   Albumin 4.1 3.5 - 5.0 g/dL   AST 15 15 - 41 U/L   ALT 15 0 - 44 U/L   Alkaline Phosphatase 55 38 - 126 U/L   Total Bilirubin 0.7 0.3 - 1.2 mg/dL   GFR, Estimated >09 >60 mL/min   Anion gap 7 5 - 15  CBC     Status: Abnormal   Collection Time: 09/28/22  8:05 PM  Result Value Ref Range   WBC 6.5 4.0 - 10.5 K/uL   RBC 3.99 (L) 4.22 - 5.81 MIL/uL   Hemoglobin 13.5 13.0 - 17.0 g/dL   HCT 45.4 09.8 - 11.9 %   MCV 99.7 80.0 - 100.0 fL   MCH 33.8 26.0 - 34.0 pg   MCHC 33.9 30.0 - 36.0 g/dL   RDW 14.7 82.9 - 56.2 %   Platelets 255 150 - 400 K/uL   nRBC 0.0 0.0 - 0.2 %   No results found for this or any previous visit (from the past 240 hour(s)).  Renal Function: Recent Labs    09/28/22 2005  CREATININE 1.02   Estimated Creatinine Clearance: 110 mL/min (by C-G formula based on SCr of 1.02 mg/dL).  Radiologic Imaging: No results found.  I independently reviewed the above imaging studies.  Impression/Recommendation 42 year old male with renal colic following ureteroscopy on 9/20 and subsequent stent removal on 9/23.  -Renal US pending -[facility] admitting for pain control.  Continue supportive care -Will alert Dr. Berneice Heinrich of his admission   Rhoderick Moody, MD [facility] 09/29/2022, 4:13 PM

## 2022-09-29 NOTE — ED Provider Notes (Signed)
Patient was instructed to remove stents as these may have been contributing to pain. Awaiting Urology eval by Dr. Thea Silversmith, possible admit for pain control Physical Exam  BP (!) 134/103   Pulse 69   Temp 97.9 F (36.6 C) (Oral)   Resp 18   Ht 5\' 10"  (1.778 m)   Wt 96.6 kg   SpO2 94%   BMI 30.56 kg/m   Physical Exam Vitals and nursing note reviewed.  Constitutional:      Appearance: Normal appearance.     Comments: Appears to be in some pain with some nausea  HENT:     Head: Atraumatic.  Pulmonary:     Effort: Pulmonary effort is normal.  Neurological:     General: No focal deficit present.     Mental Status: He is alert.  Psychiatric:        Mood and Affect: Mood normal.        Behavior: Behavior normal.     Procedures  Procedures  ED Course / MDM    Medical Decision Making Amount and/or Complexity of Data Reviewed Labs: ordered.  Risk Prescription drug management. Decision regarding hospitalization.   Upon initial evaluation of the patient, patient states he is in lots of pain and requests pain medication.  Toradol was given 20 minutes ago without adequate pain control.  Added on 50 mcg fentanyl.   9am: Upon re-evaluation patient still in significant pain. Patient given 1mg  dilaudid for pain and 50mg  benadryl for itching, 5mg  compazine for nausea  11:50am Re consulted Urology for further recommendations.   Upon talking to Dr. Liliane Shi with urology, he recommended obtaining CT for further evaluation.  Patient declined this intervention as he reports Dr. Berneice Heinrich his urologist does not want him getting further CTs given the frequency of his symptoms to limit radiation exposure.  I also spoke to Dr. Ronne Binning via secure chat and he recommended 30 mg Toradol for pain control.  I added an additional 15mg  toradol to the 15mg  already given.  I reconsulted with Dr. Liliane Shi who recommended admission to the hospitalist service for pain control. He is currently unable to evaluate  patient given other emergent urologic cases.     Consultations Obtained: I requested consultation with urology Dr. Liliane Shi,  and discussed lab and imaging findings as well as pertinent plan - they recommend: obtaining CT and will plan to evaluate patient.  External records from outside source obtained and reviewed including original ER visit notes from yesterday, patient originally evaluated by Fayrene Helper PA-C.  Reviewed urology operative note by Dr. Berneice Heinrich on 09/26/22 for bilateral ureteral stent placement   Co morbidities that complicate the patient evaluation  History of frequent nephrolithiasis     Disposition Care of this patient signed out to oncoming ED provider Barnie Alderman, PA-C with plan to reach out to the hospitalist team for admission for pain control. Disposition and treatment plan pending imaging results and clinical judgment of oncoming ED team.           Arabella Merles, PA-C 09/29/22 1549    Anders Simmonds T, DO 09/30/22 [phone]

## 2022-09-29 NOTE — ED Notes (Signed)
ED TO INPATIENT HANDOFF REPORT  ED Nurse Name and Phone #: Eulis Foster Name/Age/Gender Devin Sanders 42 y.o. male Room/Bed: WA12/WA12  Code Status   Code Status: Full Code  Home/SNF/Other Home Patient oriented to: self, place, time, and situation Is this baseline? Yes   Triage Complete: Triage complete  Chief Complaint Hematuria [R31.9]  Triage Note Pt reporting kidney stone removal with stent placed on Friday. Says he is urinating ok, just bloody. Bilateral lower abdominal pain and vomiting. Last med 1400- percocet.    Allergies Allergies  Allergen Reactions   Morphine Nausea And Vomiting   Other Hives and Swelling    Mangos    Level of Care/Admitting Diagnosis ED Disposition     ED Disposition  Admit   Condition  --   Comment  Hospital Area: [facility] [[id number]]  Level of Care: Telemetry [5]  Admit to tele based on following criteria: Other see comments  Comments: bleeding.  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)   GERD (gastroesophageal reflux disease)    History of kidney stones    Left ureteral calculus    Renal calculus, left    Wears glasses    Past Surgical History:  Procedure Laterality Date   CYSTOSCOPY WITH RETROGRADE PYELOGRAM, URETEROSCOPY AND STENT PLACEMENT Bilateral 06/26/2017   Procedure: CYSTOSCOPY WITH RETROGRADE PYELOGRAM, URETEROSCOPY, STONE  BASKETRY  AND STENT PLACEMENT;  Surgeon: [name], MD;  Location: [facility] [place];  Service: Urology;  Laterality: Bilateral;   CYSTOSCOPY WITH RETROGRADE PYELOGRAM, URETEROSCOPY AND STENT PLACEMENT Right 09/18/2017   Procedure: CYSTOSCOPY WITH RETROGRADE PYELOGRAM, URETEROSCOPY AND STENT PLACEMENT;  Surgeon: Crist Fat, MD;  Location: [facility];  Service: Urology;  Laterality: Right;   CYSTOSCOPY WITH RETROGRADE PYELOGRAM, URETEROSCOPY AND STENT PLACEMENT Bilateral 10/13/2017   Procedure: CYSTOSCOPY WITH RETROGRADE PYELOGRAM, BILATERAL URETEROSCOPY AND BILATERAL STENT PLACEMENT, LASER;  Surgeon: [name], MD;  Location: [facility];  Service: Urology;  Laterality: Bilateral;  90 MINS   CYSTOSCOPY WITH RETROGRADE PYELOGRAM, URETEROSCOPY AND STENT PLACEMENT Bilateral 03/24/2018   Procedure: CYSTOSCOPY WITH RETROGRADE PYELOGRAM, URETEROSCOPY AND STENT PLACEMENT;  Surgeon: [name], MD;  Location: [facility];  Service: Urology;  Laterality: Bilateral;  1 HR   CYSTOSCOPY WITH RETROGRADE PYELOGRAM, URETEROSCOPY AND STENT PLACEMENT Bilateral 07/07/2018   Procedure: CYSTOSCOPY WITH RETROGRADE PYELOGRAM, URETEROSCOPY AND STENT PLACEMENT;  Surgeon: [name], MD;  Location: [facility];  Service: Urology;  Laterality: Bilateral;  75 MINUTES   CYSTOSCOPY WITH RETROGRADE PYELOGRAM, URETEROSCOPY AND STENT PLACEMENT Left 01/12/2019   Procedure: CYSTOSCOPY WITH RETROGRADE PYELOGRAM, URETEROSCOPY AND STENT PLACEMENT;  Surgeon: [name], MD;  Location: [facility];  Service: Urology;  Laterality: Left;  90 MINS   CYSTOSCOPY WITH RETROGRADE PYELOGRAM, URETEROSCOPY AND STENT PLACEMENT Left 01/28/2019   Procedure: CYSTOSCOPY WITH RETROGRADE PYELOGRAM, URETEROSCOPY AND STENT EXCHANGE STONE BASKET EXTRACTION ;  Surgeon: [name], MD;  Location: [facility];  Service: Urology;  Laterality: Left;   CYSTOSCOPY WITH RETROGRADE PYELOGRAM, URETEROSCOPY AND STENT PLACEMENT Bilateral  05/13/2019   Procedure: CYSTOSCOPY WITH BILATERAL  RETROGRADE PYELOGRAM, LEFT URETEROSCOPY WITH HOLMIUM LASER AND STENT PLACEMENT;  Surgeon: [name], MD;  Location[facility];  Service: Urology;  Laterality: Bilateral;   CYSTOSCOPY WITH RETROGRADE PYELOGRAM, URETEROSCOPY AND STENT PLACEMENT Bilateral 11/02/2019   Procedure: CYSTOSCOPY WITH RETROGRADE PYELOGRAM, URETEROSCOPY AND STENT PLACEMENT;  Surgeon: [name], MD;  Location: [facility];  Service: Urology;  Laterality: Bilateral;  90 MINS   CYSTOSCOPY WITH RETROGRADE PYELOGRAM, URETEROSCOPY AND STENT PLACEMENT Bilateral 10/12/2020   Procedure: CYSTOSCOPY WITH RETROGRADE PYELOGRAM, URETEROSCOPY, BASKETING OF STONES AND BILATERAL STENT PLACEMENTS;  Surgeon: [name], MD;  Location[facility];  Service: Urology;  Laterality: Bilateral;  75 MINS   CYSTOSCOPY WITH RETROGRADE PYELOGRAM, URETEROSCOPY AND STENT PLACEMENT Bilateral 02/15/2021   Procedure: CYSTOSCOPY WITH RETROGRADE PYELOGRAM, URETEROSCOPY AND STENT PLACEMENT;  Surgeon: [name], MD;  Location[facility];  Service: Urology;  Laterality: Bilateral;  75 MINS   CYSTOSCOPY WITH RETROGRADE PYELOGRAM, URETEROSCOPY AND STENT PLACEMENT Bilateral 09/27/2021   Procedure: CYSTOSCOPY WITH RETROGRADE PYELOGRAM, URETEROSCOPY AND STENT PLACEMENT, VASECTOMY;  Surgeon: [name], MD;  Location[facility];  Service: Urology;  Laterality: Bilateral;  75 MINS   CYSTOSCOPY WITH RETROGRADE PYELOGRAM, URETEROSCOPY AND STENT PLACEMENT Bilateral 02/12/2022   Procedure: CYSTOSCOPY WITH RETROGRADE PYELOGRAM, URETEROSCOPY AND STENT PLACEMENT;  Surgeon: [name], MD;  [facility];  Service: Urology;  Laterality: Bilateral;  75 MINS   CYSTOSCOPY/RETROGRADE/URETEROSCOPY/STONE EXTRACTION WITH BASKET Left 10/05/2017   Procedure: CYSTOSCOPY/RETROGRADE/STONE REMOVAL FROM BLADDER ;  Surgeon: Bjorn Pippin, MD;  Location[facility];  Service: Urology;  Laterality: Left;    CYSTOSCOPY/URETEROSCOPY/HOLMIUM LASER/STENT PLACEMENT Right 09/28/2017   Procedure: CYSTOSCOPY/RIGHT RETROGRADE URETEROSCOPY/HOLMIUM LASER/ RIGHT STENT PLACEMENT;  Surgeon: Rene Paci, MD;  Location[facility];  Service: Urology;  Laterality: Right;   CYSTOSCOPY/URETEROSCOPY/HOLMIUM LASER/STENT PLACEMENT Bilateral 01/27/2018   Procedure: CYSTOSCOPY/URETEROSCOPY/HOLMIUM LASER/STENT PLACEMENT;  Surgeon: [name], MD;  Location[facility];  Service: Urology;  Laterality: Bilateral;   CYSTOSCOPY/URETEROSCOPY/HOLMIUM LASER/STENT PLACEMENT Right 08/08/2020   Procedure: CYSTOSCOPY/URETEROSCOPY/RETROGRADE/ HOLMIUM LASER/STENT PLACEMENT;  Surgeon: Marcine Matar, MD;  Location[facility];  Service: Urology;  Laterality: Right;   CYSTOSCOPY/URETEROSCOPY/HOLMIUM LASER/STENT PLACEMENT Bilateral 09/26/2022   Procedure: CYSTOSCOPY BILATERAL RETROGRADE URETEROSCOPY/HOLMIUM LASER/STENT PLACEMENT;  Surgeon: Loletta Parish., MD;  [facility];  Service: Urology;  Laterality: Bilateral;   HOLMIUM LASER APPLICATION Bilateral 06/26/2017   Procedure: HOLMIUM LASER APPLICATION;  Surgeon: [name], MD;  Location: [facility];  Service: Urology;  Laterality: Bilateral;   HOLMIUM LASER APPLICATION Right 09/18/2017   Procedure: HOLMIUM LASER APPLICATION;  Surgeon: Crist Fat, MD;  Location[facility];  Service: Urology;  Laterality: Right;   HOLMIUM LASER APPLICATION Bilateral 10/13/2017   Procedure: HOLMIUM LASER APPLICATION;  Surgeon: [name], MD;  Location[facility];  Service: Urology;  Laterality: Bilateral;   HOLMIUM LASER APPLICATION Bilateral 01/27/2018   Procedure: HOLMIUM LASER APPLICATION;  Surgeon: [name], MD;  Location: [facility];  Service: Urology;  Laterality: Bilateral;   HOLMIUM LASER APPLICATION Bilateral 03/24/2018   Procedure: HOLMIUM LASER APPLICATION;  Surgeon: [name], MD;   Location[facility];  Service: Urology;  Laterality: Bilateral;   HOLMIUM LASER APPLICATION Bilateral 07/07/2018   Procedure: HOLMIUM LASER APPLICATION;  Surgeon: [name], MD;  Location[facility];  Service: Urology;  Laterality: Bilateral;   HOLMIUM  LASER APPLICATION Left 01/12/2019   Procedure: HOLMIUM LASER APPLICATION;  Surgeon: [name], MD;  Location[facility];  Service: Urology;  Laterality: Left;   HOLMIUM LASER APPLICATION Bilateral 11/02/2019   Procedure: HOLMIUM LASER APPLICATION;  Surgeon: [name], MD;  Location: [facility];  Service: Urology;  Laterality: Bilateral;   HOLMIUM LASER APPLICATION Bilateral 02/15/2021   Procedure: HOLMIUM LASER APPLICATION;  Surgeon: [name], MD;  Location[facility];  Service: Urology;  Laterality: Bilateral;   HOLMIUM LASER APPLICATION Bilateral 09/27/2021   Procedure: HOLMIUM LASER APPLICATION;  Surgeon: [name], MD;  Location[facility];  Service: Urology;  Laterality: Bilateral;   HOLMIUM LASER APPLICATION Bilateral 02/12/2022   Procedure: HOLMIUM LASER APPLICATION;  Surgeon: [name], MD;  Location[facility];  Service: Urology;  Laterality: Bilateral;   PERCUTANEOUS NEPHROSTOLITHOTOMY  2003;  2009;  06-22-2014; 01-30-2015  @ [facility]   URETEROSCOPIC STONE MANIPULATION UNILATERAL  multiple since 1997--  last one 02-19-2017  @ [facility]     A IV Location/Drains/Wounds Patient Lines/Drains/Airways Status     Active Line/Drains/Airways     Name Placement date Placement time Site Days   Peripheral IV 09/28/22 20 G Anterior;Distal;Left Forearm 09/28/22  2206  Forearm  1   Ureteral Drain/Stent Left ureter 5 Fr. 09/27/21  1503  Left ureter  367   Ureteral Drain/Stent Right ureter 5 Fr. 09/27/21  1503  Right ureter  367   Ureteral Drain/Stent Right ureter 5 Fr. 02/12/22  1752  Right ureter  229   Ureteral Drain/Stent Left ureter 5 Fr. 02/12/22  1753  Left ureter  229   Ureteral Drain/Stent Right ureter 5 Fr. 09/26/22   1541  Right ureter  3   Ureteral Drain/Stent Left ureter 5 Fr. 09/26/22  1541  Left ureter  3            Intake/Output Last 24 hours No intake or output data in the 24 hours ending 09/29/22 1618  Labs/Imaging Results for orders placed or performed during the hospital encounter of 09/28/22 (from the past 48 hour(s))  Urinalysis, Routine w reflex microscopic -Urine, Clean Catch     Status: Abnormal   Collection Time: 09/28/22  8:01 PM  Result Value Ref Range   Color, Urine YELLOW YELLOW   APPearance HAZY (A) CLEAR   Specific Gravity, Urine 1.025 1.005 - 1.030   pH 6.0 5.0 - 8.0   Glucose, UA NEGATIVE NEGATIVE mg/dL   Hgb urine dipstick LARGE (A) NEGATIVE   Bilirubin Urine NEGATIVE NEGATIVE   Ketones, ur NEGATIVE NEGATIVE mg/dL   Protein, ur >=841 (A) NEGATIVE mg/dL   Nitrite NEGATIVE NEGATIVE   Leukocytes,Ua MODERATE (A) NEGATIVE   RBC / HPF >50 0 - 5 RBC/hpf   WBC, UA 21-50 0 - 5 WBC/hpf   Bacteria, UA NONE SEEN NONE SEEN   Squamous Epithelial / HPF 0-5 0 - 5 /HPF    Comment: Performed at [facility], [address] [place], [place] [address]  Lipase, blood     Status: None   Collection Time: 09/28/22  8:05 PM  Result Value Ref Range   Lipase 29 11 - 51 U/L    Comment: Performed at [facility], [address], [place], [place] [address]  Comprehensive metabolic panel     Status: Abnormal   Collection Time: 09/28/22  8:05 PM  Result Value Ref Range   Sodium 137 135 - 145 mmol/L   Potassium 3.8 3.5 - 5.1 mmol/L  Chloride 107 98 - 111 mmol/L   CO2 23 22 - 32 mmol/L   Glucose, Bld 96 70 - 99 mg/dL    Comment: Glucose reference range applies only to samples taken after fasting for at least 8 hours.   BUN 27 (H) 6 - 20 mg/dL   Creatinine, Ser 1.61 0.61 - 1.24 mg/dL   Calcium 8.6 (L) 8.9 - 10.3 mg/dL   Total Protein 6.9 6.5 - 8.1 g/dL   Albumin 4.1 3.5 - 5.0 g/dL   AST 15 15 - 41 U/L   ALT 15 0 - 44 U/L   Alkaline Phosphatase 55  38 - 126 U/L   Total Bilirubin 0.7 0.3 - 1.2 mg/dL   GFR, Estimated >09 >60 mL/min    Comment: (NOTE) Calculated using the CKD-EPI Creatinine Equation (2021)    Anion gap 7 5 - 15    Comment: Performed at [facility], [address] [place], [place] [address]  CBC     Status: Abnormal   Collection Time: 09/28/22  8:05 PM  Result Value Ref Range   WBC 6.5 4.0 - 10.5 K/uL   RBC 3.99 (L) 4.22 - 5.81 MIL/uL   Hemoglobin 13.5 13.0 - 17.0 g/dL   HCT 81.1 91.4 - 78.2 %   MCV 99.7 80.0 - 100.0 fL   MCH 33.8 26.0 - 34.0 pg   MCHC 33.9 30.0 - 36.0 g/dL   RDW 95.6 21.3 - 08.6 %   Platelets 255 150 - 400 K/uL   nRBC 0.0 0.0 - 0.2 %    Comment: Performed at [facility], [address] [place], [place] [address]   No results found.  Pending Labs Unresulted Labs (From admission, onward)     Start     Ordered   09/30/22 0500  APTT  Tomorrow morning,   R        09/29/22 1604   09/30/22 0500  Protime-INR  Tomorrow morning,   R        09/29/22 1604   09/30/22 0500  Basic metabolic panel  Tomorrow morning,   R        09/29/22 1604   09/30/22 0500  CBC  Tomorrow morning,   R        09/29/22 1604   09/29/22 1606  Urinalysis, w/ Reflex to Culture (Infection Suspected) -Urine, Clean Catch  (Urine Labs)  Add-on,   AD       Question:  Specimen Source  Answer:  Urine, Clean Catch   09/29/22 1605   09/29/22 1602  HIV Antibody (routine testing w rflx)  (HIV Antibody (Routine testing w reflex) panel)  Once,   R        09/29/22 1604   09/29/22 1601  Type and screen [facility]  ONCE - STAT,   STAT       Comments: [facility]    09/29/22 1601   09/29/22 1601  CBC with Differential/Platelet  Once,   STAT        09/29/22 1601   09/29/22 1601  Comprehensive metabolic panel  ONCE - STAT,   STAT        09/29/22 1601   09/29/22 1601  Protime-INR  Once,   STAT        09/29/22 1601   09/29/22 1601  APTT  Once,   STAT         09/29/22 1601  Vitals/Pain Today's Vitals   09/29/22 1330 09/29/22 1430 09/29/22 1500 09/29/22 1529  BP: 111/84 120/83 129/84   Pulse: 84 86 (!) 105   Resp: 16 16 16    Temp:      TempSrc:      SpO2: 96% 94% 93%   Weight:      Height:      PainSc:    7     Isolation Precautions No active isolations  Medications Medications  lactated ringers infusion ( Intravenous New Bag/Given 09/29/22 1552)  acetaminophen (TYLENOL) tablet 650 mg (has no administration in time range)    Or  acetaminophen (TYLENOL) suppository 650 mg (has no administration in time range)  polyethylene glycol (MIRALAX / GLYCOLAX) packet 17 g (has no administration in time range)  sodium chloride flush (NS) 0.9 % injection 3 mL (has no administration in time range)  cefTRIAXone (ROCEPHIN) 1 g in sodium chloride 0.9 % 100 mL IVPB (has no administration in time range)  HYDROmorphone (DILAUDID) injection 1 mg (1 mg Intravenous Given 09/28/22 2206)  ondansetron (ZOFRAN) injection 4 mg (4 mg Intravenous Given 09/28/22 2206)  sodium chloride 0.9 % bolus 1,000 mL (1,000 mLs Intravenous Bolus 09/28/22 2210)  HYDROmorphone (DILAUDID) injection 1 mg (1 mg Intravenous Given 09/29/22 0026)  diphenhydrAMINE (BENADRYL) injection 25 mg (25 mg Intravenous Given 09/29/22 0027)  ondansetron (ZOFRAN) injection 4 mg (4 mg Intravenous Given 09/29/22 0332)  HYDROmorphone (DILAUDID) injection 1 mg (1 mg Intravenous Given 09/29/22 0441)  ketorolac (TORADOL) 15 MG/ML injection 15 mg (15 mg Intravenous Given 09/29/22 0645)  fentaNYL (SUBLIMAZE) injection 50 mcg (50 mcg Intravenous Given 09/29/22 0701)  HYDROmorphone (DILAUDID) injection 1 mg (1 mg Intravenous Given 09/29/22 0908)  prochlorperazine (COMPAZINE) injection 5 mg (5 mg Intravenous Given 09/29/22 1000)  diphenhydrAMINE (BENADRYL) injection 50 mg (50 mg Intravenous Given 09/29/22 1000)  ketorolac (TORADOL) 30 MG/ML injection 15 mg (15 mg Intravenous Given 09/29/22 1414)   ketorolac (TORADOL) 30 MG/ML injection 15 mg (15 mg Intravenous Given 09/29/22 1526)  diphenhydrAMINE (BENADRYL) injection 25 mg (25 mg Intravenous Given 09/29/22 1552)    Mobility walks     Focused Assessments Kidney Stones   R Recommendations: See Admitting Provider Note  Report given to:   Additional Notes: .

## 2022-09-29 NOTE — Assessment & Plan Note (Signed)
C.w. tiopronin for ppx.

## 2022-09-29 NOTE — Assessment & Plan Note (Addendum)
Patient had ureteral stenting done about 4 days ago, has been reporting bilateral lower abdominal anterior discomfort since then with associated hematuria, finding of leukocytes in the urine.  Patient has this time has removed bilateral ureteral stenting based on urology input.  At this time urine cultures are pending.  We will treat with ceftriaxone.  Pain has improved thus far.  Patient is tolerating p.o. therefore at this time we will transition patient to oral pain regimen.  Etiology of pain is felt to be urinary tract infection at this time.  On physical exam and other occult exam/pathology is felt to be unlikely.

## 2022-09-29 NOTE — Plan of Care (Signed)

## 2022-09-29 NOTE — Assessment & Plan Note (Addendum)
Check inr ptt, hold any a/c. Send type and screen. Check cbc. Started 4 days ago after urology procedure per patient. Pending US renal. Pending Urology eval. Aptient advised to allow inspection of urine. Fluids.

## 2022-09-30 DIAGNOSIS — R319 Hematuria, unspecified: Secondary | ICD-10-CM | POA: Diagnosis not present

## 2022-09-30 LAB — CBC
HCT: 41.9 % (ref 39.0–52.0)
Hemoglobin: 13.7 g/dL (ref 13.0–17.0)
MCH: 33.8 pg (ref 26.0–34.0)
MCHC: 32.7 g/dL (ref 30.0–36.0)
MCV: 103.5 fL — ABNORMAL HIGH (ref 80.0–100.0)
Platelets: 245 10*3/uL (ref 150–400)
RBC: 4.05 MIL/uL — ABNORMAL LOW (ref 4.22–5.81)
RDW: 11.6 % (ref 11.5–15.5)
WBC: 8.1 10*3/uL (ref 4.0–10.5)
nRBC: 0 % (ref 0.0–0.2)

## 2022-09-30 LAB — BASIC METABOLIC PANEL
Anion gap: 8 (ref 5–15)
BUN: 17 mg/dL (ref 6–20)
CO2: 25 mmol/L (ref 22–32)
Calcium: 8.8 mg/dL — ABNORMAL LOW (ref 8.9–10.3)
Chloride: 103 mmol/L (ref 98–111)
Creatinine, Ser: 0.94 mg/dL (ref 0.61–1.24)
GFR, Estimated: >60 mL/min (ref >60)
Glucose, Bld: 96 mg/dL (ref 70–99)
Potassium: 4 mmol/L (ref 3.5–5.1)
Sodium: 136 mmol/L (ref 135–145)

## 2022-09-30 LAB — APTT: aPTT: 27 seconds (ref 24–36)

## 2022-09-30 LAB — PROTIME-INR
INR: 0.9 (ref 0.8–1.2)
Prothrombin Time: 12.8 seconds (ref 11.4–15.2)

## 2022-09-30 MED ORDER — KETOROLAC TROMETHAMINE 15 MG/ML IJ SOLN
15.0000 mg | Freq: Four times a day (QID) | INTRAMUSCULAR | Status: DC
  Administered 2022-09-30 – 2022-10-01 (×4): 15 mg via INTRAVENOUS
  Filled 2022-09-30 (×4): qty 1

## 2022-09-30 MED ORDER — TAMSULOSIN HCL 0.4 MG PO CAPS
0.4000 mg | ORAL_CAPSULE | Freq: Every day | ORAL | Status: DC
  Administered 2022-09-30 – 2022-10-03 (×4): 0.4 mg via ORAL
  Filled 2022-09-30 (×4): qty 1

## 2022-09-30 MED ORDER — DIAZEPAM 5 MG PO TABS
10.0000 mg | ORAL_TABLET | Freq: Four times a day (QID) | ORAL | Status: DC | PRN
  Administered 2022-09-30 – 2022-10-02 (×2): 10 mg via ORAL
  Filled 2022-09-30 (×2): qty 2

## 2022-09-30 MED ORDER — ACETAMINOPHEN 500 MG PO TABS
1000.0000 mg | ORAL_TABLET | Freq: Three times a day (TID) | ORAL | Status: DC
  Administered 2022-09-30 – 2022-10-03 (×9): 1000 mg via ORAL
  Filled 2022-09-30 (×9): qty 2

## 2022-09-30 NOTE — Plan of Care (Signed)
  Problem: Pain Managment: Goal: General experience of comfort will improve Outcome: Progressing   Problem: Safety: Goal: Ability to remain free from injury will improve Outcome: Progressing   

## 2022-09-30 NOTE — Progress Notes (Signed)
[facility] Progress Note Patient: Devin Sanders DGU:[id number] DOB: Jan 05, 1981 DOA: 09/28/2022  DOS: the patient was seen and examined on 09/30/2022  Brief hospital course: PMH of recurrent nephrolithiasis presented to the hospital with complaints of abdominal pain primarily in the left side. In the setting of right-sided hydronephrosis with left renal collecting system stone.  Assessment and Plan: Recurrent nephrolithiasis. Admitted for pain control. Urology aware. Condition currently awaiting evaluation by Dr. Berneice Heinrich Initiated on pain medication I will add Valium, Flomax as ordered in the past admissions.  Concern for UTI. Hematuria. Urology cleared patient but Currently on IV antibiotic.  Monitor for cultures.  Obesity Class 1 Body mass index is 31.14 kg/m.  Placing the pt at higher risk of poor outcomes.  Subjective: No nausea no vomiting no fever no chills.  No dizziness lightheadedness.  Ongoing left-sided pain severe.  Physical Exam: General: in Mild distress, No Rash Cardiovascular: S1 and S2 Present, No Murmur Respiratory: Good respiratory effort, Bilateral Air entry present. No Crackles, No wheezes Abdomen: Bowel Sound present, left-sided tenderness Extremities: No edema Neuro: Alert and oriented x3, no new focal deficit  Data Reviewed: I have Reviewed nursing notes, Vitals, and Lab results. Since last encounter, pertinent lab results CBC and BMP   . I have ordered test including BMP  .   Disposition: Status is: Inpatient Remains inpatient appropriate because: Pain control and evaluation by urology  SCDs Start: 09/29/22 1602   Family Communication: No one at bedside Level of care: Telemetry switch to MedSurg Vitals:   09/30/22 0034 09/30/22 0521 09/30/22 0758 09/30/22 1158  BP: (!) 121/93 (!) 129/96 (!) 150/105 (!) 153/104  Pulse: 83 90 94 90  Resp: 16 16  17   Temp: 98.1 F (36.7 C) 98 F (36.7 C) 97.9 F (36.6 C) 98.3 F (36.8 C)  TempSrc:  Oral Oral Oral Oral  SpO2: 95% 93% 94% 95%  Weight:      Height:         Author: Lynden Oxford, MD 09/30/2022 6:26 PM  Please look on [email or web address] to find out who is on call.

## 2022-10-01 DIAGNOSIS — N200 Calculus of kidney: Secondary | ICD-10-CM | POA: Diagnosis not present

## 2022-10-01 DIAGNOSIS — R52 Pain, unspecified: Secondary | ICD-10-CM

## 2022-10-01 DIAGNOSIS — N3001 Acute cystitis with hematuria: Secondary | ICD-10-CM | POA: Diagnosis not present

## 2022-10-01 LAB — BASIC METABOLIC PANEL
Anion gap: 6 (ref 5–15)
BUN: 18 mg/dL (ref 6–20)
CO2: 27 mmol/L (ref 22–32)
Calcium: 8.6 mg/dL — ABNORMAL LOW (ref 8.9–10.3)
Chloride: 103 mmol/L (ref 98–111)
Creatinine, Ser: 0.87 mg/dL (ref 0.61–1.24)
GFR, Estimated: >60 mL/min (ref >60)
Glucose, Bld: 115 mg/dL — ABNORMAL HIGH (ref 70–99)
Potassium: 3.8 mmol/L (ref 3.5–5.1)
Sodium: 136 mmol/L (ref 135–145)

## 2022-10-01 LAB — MAGNESIUM: Magnesium: 1.9 mg/dL (ref 1.7–2.4)

## 2022-10-01 MED ORDER — OXYBUTYNIN CHLORIDE 5 MG PO TABS
5.0000 mg | ORAL_TABLET | Freq: Three times a day (TID) | ORAL | Status: DC | PRN
  Administered 2022-10-01: 5 mg via ORAL
  Filled 2022-10-01: qty 1

## 2022-10-01 MED ORDER — SODIUM CHLORIDE 0.9 % IV SOLN
2.0000 g | INTRAVENOUS | Status: DC
  Administered 2022-10-01 – 2022-10-02 (×2): 2 g via INTRAVENOUS
  Filled 2022-10-01 (×2): qty 20

## 2022-10-01 MED ORDER — POLYETHYLENE GLYCOL 3350 17 G PO PACK
17.0000 g | PACK | Freq: Every day | ORAL | Status: DC
  Administered 2022-10-01: 17 g via ORAL
  Filled 2022-10-01 (×4): qty 1

## 2022-10-01 MED ORDER — SENNOSIDES-DOCUSATE SODIUM 8.6-50 MG PO TABS
1.0000 | ORAL_TABLET | Freq: Two times a day (BID) | ORAL | Status: DC
  Administered 2022-10-01 – 2022-10-02 (×2): 1 via ORAL
  Filled 2022-10-01 (×6): qty 1

## 2022-10-01 MED ORDER — KETOROLAC TROMETHAMINE 15 MG/ML IJ SOLN
30.0000 mg | Freq: Four times a day (QID) | INTRAMUSCULAR | Status: DC
  Administered 2022-10-01 – 2022-10-03 (×9): 30 mg via INTRAVENOUS
  Filled 2022-10-01 (×9): qty 2

## 2022-10-01 MED ORDER — OXYCODONE HCL 5 MG PO TABS
5.0000 mg | ORAL_TABLET | ORAL | Status: DC | PRN
  Administered 2022-10-01 – 2022-10-03 (×9): 10 mg via ORAL
  Filled 2022-10-01 (×10): qty 2

## 2022-10-01 MED ORDER — DIPHENHYDRAMINE HCL 25 MG PO CAPS
25.0000 mg | ORAL_CAPSULE | Freq: Four times a day (QID) | ORAL | Status: DC | PRN
  Administered 2022-10-01 – 2022-10-02 (×3): 25 mg via ORAL
  Filled 2022-10-01 (×3): qty 1

## 2022-10-01 MED ORDER — SORBITOL 70 % SOLN
30.0000 mL | Freq: Once | Status: DC
  Filled 2022-10-01: qty 30

## 2022-10-01 NOTE — Plan of Care (Signed)
  Problem: Pain Managment: Goal: General experience of comfort will improve Outcome: Progressing   Problem: Safety: Goal: Ability to remain free from injury will improve Outcome: Progressing   

## 2022-10-01 NOTE — Progress Notes (Signed)
Assumed care of patient, agree with previous of going RN assessment

## 2022-10-01 NOTE — Progress Notes (Signed)
PROGRESS NOTE    Devin Sanders  YHC:[id number] DOB: 02-18-1980 DOA: 09/28/2022 PCP: Pcp, No    Chief Complaint  Patient presents with   Abdominal Pain    Brief Narrative:  PMH of recurrent nephrolithiasis presented to the hospital with complaints of abdominal pain primarily in the left side with hematuria. In the setting of right-sided hydronephrosis with left renal collecting system stone.     Assessment & Plan:   Principal Problem:   Hematuria Active Problems:   Renal stone   UTI (urinary tract infection)   Kidney stones   Uncontrolled pain  #1 recurrent nephrolithiasis/uncontrolled pain -Patient presenting with hematuria, uncontrolled abdominal pain with a history of recurrent nephrolithiasis. -Renal ultrasound done with mild right hydronephrosis, mild left renal pelvis dilatation without frank hydronephrosis, echogenic area within the left renal collecting system could relate to small stone bedside does not correlate with recently demonstrated CT kidney stones. -Patient still with complaints of abdominal pain and states once pain medication wears off abdominal pain returns. -Increase scheduled Toradol to 30 mg every 6 hours. -Change oxycodone to 5 to 10 mg p.o. every 4 hours as needed breakthrough pain.  Continue IV Dilaudid for severe pain. -Place on oxybutynin 5 mg 3 times daily as needed spasms. -Continue Flomax. -Urology consulted and following.  2.  UTI/hematuria -Patient presenting with hematuria, patient with recent stent placement, urinalysis done with moderate leukocytes, nitrite negative, WBC 21-50. -Urine cultures not obtained on admission. -Hematuria improved, patient with clear yellow urine. -Continue IV Rocephin and as patient improves clinically could transition to oral antibiotics to complete a 7 to 10-day course of treatment.  3.  Class I obesity -BMI 31.14 kg/m -Lifestyle modification. -Outpatient follow-up with PCP.   DVT prophylaxis:  SCDs Code Status: Full Family Communication: Updated patient.  No family at bedside. Disposition: Home once pain is controlled, resolution of hematuria and clinical improvement.  Status is: Inpatient Remains inpatient appropriate because: Severity of illness/uncontrolled pain.   Consultants:  Urology: Dr. Liliane Shi 09/29/2022  Procedures:  Renal ultrasound 09/29/2022 Abdominal films 09/29/2022   Antimicrobials:  Anti-infectives (From admission, onward)    Start     Dose/Rate Route Frequency Ordered Stop   10/01/22 1600  cefTRIAXone (ROCEPHIN) 2 g in sodium chloride 0.9 % 100 mL IVPB        2 g 200 mL/hr over 30 Minutes Intravenous Every 24 hours 10/01/22 0811     09/29/22 1615  cefTRIAXone (ROCEPHIN) 1 g in sodium chloride 0.9 % 100 mL IVPB  Status:  Discontinued        1 g 200 mL/hr over 30 Minutes Intravenous Every 24 hours 09/29/22 1606 10/01/22 0811         Subjective: Patient laying in bed.  Patient with complaints of bilateral lower abdominal pain left > right.  Patient states hematuria has improved now with clear yellow urine.  States once pain medication wears off pain returns.  Denies any chest pain or shortness of breath.  Has not had a bowel movement in 3 days.  Objective: Vitals:   09/30/22 1158 09/30/22 1953 10/01/22 0440 10/01/22 1345  BP: (!) 153/104 (!) 146/93 134/85 (!) 148/100  Pulse: 90 82 100 79  Resp: 17 16 16 18   Temp: 98.3 F (36.8 C) 97.6 F (36.4 C) 98 F (36.7 C) 97.6 F (36.4 C)  TempSrc: Oral Oral Oral Oral  SpO2: 95% 95% 95% 99%  Weight:      Height:  Intake/Output Summary (Last 24 hours) at 10/01/2022 1841 Last data filed at 10/01/2022 1641 Gross per 24 hour  Intake 700 ml  Output 4700 ml  Net -4000 ml   Filed Weights   09/28/22 1942 09/29/22 1655  Weight: 96.6 kg 98.4 kg    Examination:  General exam: Appears calm and comfortable  Respiratory system: Clear to auscultation.  No wheezes, no crackles, no rhonchi.  Fair air  movement.  Speaking in full sentences.  Respiratory effort normal. Cardiovascular system: S1 & S2 heard, RRR. No JVD, murmurs, rubs, gallops or clicks. No pedal edema. Gastrointestinal system: Abdomen is nondistended, soft and tender to palpation LLQ > RLQ.  Positive bowel sounds.  No rebound.  No guarding.  Central nervous system: Alert and oriented. No focal neurological deficits. Extremities: Symmetric 5 x 5 power. Skin: No rashes, lesions or ulcers Psychiatry: Judgement and insight appear normal. Mood & affect appropriate.     Data Reviewed: I have personally reviewed following labs and imaging studies  CBC: Recent Labs  Lab 09/28/22 2005 09/29/22 1754 09/30/22 0517  WBC 6.5 7.1 8.1  NEUTROABS  --  5.2  --   HGB 13.5 13.6 13.7  HCT 39.8 40.4 41.9  MCV 99.7 98.8 103.5*  PLT 255 236 245    Basic Metabolic Panel: Recent Labs  Lab 09/28/22 2005 09/29/22 1754 09/30/22 0517 10/01/22 0514  NA 137 136 136 136  K 3.8 3.9 4.0 3.8  CL 107 104 103 103  CO2 23 23 25 27   GLUCOSE 96 90 96 115*  BUN 27* 17 17 18   CREATININE 1.02 0.91 0.94 0.87  CALCIUM 8.6* 8.9 8.8* 8.6*  MG  --   --   --  1.9    GFR: Estimated Creatinine Clearance: 130.2 mL/min (by C-G formula based on SCr of 0.87 mg/dL).  Liver Function Tests: Recent Labs  Lab 09/28/22 2005 09/29/22 1754  AST 15 19  ALT 15 17  ALKPHOS 55 55  BILITOT 0.7 1.0  PROT 6.9 6.5  ALBUMIN 4.1 4.0    CBG: No results for input(s): "GLUCAP" in the last 168 hours.   No results found for this or any previous visit (from the past 240 hour(s)).       Radiology Studies: No results found.      Scheduled Meds:  acetaminophen  1,000 mg Oral Q8H   ketorolac  30 mg Intravenous Q6H   polyethylene glycol  17 g Oral Daily   senna-docusate  1 tablet Oral BID   sodium chloride flush  3 mL Intravenous Q12H   sorbitol  30 mL Oral Once   tamsulosin  0.4 mg Oral Daily   Tiopronin  300 mg Oral TID   Continuous  Infusions:  cefTRIAXone (ROCEPHIN)  IV Stopped (10/01/22 1641)   lactated ringers 125 mL/hr at 10/01/22 1708     LOS: 2 days    Time spent: 40 minutes    Ramiro Harvest, MD [facility]   To contact the attending provider between 7A-7P or the covering provider during after hours 7P-7A, please log into the web site [email or web address] and access using universal [place] password for that web site. If you do not have the password, please call the hospital operator.  10/01/2022, 6:41 PM

## 2022-10-02 DIAGNOSIS — R52 Pain, unspecified: Secondary | ICD-10-CM | POA: Diagnosis not present

## 2022-10-02 DIAGNOSIS — N3001 Acute cystitis with hematuria: Secondary | ICD-10-CM | POA: Diagnosis not present

## 2022-10-02 DIAGNOSIS — N200 Calculus of kidney: Secondary | ICD-10-CM | POA: Diagnosis not present

## 2022-10-02 LAB — BASIC METABOLIC PANEL
Anion gap: 7 (ref 5–15)
BUN: 16 mg/dL (ref 6–20)
CO2: 26 mmol/L (ref 22–32)
Calcium: 8.8 mg/dL — ABNORMAL LOW (ref 8.9–10.3)
Chloride: 103 mmol/L (ref 98–111)
Creatinine, Ser: 0.88 mg/dL (ref 0.61–1.24)
GFR, Estimated: >60 mL/min (ref >60)
Glucose, Bld: 97 mg/dL (ref 70–99)
Potassium: 4.1 mmol/L (ref 3.5–5.1)
Sodium: 136 mmol/L (ref 135–145)

## 2022-10-02 NOTE — Plan of Care (Signed)
  Problem: Health Behavior/Discharge Planning: Goal: Ability to manage health-related needs will improve Outcome: Progressing   Problem: Clinical Measurements: Goal: Ability to maintain clinical measurements within normal limits will improve Outcome: Progressing Goal: Will remain free from infection Outcome: Progressing Goal: Diagnostic test results will improve Outcome: Progressing   

## 2022-10-02 NOTE — Progress Notes (Signed)
PROGRESS NOTE    Devin Sanders  ZOX:[id number] DOB: [birth year] DOA: 09/28/2022 PCP: Pcp, No    Chief Complaint  Patient presents with   Abdominal Pain    Brief Narrative:  PMH of recurrent nephrolithiasis presented to the hospital with complaints of abdominal pain primarily in the left side with hematuria. In the setting of right-sided hydronephrosis with left renal collecting system stone.     Assessment & Plan:   Principal Problem:   Hematuria Active Problems:   Renal stone   UTI (urinary tract infection)   Kidney stones   Uncontrolled pain  #1 recurrent nephrolithiasis/uncontrolled pain -Patient presenting with hematuria, uncontrolled abdominal pain with a history of recurrent nephrolithiasis. -Renal ultrasound done with mild right hydronephrosis, mild left renal pelvis dilatation without frank hydronephrosis, echogenic area within the left renal collecting system could relate to small stone bedside does not correlate with recently demonstrated CT kidney stones. -Patient still with complaints of abdominal pain and states once pain medication wears off abdominal pain returns and pain medication only lasts approximately 2 hours.. -Continue scheduled Toradol 30 mg every 6 hours, oxycodone 5 to 10 mg every 4 hours as needed breakthrough pain, IV Dilaudid for severe pain.  -Continue oxybutynin 3 times daily as needed spasms, continue Flomax.  -Urology consulted and following.    2.  UTI/hematuria -Patient presented with hematuria, patient with recent stent placement, urinalysis done with moderate leukocytes, nitrite negative, WBC 21-50. -Urine cultures not obtained on admission. -Hematuria improved, patient with clear yellow urine. -Continue IV Rocephin and as patient improves clinically could transition to oral antibiotics to complete a 7 to 10-day course of treatment in the next day.  3.  Class I obesity -BMI 31.14 kg/m -Lifestyle modification. -Outpatient follow-up  with PCP.   DVT prophylaxis: SCDs Code Status: Full Family Communication: Updated patient.  No family at bedside. Disposition: Home once pain is controlled, resolution of hematuria and clinical improvement.  Status is: Inpatient Remains inpatient appropriate because: Severity of illness/uncontrolled pain.   Consultants:  Urology: Dr. Liliane Shi 09/29/2022  Procedures:  Renal ultrasound 09/29/2022 Abdominal films 09/29/2022   Antimicrobials:  Anti-infectives (From admission, onward)    Start     Dose/Rate Route Frequency Ordered Stop   10/01/22 1600  cefTRIAXone (ROCEPHIN) 2 g in sodium chloride 0.9 % 100 mL IVPB        2 g 200 mL/hr over 30 Minutes Intravenous Every 24 hours 10/01/22 0811     09/29/22 1615  cefTRIAXone (ROCEPHIN) 1 g in sodium chloride 0.9 % 100 mL IVPB  Status:  Discontinued        1 g 200 mL/hr over 30 Minutes Intravenous Every 24 hours 09/29/22 1606 10/01/22 0811         Subjective: Patient lying in bed.  Still complains of bilateral lower abdominal pain L > R, states pain medication only lasts about 2 hours and is back in pain.  Had bowel movement.  No chest pain.  No shortness of breath.  Hematuria seems to have cleared up still with clear yellow urine.   Objective: Vitals:   10/01/22 1345 10/01/22 2156 10/02/22 0534 10/02/22 1306  BP: (!) 148/100 139/86 134/89 (!) 135/92  Pulse: 79 87 84 93  Resp: 18 18 16 16   Temp: 97.6 F (36.4 C) 98.7 F (37.1 C) 97.7 F (36.5 C) 97.6 F (36.4 C)  TempSrc: Oral Oral Oral Oral  SpO2: 99% 96% 95% 96%  Weight:      Height:  Intake/Output Summary (Last 24 hours) at 10/02/2022 1318 Last data filed at 10/02/2022 1313 Gross per 24 hour  Intake 1000 ml  Output 5425 ml  Net -4425 ml   Filed Weights   09/28/22 1942 09/29/22 1655  Weight: 96.6 kg 98.4 kg    Examination:  General exam: NAD Respiratory system: CTAB.  No wheezes, no crackles, no rhonchi.  Fair air movement.  Speaking in full sentences.    Cardiovascular system: RRR no murmurs rubs or gallops.  No JVD.  No lower extremity edema.  Gastrointestinal system: Abdomen is soft, nondistended, TTP LLQ > RLQ.  Positive bowel sounds.  No rebound.  No guarding.  Central nervous system: Alert and oriented. No focal neurological deficits. Extremities: Symmetric 5 x 5 power. Skin: No rashes, lesions or ulcers Psychiatry: Judgement and insight appear normal. Mood & affect appropriate.     Data Reviewed: I have personally reviewed following labs and imaging studies  CBC: Recent Labs  Lab 09/28/22 2005 09/29/22 1754 09/30/22 0517  WBC 6.5 7.1 8.1  NEUTROABS  --  5.2  --   HGB 13.5 13.6 13.7  HCT 39.8 40.4 41.9  MCV 99.7 98.8 103.5*  PLT 255 236 245    Basic Metabolic Panel: Recent Labs  Lab 09/28/22 2005 09/29/22 1754 09/30/22 0517 10/01/22 0514 10/02/22 0448  NA 137 136 136 136 136  K 3.8 3.9 4.0 3.8 4.1  CL 107 104 103 103 103  CO2 23 23 25 27 26   GLUCOSE 96 90 96 115* 97  BUN 27* 17 17 18 16   CREATININE 1.02 0.91 0.94 0.87 0.88  CALCIUM 8.6* 8.9 8.8* 8.6* 8.8*  MG  --   --   --  1.9  --     GFR: Estimated Creatinine Clearance: 128.7 mL/min (by C-G formula based on SCr of 0.88 mg/dL).  Liver Function Tests: Recent Labs  Lab 09/28/22 2005 09/29/22 1754  AST 15 19  ALT 15 17  ALKPHOS 55 55  BILITOT 0.7 1.0  PROT 6.9 6.5  ALBUMIN 4.1 4.0    CBG: No results for input(s): "GLUCAP" in the last 168 hours.   No results found for this or any previous visit (from the past 240 hour(s)).       Radiology Studies: No results found.      Scheduled Meds:  acetaminophen  1,000 mg Oral Q8H   ketorolac  30 mg Intravenous Q6H   polyethylene glycol  17 g Oral Daily   senna-docusate  1 tablet Oral BID   sodium chloride flush  3 mL Intravenous Q12H   sorbitol  30 mL Oral Once   tamsulosin  0.4 mg Oral Daily   Tiopronin  300 mg Oral TID   Continuous Infusions:  cefTRIAXone (ROCEPHIN)  IV Stopped  (10/01/22 1641)   lactated ringers 125 mL/hr at 10/02/22 0928     LOS: 3 days    Time spent: 40 minutes    Ramiro Harvest, MD [facility]   To contact the attending provider between 7A-7P or the covering provider during after hours 7P-7A, please log into the web site [email or web address] and access using universal [place] password for that web site. If you do not have the password, please call the hospital operator.  10/02/2022, 1:18 PM

## 2022-10-02 NOTE — TOC Initial Note (Signed)
Transition of Care [facility]) - Initial/Assessment Note    Patient Details  Name: Devin Sanders MRN: [id number] Date of Birth: [birth year]  [facility] CM/SW Contact:    Lanier Clam, RN Phone Number: 10/02/2022, 12:52 PM  Clinical Narrative:d/c plan home. Provided w/PCP that patient will use @ d/c(to make own appt).                   Expected Discharge Plan: Home/Self Care Barriers to Discharge: Continued Medical Work up   Patient Goals and CMS Choice Patient states their goals for this hospitalization and ongoing recovery are:: Case manager CMS Medicare.gov Compare Post Acute Care list provided to:: Patient Choice offered to / list presented to : Patient [place] ownership interest in [facility].provided to:: Patient    Expected Discharge Plan and Services   Discharge Planning Services: CM Consult Post Acute Care Choice: Resumption of Svcs/PTA Provider Living arrangements for the past 2 months: Single Family Home                                      Prior Living Arrangements/Services Living arrangements for the past 2 months: Single Family Home Lives with:: Spouse Patient language and need for interpreter reviewed:: Yes Do you feel safe going back to the place where you live?: Yes      Need for Family Participation in Patient Care: Yes (Comment) Care giver support system in place?: Yes (comment)   Criminal Activity/Legal Involvement Pertinent to Current Situation/Hospitalization: No - Comment as needed  Activities of Daily Living Home Assistive Devices/Equipment: None ADL Screening (condition at time of admission) Does the patient have a NEW difficulty with bathing/dressing/toileting/self-feeding that is expected to last >3 days?: No Does the patient have a NEW difficulty with getting in/out of bed, walking, or climbing stairs that is expected to last >3 days?: No Does the patient have a NEW difficulty with communication that is  expected to last >3 days?: No Is the patient deaf or have difficulty hearing?: No Does the patient have difficulty seeing, even when wearing glasses/contacts?: No Does the patient have difficulty concentrating, remembering, or making decisions?: No  Permission Sought/Granted Permission sought to share information with : Case Manager Permission granted to share information with : Yes, Verbal Permission Granted  Share Information with NAME: Case manager           Emotional Assessment Appearance:: Appears stated age Attitude/Demeanor/Rapport: Gracious Affect (typically observed): Accepting Orientation: : Oriented to Self, Oriented to Place, Oriented to  Time, Oriented to Situation Alcohol / Substance Use: Not Applicable Psych Involvement: No (comment)  Admission diagnosis:  Kidney stones [N20.0] Uncontrolled pain [R52] Hematuria [R31.9] Patient Active Problem List   Diagnosis Date Noted   Kidney stones 10/01/2022   Uncontrolled pain 10/01/2022   Hematuria 09/29/2022   UTI (urinary tract infection) 09/29/2022   Hydronephrosis 02/15/2022   Renal colic on right side 02/15/2022   Flank pain 10/03/2021   Ureteral colic 09/30/2021   Pain 02/18/2021   Renal stone 02/15/2021   Acute urinary retention 10/15/2020   Ureteral stone with hydronephrosis 08/08/2020   Hydronephrosis of right kidney 09/28/2017   PCP:  Pcp, No Pharmacy:   [id number] - [place], [place] - [address] [place] [address] Phone: [phone] Fax: [phone]     Social Determinants of Health (SDOH) Social History: SDOH Screenings  Food Insecurity: No Food Insecurity (09/29/2022)  Housing: Low Risk  (09/29/2022)  Transportation Needs: No Transportation Needs (09/29/2022)  Utilities: Not At Risk (09/29/2022)  Tobacco Use: Low Risk  (09/28/2022)   SDOH Interventions:     Readmission Risk Interventions     No data to display

## 2022-10-03 ENCOUNTER — Other Ambulatory Visit (HOSPITAL_COMMUNITY): Payer: Self-pay

## 2022-10-03 ENCOUNTER — Other Ambulatory Visit: Payer: Self-pay

## 2022-10-03 DIAGNOSIS — R52 Pain, unspecified: Secondary | ICD-10-CM | POA: Diagnosis not present

## 2022-10-03 DIAGNOSIS — N3001 Acute cystitis with hematuria: Secondary | ICD-10-CM | POA: Diagnosis not present

## 2022-10-03 DIAGNOSIS — N200 Calculus of kidney: Secondary | ICD-10-CM | POA: Diagnosis not present

## 2022-10-03 LAB — BASIC METABOLIC PANEL
Anion gap: 5 (ref 5–15)
BUN: 17 mg/dL (ref 6–20)
CO2: 28 mmol/L (ref 22–32)
Calcium: 8.7 mg/dL — ABNORMAL LOW (ref 8.9–10.3)
Chloride: 101 mmol/L (ref 98–111)
Creatinine, Ser: 0.92 mg/dL (ref 0.61–1.24)
GFR, Estimated: >60 mL/min (ref >60)
Glucose, Bld: 98 mg/dL (ref 70–99)
Potassium: 4.3 mmol/L (ref 3.5–5.1)
Sodium: 134 mmol/L — ABNORMAL LOW (ref 135–145)

## 2022-10-03 LAB — CBC
HCT: 38.6 % — ABNORMAL LOW (ref 39.0–52.0)
Hemoglobin: 13.1 g/dL (ref 13.0–17.0)
MCH: 33.8 pg (ref 26.0–34.0)
MCHC: 33.9 g/dL (ref 30.0–36.0)
MCV: 99.5 fL (ref 80.0–100.0)
Platelets: 222 10*3/uL (ref 150–400)
RBC: 3.88 MIL/uL — ABNORMAL LOW (ref 4.22–5.81)
RDW: 11.4 % — ABNORMAL LOW (ref 11.5–15.5)
WBC: 4.5 10*3/uL (ref 4.0–10.5)
nRBC: 0 % (ref 0.0–0.2)

## 2022-10-03 MED ORDER — TAMSULOSIN HCL 0.4 MG PO CAPS
0.4000 mg | ORAL_CAPSULE | Freq: Every day | ORAL | 1 refills | Status: DC
  Filled 2022-10-03: qty 30, 30d supply, fill #0

## 2022-10-03 MED ORDER — CEFADROXIL 500 MG PO CAPS
1000.0000 mg | ORAL_CAPSULE | Freq: Two times a day (BID) | ORAL | Status: DC
  Administered 2022-10-03: 1000 mg via ORAL
  Filled 2022-10-03: qty 2

## 2022-10-03 MED ORDER — SENNOSIDES-DOCUSATE SODIUM 8.6-50 MG PO TABS
1.0000 | ORAL_TABLET | Freq: Two times a day (BID) | ORAL | 0 refills | Status: DC
  Filled 2022-10-03: qty 30, 15d supply, fill #0

## 2022-10-03 MED ORDER — POLYETHYLENE GLYCOL 3350 17 G PO PACK
17.0000 g | PACK | Freq: Every day | ORAL | 0 refills | Status: DC | PRN
  Filled 2022-10-03: qty 30, 30d supply, fill #0

## 2022-10-03 MED ORDER — OXYCODONE-ACETAMINOPHEN 5-325 MG PO TABS
1.0000 | ORAL_TABLET | Freq: Four times a day (QID) | ORAL | 0 refills | Status: DC | PRN
Start: 2022-10-03 — End: 2023-04-04
  Filled 2022-10-03: qty 15, 4d supply, fill #0

## 2022-10-03 MED ORDER — CEFADROXIL 500 MG PO CAPS
1000.0000 mg | ORAL_CAPSULE | Freq: Two times a day (BID) | ORAL | 0 refills | Status: AC
  Filled 2022-10-03: qty 28, 7d supply, fill #0

## 2022-10-03 MED ORDER — ACETAMINOPHEN 500 MG PO TABS: 500.0000 mg | ORAL_TABLET | Freq: Three times a day (TID) | ORAL | Status: AC

## 2022-10-03 MED ORDER — OXYBUTYNIN CHLORIDE 5 MG PO TABS
5.0000 mg | ORAL_TABLET | Freq: Three times a day (TID) | ORAL | 0 refills | Status: DC | PRN
  Filled 2022-10-03: qty 30, 10d supply, fill #0

## 2022-10-03 NOTE — Progress Notes (Incomplete)
PROGRESS NOTE    Devin Sanders  ION:[id number] DOB: 30-Sep-1980 DOA: 09/28/2022 PCP: Pcp, No    Chief Complaint  Patient presents with   Abdominal Pain    Brief Narrative:  PMH of recurrent nephrolithiasis presented to the hospital with complaints of abdominal pain primarily in the left side with hematuria. In the setting of right-sided hydronephrosis with left renal collecting system stone.     Assessment & Plan:   Principal Problem:   Hematuria Active Problems:   Renal stone   UTI (urinary tract infection)   Kidney stones   Uncontrolled pain  #1 recurrent nephrolithiasis/uncontrolled pain -Patient presenting with hematuria, uncontrolled abdominal pain with a history of recurrent nephrolithiasis. -Renal ultrasound done with mild right hydronephrosis, mild left renal pelvis dilatation without frank hydronephrosis, echogenic area within the left renal collecting system could relate to small stone bedside does not correlate with recently demonstrated CT kidney stones. -Patient still with complaints of abdominal pain and states once pain medication wears off abdominal pain returns and pain medication only lasts approximately 2 hours.. -Continue scheduled Toradol 30 mg every 6 hours, oxycodone 5 to 10 mg every 4 hours as needed breakthrough pain, IV Dilaudid for severe pain.  -Continue oxybutynin 3 times daily as needed spasms, continue Flomax.  -Urology consulted and following.    2.  UTI/hematuria -Patient presented with hematuria, patient with recent stent placement, urinalysis done with moderate leukocytes, nitrite negative, WBC 21-50. -Urine cultures not obtained on admission. -Hematuria improved, patient with clear yellow urine. -Continue IV Rocephin and as patient improves clinically could transition to oral antibiotics to complete a 7 to 10-day course of treatment in the next day.  -Transition from IV Rocephin to Duricef to complete a 7 to 10-day course of antibiotic  treatment.  3.  Class I obesity -BMI 31.14 kg/m -Lifestyle modification. -Outpatient follow-up with PCP.   DVT prophylaxis: SCDs Code Status: Full Family Communication: Updated patient.  No family at bedside. Disposition: Home once pain is controlled, resolution of hematuria and clinical improvement.  Status is: Inpatient Remains inpatient appropriate because: Severity of illness/uncontrolled pain.   Consultants:  Urology: Dr. Liliane Shi 09/29/2022  Procedures:  Renal ultrasound 09/29/2022 Abdominal films 09/29/2022   Antimicrobials:  Anti-infectives (From admission, onward)    Start     Dose/Rate Route Frequency Ordered Stop   10/03/22 1000  cefadroxil (DURICEF) capsule 1,000 mg        1,000 mg Oral 2 times daily 10/03/22 0752 10/10/22 0959   10/01/22 1600  cefTRIAXone (ROCEPHIN) 2 g in sodium chloride 0.9 % 100 mL IVPB  Status:  Discontinued        2 g 200 mL/hr over 30 Minutes Intravenous Every 24 hours 10/01/22 0811 10/03/22 0752   09/29/22 1615  cefTRIAXone (ROCEPHIN) 1 g in sodium chloride 0.9 % 100 mL IVPB  Status:  Discontinued        1 g 200 mL/hr over 30 Minutes Intravenous Every 24 hours 09/29/22 1606 10/01/22 0811         Subjective: Patient lying in bed.  Still complains of bilateral lower abdominal pain L > R, states pain medication only lasts about 2 hours and is back in pain.  Had bowel movement.  No chest pain.  No shortness of breath.  Hematuria seems to have cleared up still with clear yellow urine.   Objective: Vitals:   10/02/22 0534 10/02/22 1306 10/02/22 2045 10/03/22 0525  BP: 134/89 (!) 135/92 (!) 158/99 135/88  Pulse: 84 93  76 79  Resp: 16 16 12 14   Temp: 97.7 F (36.5 C) 97.6 F (36.4 C) 97.6 F (36.4 C) (!) 97.5 F (36.4 C)  TempSrc: Oral Oral Oral Oral  SpO2: 95% 96% 95% 92%  Weight:      Height:        Intake/Output Summary (Last 24 hours) at 10/03/2022 0801 Last data filed at 10/03/2022 0600 Gross per 24 hour  Intake 7475.15 ml   Output 5000 ml  Net 2475.15 ml   Filed Weights   09/28/22 1942 09/29/22 1655  Weight: 96.6 kg 98.4 kg    Examination:  General exam: NAD Respiratory system: CTAB.  No wheezes, no crackles, no rhonchi.  Fair air movement.  Speaking in full sentences.   Cardiovascular system: RRR no murmurs rubs or gallops.  No JVD.  No lower extremity edema.  Gastrointestinal system: Abdomen is soft, nondistended, TTP LLQ > RLQ.  Positive bowel sounds.  No rebound.  No guarding.  Central nervous system: Alert and oriented. No focal neurological deficits. Extremities: Symmetric 5 x 5 power. Skin: No rashes, lesions or ulcers Psychiatry: Judgement and insight appear normal. Mood & affect appropriate.     Data Reviewed: I have personally reviewed following labs and imaging studies  CBC: Recent Labs  Lab 09/28/22 2005 09/29/22 1754 09/30/22 0517 10/03/22 0510  WBC 6.5 7.1 8.1 4.5  NEUTROABS  --  5.2  --   --   HGB 13.5 13.6 13.7 13.1  HCT 39.8 40.4 41.9 38.6*  MCV 99.7 98.8 103.5* 99.5  PLT 255 236 245 222    Basic Metabolic Panel: Recent Labs  Lab 09/29/22 1754 09/30/22 0517 10/01/22 0514 10/02/22 0448 10/03/22 0510  NA 136 136 136 136 134*  K 3.9 4.0 3.8 4.1 4.3  CL 104 103 103 103 101  CO2 23 25 27 26 28   GLUCOSE 90 96 115* 97 98  BUN 17 17 18 16 17   CREATININE 0.91 0.94 0.87 0.88 0.92  CALCIUM 8.9 8.8* 8.6* 8.8* 8.7*  MG  --   --  1.9  --   --     GFR: Estimated Creatinine Clearance: 123.1 mL/min (by C-G formula based on SCr of 0.92 mg/dL).  Liver Function Tests: Recent Labs  Lab 09/28/22 2005 09/29/22 1754  AST 15 19  ALT 15 17  ALKPHOS 55 55  BILITOT 0.7 1.0  PROT 6.9 6.5  ALBUMIN 4.1 4.0    CBG: No results for input(s): "GLUCAP" in the last 168 hours.   No results found for this or any previous visit (from the past 240 hour(s)).       Radiology Studies: No results found.      Scheduled Meds:  acetaminophen  1,000 mg Oral Q8H   cefadroxil   1,000 mg Oral BID   ketorolac  30 mg Intravenous Q6H   polyethylene glycol  17 g Oral Daily   senna-docusate  1 tablet Oral BID   sodium chloride flush  3 mL Intravenous Q12H   sorbitol  30 mL Oral Once   tamsulosin  0.4 mg Oral Daily   Tiopronin  300 mg Oral TID   Continuous Infusions:     LOS: 4 days    Time spent: 40 minutes    Ramiro Harvest, MD [facility]   To contact the attending provider between 7A-7P or the covering provider during after hours 7P-7A, please log into the web site [email or web address] and access using universal  password for that web site.  If you do not have the password, please call the hospital operator.  10/03/2022, 8:01 AM

## 2022-10-03 NOTE — Progress Notes (Signed)
   Subjective/Chief Complaint:   1 - Pain After Ureteroscopy  / Cysteinuria - s/p inuumerable endoscopic treatments for stones most recetnly 09/26/22 with bilateral ureteroscopy. Sig falnk pain after uncomplicated stent removal at home. Renal US without significatn stones or impressive hydro in ER here.  Today "Kathlene November" is feeling improved. Wants to get home. Afebrile. Cr and WBC normal.   Objective: Vital signs in last 24 hours: Temp:  [97.5 F (36.4 C)-97.6 F (36.4 C)] 97.5 F (36.4 C) (09/27 0525) Pulse Rate:  [76-93] 79 (09/27 0525) Resp:  [12-16] 14 (09/27 0525) BP: (135-158)/(88-99) 135/88 (09/27 0525) SpO2:  [92 %-96 %] 92 % (09/27 0525) Last BM Date : 09/30/22  Intake/Output from previous day: 09/26 0701 - 09/27 0700 In: 7475.2 [P.O.:240; I.V.:7235.2] Out: 5000 [Urine:5000] Intake/Output this shift: Total I/O In: -  Out: 500 [Urine:500]  NAD, at baseline Non-labored breathing on RA RRR SNTND, very mild left CVAT No foley  Lab Results:  Recent Labs    10/03/22 0510  WBC 4.5  HGB 13.1  HCT 38.6*  PLT 222   BMET Recent Labs    10/02/22 0448 10/03/22 0510  NA 136 134*  K 4.1 4.3  CL 103 101  CO2 26 28  GLUCOSE 97 98  BUN 16 17  CREATININE 0.88 0.92  CALCIUM 8.8* 8.7*   PT/INR No results for input(s): "LABPROT", "INR" in the last 72 hours. ABG No results for input(s): "PHART", "HCO3" in the last 72 hours.  Invalid input(s): "PCO2", "PO2"  Studies/Results: No results found.  Anti-infectives: Anti-infectives (From admission, onward)    Start     Dose/Rate Route Frequency Ordered Stop   10/03/22 1000  cefadroxil (DURICEF) capsule 1,000 mg        1,000 mg Oral 2 times daily 10/03/22 0752 10/10/22 0959   10/01/22 1600  cefTRIAXone (ROCEPHIN) 2 g in sodium chloride 0.9 % 100 mL IVPB  Status:  Discontinued        2 g 200 mL/hr over 30 Minutes Intravenous Every 24 hours 10/01/22 0811 10/03/22 0752   09/29/22 1615  cefTRIAXone (ROCEPHIN) 1 g in  sodium chloride 0.9 % 100 mL IVPB  Status:  Discontinued        1 g 200 mL/hr over 30 Minutes Intravenous Every 24 hours 09/29/22 1606 10/01/22 0811       Assessment/Plan:  I feel pt OK for DC today as his pain is now managable and no worriseom factors on labs / imaging. We discussed at length strategies to help minimize readmissions as he appear to be in a cycle of impressive hyperalgesia. We may try leaving peri-op stents 7-10 days instead of the typical 2-3 in future as he feels pain really starts when they removed (despite objective criteria of significant obstruction.  Appreicate hospitalist team comanagment.    Loletta Parish. 10/03/2022

## 2022-10-03 NOTE — Discharge Summary (Signed)
Physician Discharge Summary  Devin Sanders JXB:[id number] DOB: 11-14-1980 DOA: 09/28/2022  PCP: Pcp, No  Admit date: 09/28/2022 Discharge date: 10/03/2022  Time spent: 60 minutes  Recommendations for Outpatient Follow-up:  Follow-up with Dr. Berneice Heinrich, urology in 2 weeks. Follow-up with [facility] in 2 weeks.  Patient may need further pain management on follow-up or may need referral to pain clinic.  Patient will need a basic metabolic profile done as well as a CBC done to follow-up on electrolytes renal function and counts.   Discharge Diagnoses:  Principal Problem:   Hematuria Active Problems:   Renal stone   UTI (urinary tract infection)   Kidney stones   Uncontrolled pain   Discharge Condition: Stable and improved  Diet recommendation: Regular  Filed Weights   09/28/22 1942 09/29/22 1655  Weight: 96.6 kg 98.4 kg    History of present illness:  HPI per Dr.Goel Devin Sanders is a 42 y.o. male with medical history significant of chronic renal calculi.  Patient actually underwent bilateral nephroscopy as well as lithotripsy and stent placement approximately 4 days ago by urology.  Patient reports since then that he has had persistent hematuria and bilateral lower abdominal anterior discomfort/achiness.  There is no dysuria no fever no nausea vomiting or diarrhea.  Pain has no aggravating or relieving factors.  Pain got worse today prompting the patient to come to the ER.   Patient had approximately 16-hour ER stay prior to being accepted by hospitalist service due to persistent pain.  Patient has received pain medications IV fluids.  Patient reports persistent hematuria.  However at this time no urine is available for inspection.  Patient reports good response to pain medication and discomfort currently comfortable.  Patient declined CAT scan for evaluation of pain.  Patient earlier removed bilateral ureteral stents in the ER  Hospital Course:  #1  recurrent nephrolithiasis/uncontrolled pain -Patient presented with hematuria, uncontrolled abdominal pain with a history of recurrent nephrolithiasis. -Renal ultrasound done with mild right hydronephrosis, mild left renal pelvis dilatation without frank hydronephrosis, echogenic area within the left renal collecting system could relate to small stone bedside does not correlate with recently demonstrated CT kidney stones. -Patient still with complaints of abdominal pain and states once pain medication wears off abdominal pain returns and pain medication only lasts approximately 2 hours.. -Patient subsequently placed on scheduled Toradol 30 mg every 6 hours, oxycodone 5 to 10 mg every 4 hours as needed breakthrough pain, IV Dilaudid for severe pain.   -Patient also placed on oxybutynin 3 times daily as needed spasms, as well as Flomax.  -Patient seen by urology and followed by urology during the hospitalization who reviewed renal ultrasound films and hospital course and patient cleared for discharge home with outpatient follow-up.   -Urology was recommending next time to try to leave perioperative stents for 7 to 10 days instead of the typical 2 to 3 days as it was felt that patient's pain started when stents were removed.   -Patient's pain was better controlled during the hospitalization and patient be discharged home on few pills of Percocet, scheduled Tylenol with outpatient follow-up with urology.   -Patient will be discharged in stable and improved condition.    2.  UTI/hematuria -Patient presented with hematuria, patient with recent stent placement, urinalysis done with moderate leukocytes, nitrite negative, WBC 21-50. -Urine cultures not obtained on admission. -Hematuria improved and had resolved by day of discharge, patient with clear yellow urine. -Patient maintained on IV Rocephin  and patient will be transitioned to [facility] on discharge for 7 more days to complete a 10-day course of  antibiotic treatment.   -   3.  Class I obesity -BMI 31.14 kg/m -Lifestyle modification. -Outpatient follow-up with PCP.  Procedures: Renal ultrasound 09/29/2022 Abdominal films 09/29/2022  Consultations: Urology: Dr. Liliane Shi 09/29/2022   Discharge Exam: Vitals:   10/03/22 0525 10/03/22 1258  BP: 135/88 (!) 152/91  Pulse: 79 94  Resp: 14 18  Temp: (!) 97.5 F (36.4 C) 98.3 F (36.8 C)  SpO2: 92% 97%    General: NAD Cardiovascular: RRR no murmurs rubs or gallops.  No JVD.  No lower extremity edema. Respiratory: Clear to auscultation bilaterally.  No wheezes, no crackles, no rhonchi.  Fair air movement.  Speaking in full sentences.  Discharge Instructions   Discharge Instructions     Diet general   Complete by: As directed    Increase activity slowly   Complete by: As directed       Allergies as of 10/03/2022       Reactions   Morphine Nausea And Vomiting   Other Hives, Swelling   Mangos        Medication List     STOP taking these medications    cephALEXin 500 MG capsule Commonly known as: KEFLEX       TAKE these medications    acetaminophen 500 MG tablet Commonly known as: TYLENOL Take 1 tablet (500 mg total) by mouth every 8 (eight) hours for 7 days.   cefadroxil 500 MG capsule Commonly known as: DURICEF Take 2 capsules (1,000 mg total) by mouth 2 (two) times daily for 7 days.   ketorolac 10 MG tablet Commonly known as: TORADOL Take 1 tablet (10 mg total) by mouth every 8 (eight) hours as needed for moderate pain (or stent discomfort post-operatively).   ondansetron 4 MG disintegrating tablet Commonly known as: ZOFRAN-ODT Take 1 tablet (4 mg total) by mouth every 8 (eight) hours as needed for nausea or vomiting. What changed: reasons to take this   oxybutynin 5 MG tablet Commonly known as: DITROPAN Take 1 tablet (5 mg total) by mouth every 8 (eight) hours as needed for bladder spasms.   oxyCODONE-acetaminophen 5-325 MG  tablet Commonly known as: Percocet Take 1 tablet by mouth every 6 (six) hours as needed for severe pain (post-operatively).   polyethylene glycol 17 g packet Commonly known as: MIRALAX / GLYCOLAX Take 17 g by mouth daily as needed for mild constipation.   senna-docusate 8.6-50 MG tablet Commonly known as: Senokot-S Take 1 tablet by mouth 2 (two) times daily.   tamsulosin 0.4 MG Caps capsule Commonly known as: FLOMAX Take 1 capsule (0.4 mg total) by mouth daily. Start taking on: October 04, 2022   Thiola EC 300 MG Tbec Generic drug: Tiopronin Take 300 mg by mouth in the morning, at noon, and at bedtime.       Allergies  Allergen Reactions   Morphine Nausea And Vomiting   Other Hives and Swelling    Mangos    Follow-up Information     [facility]. Schedule an appointment as soon as possible for a visit in 2 week(s).   Why: Dr. Lenoria Farrier information: [address] [place] [address] [phone]        Loletta Parish., MD. Schedule an appointment as soon as possible for a visit in 2 week(s).   Specialty: Urology Contact information: [address] [place]  [place] [address] [phone]                  The results of significant diagnostics from this hospitalization (including imaging, microbiology, ancillary and laboratory) are listed below for reference.    Significant Diagnostic Studies: DG Abd 2 Views  Result Date: 09/29/2022 CLINICAL DATA:  Abdominal pain EXAM: ABDOMEN - 2 VIEW COMPARISON:  06/30/2022, 08/31/2022 FINDINGS: The bowel gas pattern is normal. There is no evidence of free air. 3 mm calcification projecting over the left renal shadow. No calcifications are seen projecting over the right renal shadow. IMPRESSION: 1. No acute findings. 2. 3 mm calcification projecting over the left renal shadow, which may represent a renal calculus. Electronically Signed   By: Duanne Guess D.O.   On:  09/29/2022 19:10   US RENAL  Result Date: 09/29/2022 CLINICAL DATA:  Nephrolithiasis, recent cystoscopy with stent EXAM: RENAL / URINARY TRACT ULTRASOUND COMPLETE COMPARISON:  CT 08/31/2022 FINDINGS: Right Kidney: Renal measurements: 15.3 by 7.4 x 7.7 cm = volume: 454.6 mL. Echogenicity within normal limits. Mild hydronephrosis. Small cyst measuring 13 mm, no imaging follow-up is recommended Left Kidney: Renal measurements: 13.4 x 6.1 x 6.2 cm = volume: 262.2 mL. Echogenicity within normal limits. Negative for mass. Mild renal pelvis dilatation without frank hydronephrosis. Intrarenal echogenic focus measuring up to 14 mm. Bladder: Appears normal for degree of bladder distention. Other: None. IMPRESSION: 1. Mild right hydronephrosis. 2. Mild left renal pelvis dilatation without frank hydronephrosis. 3. Echogenic area within the left renal collecting system, could relate to small stone but size does not correlate with recently demonstrated CT kidney stones. Electronically Signed   By: Jasmine Pang M.D.   On: 09/29/2022 18:40    Microbiology: No results found for this or any previous visit (from the past 240 hour(s)).   Labs: Basic Metabolic Panel: Recent Labs  Lab 09/29/22 1754 09/30/22 0517 10/01/22 0514 10/02/22 0448 10/03/22 0510  NA 136 136 136 136 134*  K 3.9 4.0 3.8 4.1 4.3  CL 104 103 103 103 101  CO2 23 25 27 26 28   GLUCOSE 90 96 115* 97 98  BUN 17 17 18 16 17   CREATININE 0.91 0.94 0.87 0.88 0.92  CALCIUM 8.9 8.8* 8.6* 8.8* 8.7*  MG  --   --  1.9  --   --    Liver Function Tests: Recent Labs  Lab 09/28/22 2005 09/29/22 1754  AST 15 19  ALT 15 17  ALKPHOS 55 55  BILITOT 0.7 1.0  PROT 6.9 6.5  ALBUMIN 4.1 4.0   Recent Labs  Lab 09/28/22 2005  LIPASE 29   No results for input(s): "AMMONIA" in the last 168 hours. CBC: Recent Labs  Lab 09/28/22 2005 09/29/22 1754 09/30/22 0517 10/03/22 0510  WBC 6.5 7.1 8.1 4.5  NEUTROABS  --  5.2  --   --   HGB 13.5 13.6  13.7 13.1  HCT 39.8 40.4 41.9 38.6*  MCV 99.7 98.8 103.5* 99.5  PLT 255 236 245 222   Cardiac Enzymes: No results for input(s): "CKTOTAL", "CKMB", "CKMBINDEX", "TROPONINI" in the last 168 hours. BNP: BNP (last 3 results) No results for input(s): "BNP" in the last 8760 hours.  ProBNP (last 3 results) No results for input(s): "PROBNP" in the last 8760 hours.  CBG: No results for input(s): "GLUCAP" in the last 168 hours.     Signed:  Ramiro Harvest MD.  [facility] 10/03/2022, 2:50 PM

## 2022-10-03 NOTE — Plan of Care (Signed)
  Problem: Pain Managment: Goal: General experience of comfort will improve Outcome: Progressing   Problem: Safety: Goal: Ability to remain free from injury will improve Outcome: Progressing   

## 2023-04-04 ENCOUNTER — Emergency Department (HOSPITAL_COMMUNITY)

## 2023-04-04 ENCOUNTER — Emergency Department (HOSPITAL_COMMUNITY)
Admission: EM | Admit: 2023-04-04 | Discharge: 2023-04-04 | Disposition: A | Discharge status: Home / Self Care | Attending: Emergency Medicine | Admitting: Emergency Medicine

## 2023-04-04 DIAGNOSIS — R1030 Lower abdominal pain, unspecified: Secondary | ICD-10-CM

## 2023-04-04 DIAGNOSIS — R1031 Right lower quadrant pain: Secondary | ICD-10-CM | POA: Insufficient documentation

## 2023-04-04 DIAGNOSIS — R1032 Left lower quadrant pain: Secondary | ICD-10-CM | POA: Diagnosis not present

## 2023-04-04 LAB — CBC WITH DIFFERENTIAL/PLATELET
Abs Immature Granulocytes: 0.01 10*3/uL (ref 0.00–0.07)
Basophils Absolute: 0 10*3/uL (ref 0.0–0.1)
Basophils Relative: 0 %
Eosinophils Absolute: 0.1 10*3/uL (ref 0.0–0.5)
Eosinophils Relative: 1 %
HCT: 44.1 % (ref 39.0–52.0)
Hemoglobin: 14.9 g/dL (ref 13.0–17.0)
Immature Granulocytes: 0 %
Lymphocytes Relative: 32 %
Lymphs Abs: 1.7 10*3/uL (ref 0.7–4.0)
MCH: 33.7 pg (ref 26.0–34.0)
MCHC: 33.8 g/dL (ref 30.0–36.0)
MCV: 99.8 fL (ref 80.0–100.0)
Monocytes Absolute: 0.5 10*3/uL (ref 0.1–1.0)
Monocytes Relative: 9 %
Neutro Abs: 2.9 10*3/uL (ref 1.7–7.7)
Neutrophils Relative %: 58 %
Platelets: 270 10*3/uL (ref 150–400)
RBC: 4.42 MIL/uL (ref 4.22–5.81)
RDW: 11.6 % (ref 11.5–15.5)
WBC: 5.1 10*3/uL (ref 4.0–10.5)
nRBC: 0 % (ref 0.0–0.2)

## 2023-04-04 LAB — BASIC METABOLIC PANEL WITH GFR
Anion gap: 12 (ref 5–15)
BUN: 23 mg/dL — ABNORMAL HIGH (ref 6–20)
CO2: 21 mmol/L — ABNORMAL LOW (ref 22–32)
Calcium: 9.4 mg/dL (ref 8.9–10.3)
Chloride: 104 mmol/L (ref 98–111)
Creatinine, Ser: 1 mg/dL (ref 0.61–1.24)
GFR, Estimated: >60 mL/min (ref >60)
Glucose, Bld: 82 mg/dL (ref 70–99)
Potassium: 4.2 mmol/L (ref 3.5–5.1)
Sodium: 137 mmol/L (ref 135–145)

## 2023-04-04 LAB — URINALYSIS, ROUTINE W REFLEX MICROSCOPIC
Bilirubin Urine: NEGATIVE
Glucose, UA: NEGATIVE mg/dL
Hgb urine dipstick: NEGATIVE
Ketones, ur: NEGATIVE mg/dL
Leukocytes,Ua: NEGATIVE
Nitrite: NEGATIVE
Protein, ur: NEGATIVE mg/dL
Specific Gravity, Urine: 1.027 (ref 1.005–1.030)
pH: 6 (ref 5.0–8.0)

## 2023-04-04 MED ORDER — HYDROMORPHONE HCL 1 MG/ML IJ SOLN: 1.0000 mg | Freq: Once | INTRAMUSCULAR | Status: DC

## 2023-04-04 MED ORDER — TAMSULOSIN HCL 0.4 MG PO CAPS: 0.4000 mg | ORAL_CAPSULE | Freq: Every day | ORAL | 1 refills | Status: DC

## 2023-04-04 MED ORDER — ONDANSETRON HCL 4 MG/2ML IJ SOLN: 4.0000 mg | Freq: Once | INTRAMUSCULAR | Status: DC

## 2023-04-04 MED ORDER — OXYCODONE-ACETAMINOPHEN 5-325 MG PO TABS: 1.0000 | ORAL_TABLET | Freq: Four times a day (QID) | ORAL | 0 refills | Status: DC | PRN

## 2023-04-04 MED ORDER — SODIUM CHLORIDE 0.9 % IV BOLUS: 1000.0000 mL | Freq: Once | INTRAVENOUS | Status: DC

## 2023-04-04 MED ORDER — HYDROMORPHONE HCL 1 MG/ML IJ SOLN
1.0000 mg | Freq: Once | INTRAMUSCULAR | Status: AC
  Administered 2023-04-04: 1 mg via INTRAMUSCULAR
  Filled 2023-04-04: qty 1

## 2023-04-04 MED ORDER — KETOROLAC TROMETHAMINE 60 MG/2ML IM SOLN
60.0000 mg | Freq: Once | INTRAMUSCULAR | Status: AC
  Administered 2023-04-04: 60 mg via INTRAMUSCULAR
  Filled 2023-04-04: qty 2

## 2023-04-04 MED ORDER — ONDANSETRON HCL 4 MG PO TABS: 4.0000 mg | ORAL_TABLET | Freq: Three times a day (TID) | ORAL | 0 refills | Status: DC | PRN

## 2023-04-04 MED ORDER — ONDANSETRON 8 MG PO TBDP
8.0000 mg | ORAL_TABLET | Freq: Once | ORAL | Status: AC
  Administered 2023-04-04: 8 mg via ORAL
  Filled 2023-04-04: qty 1

## 2023-04-04 MED ORDER — KETOROLAC TROMETHAMINE 30 MG/ML IJ SOLN: 30.0000 mg | Freq: Once | INTRAMUSCULAR | Status: DC

## 2023-04-04 NOTE — ED Provider Notes (Signed)
 [place] EMERGENCY DEPARTMENT AT [facility] Provider Note   CSN: [id number] Arrival date & time: 04/04/23  1338     History {Add pertinent medical, surgical, social history, OB history to HPI:1} Chief Complaint  Patient presents with   Flank Pain    Devin Sanders is a 43 y.o. male.  He has a history of recurrent kidney stones and follows with Dr. Berneice Heinrich from urology.  Has had worsening flank pain and passed some stone fragments over the last few days.  Trying ibuprofen but is so nauseous from the pain that is not holding it down.  Tried to reach Dr. Berneice Heinrich today without success.  No fever no significant urinary symptoms.  Is nausea and vomiting but thinks it is related to pain.  Pain is mostly in his right groin but also left groin and right and left flank.  No hematuria  The history is provided by the patient.  Flank Pain This is a recurrent problem. The current episode started more than 2 days ago. The problem occurs constantly. The problem has not changed since onset.Associated symptoms include abdominal pain. Pertinent negatives include no chest pain, no headaches and no shortness of breath. Nothing aggravates the symptoms. Nothing relieves the symptoms. Treatments tried: ibuprofen. The treatment provided no relief.       Home Medications Prior to Admission medications   Medication Sig Start Date End Date Taking? Authorizing Provider  ketorolac (TORADOL) 10 MG tablet Take 1 tablet (10 mg total) by mouth every 8 (eight) hours as needed for moderate pain (or stent discomfort post-operatively). 09/26/22   Loletta Parish., MD  ondansetron (ZOFRAN-ODT) 4 MG disintegrating tablet Take 1 tablet (4 mg total) by mouth every 8 (eight) hours as needed for nausea or vomiting. Patient taking differently: Take 4 mg by mouth every 8 (eight) hours as needed for nausea or vomiting (DISSOLVE ORALLY). 08/31/22   Elson Areas, PA-C  oxybutynin (DITROPAN) 5 MG tablet Take 1 tablet  (5 mg total) by mouth every 8 (eight) hours as needed for bladder spasms. 10/03/22   Rodolph Bong, MD  oxyCODONE-acetaminophen (PERCOCET) 5-325 MG tablet Take 1 tablet by mouth every 6 (six) hours as needed for severe pain (post-operatively). 10/03/22 10/03/23  Rodolph Bong, MD  polyethylene glycol (MIRALAX / GLYCOLAX) 17 g packet Take 17 g by mouth daily as needed for mild constipation. 10/03/22   Rodolph Bong, MD  senna-docusate (SENOKOT-S) 8.6-50 MG tablet Take 1 tablet by mouth 2 (two) times daily. 10/03/22   Rodolph Bong, MD  tamsulosin (FLOMAX) 0.4 MG CAPS capsule Take 1 capsule (0.4 mg total) by mouth daily. 10/04/22   Rodolph Bong, MD  THIOLA EC 300 MG TBEC Take 300 mg by mouth in the morning, at noon, and at bedtime. 08/30/21   [provider]      Allergies    Morphine and Other    Review of Systems   Review of Systems  Constitutional:  Negative for fever.  Respiratory:  Negative for shortness of breath.   Cardiovascular:  Negative for chest pain.  Gastrointestinal:  Positive for abdominal pain, nausea and vomiting.  Genitourinary:  Positive for flank pain. Negative for hematuria.  Musculoskeletal:  Positive for back pain.  Neurological:  Negative for headaches.    Physical Exam Updated Vital Signs BP (!) 141/91 (BP Location: Left Arm)   Pulse 98   Temp 97.7 F (36.5 C) (Oral)   Resp 16   Ht  5\' 10"  (1.778 m)   Wt 99.8 kg   SpO2 99%   BMI 31.57 kg/m  Physical Exam Vitals and nursing note reviewed.  Constitutional:      General: He is not in acute distress.    Appearance: Normal appearance. He is well-developed.  HENT:     Head: Normocephalic and atraumatic.  Eyes:     Conjunctiva/sclera: Conjunctivae normal.  Cardiovascular:     Rate and Rhythm: Normal rate and regular rhythm.     Heart sounds: No murmur heard. Pulmonary:     Effort: Pulmonary effort is normal. No respiratory distress.     Breath sounds: Normal breath sounds.   Abdominal:     Palpations: Abdomen is soft.     Tenderness: There is no abdominal tenderness. There is no guarding or rebound.  Musculoskeletal:        General: No deformity.     Cervical back: Neck supple.  Skin:    General: Skin is warm and dry.     Capillary Refill: Capillary refill takes less than 2 seconds.  Neurological:     General: No focal deficit present.     Mental Status: He is alert.     Motor: No weakness.     ED Results / Procedures / Treatments   Labs (all labs ordered are listed, but only abnormal results are displayed) Labs Reviewed  URINALYSIS, ROUTINE W REFLEX MICROSCOPIC  BASIC METABOLIC PANEL WITH GFR  CBC WITH DIFFERENTIAL/PLATELET    EKG None  Radiology No results found.  Procedures Procedures  {Document cardiac monitor, telemetry assessment procedure when appropriate:1}  Medications Ordered in ED Medications  ketorolac (TORADOL) 30 MG/ML injection 30 mg (has no administration in time range)  sodium chloride 0.9 % bolus 1,000 mL (has no administration in time range)  ondansetron (ZOFRAN) injection 4 mg (has no administration in time range)  HYDROmorphone (DILAUDID) injection 1 mg (has no administration in time range)    ED Course/ Medical Decision Making/ A&P   {   Click here for ABCD2, HEART and other calculatorsREFRESH Note before signing :1}                              Medical Decision Making Amount and/or Complexity of Data Reviewed Labs: ordered. Radiology: ordered.  Risk Prescription drug management.   This patient complains of ***; this involves an extensive number of treatment Options and is a complaint that carries with it a high risk of complications and morbidity. The differential includes ***  I ordered, reviewed and interpreted labs, which included *** I ordered medication *** and reviewed PMP when indicated. I ordered imaging studies which included *** and I independently    visualized and interpreted imaging  which showed *** Additional history obtained from *** Previous records obtained and reviewed *** I consulted *** and discussed lab and imaging findings and discussed disposition.  Cardiac monitoring reviewed, *** Social determinants considered, *** Critical Interventions: ***  After the interventions stated above, I reevaluated the patient and found *** Admission and further testing considered, ***   {Document critical care time when appropriate:1} {Document review of labs and clinical decision tools ie heart score, Chads2Vasc2 etc:1}  {Document your independent review of radiology images, and any outside records:1} {Document your discussion with family members, caretakers, and with consultants:1} {Document social determinants of health affecting pt's care:1} {Document your decision making why or why not admission, treatments were needed:1} Final Clinical Impression(s) /  ED Diagnoses Final diagnoses:  None    Rx / DC Orders ED Discharge Orders     None

## 2023-04-04 NOTE — Discharge Instructions (Signed)
 You were seen in the emergency department for pain in your flanks and lower abdomen.  You had lab work urinalysis and a CAT scan that did not show any obvious explanation for your symptoms.  You had some small stones up in the kidney but we did not see any in the ureters or bladder.  We are prescribing a short course of some pain medicine and Flomax along with some nausea medicine.  Please contact your urologist for close follow-up.  Return to the emergency department if any worsening or concerning symptoms

## 2023-04-04 NOTE — ED Triage Notes (Signed)
 Patient c/o kidney stones Bilateral flank pain started Thursday States they passed fragments Thursday Pain rated 9/10

## 2023-05-01 ENCOUNTER — Other Ambulatory Visit: Payer: Self-pay | Admitting: Urology

## 2023-05-04 NOTE — Patient Instructions (Signed)
 SURGICAL WAITING ROOM VISITATION  Patients having surgery or a procedure may have no more than 2 support people in the waiting area - these visitors may rotate.    Children under the age of 42 must have an adult with them who is not the patient.  Due to an increase in RSV and influenza rates and associated hospitalizations, children ages 64 and under may not visit patients in [facility] hospitals.  Visitors with respiratory illnesses are discouraged from visiting and should remain at home.  If the patient needs to stay at the hospital during part of their recovery, the visitor guidelines for inpatient rooms apply. Pre-op nurse will coordinate an appropriate time for 1 support person to accompany patient in pre-op.  This support person may not rotate.    Please refer to the [facility] website for the visitor guidelines for Inpatients (after your surgery is over and you are in a regular room).       Your procedure is scheduled on:  05/08/2023    Report to [facility] Main Entrance    Report to admitting at   [phone]   Call this number if you have problems the morning of surgery [phone]   Do not eat food or drink liquids  :After Midnight.                If you have questions, please contact your surgeon's office.  !     Oral Hygiene is also important to reduce your risk of infection.                                    Remember - BRUSH YOUR TEETH THE MORNING OF SURGERY WITH YOUR REGULAR TOOTHPASTE  DENTURES WILL BE REMOVED PRIOR TO SURGERY PLEASE DO NOT APPLY "Poly grip" OR ADHESIVES!!!   Do NOT smoke after Midnight   Stop all vitamins and herbal supplements 7 days before surgery.   Take these medicines the morning of surgery with A SIP OF WATER :  none   DO NOT TAKE ANY ORAL DIABETIC MEDICATIONS DAY OF YOUR SURGERY  Bring CPAP mask and tubing day of surgery.                              You may not have any metal on your body including hair pins, jewelry,  and body piercing             Do not wear make-up, lotions, powders, perfumes/cologne, or deodorant  Do not wear nail polish including gel and S&S, artificial/acrylic nails, or any other type of covering on natural nails including finger and toenails. If you have artificial nails, gel coating, etc. that needs to be removed by a nail salon please have this removed prior to surgery or surgery may need to be canceled/ delayed if the surgeon/ anesthesia feels like they are unable to be safely monitored.   Do not shave  48 hours prior to surgery.               Men may shave face and neck.   Do not bring valuables to the hospital. [facility] IS NOT             RESPONSIBLE   FOR VALUABLES.   Contacts, glasses, dentures or bridgework may not be worn into surgery.   Bring small overnight bag day of  surgery.   DO NOT BRING YOUR HOME MEDICATIONS TO THE HOSPITAL. PHARMACY WILL DISPENSE MEDICATIONS LISTED ON YOUR MEDICATION LIST TO YOU DURING YOUR ADMISSION IN THE HOSPITAL!    Patients discharged on the day of surgery will not be allowed to drive home.  Someone NEEDS to stay with you for the first 24 hours after anesthesia.   Special Instructions: Bring a copy of your healthcare power of attorney and living will documents the day of surgery if you haven't scanned them before.              Please read over the following fact sheets you were given: IF YOU HAVE QUESTIONS ABOUT YOUR PRE-OP INSTRUCTIONS PLEASE CALL [phone]   If you received a COVID test during your pre-op visit  it is requested that you wear a mask when out in public, stay away from anyone that may not be feeling well and notify your surgeon if you develop symptoms. If you test positive for Covid or have been in contact with anyone that has tested positive in the last 10 days please notify you surgeon.    [facility] - Preparing for Surgery Before surgery, you can play an important role.  Because skin is not sterile, your skin  needs to be as free of germs as possible.  You can reduce the number of germs on your skin by washing with CHG (chlorahexidine gluconate) soap before surgery.  CHG is an antiseptic cleaner which kills germs and bonds with the skin to continue killing germs even after washing. Please DO NOT use if you have an allergy to CHG or antibacterial soaps.  If your skin becomes reddened/irritated stop using the CHG and inform your nurse when you arrive at Short Stay. Do not shave (including legs and underarms) for at least 48 hours prior to the first CHG shower.  You may shave your face/neck. Please follow these instructions carefully:  1.  Shower with CHG Soap the night before surgery and the  morning of Surgery.  2.  If you choose to wash your hair, wash your hair first as usual with your  normal  shampoo.  3.  After you shampoo, rinse your hair and body thoroughly to remove the  shampoo.                           4.  Use CHG as you would any other liquid soap.  You can apply chg directly  to the skin and wash                       Gently with a scrungie or clean washcloth.  5.  Apply the CHG Soap to your body ONLY FROM THE NECK DOWN.   Do not use on face/ open                           Wound or open sores. Avoid contact with eyes, ears mouth and genitals (private parts).                       Wash face,  Genitals (private parts) with your normal soap.             6.  Wash thoroughly, paying special attention to the area where your surgery  will be performed.  7.  Thoroughly rinse your body with warm water  from the neck  down.  8.  DO NOT shower/wash with your normal soap after using and rinsing off  the CHG Soap.                9.  Pat yourself dry with a clean towel.            10.  Wear clean pajamas.            11.  Place clean sheets on your bed the night of your first shower and do not  sleep with pets. Day of Surgery : Do not apply any lotions/deodorants the morning of surgery.  Please wear clean  clothes to the hospital/surgery center.  FAILURE TO FOLLOW THESE INSTRUCTIONS MAY RESULT IN THE CANCELLATION OF YOUR SURGERY PATIENT SIGNATURE_________________________________  NURSE SIGNATURE__________________________________  ________________________________________________________________________

## 2023-05-04 NOTE — Progress Notes (Signed)
 Anesthesia Review:  [id number]:[id number] [facility] Main Family Medicine  Cardiologist : none   PPM/ ICD: Device Orders: Rep Notified:  Chest x-ray : EKG : Echo : Stress test: Cardiac Cath :   Activity level: can do a flight of stairs wtihout difficulty  Sleep Study/ CPAP : none  Fasting Blood Sugar :      / Checks Blood Sugar -- times a day:    Blood Thinner/ Instructions /Last Dose: ASA / Instructions/ Last Dose :    3pm telephone appt on 05/06/2023 Med hx and preop instructions completed .  PT aware at end of phone call to call Admitting at [phone]  .  PT voiced understanding.

## 2023-05-06 ENCOUNTER — Other Ambulatory Visit: Payer: Self-pay

## 2023-05-06 ENCOUNTER — Encounter (HOSPITAL_COMMUNITY)
Admission: RE | Admit: 2023-05-06 | Discharge: 2023-05-06 | Disposition: A | Discharge status: Home / Self Care | Source: Ambulatory Visit | Attending: Urology | Admitting: Urology

## 2023-05-06 VITALS — Ht 70.0 in | Wt 225.0 lb

## 2023-05-06 DIAGNOSIS — Z01818 Encounter for other preprocedural examination: Secondary | ICD-10-CM

## 2023-05-07 NOTE — Anesthesia Preprocedure Evaluation (Addendum)
 Anesthesia Evaluation  Patient identified by MRN, date of birth, ID band Patient awake    Reviewed: Allergy & Precautions, NPO status , Patient's Chart, lab work & pertinent test results  Airway Mallampati: III  TM Distance: >3 FB Neck ROM: Full    Dental no notable dental hx. (+) Teeth Intact, Dental Advisory Given   Pulmonary neg pulmonary ROS   Pulmonary exam normal breath sounds clear to auscultation       Cardiovascular Exercise Tolerance: Good (-) angina (-) Past MI Normal cardiovascular exam Rhythm:Regular Rate:Normal     Neuro/Psych negative neurological ROS  negative psych ROS   GI/Hepatic ,GERD  ,,  Endo/Other    Renal/GU Renal diseasenephrolithiasis     Musculoskeletal   Abdominal   Peds  Hematology   Anesthesia Other Findings All: morphine   Reproductive/Obstetrics                             Anesthesia Physical Anesthesia Plan  ASA: 2  Anesthesia Plan: General   Post-op Pain Management: Celebrex  PO (pre-op)* and Tylenol  PO (pre-op)*   Induction: Intravenous  PONV Risk Score and Plan: Treatment may vary due to age or medical condition, Midazolam  and Ondansetron   Airway Management Planned: LMA  Additional Equipment: None  Intra-op Plan:   Post-operative Plan:   Informed Consent: I have reviewed the patients History and Physical, chart, labs and discussed the procedure including the risks, benefits and alternatives for the proposed anesthesia with the patient or authorized representative who has indicated his/her understanding and acceptance.     Dental advisory given  Plan Discussed with: CRNA and Surgeon  Anesthesia Plan Comments:        Anesthesia Quick Evaluation

## 2023-05-08 ENCOUNTER — Ambulatory Visit (HOSPITAL_COMMUNITY)
Admission: RE | Admit: 2023-05-08 | Discharge: 2023-05-08 | Disposition: A | Discharge status: Home / Self Care | Attending: Urology | Admitting: Urology

## 2023-05-08 ENCOUNTER — Other Ambulatory Visit: Payer: Self-pay

## 2023-05-08 ENCOUNTER — Encounter (HOSPITAL_COMMUNITY): Payer: Self-pay | Admitting: Urology

## 2023-05-08 ENCOUNTER — Ambulatory Visit (HOSPITAL_BASED_OUTPATIENT_CLINIC_OR_DEPARTMENT_OTHER): Payer: Self-pay | Admitting: Anesthesiology

## 2023-05-08 ENCOUNTER — Encounter (HOSPITAL_COMMUNITY)
Admission: RE | Disposition: A | Discharge status: Home / Self Care | Payer: Self-pay | Source: Home / Self Care | Attending: Urology

## 2023-05-08 ENCOUNTER — Ambulatory Visit (HOSPITAL_COMMUNITY)

## 2023-05-08 ENCOUNTER — Ambulatory Visit (HOSPITAL_COMMUNITY): Payer: Self-pay | Admitting: Anesthesiology

## 2023-05-08 DIAGNOSIS — Z01818 Encounter for other preprocedural examination: Secondary | ICD-10-CM

## 2023-05-08 DIAGNOSIS — E7201 Cystinuria: Secondary | ICD-10-CM | POA: Insufficient documentation

## 2023-05-08 DIAGNOSIS — E669 Obesity, unspecified: Secondary | ICD-10-CM | POA: Insufficient documentation

## 2023-05-08 DIAGNOSIS — N200 Calculus of kidney: Secondary | ICD-10-CM | POA: Diagnosis present

## 2023-05-08 DIAGNOSIS — N1330 Unspecified hydronephrosis: Secondary | ICD-10-CM

## 2023-05-08 HISTORY — PX: CYSTOSCOPY W/ RETROGRADES: SHX1426

## 2023-05-08 HISTORY — PX: CYSTOSCOPY/URETEROSCOPY/HOLMIUM LASER/STENT PLACEMENT: SHX6546

## 2023-05-08 SURGERY — CYSTOSCOPY/URETEROSCOPY/HOLMIUM LASER/STENT PLACEMENT
Anesthesia: General | Site: Ureter | Laterality: Bilateral

## 2023-05-08 MED ORDER — OXYCODONE HCL 5 MG PO TABS: 5.0000 mg | ORAL_TABLET | Freq: Once | ORAL | Status: DC | PRN

## 2023-05-08 MED ORDER — CHLORHEXIDINE GLUCONATE 0.12 % MT SOLN
15.0000 mL | Freq: Once | OROMUCOSAL | Status: AC
  Administered 2023-05-08: 15 mL via OROMUCOSAL

## 2023-05-08 MED ORDER — SODIUM CHLORIDE 0.9 % IR SOLN
Status: DC | PRN
  Administered 2023-05-08: 6000 mL via INTRAVESICAL

## 2023-05-08 MED ORDER — DEXAMETHASONE SODIUM PHOSPHATE 10 MG/ML IJ SOLN
INTRAMUSCULAR | Status: AC
  Filled 2023-05-08: qty 1

## 2023-05-08 MED ORDER — DEXAMETHASONE SODIUM PHOSPHATE 10 MG/ML IJ SOLN
INTRAMUSCULAR | Status: DC | PRN
  Administered 2023-05-08: 10 mg via INTRAVENOUS

## 2023-05-08 MED ORDER — ORAL CARE MOUTH RINSE: 15.0000 mL | Freq: Once | OROMUCOSAL | Status: AC

## 2023-05-08 MED ORDER — MIDAZOLAM HCL 5 MG/5ML IJ SOLN
INTRAMUSCULAR | Status: DC | PRN
  Administered 2023-05-08: 2 mg via INTRAVENOUS

## 2023-05-08 MED ORDER — GENTAMICIN SULFATE 40 MG/ML IJ SOLN
5.0000 mg/kg | INTRAVENOUS | Status: AC
  Administered 2023-05-08: 420 mg via INTRAVENOUS
  Filled 2023-05-08: qty 10.5

## 2023-05-08 MED ORDER — ONDANSETRON HCL 4 MG/2ML IJ SOLN: 4.0000 mg | Freq: Once | INTRAMUSCULAR | Status: DC | PRN

## 2023-05-08 MED ORDER — LACTATED RINGERS IV SOLN: INTRAVENOUS | Status: DC

## 2023-05-08 MED ORDER — OXYCODONE HCL 5 MG/5ML PO SOLN: 5.0000 mg | Freq: Once | ORAL | Status: DC | PRN

## 2023-05-08 MED ORDER — IOHEXOL 300 MG/ML  SOLN
INTRAMUSCULAR | Status: DC | PRN
  Administered 2023-05-08: 19 mL

## 2023-05-08 MED ORDER — PROPOFOL 10 MG/ML IV BOLUS
INTRAVENOUS | Status: AC
  Filled 2023-05-08: qty 20

## 2023-05-08 MED ORDER — CELECOXIB 200 MG PO CAPS
200.0000 mg | ORAL_CAPSULE | Freq: Once | ORAL | Status: AC
  Administered 2023-05-08: 200 mg via ORAL
  Filled 2023-05-08: qty 1

## 2023-05-08 MED ORDER — CEPHALEXIN 500 MG PO CAPS
500.0000 mg | ORAL_CAPSULE | Freq: Two times a day (BID) | ORAL | 0 refills | Status: DC
Start: 2023-05-08 — End: 2023-05-15

## 2023-05-08 MED ORDER — DIPHENHYDRAMINE HCL 50 MG/ML IJ SOLN
INTRAMUSCULAR | Status: DC
Start: 2023-05-08 — End: 2023-05-08
  Filled 2023-05-08: qty 1

## 2023-05-08 MED ORDER — ONDANSETRON HCL 4 MG/2ML IJ SOLN
INTRAMUSCULAR | Status: AC
  Filled 2023-05-08: qty 2

## 2023-05-08 MED ORDER — FENTANYL CITRATE (PF) 100 MCG/2ML IJ SOLN
INTRAMUSCULAR | Status: AC
  Filled 2023-05-08: qty 2

## 2023-05-08 MED ORDER — DEXMEDETOMIDINE HCL IN NACL 80 MCG/20ML IV SOLN
INTRAVENOUS | Status: DC | PRN
  Administered 2023-05-08: 4 ug via INTRAVENOUS
  Administered 2023-05-08: 8 ug via INTRAVENOUS

## 2023-05-08 MED ORDER — MIDAZOLAM HCL 2 MG/2ML IJ SOLN
INTRAMUSCULAR | Status: AC
  Filled 2023-05-08: qty 2

## 2023-05-08 MED ORDER — HYDROMORPHONE HCL 1 MG/ML IJ SOLN
INTRAMUSCULAR | Status: AC
  Filled 2023-05-08: qty 1

## 2023-05-08 MED ORDER — ACETAMINOPHEN 500 MG PO TABS
1000.0000 mg | ORAL_TABLET | Freq: Once | ORAL | Status: AC
  Administered 2023-05-08: 1000 mg via ORAL
  Filled 2023-05-08: qty 2

## 2023-05-08 MED ORDER — TAMSULOSIN HCL 0.4 MG PO CAPS: 0.4000 mg | ORAL_CAPSULE | Freq: Every day | ORAL | 1 refills | Status: DC

## 2023-05-08 MED ORDER — AMISULPRIDE (ANTIEMETIC) 5 MG/2ML IV SOLN: 10.0000 mg | Freq: Once | INTRAVENOUS | Status: DC | PRN

## 2023-05-08 MED ORDER — PROPOFOL 10 MG/ML IV BOLUS
INTRAVENOUS | Status: DC | PRN
  Administered 2023-05-08: 190 mg via INTRAVENOUS

## 2023-05-08 MED ORDER — HYDROMORPHONE HCL 1 MG/ML IJ SOLN
0.2500 mg | INTRAMUSCULAR | Status: DC | PRN
  Administered 2023-05-08 (×4): 0.5 mg via INTRAVENOUS

## 2023-05-08 MED ORDER — DIPHENHYDRAMINE HCL 50 MG/ML IJ SOLN
12.5000 mg | Freq: Once | INTRAMUSCULAR | Status: AC
  Administered 2023-05-08: 12.5 mg via INTRAVENOUS

## 2023-05-08 MED ORDER — ACETAMINOPHEN 10 MG/ML IV SOLN: 1000.0000 mg | Freq: Once | INTRAVENOUS | Status: DC | PRN

## 2023-05-08 MED ORDER — KETOROLAC TROMETHAMINE 10 MG PO TABS: 10.0000 mg | ORAL_TABLET | Freq: Three times a day (TID) | ORAL | 0 refills | Status: DC | PRN

## 2023-05-08 MED ORDER — ONDANSETRON HCL 4 MG/2ML IJ SOLN
INTRAMUSCULAR | Status: DC | PRN
  Administered 2023-05-08: 4 mg via INTRAVENOUS

## 2023-05-08 MED ORDER — LIDOCAINE 2% (20 MG/ML) 5 ML SYRINGE
INTRAMUSCULAR | Status: DC | PRN
  Administered 2023-05-08: 100 mg via INTRAVENOUS

## 2023-05-08 MED ORDER — FENTANYL CITRATE (PF) 100 MCG/2ML IJ SOLN
INTRAMUSCULAR | Status: DC | PRN
  Administered 2023-05-08 (×2): 50 ug via INTRAVENOUS

## 2023-05-08 MED ORDER — OXYCODONE-ACETAMINOPHEN 5-325 MG PO TABS: 1.0000 | ORAL_TABLET | Freq: Four times a day (QID) | ORAL | 0 refills | Status: DC | PRN

## 2023-05-08 SURGICAL SUPPLY — 21 items
BAG URO CATCHER STRL LF (MISCELLANEOUS) ×1 IMPLANT
BASKET LASER NITINOL 1.9FR (BASKET) IMPLANT
CATH URETL OPEN END 6FR 70 (CATHETERS) ×1 IMPLANT
CLOTH BEACON ORANGE TIMEOUT ST (SAFETY) ×1 IMPLANT
EXTRACTOR STONE 1.7FRX115CM (UROLOGICAL SUPPLIES) IMPLANT
GLOVE SURG LX STRL 7.5 STRW (GLOVE) ×1 IMPLANT
GOWN STRL REUS W/ TWL XL LVL3 (GOWN DISPOSABLE) ×1 IMPLANT
GUIDEWIRE ANG ZIPWIRE 038X150 (WIRE) ×1 IMPLANT
GUIDEWIRE STR DUAL SENSOR (WIRE) ×1 IMPLANT
KIT TURNOVER KIT A (KITS) IMPLANT
LASER FIB FLEXIVA PULSE ID 365 (Laser) IMPLANT
MANIFOLD NEPTUNE II (INSTRUMENTS) ×1 IMPLANT
NS IRRIG 1000ML POUR BTL (IV SOLUTION) IMPLANT
PACK CYSTO (CUSTOM PROCEDURE TRAY) ×1 IMPLANT
SHEATH NAVIGATOR HD 11/13X28 (SHEATH) IMPLANT
SHEATH NAVIGATOR HD 11/13X36 (SHEATH) IMPLANT
STENT POLARIS 5FRX24 (STENTS) IMPLANT
TRACTIP FLEXIVA PULS ID 200XHI (Laser) IMPLANT
TUBE PU 8FR 16IN ENFIT (TUBING) ×1 IMPLANT
TUBING CONNECTING 10 (TUBING) ×1 IMPLANT
TUBING UROLOGY SET (TUBING) ×1 IMPLANT

## 2023-05-08 NOTE — Brief Op Note (Signed)
 05/08/2023  1:32 PM  PATIENT:  Devin Sanders  43 y.o. male  PRE-OPERATIVE DIAGNOSIS:  RECURRENT RENAL STONES  POST-OPERATIVE DIAGNOSIS:  RECURRENT RENAL STONES  PROCEDURE:  Procedure(s): CYSTOSCOPY/URETEROSCOPY/HOLMIUM LASER/STENT PLACEMENT (Bilateral) CYSTOSCOPY, WITH RETROGRADE PYELOGRAM (Bilateral)  SURGEON:  Surgeons and Role:    * Manny, Harvey Linen., MD - Primary  PHYSICIAN ASSISTANT:   ASSISTANTS: none   ANESTHESIA:   general  EBL:  2 mL   BLOOD ADMINISTERED:none  DRAINS: none   LOCAL MEDICATIONS USED:  NONE  SPECIMEN:  No Specimen  DISPOSITION OF SPECIMEN:  N/A  COUNTS:  YES  TOURNIQUET:  * No tourniquets in log *  DICTATION: .Other Dictation: Dictation Number [id number]  PLAN OF CARE: Discharge to home after PACU  PATIENT DISPOSITION:  PACU - hemodynamically stable.   Delay start of Pharmacological VTE agent (>24hrs) due to surgical blood loss or risk of bleeding: yes

## 2023-05-08 NOTE — Anesthesia Procedure Notes (Signed)
 Procedure Name: LMA Insertion Date/Time: 05/08/2023 12:41 PM  Performed by: Ezzie Holstein, CRNAPre-anesthesia Checklist: Patient identified, Emergency Drugs available, Suction available and Patient being monitored Patient Re-evaluated:Patient Re-evaluated prior to induction Oxygen  Delivery Method: Circle System Utilized Preoxygenation: Pre-oxygenation with 100% oxygen  Induction Type: IV induction Ventilation: Mask ventilation without difficulty LMA: LMA inserted LMA Size: 5.0 Number of attempts: 1 Placement Confirmation: positive ETCO2 Tube secured with: Tape Dental Injury: Teeth and Oropharynx as per pre-operative assessment

## 2023-05-08 NOTE — Discharge Instructions (Addendum)
 1 - You may have urinary urgency (bladder spasms) and bloody urine on / off with stent in place. This is normal.  2 - Remove tethered stents next Thursday morning at home by pulling on strings, then blue-white plastic tubing, and discarding. There are TWO stents.   3 - Call MD or go to ER for fever >102, severe pain / nausea / vomiting not relieved by medications, or acute change in medical status

## 2023-05-08 NOTE — Transfer of Care (Signed)
 Immediate Anesthesia Transfer of Care Note  Patient: Devin Sanders  Procedure(s) Performed: CYSTOSCOPY/URETEROSCOPY/HOLMIUM LASER/STENT PLACEMENT (Bilateral: Ureter) CYSTOSCOPY, WITH RETROGRADE PYELOGRAM (Bilateral: Ureter)  Patient Location: PACU  Anesthesia Type:General  Level of Consciousness: awake, alert , oriented, and patient cooperative  Airway & Oxygen  Therapy: Patient Spontanous Breathing and Patient connected to face mask oxygen   Post-op Assessment: Report given to RN and Post -op Vital signs reviewed and stable  Post vital signs: Reviewed and stable  Last Vitals:  Vitals Value Taken Time  BP 134/93 05/08/23 1332  Temp    Pulse 90 05/08/23 1333  Resp 13 05/08/23 1334  SpO2 92 % 05/08/23 1333  Vitals shown include unfiled device data.  Last Pain:  Vitals:   05/08/23 1017  TempSrc:   PainSc: 0-No pain         Complications: No notable events documented.

## 2023-05-08 NOTE — H&P (Signed)
 Devin Sanders is an 43 y.o. male.    Chief Complaint: Pre-OP BILATERAL Ureteroscopy  HPI:   1 - Recurrent Nephrolithiasis -  Pre 2023 - PCNL several each side, URS x innumerable each side at [facility] and at [facility]   Recent Course -  09/2021 - Bilaterl URS to stone free (punctate pap tip calcs only)  02/2022 - Bilateral URS to stone free  09/2022 - Bilateral URS to stone free (punctate pap tip calcs only)   Recent Surveillance:  06/2022 - KUB, Renal US  - bilateral pap tip calcs L>R, no hydro  02/2023 - KUB, Renal US  - bilateral pap tip calcs L>R, no hydro   2 - Cysteinuria - ON thiola  900 QD / day PLUS K-Cit 2000MEQ BID meals for cysteinuria.   3 - Desire for Male Sterilization - s/p OR vasectomy at time of ureteroscopy 09/2021. Post-vas sample 12/2021 with azospermia, cleared for unprotected intercourse.   PMH sig for mild obesity. 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 His PCP is Mearl Spice MD.   Today " Devin Sanders " is seen to proceed with BILATERAL ureteroscopic stone manipulation. Most recent UA without infectious parameters.   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)   GERD (gastroesophageal reflux disease)    History of kidney stones    Left ureteral calculus    Renal calculus, left    Wears glasses     Past Surgical History:  Procedure Laterality Date   CYSTOSCOPY WITH RETROGRADE PYELOGRAM, URETEROSCOPY AND STENT PLACEMENT Bilateral 06/26/2017   Procedure: CYSTOSCOPY WITH RETROGRADE PYELOGRAM, URETEROSCOPY, STONE BASKETRY  AND STENT PLACEMENT;  Surgeon: Osborn Blaze, MD;  [facility];  Service: Urology;  Laterality: Bilateral;   CYSTOSCOPY WITH RETROGRADE PYELOGRAM, URETEROSCOPY AND STENT PLACEMENT Right 09/18/2017   Procedure: CYSTOSCOPY WITH RETROGRADE PYELOGRAM, URETEROSCOPY AND STENT PLACEMENT;   Surgeon: Andrez Banker, MD;  Location: [facility];  Service: Urology;  Laterality: Right;   CYSTOSCOPY WITH RETROGRADE PYELOGRAM, URETEROSCOPY AND STENT PLACEMENT Bilateral 10/13/2017   Procedure: CYSTOSCOPY WITH RETROGRADE PYELOGRAM, BILATERAL URETEROSCOPY AND BILATERAL STENT PLACEMENT, LASER;  Surgeon: Osborn Blaze, MD;  Location: [facility];  Service: Urology;  Laterality: Bilateral;  90 MINS   CYSTOSCOPY WITH RETROGRADE PYELOGRAM, URETEROSCOPY AND STENT PLACEMENT Bilateral 03/24/2018   Procedure: CYSTOSCOPY WITH RETROGRADE PYELOGRAM, URETEROSCOPY AND STENT PLACEMENT;  Surgeon: Osborn Blaze, MD;  Location: [facility];  Service: Urology;  Laterality: Bilateral;  1 HR   CYSTOSCOPY WITH RETROGRADE PYELOGRAM, URETEROSCOPY AND STENT PLACEMENT Bilateral 07/07/2018   Procedure: CYSTOSCOPY WITH RETROGRADE PYELOGRAM, URETEROSCOPY AND STENT PLACEMENT;  Surgeon: Osborn Blaze, MD;  Location: [facility];  Service: Urology;  Laterality: Bilateral;  75 MINUTES   CYSTOSCOPY WITH RETROGRADE PYELOGRAM, URETEROSCOPY AND STENT PLACEMENT Left 01/12/2019   Procedure: CYSTOSCOPY WITH RETROGRADE PYELOGRAM, URETEROSCOPY AND STENT PLACEMENT;  Surgeon: Osborn Blaze, MD;  Location: [facility];  Service: Urology;  Laterality: Left;  90 MINS   CYSTOSCOPY WITH RETROGRADE PYELOGRAM, URETEROSCOPY AND STENT PLACEMENT Left 01/28/2019   Procedure: CYSTOSCOPY WITH RETROGRADE PYELOGRAM, URETEROSCOPY AND STENT EXCHANGE STONE BASKET EXTRACTION ;  Surgeon: Osborn Blaze, MD;  Location: [facility];  Service: Urology;  Laterality: Left;   CYSTOSCOPY WITH RETROGRADE PYELOGRAM, URETEROSCOPY AND STENT PLACEMENT Bilateral 05/13/2019   Procedure: CYSTOSCOPY WITH BILATERAL  RETROGRADE PYELOGRAM, LEFT URETEROSCOPY WITH HOLMIUM LASER AND STENT PLACEMENT;  Surgeon: Osborn Blaze, MD;  Location: [facility];  Service: Urology;  Laterality: Bilateral;   CYSTOSCOPY WITH RETROGRADE PYELOGRAM, URETEROSCOPY AND STENT PLACEMENT Bilateral 11/02/2019    Procedure: CYSTOSCOPY WITH RETROGRADE PYELOGRAM, URETEROSCOPY AND STENT PLACEMENT;  Surgeon: Osborn Blaze, MD;  Location: [facility];  Service: Urology;  Laterality: Bilateral;  90 MINS   CYSTOSCOPY WITH RETROGRADE PYELOGRAM, URETEROSCOPY AND STENT PLACEMENT Bilateral 10/12/2020   Procedure: CYSTOSCOPY WITH RETROGRADE PYELOGRAM, URETEROSCOPY, BASKETING OF STONES AND BILATERAL STENT PLACEMENTS;  Surgeon: Osborn Blaze, MD;  Location: [facility];  Service: Urology;  Laterality: Bilateral;  75 MINS   CYSTOSCOPY WITH RETROGRADE PYELOGRAM, URETEROSCOPY AND STENT PLACEMENT Bilateral 02/15/2021   Procedure: CYSTOSCOPY WITH RETROGRADE PYELOGRAM, URETEROSCOPY AND STENT PLACEMENT;  Surgeon: Osborn Blaze, MD;  Location: [facility];  Service: Urology;  Laterality: Bilateral;  75 MINS   CYSTOSCOPY WITH RETROGRADE PYELOGRAM, URETEROSCOPY AND STENT PLACEMENT Bilateral 09/27/2021   Procedure: CYSTOSCOPY WITH RETROGRADE PYELOGRAM, URETEROSCOPY AND STENT PLACEMENT, VASECTOMY;  Surgeon: Osborn Blaze, MD;  Location: [facility];  Service: Urology;  Laterality: Bilateral;  75 MINS   CYSTOSCOPY WITH RETROGRADE PYELOGRAM, URETEROSCOPY AND STENT PLACEMENT Bilateral 02/12/2022   Procedure: CYSTOSCOPY WITH RETROGRADE PYELOGRAM, URETEROSCOPY AND STENT PLACEMENT;  Surgeon: Osborn Blaze, MD;  Location: [facility];  Service: Urology;  Laterality: Bilateral;  75 MINS   CYSTOSCOPY/RETROGRADE/URETEROSCOPY/STONE EXTRACTION WITH BASKET Left 10/05/2017   Procedure: CYSTOSCOPY/RETROGRADE/STONE REMOVAL FROM BLADDER ;  Surgeon: Homero Luster, MD;  Location: [facility];  Service: Urology;  Laterality: Left;   CYSTOSCOPY/URETEROSCOPY/HOLMIUM LASER/STENT PLACEMENT Right 09/28/2017   Procedure: CYSTOSCOPY/RIGHT RETROGRADE URETEROSCOPY/HOLMIUM LASER/ RIGHT STENT PLACEMENT;  Surgeon: Adelbert Homans, MD;  Location: [facility];  Service: Urology;  Laterality: Right;   CYSTOSCOPY/URETEROSCOPY/HOLMIUM LASER/STENT PLACEMENT Bilateral 01/27/2018    Procedure: CYSTOSCOPY/URETEROSCOPY/HOLMIUM LASER/STENT PLACEMENT;  Surgeon: Osborn Blaze, MD;  Location: [facility];  Service: Urology;  Laterality: Bilateral;   CYSTOSCOPY/URETEROSCOPY/HOLMIUM LASER/STENT PLACEMENT Right 08/08/2020   Procedure: CYSTOSCOPY/URETEROSCOPY/RETROGRADE/ HOLMIUM LASER/STENT PLACEMENT;  Surgeon: Trent Frizzle, MD;  Location: [facility];  Service: Urology;  Laterality: Right;   CYSTOSCOPY/URETEROSCOPY/HOLMIUM LASER/STENT PLACEMENT Bilateral 09/26/2022   Procedure: CYSTOSCOPY BILATERAL RETROGRADE URETEROSCOPY/HOLMIUM LASER/STENT PLACEMENT;  Surgeon: Melody Spurling., MD;  Location[facility];  Service: Urology;  Laterality: Bilateral;   HOLMIUM LASER APPLICATION Bilateral 06/26/2017   Procedure: HOLMIUM LASER APPLICATION;  Surgeon: Osborn Blaze, MD;  Location: [facility];  Service: Urology;  Laterality: Bilateral;   HOLMIUM LASER APPLICATION Right 09/18/2017   Procedure: HOLMIUM LASER APPLICATION;  Surgeon: Andrez Banker, MD;  Location[facility];  Service: Urology;  Laterality: Right;   HOLMIUM LASER APPLICATION Bilateral 10/13/2017   Procedure: HOLMIUM LASER APPLICATION;  Surgeon: Osborn Blaze, MD;  Location: [facility];  Service: Urology;  Laterality: Bilateral;   HOLMIUM LASER APPLICATION Bilateral 01/27/2018   Procedure: HOLMIUM LASER APPLICATION;  Surgeon: Osborn Blaze, MD;  Location: [facility];  Service: Urology;  Laterality: Bilateral;   HOLMIUM LASER APPLICATION Bilateral 03/24/2018   Procedure: HOLMIUM LASER APPLICATION;  Surgeon: Osborn Blaze, MD;  Location: [facility];  Service: Urology;  Laterality: Bilateral;   HOLMIUM LASER APPLICATION Bilateral 07/07/2018   Procedure: HOLMIUM LASER APPLICATION;  Surgeon: Osborn Blaze, MD;  Location: [facility];  Service: Urology;  Laterality: Bilateral;   HOLMIUM LASER APPLICATION Left 01/12/2019   Procedure: HOLMIUM LASER APPLICATION;  Surgeon: Osborn Blaze, MD;  Location[facility];  Service: Urology;  Laterality: Left;   HOLMIUM LASER APPLICATION Bilateral 11/02/2019   Procedure: HOLMIUM LASER  APPLICATION;  Surgeon: Osborn Blaze, MD;  Location: [facility];  Service: Urology;  Laterality: Bilateral;   HOLMIUM LASER APPLICATION Bilateral 02/15/2021   Procedure: HOLMIUM LASER APPLICATION;  Surgeon: Osborn Blaze, MD;  Location: [facility];  Service: Urology;  Laterality: Bilateral;   HOLMIUM LASER APPLICATION Bilateral 09/27/2021   Procedure: HOLMIUM LASER APPLICATION;  Surgeon: Osborn Blaze, MD;  Location: [facility];  Service: Urology;  Laterality: Bilateral;   HOLMIUM LASER APPLICATION Bilateral 02/12/2022   Procedure: HOLMIUM LASER APPLICATION;  Surgeon: Osborn Blaze, MD;  Location: [facility];  Service: Urology;  Laterality: Bilateral;   PERCUTANEOUS NEPHROSTOLITHOTOMY  2003;  2009;  06-22-2014; 01-30-2015  @ [facility]   URETEROSCOPIC STONE MANIPULATION UNILATERAL  multiple since 1997--  last one 02-19-2017  @ [facility]    No family history on file. Social History:  reports that he has never smoked. He has never used smokeless tobacco. He reports that he does not drink alcohol and does not use drugs.  Allergies:  Allergies  Allergen Reactions   Morphine  Nausea And Vomiting   Other Hives and Swelling    Mangos    No medications prior to admission.    No results found for this or any previous visit (from the past 48 hours). No results found.  Review of Systems  Constitutional:  Negative for chills and fever.  All other systems reviewed and are negative.   There were no vitals taken for this visit. Physical Exam Vitals reviewed.  HENT:     Head: Normocephalic.     Nose: Nose normal.  Eyes:     Pupils: Pupils are equal, round, and reactive to light.  Cardiovascular:     Rate and Rhythm: Normal rate.  Pulmonary:     Effort: Pulmonary effort is normal.  Abdominal:     General: Abdomen is flat.  Genitourinary:     Comments: Minimal CVAT present Musculoskeletal:        General: Normal range of motion.     Cervical back: Normal range of motion.  Skin:    General: Skin is warm.  Neurological:     General: No focal deficit present.     Mental Status: He is alert.  Psychiatric:        Mood and Affect: Mood normal.      Assessment/Plan  Proceed as planned with BILATERAL ureteroscopic stone manipulation. Risks, benefits, alternatives, expected peri-op course discussed previously and reiterated today.   Melody Spurling., MD 05/08/2023, 7:32 AM

## 2023-05-11 ENCOUNTER — Encounter (HOSPITAL_COMMUNITY): Payer: Self-pay | Admitting: Urology

## 2023-05-11 NOTE — Anesthesia Postprocedure Evaluation (Signed)
 Anesthesia Post Note  Patient: Devin Sanders  Procedure(s) Performed: CYSTOSCOPY/URETEROSCOPY/HOLMIUM LASER/STENT PLACEMENT (Bilateral: Ureter) CYSTOSCOPY, WITH RETROGRADE PYELOGRAM (Bilateral: Ureter)     Patient location during evaluation: PACU Anesthesia Type: General Level of consciousness: awake and alert Pain management: pain level controlled Vital Signs Assessment: post-procedure vital signs reviewed and stable Respiratory status: spontaneous breathing, nonlabored ventilation, respiratory function stable and patient connected to nasal cannula oxygen  Cardiovascular status: blood pressure returned to baseline and stable Postop Assessment: no apparent nausea or vomiting Anesthetic complications: no   No notable events documented.  Last Vitals:  Vitals:   05/08/23 1445 05/08/23 1500  BP: (!) 124/91 (!) 129/91  Pulse: 88 84  Resp: 20   Temp:    SpO2: 100% 93%    Last Pain:  Vitals:   05/08/23 1500  TempSrc:   PainSc: 0-No pain                 Leslye Rast

## 2023-05-11 NOTE — Op Note (Unsigned)
 NAME: Devin Sanders, Devin Sanders MEDICAL RECORD NO: [id number] ACCOUNT NO: [id number] DATE OF BIRTH: 1980/08/21 FACILITY: [facility] LOCATION: [facility] PHYSICIAN: Osborn Blaze, MD  Operative Report   DATE OF PROCEDURE: 05/08/2023  PREOPERATIVE DIAGNOSES:  Bilateral renal stones and cystinuria.  PROCEDURE PERFORMED: 1.  Cystoscopy with bilateral retrograde pyelograms interpretation. 2.  Bilateral ureteroscopy with laser lithotripsy. 3.  Insertion of bilateral ureteral stents.  ESTIMATED BLOOD LOSS:  Nil.  COMPLICATIONS:  None.  SPECIMENS:  None.  FINDINGS: 1.  Bilateral papillary tip calcifications consistent with known cystinuria.  No evidence of obstructing stones. 2.  Complete resolution of all accessible stone fragments larger than one-third mm following laser lithotripsy. 3.  Successful placement of bilateral ureteral stents, proximal end in the renal pelvis, distal end in urinary bladder, with tether.  INDICATIONS:  The patient is an incredibly pleasant 43 year old male with a lifelong history of cystinuria.  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 He is very compliant with medical therapy and requires ureteroscopy  usually 1-2 times a year to render stone free.  He was more bothered by left flank pain, which is usually his presenting symptom, starting approximately a month ago.  He called and requested bilateral ureteroscopic cleanout.  It has been over 6 months  since his prior surgery, and it was clearly felt this was warranted.  Informed consent was obtained and placed in medical record.  DESCRIPTION OF PROCEDURE:  The patient being verified, procedure being bilateral ureteroscopic stone manipulation was confirmed.  Procedure timeout was performed.  Intravenous antibiotics were administered. General anesthesia induced. The patient was  placed into a low lithotomy position.  Sterile field was created, prepping and draping the patient's penis, perineum and  proximal thigh using iodine.  Cystourethroscopy performed using a 21-French rigid cystoscope with offset lens.  Inspection of  anterior and posterior urethra unremarkable.  Inspection of urinary bladder revealed no diverticula, calcifications, papillary lesions.  The right ureteral orifice was cannulated with a 6-French end-hole catheter, and right retrograde pyelogram was  obtained.  Right retrograde pyelogram demonstrated single right ureter, single system right kidney.  No filling defects or narrowing noted.  A 0.038 ZIPwire was advanced to the level of the upper pole, set aside as a safety wire.  Next, left retrograde pyelogram  was obtained.  Left retrograde pyelogram demonstrated single left ureter, single system left kidney.  No filling defects or narrowing noted.  A 0.038 ZIPwire was advanced to the level of the upper pole, set aside as a safety wire.  An 8-French feeding tube placed in  the urinary bladder for pressure release and semirigid ureteroscopy was performed of the distal four-fifths of the left ureter alongside a separate sensor working wire.  No mucosal abnormalities were found.  Next, semirigid ureteroscopy was performed of  the distal four-fifths of the right ureter alongside a separate sensor working wire.  No mucosal abnormalities were found.  The semirigid scope was then exchanged for a medium-length ureteral access sheath to the level of the proximal ureter using  continuous fluoroscopic guidance and flexible digital ureteroscopy performed of the proximal right ureter and systematic inspection was placed in the right kidney including all the calyces x3. There was a dominant papillary tip calcification noted in the  upper mid calyx.  It was just too large for simple basketing, and holmium laser energy was applied using a setting of 0.2 joules and 20 Hz.  Using a dusting technique, the stone was completely ablated into fragments that were  less than one-third mm.   Following this  complete resolution of all accessible stone fragments larger than one-third mm, excellent hemostasis and no evidence of perforation, access sheath was removed under continuous vision, no significant mucosal abnormalities were found.  Next,  the access sheath was placed over the left sensor working wire at the level of the proximal left ureter using continuous fluoroscopic guidance and flexible digital ureteroscopy performed in the proximal left ureter and systematic inspection of the left  kidney including all calyces x3.  There was a dominant papillary tip calcification noted in the extreme lower pole calyx.  This also was too large for simple basketing, and holmium laser energy was similarly applied using a setting of 0.2 joules and 20  Hz.  The stone was completely dusted into fragments less than one-third mm.  There was an area in the lower mid calyx with some infundibular stenosis. This has been noted previously.  I did worry about obstruction, and this infundibulum was opened up  similarly with the laser fiber using settings of 1 joule and 10 Hz, which allowed better inspection of the lower mid infundibulum and associated calyces, and no stones were noted in this location.  Access sheath was removed under continuous vision.  No  significant mucosal abnormalities were found.  Given the bilateral nature of the surgery, I felt that brief interval stenting with tether seems to be most prudent.  Bilateral 5 x 24 Polaris type stents were carefully placed using fluoroscopic guidance.   Good proximal and distal planes were noted.  Tethers were left in place, trimmed to length, tied together, taped to the dorsum of the penis.  Procedure was terminated. The patient tolerated the procedure well.  No immediate periprocedural complications.   The patient was taken to the postanesthesia care unit in stable condition.  We will plan for discharge home.   SHW D: 05/08/2023 1:37:44 pm T: 05/09/2023 12:59:00 am  JOB:  [id number]/ [id number]

## 2023-05-14 ENCOUNTER — Encounter (HOSPITAL_COMMUNITY): Payer: Self-pay

## 2023-05-14 ENCOUNTER — Other Ambulatory Visit: Payer: Self-pay

## 2023-05-14 ENCOUNTER — Observation Stay (HOSPITAL_COMMUNITY)
Admission: EM | Admit: 2023-05-14 | Discharge: 2023-05-15 | Disposition: A | Discharge status: Home / Self Care | Attending: Family Medicine | Admitting: Family Medicine

## 2023-05-14 DIAGNOSIS — Z960 Presence of urogenital implants: Principal | ICD-10-CM

## 2023-05-14 DIAGNOSIS — R300 Dysuria: Secondary | ICD-10-CM | POA: Diagnosis present

## 2023-05-14 DIAGNOSIS — N200 Calculus of kidney: Secondary | ICD-10-CM | POA: Diagnosis not present

## 2023-05-14 DIAGNOSIS — E871 Hypo-osmolality and hyponatremia: Secondary | ICD-10-CM | POA: Insufficient documentation

## 2023-05-14 DIAGNOSIS — R319 Hematuria, unspecified: Secondary | ICD-10-CM

## 2023-05-14 DIAGNOSIS — R109 Unspecified abdominal pain: Secondary | ICD-10-CM | POA: Diagnosis present

## 2023-05-14 LAB — COMPREHENSIVE METABOLIC PANEL WITH GFR
ALT: 31 U/L (ref 0–44)
AST: 29 U/L (ref 15–41)
Albumin: 4.3 g/dL (ref 3.5–5.0)
Alkaline Phosphatase: 61 U/L (ref 38–126)
Anion gap: 9 (ref 5–15)
BUN: 18 mg/dL (ref 6–20)
CO2: 25 mmol/L (ref 22–32)
Calcium: 9.2 mg/dL (ref 8.9–10.3)
Chloride: 99 mmol/L (ref 98–111)
Creatinine, Ser: 1.1 mg/dL (ref 0.61–1.24)
GFR, Estimated: >60 mL/min (ref >60)
Glucose, Bld: 96 mg/dL (ref 70–99)
Potassium: 4.5 mmol/L (ref 3.5–5.1)
Sodium: 133 mmol/L — ABNORMAL LOW (ref 135–145)
Total Bilirubin: 0.9 mg/dL (ref 0.0–1.2)
Total Protein: 7.3 g/dL (ref 6.5–8.1)

## 2023-05-14 LAB — CBC WITH DIFFERENTIAL/PLATELET
Abs Immature Granulocytes: 0.02 10*3/uL (ref 0.00–0.07)
Basophils Absolute: 0 10*3/uL (ref 0.0–0.1)
Basophils Relative: 0 %
Eosinophils Absolute: 0.2 10*3/uL (ref 0.0–0.5)
Eosinophils Relative: 3 %
HCT: 42.1 % (ref 39.0–52.0)
Hemoglobin: 14.5 g/dL (ref 13.0–17.0)
Immature Granulocytes: 0 %
Lymphocytes Relative: 22 %
Lymphs Abs: 1.5 10*3/uL (ref 0.7–4.0)
MCH: 33.6 pg (ref 26.0–34.0)
MCHC: 34.4 g/dL (ref 30.0–36.0)
MCV: 97.7 fL (ref 80.0–100.0)
Monocytes Absolute: 0.4 10*3/uL (ref 0.1–1.0)
Monocytes Relative: 6 %
Neutro Abs: 4.5 10*3/uL (ref 1.7–7.7)
Neutrophils Relative %: 69 %
Platelets: 261 10*3/uL (ref 150–400)
RBC: 4.31 MIL/uL (ref 4.22–5.81)
RDW: 11.3 % — ABNORMAL LOW (ref 11.5–15.5)
WBC: 6.6 10*3/uL (ref 4.0–10.5)
nRBC: 0 % (ref 0.0–0.2)

## 2023-05-14 LAB — URINALYSIS, ROUTINE W REFLEX MICROSCOPIC

## 2023-05-14 LAB — URINALYSIS, MICROSCOPIC (REFLEX): RBC / HPF: >50 RBC/hpf (ref 0–5)

## 2023-05-14 LAB — LIPASE, BLOOD: Lipase: 27 U/L (ref 11–51)

## 2023-05-14 MED ORDER — SODIUM CHLORIDE 0.9 % IV BOLUS
1000.0000 mL | Freq: Once | INTRAVENOUS | Status: AC
  Administered 2023-05-14: 1000 mL via INTRAVENOUS

## 2023-05-14 MED ORDER — HYDROMORPHONE HCL 1 MG/ML IJ SOLN
1.5000 mg | Freq: Once | INTRAMUSCULAR | Status: AC
  Administered 2023-05-14: 1.5 mg via INTRAVENOUS
  Filled 2023-05-14: qty 2

## 2023-05-14 MED ORDER — HYDROMORPHONE HCL 1 MG/ML IJ SOLN
1.0000 mg | Freq: Once | INTRAMUSCULAR | Status: AC
  Administered 2023-05-14: 1 mg via INTRAVENOUS
  Filled 2023-05-14: qty 1

## 2023-05-14 MED ORDER — DIPHENHYDRAMINE HCL 50 MG/ML IJ SOLN
25.0000 mg | Freq: Once | INTRAMUSCULAR | Status: AC
  Administered 2023-05-14: 25 mg via INTRAVENOUS
  Filled 2023-05-14: qty 1

## 2023-05-14 MED ORDER — DIPHENHYDRAMINE HCL 50 MG/ML IJ SOLN
25.0000 mg | Freq: Once | INTRAMUSCULAR | Status: AC
Start: 2023-05-14 — End: 2023-05-14
  Administered 2023-05-14: 25 mg via INTRAVENOUS
  Filled 2023-05-14: qty 1

## 2023-05-14 MED ORDER — ONDANSETRON 4 MG PO TBDP
4.0000 mg | ORAL_TABLET | Freq: Once | ORAL | Status: AC
  Administered 2023-05-14: 4 mg via ORAL
  Filled 2023-05-14: qty 1

## 2023-05-14 NOTE — ED Triage Notes (Signed)
 Pt to er, pt states that he had some kidney stone removed last week, states that he has a stent in place that needs to be removed.  Pt states that he is here for pain and to have the stent removed.

## 2023-05-14 NOTE — ED Provider Triage Note (Signed)
 Emergency Medicine Provider Triage Evaluation Note  Devin Sanders , a 43 y.o. male  was evaluated in triage.  Pt complains of abdominal pain has stents and kidney stone. Reports he had similar thing and needed admission for pain control in the past.   Review of Systems  Positive: Abd pain Negative: Fever  Physical Exam  BP (!) 172/116 (BP Location: Left Arm)   Pulse (!) 105   Temp 98.1 F (36.7 C) (Oral)   Resp 20   Ht 5\' 9"  (1.753 m)   Wt 102.1 kg   SpO2 96%   BMI 33.23 kg/m  Gen:   Awake, no distress   Resp:  Normal effort  MSK:   Moves extremities without difficulty  Other:    Medical Decision Making  Medically screening exam initiated at 4:40 PM.  Appropriate orders placed.  Deatrice Factor was informed that the remainder of the evaluation will be completed by another provider, this initial triage assessment does not replace that evaluation, and the importance of remaining in the ED until their evaluation is complete.     Eudora Heron, [place] 05/14/23 1610

## 2023-05-14 NOTE — ED Provider Notes (Signed)
 [place] EMERGENCY DEPARTMENT AT [facility] Provider Note   CSN: [id number] Arrival date & time: 05/14/23  1603     History  Chief Complaint  Patient presents with   Dysuria    Devin Sanders is a 43 y.o. male.  With a history of recurrent kidney stones status post multiple renal stones presenting for flank pain.  Recent cystoscopy/ureteroscopy with stent placement from Dr. Lorri Rota on May 2.  He was scheduled to remove his stent at home today but is having significant flank discomfort and is apprehensive about doing so.  Persistent hematuria.  No fevers chills but did have some nausea and vomiting secondary to pain.   Dysuria Presenting symptoms: dysuria        Home Medications Prior to Admission medications   Medication Sig Start Date End Date Taking? Authorizing Provider  cephALEXin  (KEFLEX ) 500 MG capsule Take 1 capsule (500 mg total) by mouth 2 (two) times daily. X 6 days to prevent infection with tethered stents. 05/08/23   Melody Spurling., MD  ketorolac  (TORADOL ) 10 MG tablet Take 1 tablet (10 mg total) by mouth every 8 (eight) hours as needed for moderate pain (pain score 4-6) (or stent discomfort post-operatively). 05/08/23   Melody Spurling., MD  ondansetron  (ZOFRAN ) 4 MG tablet Take 1 tablet (4 mg total) by mouth every 8 (eight) hours as needed for nausea or vomiting. Patient not taking: Reported on 05/01/2023 04/04/23   Tonya Fredrickson, MD  ondansetron  (ZOFRAN -ODT) 4 MG disintegrating tablet Take 1 tablet (4 mg total) by mouth every 8 (eight) hours as needed for nausea or vomiting. Patient not taking: Reported on 05/01/2023 08/31/22   Sofia, Leslie K, PA-C  oxybutynin  (DITROPAN ) 5 MG tablet Take 1 tablet (5 mg total) by mouth every 8 (eight) hours as needed for bladder spasms. Patient not taking: Reported on 05/01/2023 10/03/22   Armenta Landau, MD  oxyCODONE -acetaminophen  (PERCOCET /ROXICET) 5-325 MG tablet Take 1 tablet by mouth every 6 (six)  hours as needed for severe pain (pain score 7-10). Post-operatively 05/08/23   Melody Spurling., MD  polyethylene glycol (MIRALAX  / GLYCOLAX ) 17 g packet Take 17 g by mouth daily as needed for mild constipation. Patient not taking: Reported on 05/01/2023 10/03/22   Armenta Landau, MD  senna-docusate (SENOKOT-S) 8.6-50 MG tablet Take 1 tablet by mouth 2 (two) times daily. 10/03/22   Armenta Landau, MD  tamsulosin  (FLOMAX ) 0.4 MG CAPS capsule Take 1 capsule (0.4 mg total) by mouth daily. To help with stent colic 05/08/23   Secundino Dach Harvey Linen., MD      Allergies    Morphine  and Other    Review of Systems   Review of Systems  Genitourinary:  Positive for dysuria.    Physical Exam Updated Vital Signs BP (!) 152/105   Pulse 85   Temp 98 F (36.7 C)   Resp 19   Ht 5\' 9"  (1.753 m)   Wt 102.1 kg   SpO2 93%   BMI 33.23 kg/m  Physical Exam Vitals and nursing note reviewed.  HENT:     Head: Normocephalic and atraumatic.  Eyes:     Pupils: Pupils are equal, round, and reactive to light.  Cardiovascular:     Rate and Rhythm: Normal rate and regular rhythm.  Pulmonary:     Effort: Pulmonary effort is normal.     Breath sounds: Normal breath sounds.  Abdominal:     Palpations: Abdomen is soft.  Tenderness: There is abdominal tenderness.     Comments: Bilateral suprapubic tenderness  Skin:    General: Skin is warm and dry.  Neurological:     Mental Status: He is alert.  Psychiatric:        Mood and Affect: Mood normal.     ED Results / Procedures / Treatments   Labs (all labs ordered are listed, but only abnormal results are displayed) Labs Reviewed  COMPREHENSIVE METABOLIC PANEL WITH GFR - Abnormal; Notable for the following components:      Result Value   Sodium 133 (*)    All other components within normal limits  CBC WITH DIFFERENTIAL/PLATELET - Abnormal; Notable for the following components:   RDW 11.3 (*)    All other components within normal limits   URINALYSIS, ROUTINE W REFLEX MICROSCOPIC - Abnormal; Notable for the following components:   Color, Urine RED (*)    APPearance TURBID (*)    Glucose, UA   (*)    Value: TEST NOT REPORTED DUE TO COLOR INTERFERENCE OF URINE PIGMENT   Hgb urine dipstick   (*)    Value: TEST NOT REPORTED DUE TO COLOR INTERFERENCE OF URINE PIGMENT   Bilirubin Urine   (*)    Value: TEST NOT REPORTED DUE TO COLOR INTERFERENCE OF URINE PIGMENT   Ketones, ur   (*)    Value: TEST NOT REPORTED DUE TO COLOR INTERFERENCE OF URINE PIGMENT   Protein, ur   (*)    Value: TEST NOT REPORTED DUE TO COLOR INTERFERENCE OF URINE PIGMENT   Nitrite   (*)    Value: TEST NOT REPORTED DUE TO COLOR INTERFERENCE OF URINE PIGMENT   Leukocytes,Ua   (*)    Value: TEST NOT REPORTED DUE TO COLOR INTERFERENCE OF URINE PIGMENT   All other components within normal limits  URINALYSIS, MICROSCOPIC (REFLEX) - Abnormal; Notable for the following components:   Bacteria, UA RARE (*)    All other components within normal limits  LIPASE, BLOOD    EKG None  Radiology No results found.  Procedures Procedures    Medications Ordered in ED Medications  ondansetron  (ZOFRAN -ODT) disintegrating tablet 4 mg (4 mg Oral Given 05/14/23 1738)  sodium chloride  0.9 % bolus 1,000 mL (0 mLs Intravenous Stopped 05/14/23 2059)  HYDROmorphone  (DILAUDID ) injection 1 mg (1 mg Intravenous Given 05/14/23 1836)  diphenhydrAMINE  (BENADRYL ) injection 25 mg (25 mg Intravenous Given 05/14/23 1836)  HYDROmorphone  (DILAUDID ) injection 1 mg (1 mg Intravenous Given 05/14/23 2059)  HYDROmorphone  (DILAUDID ) injection 1.5 mg (1.5 mg Intravenous Given 05/14/23 2349)  diphenhydrAMINE  (BENADRYL ) injection 25 mg (25 mg Intravenous Given 05/14/23 2349)    ED Course/ Medical Decision Making/ A&P Clinical Course as of 05/15/23 0046  Thu May 14, 2023  2030 No significant white count or renal impairment.  Devin Sanders hematuria persists.  Discussed with Dr. Derrick Fling (urology) who does not  advise on removal of the ureteral stent at this time.  If we can control his pain he would appropriate for office follow-up tomorrow [MP]  2330 Unfortunately patient is still having significant amount of discomfort after 3 doses of Dilaudid .  He will need to be admitted for further pain control and perhaps urology evaluation tomorrow morning while he is still here. [MP]    Clinical Course User Index [MP] Dracen, Fluharty, DO  Medical Decision Making 43 year old male with history as above presenting for recurrent flank pain.  Multiple kidney stones requiring stent placement with last here with Dr. Pinkey Brier on May 2.  Did not want to remove the stent at home today given persistent pain.  Some nausea and vomiting secondary to pain.  Unable to keep p.o. pain meds down at home.  No fevers or chills.  Will obtain laboratory workup including UA give IV fluids analgesia and reach out to urology for further recommendation  Risk Prescription drug management.           Final Clinical Impression(s) / ED Diagnoses Final diagnoses:  Ureteral stent present  Kidney stones  Hematuria, unspecified type    Rx / DC Orders ED Discharge Orders     None         Naszir, Rigano, DO 05/15/23 6045

## 2023-05-15 ENCOUNTER — Encounter (HOSPITAL_COMMUNITY): Payer: Self-pay | Admitting: Internal Medicine

## 2023-05-15 DIAGNOSIS — N200 Calculus of kidney: Secondary | ICD-10-CM

## 2023-05-15 DIAGNOSIS — E871 Hypo-osmolality and hyponatremia: Secondary | ICD-10-CM | POA: Diagnosis not present

## 2023-05-15 DIAGNOSIS — R109 Unspecified abdominal pain: Secondary | ICD-10-CM

## 2023-05-15 DIAGNOSIS — Z960 Presence of urogenital implants: Secondary | ICD-10-CM | POA: Diagnosis not present

## 2023-05-15 LAB — CBC
HCT: 40.2 % (ref 39.0–52.0)
Hemoglobin: 13.6 g/dL (ref 13.0–17.0)
MCH: 33.6 pg (ref 26.0–34.0)
MCHC: 33.8 g/dL (ref 30.0–36.0)
MCV: 99.3 fL (ref 80.0–100.0)
Platelets: 245 10*3/uL (ref 150–400)
RBC: 4.05 MIL/uL — ABNORMAL LOW (ref 4.22–5.81)
RDW: 11.4 % — ABNORMAL LOW (ref 11.5–15.5)
WBC: 7.6 10*3/uL (ref 4.0–10.5)
nRBC: 0 % (ref 0.0–0.2)

## 2023-05-15 LAB — BASIC METABOLIC PANEL WITH GFR
Anion gap: 8 (ref 5–15)
BUN: 17 mg/dL (ref 6–20)
CO2: 28 mmol/L (ref 22–32)
Calcium: 9.1 mg/dL (ref 8.9–10.3)
Chloride: 101 mmol/L (ref 98–111)
Creatinine, Ser: 1.01 mg/dL (ref 0.61–1.24)
GFR, Estimated: >60 mL/min (ref >60)
Glucose, Bld: 114 mg/dL — ABNORMAL HIGH (ref 70–99)
Potassium: 4.7 mmol/L (ref 3.5–5.1)
Sodium: 137 mmol/L (ref 135–145)

## 2023-05-15 MED ORDER — ONDANSETRON HCL 4 MG PO TABS: 4.0000 mg | ORAL_TABLET | Freq: Four times a day (QID) | ORAL | Status: DC | PRN

## 2023-05-15 MED ORDER — FENTANYL CITRATE PF 50 MCG/ML IJ SOSY
12.5000 ug | PREFILLED_SYRINGE | INTRAMUSCULAR | Status: DC | PRN
  Administered 2023-05-15 (×3): 12.5 ug via INTRAVENOUS
  Filled 2023-05-15 (×3): qty 1

## 2023-05-15 MED ORDER — TAMSULOSIN HCL 0.4 MG PO CAPS
0.4000 mg | ORAL_CAPSULE | Freq: Every day | ORAL | Status: DC
  Administered 2023-05-15: 0.4 mg via ORAL
  Filled 2023-05-15: qty 1

## 2023-05-15 MED ORDER — SODIUM CHLORIDE 0.9% FLUSH
3.0000 mL | Freq: Two times a day (BID) | INTRAVENOUS | Status: DC
  Administered 2023-05-15 (×2): 3 mL via INTRAVENOUS

## 2023-05-15 MED ORDER — SODIUM CHLORIDE 0.9 % IV SOLN: 250.0000 mL | INTRAVENOUS | Status: DC | PRN

## 2023-05-15 MED ORDER — HYDRALAZINE HCL 20 MG/ML IJ SOLN: 10.0000 mg | Freq: Three times a day (TID) | INTRAMUSCULAR | Status: DC | PRN

## 2023-05-15 MED ORDER — SODIUM CHLORIDE 0.9% FLUSH: 3.0000 mL | INTRAVENOUS | Status: DC | PRN

## 2023-05-15 MED ORDER — HYOSCYAMINE SULFATE 0.125 MG SL SUBL
0.1250 mg | SUBLINGUAL_TABLET | SUBLINGUAL | Status: DC | PRN
  Administered 2023-05-15: 0.125 mg via SUBLINGUAL
  Filled 2023-05-15 (×2): qty 1

## 2023-05-15 MED ORDER — OXYCODONE HCL 5 MG PO TABS: 5.0000 mg | ORAL_TABLET | ORAL | 0 refills | Status: AC | PRN

## 2023-05-15 MED ORDER — HYOSCYAMINE SULFATE 0.125 MG SL SUBL: 0.1250 mg | SUBLINGUAL_TABLET | SUBLINGUAL | 0 refills | Status: DC | PRN

## 2023-05-15 MED ORDER — OXYCODONE HCL 5 MG PO TABS
5.0000 mg | ORAL_TABLET | ORAL | Status: DC | PRN
  Administered 2023-05-15 (×2): 5 mg via ORAL
  Filled 2023-05-15 (×3): qty 1

## 2023-05-15 MED ORDER — ACETAMINOPHEN 325 MG PO TABS
650.0000 mg | ORAL_TABLET | Freq: Four times a day (QID) | ORAL | Status: DC | PRN
  Administered 2023-05-15: 650 mg via ORAL
  Filled 2023-05-15: qty 2

## 2023-05-15 MED ORDER — ONDANSETRON HCL 4 MG/2ML IJ SOLN
4.0000 mg | Freq: Four times a day (QID) | INTRAMUSCULAR | Status: DC | PRN
Start: 2023-05-15 — End: 2023-05-15
  Administered 2023-05-15: 4 mg via INTRAVENOUS
  Filled 2023-05-15: qty 2

## 2023-05-15 MED ORDER — PROCHLORPERAZINE EDISYLATE 10 MG/2ML IJ SOLN
10.0000 mg | Freq: Four times a day (QID) | INTRAMUSCULAR | Status: DC | PRN
  Administered 2023-05-15: 10 mg via INTRAVENOUS
  Filled 2023-05-15: qty 2

## 2023-05-15 MED ORDER — ACETAMINOPHEN 650 MG RE SUPP: 650.0000 mg | Freq: Four times a day (QID) | RECTAL | Status: DC | PRN

## 2023-05-15 MED ORDER — SODIUM CHLORIDE 0.9% FLUSH
3.0000 mL | Freq: Two times a day (BID) | INTRAVENOUS | Status: DC
  Administered 2023-05-15: 3 mL via INTRAVENOUS

## 2023-05-15 NOTE — Plan of Care (Signed)
 The patient is a 43 year old male having undergone bilateral ureteroscopy with laser lithotripsy with Dr. Secundino Dach on 05/08/2023.  Patient was instructed to remove bilateral tethered stents on 05/14/2023, yesterday.  He had ongoing flank pain and presented to the emergency department for which he was admitted for pain management.  He expressed understanding of the directions when I met him but did not want to take them out himself.  I removed both tethered stents at the bedside in their entirety.  I have provided him with some as needed hyoscyamine for spasm.  Please discharge him home with a days worth  of this.  He reports that he has enough pain medication left after his procedure to cover him. If patient is pain-free in 2 hours, he is amenable to discharging home.  Case and plan discussed with Dr. Parke Boll and primary team.

## 2023-05-15 NOTE — TOC Initial Note (Signed)
 Transition of Care [facility]) - Initial/Assessment Note    Patient Details  Name: Devin Sanders MRN: [id number] Date of Birth: 10-28-1980  [facility] of [facility]) CM/SW Contact:    Levie Ream, RN Phone Number: 05/15/2023, 12:33 PM  Clinical Narrative:                 No PCP listed; spoke w/ pt in room; pt says he lives w/ his spouse Johanny Berkey [phone]); he plans to return at d/c; pt says she will provide transportation; pt verified insurance; he says his PCP is at [facility]; he denied SDOH risks; pt says he does not have DME, HH services, or home oxygen ; no TOC needs; TOC is signing off; please place consult if needed.  Expected Discharge Plan: Home/Self Care Barriers to Discharge: Continued Medical Work up   Patient Goals and CMS Choice Patient states their goals for this hospitalization and ongoing recovery are:: home          Expected Discharge Plan and Services   Discharge Planning Services: CM Consult   Living arrangements for the past 2 months: Single Family Home                                      Prior Living Arrangements/Services Living arrangements for the past 2 months: Single Family Home Lives with:: Spouse Patient language and need for interpreter reviewed:: Yes Do you feel safe going back to the place where you live?: Yes      Need for Family Participation in Patient Care: Yes (Comment) Care giver support system in place?: Yes (comment) Current home services:  (n/a) Criminal Activity/Legal Involvement Pertinent to Current Situation/Hospitalization: No - Comment as needed  Activities of Daily Living   ADL Screening (condition at time of admission) Independently performs ADLs?: Yes (appropriate for developmental age) Is the patient deaf or have difficulty hearing?: No Does the patient have difficulty seeing, even when wearing glasses/contacts?: No Does the patient have difficulty concentrating,  remembering, or making decisions?: No  Permission Sought/Granted Permission sought to share information with : Case Manager Permission granted to share information with : Yes, Verbal Permission Granted  Share Information with NAME: Case Manager     Permission granted to share info w Relationship: Jago Buffkin (spouse) [phone]     Emotional Assessment Appearance:: Appears stated age Attitude/Demeanor/Rapport: Gracious Affect (typically observed): Accepting Orientation: : Oriented to Self, Oriented to Place, Oriented to  Time, Oriented to Situation Alcohol / Substance Use: Not Applicable Psych Involvement: No (comment)  Admission diagnosis:  Kidney stones [N20.0] Flank pain [R10.9] Hematuria, unspecified type [R31.9] Ureteral stent present [Z96.0] Patient Active Problem List   Diagnosis Date Noted   Bilateral ureteral stent present 05/15/2023   Hyponatremia 05/15/2023   Bilateral nephrolithiasis 10/01/2022   Uncontrolled pain 10/01/2022   Hematuria 09/29/2022   UTI (urinary tract infection) 09/29/2022   Hydronephrosis 02/15/2022   Renal colic on right side 02/15/2022   Flank pain 10/03/2021   Ureteral colic 09/30/2021   Pain 02/18/2021   Renal stone 02/15/2021   Acute urinary retention 10/15/2020   Ureteral stone with hydronephrosis 08/08/2020   Hydronephrosis of right kidney 09/28/2017   PCP:  Pcp, No Pharmacy:   [id number] - [place], [place] - [address] [place] [address] Phone: [phone] Fax: [phone]  [place] [facility] [address]  [place] [address] Phone: [phone] Fax: [phone]  [id number] - [place], [place] - [address] AT [facility] [address] [place] [address] Phone: [phone] Fax: [phone]     Social Drivers of Health (SDOH) Social History: SDOH Screenings   Food Insecurity: No Food  Insecurity (05/15/2023)  Housing: Low Risk  (05/15/2023)  Transportation Needs: No Transportation Needs (05/15/2023)  Utilities: Not At Risk (05/15/2023)  Tobacco Use: Low Risk  (05/15/2023)   SDOH Interventions: Food Insecurity Interventions: Intervention Not Indicated, Inpatient TOC Housing Interventions: Intervention Not Indicated, Inpatient TOC Transportation Interventions: Intervention Not Indicated, Inpatient TOC Utilities Interventions: Intervention Not Indicated, Inpatient TOC   Readmission Risk Interventions     No data to display

## 2023-05-15 NOTE — Plan of Care (Signed)

## 2023-05-15 NOTE — H&P (Signed)
 History and Physical    Devin Sanders ZOX:[id number] DOB: 1980-10-07 DOA: 05/14/2023  PCP: Pcp, No   Patient coming from: Home   Chief Complaint:  Chief Complaint  Patient presents with   Dysuria   ED TRIAGE note:Pt to er, pt states that he had some kidney stone removed last week, states that he has a stent in place that needs to be removed. Pt states that he is here for pain and to have the stent removed.   HPI:  Devin Sanders is a 43 y.o. male with medical history significant of nephrolithiasis status post laser lithotripsy with bilateral ureteral stents 05/08/2023 and morbid obesity presents emergency department complaining about flank pain.  Patient reported that he has been scheduled for remove the stent today however having significant flank discomfort and pain with persistent hematuria so he has been presented to ED for evaluation.  Reported persistent hematuria and blood clot with urination.  He is complaining about bilateral flank discomfort and abdominal pain. Patient is denying any fever, chill, increasing urinary urgency, frequency and hesitancy.  No other complaint at this time.   At presentation to ED patient is hypertensive blood pressure 172/116 and tachycardic otherwise hemodynamically stable. UA showing presence of bacteria, elevated RBC and WBC count.  Unable to check the leukocyte esterase and nitrate. CBC unremarkable. CMP unremarkable except low sodium 133.  ED physician discussed case with Dr. Derrick Fling (urology) who does not advise on removal of the ureteral stent at this time. If we can control his pain he would appropriate for office follow-up tomorrow.  While in the ED patient continues to have significant abdominal discomfort even 3 doses of Dilaudid .  Hospitalist has been consulted for further evaluation admission for pain management and need urology reevaluation again in the daytime to decide to remove the ureteral stent while patient is here in the  hospital.   Significant labs in the ED: Lab Orders         Comprehensive metabolic panel         Lipase, blood         CBC with Diff         Urinalysis, Routine w reflex microscopic -Urine, Clean Catch         Urinalysis, Microscopic (reflex)         Basic metabolic panel         CBC       Review of Systems:  Review of Systems  Constitutional:  Negative for chills, fever, malaise/fatigue and weight loss.  Cardiovascular:  Negative for chest pain and palpitations.  Gastrointestinal:  Negative for abdominal pain, blood in stool, constipation, diarrhea and vomiting.  Genitourinary:  Positive for flank pain and hematuria. Negative for dysuria, frequency and urgency.  Musculoskeletal:  Negative for joint pain.  Neurological:  Negative for dizziness and headaches.  Psychiatric/Behavioral:  The patient is not nervous/anxious.     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)   GERD (gastroesophageal reflux disease)    History of kidney stones    Left ureteral calculus    Renal calculus, left    Wears glasses     Past Surgical History:  Procedure Laterality Date   CYSTOSCOPY W/ RETROGRADES Bilateral 05/08/2023   Procedure: CYSTOSCOPY, WITH RETROGRADE PYELOGRAM;  Surgeon: Melody Spurling., MD;  Location: [facility];  Service: Urology;  Laterality: Bilateral;  CYSTOSCOPY WITH RETROGRADE PYELOGRAM, URETEROSCOPY AND STENT PLACEMENT Bilateral 06/26/2017   Procedure: CYSTOSCOPY WITH RETROGRADE PYELOGRAM, URETEROSCOPY, STONE BASKETRY  AND STENT PLACEMENT;  Surgeon: Osborn Blaze, MD;  Location: [facility];  Service: Urology;  Laterality: Bilateral;   CYSTOSCOPY WITH RETROGRADE PYELOGRAM, URETEROSCOPY AND STENT PLACEMENT Right 09/18/2017   Procedure: CYSTOSCOPY WITH RETROGRADE PYELOGRAM, URETEROSCOPY AND STENT PLACEMENT;  Surgeon: Andrez Banker, MD;  Location: [facility];   Service: Urology;  Laterality: Right;   CYSTOSCOPY WITH RETROGRADE PYELOGRAM, URETEROSCOPY AND STENT PLACEMENT Bilateral 10/13/2017   Procedure: CYSTOSCOPY WITH RETROGRADE PYELOGRAM, BILATERAL URETEROSCOPY AND BILATERAL STENT PLACEMENT, LASER;  Surgeon: Osborn Blaze, MD;  Location: [facility];  Service: Urology;  Laterality: Bilateral;  90 MINS   CYSTOSCOPY WITH RETROGRADE PYELOGRAM, URETEROSCOPY AND STENT PLACEMENT Bilateral 03/24/2018   Procedure: CYSTOSCOPY WITH RETROGRADE PYELOGRAM, URETEROSCOPY AND STENT PLACEMENT;  Surgeon: Osborn Blaze, MD;  Location: [facility];  Service: Urology;  Laterality: Bilateral;  1 HR   CYSTOSCOPY WITH RETROGRADE PYELOGRAM, URETEROSCOPY AND STENT PLACEMENT Bilateral 07/07/2018   Procedure: CYSTOSCOPY WITH RETROGRADE PYELOGRAM, URETEROSCOPY AND STENT PLACEMENT;  Surgeon: Osborn Blaze, MD;  Location: [facility];  Service: Urology;  Laterality: Bilateral;  75 MINUTES   CYSTOSCOPY WITH RETROGRADE PYELOGRAM, URETEROSCOPY AND STENT PLACEMENT Left 01/12/2019   Procedure: CYSTOSCOPY WITH RETROGRADE PYELOGRAM, URETEROSCOPY AND STENT PLACEMENT;  Surgeon: Osborn Blaze, MD;  Location: [facility];  Service: Urology;  Laterality: Left;  90 MINS   CYSTOSCOPY WITH RETROGRADE PYELOGRAM, URETEROSCOPY AND STENT PLACEMENT Left 01/28/2019   Procedure: CYSTOSCOPY WITH RETROGRADE PYELOGRAM, URETEROSCOPY AND STENT EXCHANGE STONE BASKET EXTRACTION ;  Surgeon: Osborn Blaze, MD;  Location: [facility];  Service: Urology;  Laterality: Left;   CYSTOSCOPY WITH RETROGRADE PYELOGRAM, URETEROSCOPY AND STENT PLACEMENT Bilateral 05/13/2019   Procedure: CYSTOSCOPY WITH BILATERAL  RETROGRADE PYELOGRAM, LEFT URETEROSCOPY WITH HOLMIUM LASER AND STENT PLACEMENT;  Surgeon: Osborn Blaze, MD;  Location[facility];  Service: Urology;  Laterality: Bilateral;   CYSTOSCOPY WITH RETROGRADE PYELOGRAM, URETEROSCOPY AND STENT PLACEMENT Bilateral 11/02/2019   Procedure: CYSTOSCOPY WITH RETROGRADE PYELOGRAM,  URETEROSCOPY AND STENT PLACEMENT;  Surgeon: Osborn Blaze, MD;  Location: [facility];  Service: Urology;  Laterality: Bilateral;  90 MINS   CYSTOSCOPY WITH RETROGRADE PYELOGRAM, URETEROSCOPY AND STENT PLACEMENT Bilateral 10/12/2020   Procedure: CYSTOSCOPY WITH RETROGRADE PYELOGRAM, URETEROSCOPY, BASKETING OF STONES AND BILATERAL STENT PLACEMENTS;  Surgeon: Osborn Blaze, MD;  Location[facility];  Service: Urology;  Laterality: Bilateral;  75 MINS   CYSTOSCOPY WITH RETROGRADE PYELOGRAM, URETEROSCOPY AND STENT PLACEMENT Bilateral 02/15/2021   Procedure: CYSTOSCOPY WITH RETROGRADE PYELOGRAM, URETEROSCOPY AND STENT PLACEMENT;  Surgeon: Osborn Blaze, MD;  Location[facility];  Service: Urology;  Laterality: Bilateral;  75 MINS   CYSTOSCOPY WITH RETROGRADE PYELOGRAM, URETEROSCOPY AND STENT PLACEMENT Bilateral 09/27/2021   Procedure: CYSTOSCOPY WITH RETROGRADE PYELOGRAM, URETEROSCOPY AND STENT PLACEMENT, VASECTOMY;  Surgeon: Osborn Blaze, MD;  Location[facility];  Service: Urology;  Laterality: Bilateral;  75 MINS   CYSTOSCOPY WITH RETROGRADE PYELOGRAM, URETEROSCOPY AND STENT PLACEMENT Bilateral 02/12/2022   Procedure: CYSTOSCOPY WITH RETROGRADE PYELOGRAM, URETEROSCOPY AND STENT PLACEMENT;  Surgeon: Osborn Blaze, MD;  Location[facility];  Service: Urology;  Laterality: Bilateral;  75 MINS   CYSTOSCOPY/RETROGRADE/URETEROSCOPY/STONE EXTRACTION WITH BASKET Left 10/05/2017   Procedure: CYSTOSCOPY/RETROGRADE/STONE REMOVAL FROM BLADDER ;  Surgeon: Homero Luster, MD;  Location: [facility];  Service: Urology;  Laterality: Left;   CYSTOSCOPY/URETEROSCOPY/HOLMIUM LASER/STENT PLACEMENT Right 09/28/2017   Procedure: CYSTOSCOPY/RIGHT RETROGRADE URETEROSCOPY/HOLMIUM LASER/ RIGHT STENT PLACEMENT;  Surgeon: Sherrine Dolly,  Rhodia Cera, MD;  Location: [facility];  Service: Urology;  Laterality: Right;   CYSTOSCOPY/URETEROSCOPY/HOLMIUM LASER/STENT PLACEMENT Bilateral 01/27/2018   Procedure: CYSTOSCOPY/URETEROSCOPY/HOLMIUM  LASER/STENT PLACEMENT;  Surgeon: Osborn Blaze, MD;  Location: [facility];  Service: Urology;  Laterality: Bilateral;   CYSTOSCOPY/URETEROSCOPY/HOLMIUM LASER/STENT PLACEMENT Right 08/08/2020   Procedure: CYSTOSCOPY/URETEROSCOPY/RETROGRADE/ HOLMIUM LASER/STENT PLACEMENT;  Surgeon: Trent Frizzle, MD;  Location[facility];  Service: Urology;  Laterality: Right;   CYSTOSCOPY/URETEROSCOPY/HOLMIUM LASER/STENT PLACEMENT Bilateral 09/26/2022   Procedure: CYSTOSCOPY BILATERAL RETROGRADE URETEROSCOPY/HOLMIUM LASER/STENT PLACEMENT;  Surgeon: Melody Spurling., MD;  Location: [facility];  Service: Urology;  Laterality: Bilateral;   CYSTOSCOPY/URETEROSCOPY/HOLMIUM LASER/STENT PLACEMENT Bilateral 05/08/2023   Procedure: CYSTOSCOPY/URETEROSCOPY/HOLMIUM LASER/STENT PLACEMENT;  Surgeon: Melody Spurling., MD;  Location[facility];  Service: Urology;  Laterality: Bilateral;   HOLMIUM LASER APPLICATION Bilateral 06/26/2017   Procedure: HOLMIUM LASER APPLICATION;  Surgeon: Osborn Blaze, MD;  Location: [facility];  Service: Urology;  Laterality: Bilateral;   HOLMIUM LASER APPLICATION Right 09/18/2017   Procedure: HOLMIUM LASER APPLICATION;  Surgeon: Andrez Banker, MD;  Location[facility];  Service: Urology;  Laterality: Right;   HOLMIUM LASER APPLICATION Bilateral 10/13/2017   Procedure: HOLMIUM LASER APPLICATION;  Surgeon: Osborn Blaze, MD;  Location[facility];  Service: Urology;  Laterality: Bilateral;   HOLMIUM LASER APPLICATION Bilateral 01/27/2018   Procedure: HOLMIUM LASER APPLICATION;  Surgeon: Osborn Blaze, MD;  [facility];  Service: Urology;  Laterality: Bilateral;   HOLMIUM LASER APPLICATION Bilateral 03/24/2018   Procedure: HOLMIUM LASER APPLICATION;  Surgeon: Osborn Blaze, MD;  [facility];  Service: Urology;  Laterality: Bilateral;   HOLMIUM LASER APPLICATION Bilateral 07/07/2018   Procedure: HOLMIUM LASER  APPLICATION;  Surgeon: Osborn Blaze, MD;  Location[facility];  Service: Urology;  Laterality: Bilateral;   HOLMIUM LASER APPLICATION Left 01/12/2019   Procedure: HOLMIUM LASER APPLICATION;  Surgeon: Osborn Blaze, MD;  [facility];  Service: Urology;  Laterality: Left;   HOLMIUM LASER APPLICATION Bilateral 11/02/2019   Procedure: HOLMIUM LASER APPLICATION;  Surgeon: Osborn Blaze, MD;  Location: [facility];  Service: Urology;  Laterality: Bilateral;   HOLMIUM LASER APPLICATION Bilateral 02/15/2021   Procedure: HOLMIUM LASER APPLICATION;  Surgeon: Osborn Blaze, MD;  Location[facility];  Service: Urology;  Laterality: Bilateral;   HOLMIUM LASER APPLICATION Bilateral 09/27/2021   Procedure: HOLMIUM LASER APPLICATION;  Surgeon: Osborn Blaze, MD;  Location[facility];  Service: Urology;  Laterality: Bilateral;   HOLMIUM LASER APPLICATION Bilateral 02/12/2022   Procedure: HOLMIUM LASER APPLICATION;  Surgeon: Osborn Blaze, MD;  Location: [facility];  Service: Urology;  Laterality: Bilateral;   PERCUTANEOUS NEPHROSTOLITHOTOMY  2003;  2009;  06-22-2014; 01-30-2015  @ [facility]   URETEROSCOPIC STONE MANIPULATION UNILATERAL  multiple since 1997--  last one 02-19-2017  @ [facility]     reports that he has never smoked. He has never used smokeless tobacco. He reports that he does not drink alcohol and does not use drugs.  Allergies  Allergen Reactions   Morphine  Nausea And Vomiting   Other Hives and Swelling    Mangos    History reviewed. No pertinent family history.  Prior to Admission medications   Medication Sig Start Date End Date Taking? Authorizing Provider  cephALEXin  (KEFLEX ) 500 MG capsule Take 1 capsule (500 mg total) by mouth 2 (two) times daily. X 6 days to prevent infection with tethered stents. 05/08/23   Melody Spurling., MD  ketorolac  (TORADOL ) 10 MG  tablet Take 1 tablet (10 mg total) by mouth every 8 (eight) hours as needed for moderate pain (pain score 4-6) (or stent  discomfort post-operatively). 05/08/23   Melody Spurling., MD  ondansetron  (ZOFRAN ) 4 MG tablet Take 1 tablet (4 mg total) by mouth every 8 (eight) hours as needed for nausea or vomiting. Patient not taking: Reported on 05/01/2023 04/04/23   Tonya Fredrickson, MD  ondansetron  (ZOFRAN -ODT) 4 MG disintegrating tablet Take 1 tablet (4 mg total) by mouth every 8 (eight) hours as needed for nausea or vomiting. Patient not taking: Reported on 05/01/2023 08/31/22   Sofia, Leslie K, PA-C  oxybutynin  (DITROPAN ) 5 MG tablet Take 1 tablet (5 mg total) by mouth every 8 (eight) hours as needed for bladder spasms. Patient not taking: Reported on 05/01/2023 10/03/22   Armenta Landau, MD  oxyCODONE -acetaminophen  (PERCOCET Suzan Erm) 5-325 MG tablet Take 1 tablet by mouth every 6 (six) hours as needed for severe pain (pain score 7-10). Post-operatively 05/08/23   Melody Spurling., MD  polyethylene glycol (MIRALAX  / GLYCOLAX ) 17 g packet Take 17 g by mouth daily as needed for mild constipation. Patient not taking: Reported on 05/01/2023 10/03/22   Armenta Landau, MD  senna-docusate (SENOKOT-S) 8.6-50 MG tablet Take 1 tablet by mouth 2 (two) times daily. 10/03/22   Armenta Landau, MD  tamsulosin  (FLOMAX ) 0.4 MG CAPS capsule Take 1 capsule (0.4 mg total) by mouth daily. To help with stent colic 05/08/23   Melody Spurling., MD     Physical Exam: Vitals:   05/14/23 2330 05/15/23 0000 05/15/23 0011 05/15/23 0130  BP: (!) 154/102 (!) 152/105  (!) 140/100  Pulse: 85 85  85  Resp:  19  18  Temp:   98 F (36.7 C) 98.3 F (36.8 C)  TempSrc:    Oral  SpO2: 92% 93%  96%  Weight:      Height:        Physical Exam Vitals and nursing note reviewed.  Constitutional:      Appearance: Normal appearance. He is not ill-appearing.  HENT:     Nose: Nose normal.     Mouth/Throat:     Mouth: Mucous membranes are moist.  Eyes:     Pupils: Pupils are equal, round, and reactive to light.  Cardiovascular:      Rate and Rhythm: Normal rate and regular rhythm.     Pulses: Normal pulses.     Heart sounds: Normal heart sounds.  Pulmonary:     Effort: Pulmonary effort is normal.     Breath sounds: Normal breath sounds.  Abdominal:     General: There is no distension.     Palpations: Abdomen is soft.     Tenderness: There is no abdominal tenderness. There is right CVA tenderness and left CVA tenderness. There is no guarding or rebound.  Musculoskeletal:     Cervical back: Normal range of motion and neck supple.  Skin:    General: Skin is dry.     Capillary Refill: Capillary refill takes less than 2 seconds.  Neurological:     Mental Status: He is alert and oriented to person, place, and time.  Psychiatric:        Mood and Affect: Mood normal.      Labs on Admission: I have personally reviewed following labs and imaging studies  CBC: Recent Labs  Lab 05/14/23 1731  WBC 6.6  NEUTROABS 4.5  HGB 14.5  HCT 42.1  MCV  97.7  PLT 261   Basic Metabolic Panel: Recent Labs  Lab 05/14/23 1731  NA 133*  K 4.5  CL 99  CO2 25  GLUCOSE 96  BUN 18  CREATININE 1.10  CALCIUM  9.2   GFR: Estimated Creatinine Clearance: 102 mL/min (by C-G formula based on SCr of 1.1 mg/dL). Liver Function Tests: Recent Labs  Lab 05/14/23 1731  AST 29  ALT 31  ALKPHOS 61  BILITOT 0.9  PROT 7.3  ALBUMIN 4.3   Recent Labs  Lab 05/14/23 1731  LIPASE 27   No results for input(s): "AMMONIA" in the last 168 hours. Coagulation Profile: No results for input(s): "INR", "PROTIME" in the last 168 hours. Cardiac Enzymes: No results for input(s): "CKTOTAL", "CKMB", "CKMBINDEX", "TROPONINI", "TROPONINIHS" in the last 168 hours. BNP (last 3 results) No results for input(s): "BNP" in the last 8760 hours. HbA1C: No results for input(s): "HGBA1C" in the last 72 hours. CBG: No results for input(s): "GLUCAP" in the last 168 hours. Lipid Profile: No results for input(s): "CHOL", "HDL", "LDLCALC", "TRIG",  "CHOLHDL", "LDLDIRECT" in the last 72 hours. Thyroid Function Tests: No results for input(s): "TSH", "T4TOTAL", "FREET4", "T3FREE", "THYROIDAB" in the last 72 hours. Anemia Panel: No results for input(s): "VITAMINB12", "FOLATE", "FERRITIN", "TIBC", "IRON", "RETICCTPCT" in the last 72 hours. Urine analysis:    Component Value Date/Time   COLORURINE RED (A) 05/14/2023 1731   APPEARANCEUR TURBID (A) 05/14/2023 1731   LABSPEC  05/14/2023 1731    TEST NOT REPORTED DUE TO COLOR INTERFERENCE OF URINE PIGMENT   PHURINE  05/14/2023 1731    TEST NOT REPORTED DUE TO COLOR INTERFERENCE OF URINE PIGMENT   GLUCOSEU (A) 05/14/2023 1731    TEST NOT REPORTED DUE TO COLOR INTERFERENCE OF URINE PIGMENT   HGBUR (A) 05/14/2023 1731    TEST NOT REPORTED DUE TO COLOR INTERFERENCE OF URINE PIGMENT   BILIRUBINUR (A) 05/14/2023 1731    TEST NOT REPORTED DUE TO COLOR INTERFERENCE OF URINE PIGMENT   KETONESUR (A) 05/14/2023 1731    TEST NOT REPORTED DUE TO COLOR INTERFERENCE OF URINE PIGMENT   PROTEINUR (A) 05/14/2023 1731    TEST NOT REPORTED DUE TO COLOR INTERFERENCE OF URINE PIGMENT   NITRITE (A) 05/14/2023 1731    TEST NOT REPORTED DUE TO COLOR INTERFERENCE OF URINE PIGMENT   LEUKOCYTESUR (A) 05/14/2023 1731    TEST NOT REPORTED DUE TO COLOR INTERFERENCE OF URINE PIGMENT    Radiological Exams on Admission: I have personally reviewed images No results found.    Assessment/Plan: Principal Problem:   Flank pain Active Problems:   Bilateral nephrolithiasis   Bilateral ureteral stent present   Hyponatremia    Assessment and Plan: Bilateral flank pain Bilateral nephrolithiasis s/p lithotripsy and bilateral stent placement 05/08/2023 -Patient presenting to emergency department bilateral flank pain abdominal pain.  Denies any fever, chill, and UTI symptoms - Patient recently underwent bilateral uteroscopy with laser lithotripsy and bilateral ureteral stents placement on 05/08/2023. -At presentation to  ED patient is hemodynamically stable. - CBC and CMP unremarkable except low sodium 133. -UA rare bacteria, RBC, WBC positive.  Due to hematuria unable to verify for leukocyte esterase and nitrate. - Patient reported he is supposed to be having removal of the bilateral stent today however due to extensive abdominal pain came to ED for evaluation. - ED physician discussed case with urology Dr.Eskridge who does not advise on removal of the ureteral stent at this time.  Recommended appropriate pain control and follow-up with urology  clinic tomorrow. -In the ED patient received Dilaudid  multiple doses without improvement of the abdominal pain. - Admitting patient for pain control. - Please reach out to urology in the daytime to decide remove the ureteral stent while patient is here in the hospital.  Hyponatremia Slight low sodium 133.  Normal bicarb level.  Continue to monitor.    DVT prophylaxis:  SCDs.  Avoid pharmacologic DVT prophylaxis in the setting of hematuria. Code Status:  Full Code Diet: Heart healthy diet Disposition Plan: Continue monitor improvement of pain and reach out to urology in the daytime for reevaluation. Consults: Urology Admission status:   Observation, Med-Surg  Severity of Illness: The appropriate patient status for this patient is OBSERVATION. Observation status is judged to be reasonable and necessary in order to provide the required intensity of service to ensure the patient's safety. The patient's presenting symptoms, physical exam findings, and initial radiographic and laboratory data in the context of their medical condition is felt to place them at decreased risk for further clinical deterioration. Furthermore, it is anticipated that the patient will be medically stable for discharge from the hospital within 2 midnights of admission.     Roseann Kees, MD [facility]  How to contact the [facility] Attending or Consulting provider 7A - 7P or covering provider  during after hours 7P -7A, for this patient.  Check the care team in [facility] and look for a) attending/consulting [facility] provider listed and b) the [facility] team listed Log into [email or web address] and use [place]'s universal password to access. If you do not have the password, please contact the hospital operator. Locate the [facility] provider you are looking for under [facility] and page to a number that you can be directly reached. If you still have difficulty reaching the provider, please page the [facility] (Director on Call) for the Hospitalists listed on amion for assistance.  05/15/2023, 3:14 AM

## 2023-05-15 NOTE — Care Plan (Signed)
 This 43 years old male with PMH significant for nephrolithiasis status post laser lithotripsy with bilateral ureteral stent placement on 05/08/2023 and morbid obesity presented in the ED with complaints of having persistent flank pain.  Patient reports that he has been scheduled to remove the stents today , however he has developed significant flank discomfort and pain with persistent hematuria so he decided to come to the ED.  EDP discussed the case with urologist Dr. Derrick Fling who does not advise on removal of the ureteral stent at this time.  Patient was admitted for further evaluation. Urology is consulted.  Still remains in a lot of pain.

## 2023-05-15 NOTE — Discharge Instructions (Signed)
Follow-up Urology as scheduled

## 2023-05-15 NOTE — Discharge Summary (Signed)
 Physician Discharge Summary  Devin Sanders WUJ:[id number] DOB: April 12, 1980 DOA: 05/14/2023  PCP: Pcp, No  Admit date: 05/14/2023  Discharge date: 05/15/2023  Admitted From: Home Disposition: Home  Recommendations for Outpatient Follow-up:  Follow up with PCP in 1-2 weeks Please obtain BMP/CBC in one week Advised to follow-up with urology as scheduled.  Home Health: None Equipment/Devices: None  Discharge Condition: Stable CODE STATUS:Full code Diet recommendation: Heart Healthy   Brief Summary / Hospital Course: This 43 years old male with PMH significant for nephrolithiasis status post laser lithotripsy with bilateral ureteral stent placement on 05/08/2023 and morbid obesity presented in the ED with complaints of having persistent flank pain.  Patient reports that he has been scheduled to remove the stents today , however he has developed significant flank discomfort and pain with persistent hematuria so he decided to come to the ED.  EDP discussed the case with urologist Dr. Derrick Fling who does not advise on removal of the ureteral stent at this time.  Patient was admitted for further evaluation. Urology was consulted.  Still remains in a lot of pain.  Stents were removed successfully at bedside.  Patient was also given hyoscyamine muscle relaxer.  Patient feels much better and wants to be discharged.  Discharge Diagnoses:  Principal Problem:   Flank pain Active Problems:   Bilateral nephrolithiasis   Bilateral ureteral stent present   Hyponatremia    Discharge Instructions  Discharge Instructions     Call MD for:  difficulty breathing, headache or visual disturbances   Complete by: As directed    Call MD for:  persistant dizziness or light-headedness   Complete by: As directed    Call MD for:  severe uncontrolled pain   Complete by: As directed    Diet - low sodium heart healthy   Complete by: As directed    Diet Carb Modified   Complete by: As directed    Discharge  instructions   Complete by: As directed    Advised to follow up urology as scheduled. Advised to take pain medications  as needed.   Increase activity slowly   Complete by: As directed       Allergies as of 05/15/2023       Reactions   Morphine  Nausea And Vomiting   Other Hives, Swelling   Mangos        Medication List     STOP taking these medications    cephALEXin  500 MG capsule Commonly known as: KEFLEX    ketorolac  10 MG tablet Commonly known as: TORADOL    ondansetron  4 MG disintegrating tablet Commonly known as: ZOFRAN -ODT   ondansetron  4 MG tablet Commonly known as: ZOFRAN    oxybutynin  5 MG tablet Commonly known as: DITROPAN    polyethylene glycol 17 g packet Commonly known as: MIRALAX  / GLYCOLAX    Stool Softener/Laxative 50-8.6 MG tablet Generic drug: senna-docusate       TAKE these medications    hyoscyamine 0.125 MG SL tablet Commonly known as: LEVSIN SL Place 1 tablet (0.125 mg total) under the tongue every 4 (four) hours as needed.   oxyCODONE  5 MG immediate release tablet Commonly known as: Oxy IR/ROXICODONE  Take 1 tablet (5 mg total) by mouth every 4 (four) hours as needed for up to 3 days for moderate pain (pain score 4-6).   oxyCODONE -acetaminophen  5-325 MG tablet Commonly known as: PERCOCET /ROXICET Take 1 tablet by mouth every 6 (six) hours as needed for severe pain (pain score 7-10). Post-operatively   tamsulosin  0.4 MG Caps  capsule Commonly known as: FLOMAX  Take 1 capsule (0.4 mg total) by mouth daily. To help with stent colic        Follow-up Information     [facility] Follow up in 1 week(s).   Contact information: [address] [place] [address] [place]  [address] [phone]               Allergies  Allergen Reactions   Morphine  Nausea And Vomiting   Other Hives and Swelling    Mangos    Consultations: Urology   Procedures/Studies: DG C-Arm 1-60 Min-No Report Result Date:  05/08/2023 Fluoroscopy was utilized by the requesting physician.  No radiographic interpretation.    Subjective: Patient was seen and examined at bedside.  Overnight events noted.  Patient reports doing much better and  wants to be discharged.  Discharge Exam: Vitals:   05/15/23 0527 05/15/23 1021  BP: 134/86 (!) 152/99  Pulse: 85 100  Resp: 18 18  Temp: 98.2 F (36.8 C) 97.9 F (36.6 C)  SpO2: 92% 95%   Vitals:   05/15/23 0011 05/15/23 0130 05/15/23 0527 05/15/23 1021  BP:  (!) 140/100 134/86 (!) 152/99  Pulse:  85 85 100  Resp:  18 18 18   Temp: 98 F (36.7 C) 98.3 F (36.8 C) 98.2 F (36.8 C) 97.9 F (36.6 C)  TempSrc:  Oral Oral   SpO2:  96% 92% 95%  Weight:      Height:        General: Pt is alert, awake, not in acute distress Cardiovascular: RRR, S1/S2 +, no rubs, no gallops Respiratory: CTA bilaterally, no wheezing, no rhonchi Abdominal: Soft, NT, ND, bowel sounds + Extremities: no edema, no cyanosis    The results of significant diagnostics from this hospitalization (including imaging, microbiology, ancillary and laboratory) are listed below for reference.     Microbiology: No results found for this or any previous visit (from the past 240 hours).   Labs: BNP (last 3 results) No results for input(s): "BNP" in the last 8760 hours. Basic Metabolic Panel: Recent Labs  Lab 05/14/23 1731 05/15/23 0322  NA 133* 137  K 4.5 4.7  CL 99 101  CO2 25 28  GLUCOSE 96 114*  BUN 18 17  CREATININE 1.10 1.01  CALCIUM  9.2 9.1   Liver Function Tests: Recent Labs  Lab 05/14/23 1731  AST 29  ALT 31  ALKPHOS 61  BILITOT 0.9  PROT 7.3  ALBUMIN 4.3   Recent Labs  Lab 05/14/23 1731  LIPASE 27   No results for input(s): "AMMONIA" in the last 168 hours. CBC: Recent Labs  Lab 05/14/23 1731 05/15/23 0322  WBC 6.6 7.6  NEUTROABS 4.5  --   HGB 14.5 13.6  HCT 42.1 40.2  MCV 97.7 99.3  PLT 261 245   Cardiac Enzymes: No results for input(s): "CKTOTAL",  "CKMB", "CKMBINDEX", "TROPONINI" in the last 168 hours. BNP: Invalid input(s): "POCBNP" CBG: No results for input(s): "GLUCAP" in the last 168 hours. D-Dimer No results for input(s): "DDIMER" in the last 72 hours. Hgb A1c No results for input(s): "HGBA1C" in the last 72 hours. Lipid Profile No results for input(s): "CHOL", "HDL", "LDLCALC", "TRIG", "CHOLHDL", "LDLDIRECT" in the last 72 hours. Thyroid function studies No results for input(s): "TSH", "T4TOTAL", "T3FREE", "THYROIDAB" in the last 72 hours.  Invalid input(s): "FREET3" Anemia work up No results for input(s): "VITAMINB12", "FOLATE", "FERRITIN", "TIBC", "IRON", "RETICCTPCT" in the last 72 hours. Urinalysis    Component Value  Date/Time   COLORURINE RED (A) 05/14/2023 1731   APPEARANCEUR TURBID (A) 05/14/2023 1731   LABSPEC  05/14/2023 1731    TEST NOT REPORTED DUE TO COLOR INTERFERENCE OF URINE PIGMENT   PHURINE  05/14/2023 1731    TEST NOT REPORTED DUE TO COLOR INTERFERENCE OF URINE PIGMENT   GLUCOSEU (A) 05/14/2023 1731    TEST NOT REPORTED DUE TO COLOR INTERFERENCE OF URINE PIGMENT   HGBUR (A) 05/14/2023 1731    TEST NOT REPORTED DUE TO COLOR INTERFERENCE OF URINE PIGMENT   BILIRUBINUR (A) 05/14/2023 1731    TEST NOT REPORTED DUE TO COLOR INTERFERENCE OF URINE PIGMENT   KETONESUR (A) 05/14/2023 1731    TEST NOT REPORTED DUE TO COLOR INTERFERENCE OF URINE PIGMENT   PROTEINUR (A) 05/14/2023 1731    TEST NOT REPORTED DUE TO COLOR INTERFERENCE OF URINE PIGMENT   NITRITE (A) 05/14/2023 1731    TEST NOT REPORTED DUE TO COLOR INTERFERENCE OF URINE PIGMENT   LEUKOCYTESUR (A) 05/14/2023 1731    TEST NOT REPORTED DUE TO COLOR INTERFERENCE OF URINE PIGMENT   Sepsis Labs Recent Labs  Lab 05/14/23 1731 05/15/23 0322  WBC 6.6 7.6   Microbiology No results found for this or any previous visit (from the past 240 hours).   Time coordinating discharge: Over 30 minutes  SIGNED:   Magdalene School, MD  [facility] 05/15/2023, 4:07 PM Pager   If 7PM-7AM, please contact night-coverage [email or web address]

## 2024-01-15 ENCOUNTER — Emergency Department (HOSPITAL_COMMUNITY)
Admission: EM | Admit: 2024-01-15 | Discharge: 2024-01-15 | Disposition: A | Discharge status: Home / Self Care | Attending: Emergency Medicine | Admitting: Emergency Medicine

## 2024-01-15 ENCOUNTER — Emergency Department (HOSPITAL_COMMUNITY)

## 2024-01-15 ENCOUNTER — Other Ambulatory Visit: Payer: Self-pay

## 2024-01-15 DIAGNOSIS — Z8616 Personal history of COVID-19: Secondary | ICD-10-CM | POA: Diagnosis not present

## 2024-01-15 DIAGNOSIS — R1032 Left lower quadrant pain: Secondary | ICD-10-CM | POA: Insufficient documentation

## 2024-01-15 DIAGNOSIS — R10A2 Flank pain, left side: Secondary | ICD-10-CM

## 2024-01-15 LAB — BASIC METABOLIC PANEL WITH GFR
Anion gap: 10 (ref 5–15)
BUN: 16 mg/dL (ref 6–20)
CO2: 26 mmol/L (ref 22–32)
Calcium: 10.2 mg/dL (ref 8.9–10.3)
Chloride: 103 mmol/L (ref 98–111)
Creatinine, Ser: 1.15 mg/dL (ref 0.61–1.24)
GFR, Estimated: >60 mL/min
Glucose, Bld: 91 mg/dL (ref 70–99)
Potassium: 4.5 mmol/L (ref 3.5–5.1)
Sodium: 139 mmol/L (ref 135–145)

## 2024-01-15 LAB — CBC WITH DIFFERENTIAL/PLATELET
Abs Immature Granulocytes: 0.01 K/uL (ref 0.00–0.07)
Basophils Absolute: 0 K/uL (ref 0.0–0.1)
Basophils Relative: 0 %
Eosinophils Absolute: 0.1 K/uL (ref 0.0–0.5)
Eosinophils Relative: 1 %
HCT: 43.8 % (ref 39.0–52.0)
Hemoglobin: 15.3 g/dL (ref 13.0–17.0)
Immature Granulocytes: 0 %
Lymphocytes Relative: 24 %
Lymphs Abs: 1.5 K/uL (ref 0.7–4.0)
MCH: 33.9 pg (ref 26.0–34.0)
MCHC: 34.9 g/dL (ref 30.0–36.0)
MCV: 97.1 fL (ref 80.0–100.0)
Monocytes Absolute: 0.4 K/uL (ref 0.1–1.0)
Monocytes Relative: 7 %
Neutro Abs: 4.1 K/uL (ref 1.7–7.7)
Neutrophils Relative %: 68 %
Platelets: 307 K/uL (ref 150–400)
RBC: 4.51 MIL/uL (ref 4.22–5.81)
RDW: 11.4 % — ABNORMAL LOW (ref 11.5–15.5)
WBC: 6 K/uL (ref 4.0–10.5)
nRBC: 0 % (ref 0.0–0.2)

## 2024-01-15 LAB — URINALYSIS, ROUTINE W REFLEX MICROSCOPIC
Bilirubin Urine: NEGATIVE
Glucose, UA: NEGATIVE mg/dL
Hgb urine dipstick: NEGATIVE
Ketones, ur: NEGATIVE mg/dL
Leukocytes,Ua: NEGATIVE
Nitrite: NEGATIVE
Protein, ur: NEGATIVE mg/dL
Specific Gravity, Urine: 1.025 (ref 1.005–1.030)
pH: 6 (ref 5.0–8.0)

## 2024-01-15 MED ORDER — SODIUM CHLORIDE 0.9 % IV BOLUS
1000.0000 mL | Freq: Once | INTRAVENOUS | Status: AC
  Administered 2024-01-15: 1000 mL via INTRAVENOUS

## 2024-01-15 MED ORDER — KETOROLAC TROMETHAMINE 15 MG/ML IJ SOLN
15.0000 mg | Freq: Once | INTRAMUSCULAR | Status: AC
  Administered 2024-01-15: 15 mg via INTRAVENOUS
  Filled 2024-01-15: qty 1

## 2024-01-15 MED ORDER — FENTANYL CITRATE (PF) 50 MCG/ML IJ SOSY
50.0000 ug | PREFILLED_SYRINGE | Freq: Once | INTRAMUSCULAR | Status: AC
  Administered 2024-01-15: 50 ug via INTRAVENOUS
  Filled 2024-01-15: qty 1

## 2024-01-15 MED ORDER — DIPHENHYDRAMINE HCL 50 MG/ML IJ SOLN
25.0000 mg | Freq: Once | INTRAMUSCULAR | Status: AC
  Administered 2024-01-15: 25 mg via INTRAVENOUS
  Filled 2024-01-15: qty 1

## 2024-01-15 MED ORDER — ONDANSETRON HCL 4 MG/2ML IJ SOLN
4.0000 mg | Freq: Once | INTRAMUSCULAR | Status: AC
  Administered 2024-01-15: 4 mg via INTRAVENOUS
  Filled 2024-01-15: qty 2

## 2024-01-15 NOTE — ED Provider Notes (Addendum)
 " [facility] [place] EMERGENCY DEPARTMENT AT [facility] Provider Note   CSN: [id number] Arrival date & time: 01/15/24  1511     Patient presents with: Flank Pain   Devin Sanders is a 44 y.o. male.   Patient is a 44 year old male who has a history of prior kidney stones.  He says he frequently is passing kidney stones.  He came in today because his pain was worse than his typical pain.  Its mostly on this left side with his left lower abdomen and left flank area.  He denies any urinary symptoms.  No fevers.  He has had some associated nausea and vomiting.  He did not have any pain medications to take at home.  He had a prior recent lithotripsy in May of last year with stent placement with subsequent removal of the stents.       Prior to Admission medications  Medication Sig Start Date End Date Taking? Authorizing Provider  hyoscyamine  (LEVSIN  SL) 0.125 MG SL tablet Place 1 tablet (0.125 mg total) under the tongue every 4 (four) hours as needed. 05/15/23   Leotis Bogus, MD  oxyCODONE -acetaminophen  (PERCOCET /ROXICET) 5-325 MG tablet Take 1 tablet by mouth every 6 (six) hours as needed for severe pain (pain score 7-10). Post-operatively 05/08/23   Alvaro Ricardo KATHEE Mickey., MD  tamsulosin  (FLOMAX ) 0.4 MG CAPS capsule Take 1 capsule (0.4 mg total) by mouth daily. To help with stent colic 05/08/23   Alvaro Ricardo KATHEE Mickey., MD    Allergies: Morphine  and Other    Review of Systems  Constitutional:  Negative for chills, diaphoresis, fatigue and fever.  HENT:  Negative for congestion, rhinorrhea and sneezing.   Eyes: Negative.   Respiratory:  Negative for cough, chest tightness and shortness of breath.   Cardiovascular:  Negative for chest pain and leg swelling.  Gastrointestinal:  Positive for abdominal pain. Negative for diarrhea, nausea and vomiting.  Genitourinary:  Positive for flank pain. Negative for difficulty urinating, frequency, hematuria and scrotal swelling.  Musculoskeletal:   Negative for arthralgias and back pain.  Skin:  Negative for rash.  Neurological:  Negative for dizziness and headaches.    Updated Vital Signs BP (!) 134/107 (BP Location: Left Arm)   Pulse 90   Temp 98.2 F (36.8 C) (Oral)   Resp 16   SpO2 97%   Physical Exam Constitutional:      Appearance: He is well-developed.  HENT:     Head: Normocephalic and atraumatic.  Eyes:     Pupils: Pupils are equal, round, and reactive to light.  Cardiovascular:     Rate and Rhythm: Normal rate and regular rhythm.     Heart sounds: Normal heart sounds.  Pulmonary:     Effort: Pulmonary effort is normal. No respiratory distress.     Breath sounds: Normal breath sounds. No wheezing or rales.  Chest:     Chest wall: No tenderness.  Abdominal:     General: Bowel sounds are normal.     Palpations: Abdomen is soft.     Tenderness: There is abdominal tenderness (Positive tenderness in the left mid and lower abdomen). There is no guarding or rebound.  Musculoskeletal:        General: Normal range of motion.     Cervical back: Normal range of motion and neck supple.  Lymphadenopathy:     Cervical: No cervical adenopathy.  Skin:    General: Skin is warm and dry.     Findings: No rash.  Neurological:     Mental Status: He is alert and oriented to person, place, and time.     (all labs ordered are listed, but only abnormal results are displayed) Labs Reviewed  CBC WITH DIFFERENTIAL/PLATELET - Abnormal; Notable for the following components:      Result Value   RDW 11.4 (*)    All other components within normal limits  URINALYSIS, ROUTINE W REFLEX MICROSCOPIC  BASIC METABOLIC PANEL WITH GFR    EKG: None  Radiology: CT Renal Stone Study Result Date: 01/15/2024 EXAM: CT ABDOMEN AND PELVIS WITHOUT CONTRAST 01/15/2024 06:01:06 PM TECHNIQUE: CT of the abdomen and pelvis was performed without the administration of intravenous contrast. Multiplanar reformatted images are provided for review.  Automated exposure control, iterative reconstruction, and/or weight-based adjustment of the mA/kV was utilized to reduce the radiation dose to as low as reasonably achievable. COMPARISON: CT abdomen and pelvis 04/04/2023. CLINICAL HISTORY: Abdominal/flank pain, stone suspected. FINDINGS: LOWER CHEST: No acute abnormality. LIVER: The liver is unremarkable. GALLBLADDER AND BILE DUCTS: Gallbladder is unremarkable. No biliary ductal dilatation. SPLEEN: No acute abnormality. PANCREAS: No acute abnormality. ADRENAL GLANDS: No acute abnormality. KIDNEYS, URETERS AND BLADDER: Bilateral renal calculi are present. The largest is in the left kidney measuring 7 mm. There is left renal cortical scarring. There is a cyst in the right kidney measuring 13 mm. Per consensus, no follow-up is needed for simple Bosniak type 1 and 2 renal cysts, unless the patient has a malignancy history or risk factors. No hydronephrosis. No perinephric or periureteral stranding. Urinary bladder is unremarkable. GI AND BOWEL: Stomach demonstrates no acute abnormality. The appendix appears normal. There is no bowel obstruction. PERITONEUM AND RETROPERITONEUM: No ascites. No free air. VASCULATURE: Aorta is normal in caliber. LYMPH NODES: No lymphadenopathy. REPRODUCTIVE ORGANS: No acute abnormality. BONES AND SOFT TISSUES: No acute osseous abnormality. There are small fat containing bilateral inguinal hernias. IMPRESSION: 1. Bilateral renal calculi, largest in the left kidney measuring 7 mm, with left renal cortical scarring. 2. Benign 13 mm right renal cyst. No follow-up imaging is recommended. 3. Small fat containing bilateral inguinal hernias. Electronically signed by: Greig Pique MD MD 01/15/2024 06:37 PM EST RP Workstation: [id number]     Procedures   Medications Ordered in the ED  fentaNYL  (SUBLIMAZE ) injection 50 mcg (50 mcg Intravenous Given 01/15/24 1815)  ondansetron  (ZOFRAN ) injection 4 mg (4 mg Intravenous Given 01/15/24 1817)   ketorolac  (TORADOL ) 15 MG/ML injection 15 mg (15 mg Intravenous Given 01/15/24 1816)  sodium chloride  0.9 % bolus 1,000 mL (1,000 mLs Intravenous New Bag/Given 01/15/24 1815)  diphenhydrAMINE  (BENADRYL ) injection 25 mg (25 mg Intravenous Given 01/15/24 1828)                                    Medical Decision Making Amount and/or Complexity of Data Reviewed Labs: ordered. Radiology: ordered.  Risk Prescription drug management.   This patient presents to the ED for concern of flank pain, this involves an extensive number of treatment options, and is a complaint that carries with it a high risk of complications and morbidity.  I considered the following differential and admission for this acute, potentially life threatening condition.  The differential diagnosis includes kidney stone, pyelonephritis, appendicitis, diverticulitis, bowel obstruction, testicular origin for the pain  MDM:    Patient is a 44 year old who has a prior history of kidney stones who says he has flank pain similar to his  prior kidney stone pain.  He received some pain medication and IV fluids.  He is overall feeling better.  He is not febrile.  There is no ongoing vomiting.  He does not have any pain in his inguinal areas or scrotum.  His urine was clean without signs of infection or hematuria.  Creatinine was normal.  WBC count was normal.  CT scan shows no acute abnormalities.  There are stones in his kidneys but no ureteral stones.  He was discharged home in good condition.  Was encouraged to follow-up with his urologist if his symptoms continue.  Return precautions were given.  (Labs, imaging, consults)  Labs: I Ordered, and personally interpreted labs.  The pertinent results include: Urine not concerning for infection, normal WBC count, normal creatinine  Imaging Studies ordered: I ordered imaging studies including CT abdomen pelvis I independently visualized and interpreted imaging. I agree with the radiologist  interpretation  Additional history obtained from  .  External records from outside source obtained and reviewed including prior notes  Cardiac Monitoring: The patient was not maintained on a cardiac monitor.  If on the cardiac monitor, I personally viewed and interpreted the cardiac monitored which showed an underlying rhythm of:    Reevaluation: After the interventions noted above, I reevaluated the patient and found that they have :improved  Social Determinants of Health:    Disposition: Discharged to home  Co morbidities that complicate the patient evaluation  Past Medical History:  Diagnosis Date   COVID 02/2018   asymptomatic was exposed   Cystinuria    genetic (condition make renal stones)   GERD (gastroesophageal reflux disease)    History of kidney stones    Left ureteral calculus    Renal calculus, left    Wears glasses      Medicines Meds ordered this encounter  Medications   fentaNYL  (SUBLIMAZE ) injection 50 mcg   ondansetron  (ZOFRAN ) injection 4 mg   ketorolac  (TORADOL ) 15 MG/ML injection 15 mg   sodium chloride  0.9 % bolus 1,000 mL   diphenhydrAMINE  (BENADRYL ) injection 25 mg    I have reviewed the patients home medicines and have made adjustments as needed  Problem List / ED Course: Problem List Items Addressed This Visit       Other   Flank pain - Primary             Final diagnoses:  Left flank pain    ED Discharge Orders     None          Lenor Hollering, MD 01/15/24 2010    Lenor Hollering, MD 01/15/24 2011  "

## 2024-01-15 NOTE — Discharge Instructions (Signed)
 Make an appointment to follow-up with your urologist if your symptoms continue.  Return to the emergency room if you have any worsening symptoms.

## 2024-01-15 NOTE — ED Triage Notes (Signed)
 Pt reports flank pain x 1 week, hx of stones, reports passing fragments but reports the pain is bad and he want to make sure everything is okay

## 2024-02-02 ENCOUNTER — Other Ambulatory Visit: Payer: Self-pay | Admitting: Urology

## 2024-02-11 NOTE — Patient Instructions (Signed)
 SURGICAL WAITING ROOM VISITATION Patients having surgery or a procedure may have no more than 2 support people in the waiting area - these visitors may rotate in the visitor waiting room.   If the patient needs to stay at the hospital during part of their recovery, the visitor guidelines for inpatient rooms apply.  PRE-OP VISITATION  Pre-op nurse will coordinate an appropriate time for 1 support person to accompany the patient in pre-op.  This support person may not rotate.  This visitor will be contacted when the time is appropriate for the visitor to come back in the pre-op area.  To keep our patients, visitors and teammates safe and prevent the spread of respiratory illnesses over the next few months.  Temporary Visitor Restrictions  Children ages 49 and under will not be able to visit patients in [facility] under most circumstances. Visitation is not restricted outside of hospitals unless noted otherwise in the [facility] and Location Specific Visitation Guidelines at:       [email or web address]  Visitors with respiratory illnesses are discouraged from visiting and should remain at home. You are not required to quarantine at this time prior to your surgery. However, you must do this: Hand Hygiene often Do NOT share personal items Notify your provider if you are in close contact with someone who has COVID or you develop fever 100.4 or greater, new onset of sneezing, cough, sore throat, shortness of breath or body aches.  If you test positive for Covid or have been in contact with anyone that has tested positive in the last 10 days please notify you surgeon.    Your procedure is scheduled on:  Friday  02-19-2024  Report to [facility] Main Entrance: [address] entrance where the [employer] is available.   Report to admitting at: 1:00 PM  Call this number if you have any questions or problems the morning of surgery [phone]  DO NOT EAT OR DRINK ANYTHING AFTER  MIDNIGHT THE NIGHT PRIOR TO YOUR SURGERY / PROCEDURE.   FOLLOW  ANY ADDITIONAL PRE OP INSTRUCTIONS YOU RECEIVED FROM YOUR SURGEON'S OFFICE!!!   Oral Hygiene is also important to reduce your risk of infection.        Remember - BRUSH YOUR TEETH THE MORNING OF SURGERY WITH YOUR REGULAR TOOTHPASTE  Do NOT smoke after Midnight the night before surgery.  STOP TAKING all Vitamins, Herbs and supplements 1 week before your surgery.   Take ONLY these medicines the morning of surgery with A SIP OF WATER :   none   You may not have any metal on your body including jewelry, and body piercing  Do not wear lotions, powders,cologne, or deodorant  Men may shave face and neck.  Contacts, Hearing Aids, dentures or bridgework may not be worn into surgery. DENTURES WILL BE REMOVED PRIOR TO SURGERY PLEASE DO NOT APPLY Poly grip OR ADHESIVES!!!  Patients discharged on the day of surgery will not be allowed to drive home.  Someone NEEDS to stay with you for the first 24 hours after anesthesia.  Do not bring your home medications to the hospital. The Pharmacy will dispense medications listed on your medication list to you during your admission in the Hospital.  Please read over the following fact sheets you were given: IF YOU HAVE QUESTIONS ABOUT YOUR PRE-OP INSTRUCTIONS, PLEASE CALL [phone].   [facility] - Preparing for Surgery         Before surgery, you can play an important role.  Because skin  is not sterile, your skin needs to be as free of germs as possible.  You can reduce the number of germs on your skin by washing with CHG (chlorahexidine gluconate) soap before surgery.  CHG is an antiseptic cleaner which kills germs and bonds with the skin to continue killing germs even after washing. Please DO NOT use if you have an allergy to CHG or antibacterial soaps.  If your skin becomes reddened/irritated stop using the CHG and inform your nurse when you arrive at Short Stay. Do not shave  (including legs and underarms) for at least 48 hours prior to the first CHG shower.  You may shave your face/neck.  Please follow these instructions carefully:  1.  Shower with CHG Soap the night before surgery ONLY (DO NOT USE THE CHG SOAP THE MORNING OF SURGERY).  2.  If you choose to wash your hair, wash your hair first as usual with your normal  shampoo.  3.  After you shampoo, rinse your hair and body thoroughly to remove the shampoo.                             4.  Use CHG as you would any other liquid soap.  You can apply chg directly to the skin and wash.  Gently with a scrungie or clean washcloth.  5.  Apply the CHG Soap to your body ONLY FROM THE NECK DOWN.   Do not use on face/ open                           Wound or open sores. Avoid contact with eyes, ears mouth and genitals (private parts).                       Wash face,  Genitals (private parts) with your normal soap.             6.  Wash thoroughly, paying special attention to the area where your  surgery  will be performed.  7.  Thoroughly rinse your body with warm water  from the neck down.  8.  DO NOT shower/wash with your normal soap after using and rinsing off the CHG Soap.                9.  Pat yourself dry with a clean towel.            10.  Wear clean pajamas.            11.  Place clean sheets on your bed the night of your first shower and do not  sleep with pets.  Day of Surgery : Do not apply any CHG, lotions/deodorants the morning of surgery.  Please wear clean clothes to the hospital/surgery center.   FAILURE TO FOLLOW THESE INSTRUCTIONS MAY RESULT IN THE CANCELLATION OF YOUR SURGERY  PATIENT SIGNATURE_________________________________  NURSE SIGNATURE__________________________________  ________________________________________________________________________

## 2024-02-11 NOTE — Progress Notes (Signed)
 Date of any COVID positive Test in last 90 days:  PCP - none Cardiologist -   Chest x-ray -  EKG -  2021  no hx to warrant repeat  Stress Test -  ECHO -  Cardiac Cath -  CT Coronary Calcium  score:  ZIO monitor-   Pacemaker / ICD device [x]  No []  Yes   Spinal Cord Stimulator:[x]  No []  Yes       History of Sleep Apnea? [x]  No []  Yes   CPAP used?- [x]  No []  Yes    Medication on DOS:  none  Patient has: [x]  NO Hx DM   []  Pre-DM   []  DM1  []   DM2 Does the patient monitor blood sugar?   [x]  N/A   []  No []  Yes   Blood Thinner / Instructions:none Aspirin Instructions:  none  Activity level: Able to walk up 2 flights of stairs without becoming significantly short of breath or having chest pain?   [x]    Yes   []  No,  would have:  Patient can perform ADLs without assistance.  [x]   Yes    []  No   Comments:   Anesthesia review: GERD, no other pertinent medical history  Patient denies any S&S of respiratory illness or Covid - no shortness of breath, fever, cough or chest pain at PAT appointment.  Patient verbalized understanding and agreement to the Pre-Surgical Instructions that were given to them at this PAT appointment. Patient was also educated of the need to review these PAT instructions again prior to his surgery.I reviewed the appropriate phone numbers to call if they have any and questions or concerns.

## 2024-02-12 ENCOUNTER — Encounter (HOSPITAL_COMMUNITY): Admission: RE | Admit: 2024-02-12

## 2024-02-12 ENCOUNTER — Other Ambulatory Visit: Payer: Self-pay

## 2024-02-12 ENCOUNTER — Encounter (HOSPITAL_COMMUNITY): Payer: Self-pay

## 2024-02-12 VITALS — BP 151/86 | HR 86 | Temp 98.0°F | Resp 20 | Ht 69.0 in | Wt 220.0 lb

## 2024-02-12 DIAGNOSIS — N200 Calculus of kidney: Secondary | ICD-10-CM

## 2024-02-12 DIAGNOSIS — Z01818 Encounter for other preprocedural examination: Secondary | ICD-10-CM

## 2024-02-12 LAB — CBC
HCT: 42.5 % (ref 39.0–52.0)
Hemoglobin: 14.7 g/dL (ref 13.0–17.0)
MCH: 34.3 pg — ABNORMAL HIGH (ref 26.0–34.0)
MCHC: 34.6 g/dL (ref 30.0–36.0)
MCV: 99.3 fL (ref 80.0–100.0)
Platelets: 412 10*3/uL — ABNORMAL HIGH (ref 150–400)
RBC: 4.28 MIL/uL (ref 4.22–5.81)
RDW: 11.6 % (ref 11.5–15.5)
WBC: 4 10*3/uL (ref 4.0–10.5)
nRBC: 0 % (ref 0.0–0.2)

## 2024-02-19 ENCOUNTER — Ambulatory Visit (HOSPITAL_COMMUNITY): Admit: 2024-02-19 | Admitting: Urology
